# Patient Record
Sex: Female | Born: 1940 | Race: White | Hispanic: No | Marital: Single | Smoking: Never smoker
Health system: Southern US, Community
[De-identification: ages and names within clinical notes are randomized; demographics above are authoritative.]

## PROBLEM LIST (undated history)

## (undated) ENCOUNTER — Emergency Department: Discharge status: Absent without leave | Payer: Medicare Other

## (undated) DIAGNOSIS — K219 Gastro-esophageal reflux disease without esophagitis: Secondary | ICD-10-CM

## (undated) DIAGNOSIS — E119 Type 2 diabetes mellitus without complications: Secondary | ICD-10-CM

## (undated) DIAGNOSIS — G4730 Sleep apnea, unspecified: Secondary | ICD-10-CM

## (undated) DIAGNOSIS — E785 Hyperlipidemia, unspecified: Secondary | ICD-10-CM

## (undated) DIAGNOSIS — I10 Essential (primary) hypertension: Secondary | ICD-10-CM

## (undated) DIAGNOSIS — K759 Inflammatory liver disease, unspecified: Secondary | ICD-10-CM

## (undated) DIAGNOSIS — F32A Depression, unspecified: Secondary | ICD-10-CM

## (undated) DIAGNOSIS — M353 Polymyalgia rheumatica: Secondary | ICD-10-CM

## (undated) DIAGNOSIS — F329 Major depressive disorder, single episode, unspecified: Secondary | ICD-10-CM

## (undated) DIAGNOSIS — M1990 Unspecified osteoarthritis, unspecified site: Secondary | ICD-10-CM

## (undated) DIAGNOSIS — N19 Unspecified kidney failure: Secondary | ICD-10-CM

## (undated) DIAGNOSIS — I639 Cerebral infarction, unspecified: Secondary | ICD-10-CM

## (undated) HISTORY — DX: Unspecified osteoarthritis, unspecified site: M19.90

## (undated) HISTORY — DX: Inflammatory liver disease, unspecified: K75.9

## (undated) HISTORY — DX: Sleep apnea, unspecified: G47.30

## (undated) HISTORY — DX: Major depressive disorder, single episode, unspecified: F32.9

## (undated) HISTORY — DX: Type 2 diabetes mellitus without complications: E11.9

## (undated) HISTORY — DX: Cerebral infarction, unspecified: I63.9

## (undated) HISTORY — DX: Essential (primary) hypertension: I10

## (undated) HISTORY — PX: BILATERAL SALPINGOOPHORECTOMY: SHX1223

## (undated) HISTORY — PX: TONSILLECTOMY AND ADENOIDECTOMY: SUR1326

## (undated) HISTORY — PX: ABDOMINAL HYSTERECTOMY: SHX81

## (undated) HISTORY — PX: ESOPHAGEAL DILATION: SHX303

## (undated) HISTORY — DX: Unspecified kidney failure: N19

## (undated) HISTORY — DX: Depression, unspecified: F32.A

## (undated) HISTORY — PX: OOPHORECTOMY: SHX86

## (undated) HISTORY — PX: REPLACEMENT TOTAL KNEE: SUR1224

---

## 2001-08-28 HISTORY — PX: REDUCTION MAMMAPLASTY: SUR839

## 2010-03-23 ENCOUNTER — Ambulatory Visit: Payer: Self-pay | Admitting: Gastroenterology

## 2010-05-04 ENCOUNTER — Ambulatory Visit: Payer: Self-pay | Admitting: Family Medicine

## 2010-05-28 ENCOUNTER — Ambulatory Visit: Payer: Self-pay | Admitting: Family Medicine

## 2010-07-14 ENCOUNTER — Ambulatory Visit: Payer: Self-pay

## 2010-07-28 ENCOUNTER — Ambulatory Visit: Payer: Self-pay

## 2010-08-28 ENCOUNTER — Ambulatory Visit: Payer: Self-pay

## 2010-09-14 ENCOUNTER — Ambulatory Visit: Payer: Self-pay | Admitting: General Practice

## 2010-09-16 ENCOUNTER — Ambulatory Visit: Payer: Self-pay | Admitting: Family Medicine

## 2010-09-23 ENCOUNTER — Ambulatory Visit: Payer: Self-pay | Admitting: Family Medicine

## 2010-09-29 ENCOUNTER — Ambulatory Visit: Payer: Self-pay | Admitting: Family Medicine

## 2010-10-06 ENCOUNTER — Ambulatory Visit: Payer: Self-pay | Admitting: Family Medicine

## 2010-10-27 ENCOUNTER — Ambulatory Visit: Payer: Self-pay | Admitting: Family Medicine

## 2010-12-04 ENCOUNTER — Emergency Department: Payer: Self-pay | Admitting: Unknown Physician Specialty

## 2011-05-16 ENCOUNTER — Encounter: Payer: Self-pay | Admitting: Orthopaedic Surgery

## 2011-05-30 ENCOUNTER — Encounter: Payer: Self-pay | Admitting: Orthopaedic Surgery

## 2011-06-07 ENCOUNTER — Ambulatory Visit: Payer: Self-pay | Admitting: Internal Medicine

## 2011-06-29 ENCOUNTER — Encounter: Payer: Self-pay | Admitting: Orthopaedic Surgery

## 2011-09-29 ENCOUNTER — Ambulatory Visit: Payer: Self-pay | Admitting: Gastroenterology

## 2011-10-10 ENCOUNTER — Ambulatory Visit: Payer: Self-pay | Admitting: Gastroenterology

## 2012-02-20 ENCOUNTER — Ambulatory Visit: Payer: Self-pay | Admitting: Gastroenterology

## 2012-03-22 ENCOUNTER — Ambulatory Visit: Payer: Self-pay | Admitting: Gastroenterology

## 2012-04-19 ENCOUNTER — Ambulatory Visit: Payer: Self-pay | Admitting: Orthopedic Surgery

## 2012-07-30 ENCOUNTER — Encounter: Payer: Self-pay | Admitting: Orthopedic Surgery

## 2012-08-26 ENCOUNTER — Ambulatory Visit: Payer: Self-pay | Admitting: Family Medicine

## 2012-08-27 ENCOUNTER — Emergency Department: Payer: Self-pay | Admitting: Emergency Medicine

## 2012-08-27 LAB — URINALYSIS, COMPLETE
Bacteria: NONE SEEN
Glucose,UR: NEGATIVE mg/dL (ref 0–75)
Hyaline Cast: 13
Ketone: NEGATIVE
Nitrite: POSITIVE
RBC,UR: 1 /HPF (ref 0–5)
Specific Gravity: 1.024 (ref 1.003–1.030)
WBC UR: 2 /HPF (ref 0–5)

## 2012-08-27 LAB — COMPREHENSIVE METABOLIC PANEL
Alkaline Phosphatase: 94 U/L (ref 50–136)
BUN: 42 mg/dL — ABNORMAL HIGH (ref 7–18)
Bilirubin,Total: 0.6 mg/dL (ref 0.2–1.0)
Calcium, Total: 9.9 mg/dL (ref 8.5–10.1)
Chloride: 102 mmol/L (ref 98–107)
Co2: 28 mmol/L (ref 21–32)
Creatinine: 2.13 mg/dL — ABNORMAL HIGH (ref 0.60–1.30)
EGFR (Non-African Amer.): 22 — ABNORMAL LOW
Osmolality: 290 (ref 275–301)
Potassium: 4.5 mmol/L (ref 3.5–5.1)
Sodium: 138 mmol/L (ref 136–145)

## 2012-08-27 LAB — CBC
HGB: 12.5 g/dL (ref 12.0–16.0)
MCH: 27.3 pg (ref 26.0–34.0)
MCHC: 32.7 g/dL (ref 32.0–36.0)
MCV: 84 fL (ref 80–100)
Platelet: 423 10*3/uL (ref 150–440)

## 2012-08-28 ENCOUNTER — Encounter: Payer: Self-pay | Admitting: Orthopedic Surgery

## 2012-10-01 ENCOUNTER — Encounter: Payer: Self-pay | Admitting: Orthopedic Surgery

## 2012-10-23 ENCOUNTER — Ambulatory Visit: Payer: Self-pay | Admitting: Family Medicine

## 2012-10-26 ENCOUNTER — Encounter: Payer: Self-pay | Admitting: Orthopedic Surgery

## 2012-11-09 ENCOUNTER — Emergency Department: Payer: Self-pay | Admitting: Emergency Medicine

## 2012-11-10 LAB — URINALYSIS, COMPLETE
Bacteria: NONE SEEN
Glucose,UR: NEGATIVE mg/dL (ref 0–75)
Ketone: NEGATIVE
Leukocyte Esterase: NEGATIVE
Nitrite: NEGATIVE
Protein: 30
Squamous Epithelial: 1
WBC UR: 2 /HPF (ref 0–5)

## 2012-11-10 LAB — COMPREHENSIVE METABOLIC PANEL
Alkaline Phosphatase: 84 U/L (ref 50–136)
Anion Gap: 8 (ref 7–16)
Bilirubin,Total: 0.5 mg/dL (ref 0.2–1.0)
Calcium, Total: 8.3 mg/dL — ABNORMAL LOW (ref 8.5–10.1)
Chloride: 99 mmol/L (ref 98–107)
Co2: 26 mmol/L (ref 21–32)
Creatinine: 1.4 mg/dL — ABNORMAL HIGH (ref 0.60–1.30)
EGFR (African American): 43 — ABNORMAL LOW
EGFR (Non-African Amer.): 37 — ABNORMAL LOW
Glucose: 203 mg/dL — ABNORMAL HIGH (ref 65–99)
Osmolality: 278 (ref 275–301)
SGOT(AST): 22 U/L (ref 15–37)
SGPT (ALT): 21 U/L (ref 12–78)
Sodium: 133 mmol/L — ABNORMAL LOW (ref 136–145)
Total Protein: 6.7 g/dL (ref 6.4–8.2)

## 2012-11-10 LAB — CBC
HCT: 32.3 % — ABNORMAL LOW (ref 35.0–47.0)
HGB: 10.8 g/dL — ABNORMAL LOW (ref 12.0–16.0)
MCH: 29.5 pg (ref 26.0–34.0)
MCHC: 33.4 g/dL (ref 32.0–36.0)
MCV: 88 fL (ref 80–100)
WBC: 8.4 10*3/uL (ref 3.6–11.0)

## 2012-11-10 LAB — CK TOTAL AND CKMB (NOT AT ARMC)
CK, Total: 259 U/L — ABNORMAL HIGH (ref 21–215)
CK-MB: 3.4 ng/mL (ref 0.5–3.6)

## 2012-12-27 ENCOUNTER — Ambulatory Visit: Payer: Self-pay | Admitting: Rheumatology

## 2013-04-09 ENCOUNTER — Ambulatory Visit: Payer: Self-pay | Admitting: Rheumatology

## 2013-05-21 ENCOUNTER — Ambulatory Visit: Payer: Self-pay | Admitting: Vascular Surgery

## 2014-03-06 ENCOUNTER — Ambulatory Visit: Payer: Self-pay | Admitting: Family Medicine

## 2014-03-16 ENCOUNTER — Ambulatory Visit: Payer: Self-pay | Admitting: Family Medicine

## 2014-05-25 ENCOUNTER — Ambulatory Visit: Payer: Self-pay | Admitting: Nephrology

## 2014-06-29 ENCOUNTER — Ambulatory Visit: Payer: Self-pay | Admitting: Gastroenterology

## 2014-10-23 ENCOUNTER — Ambulatory Visit: Payer: Self-pay | Admitting: Family Medicine

## 2014-12-11 ENCOUNTER — Other Ambulatory Visit: Payer: Self-pay | Admitting: Family Medicine

## 2014-12-11 DIAGNOSIS — N63 Unspecified lump in unspecified breast: Secondary | ICD-10-CM

## 2015-03-19 ENCOUNTER — Other Ambulatory Visit: Payer: Self-pay

## 2015-03-19 ENCOUNTER — Ambulatory Visit: Payer: Self-pay | Attending: Family Medicine

## 2015-05-11 ENCOUNTER — Other Ambulatory Visit: Payer: Self-pay | Admitting: Gastroenterology

## 2015-05-11 DIAGNOSIS — R197 Diarrhea, unspecified: Secondary | ICD-10-CM

## 2015-05-11 DIAGNOSIS — K529 Noninfective gastroenteritis and colitis, unspecified: Secondary | ICD-10-CM

## 2015-05-13 ENCOUNTER — Ambulatory Visit
Admission: RE | Admit: 2015-05-13 | Discharge: 2015-05-13 | Disposition: A | Discharge status: Home / Self Care | Payer: Medicare Other | Source: Ambulatory Visit | Attending: Gastroenterology | Admitting: Gastroenterology

## 2015-05-13 DIAGNOSIS — R194 Change in bowel habit: Secondary | ICD-10-CM | POA: Insufficient documentation

## 2015-05-13 DIAGNOSIS — K824 Cholesterolosis of gallbladder: Secondary | ICD-10-CM | POA: Diagnosis not present

## 2015-05-13 DIAGNOSIS — R197 Diarrhea, unspecified: Secondary | ICD-10-CM

## 2015-05-13 DIAGNOSIS — N281 Cyst of kidney, acquired: Secondary | ICD-10-CM | POA: Insufficient documentation

## 2015-07-23 ENCOUNTER — Emergency Department
Admission: EM | Admit: 2015-07-23 | Discharge: 2015-07-23 | Disposition: A | Discharge status: Home / Self Care | Payer: Medicare Other

## 2015-11-03 ENCOUNTER — Ambulatory Visit: Payer: Medicare Other | Attending: Neurology | Admitting: Physical Therapy

## 2015-11-03 ENCOUNTER — Encounter: Payer: Self-pay | Admitting: Physical Therapy

## 2015-11-03 DIAGNOSIS — R531 Weakness: Secondary | ICD-10-CM

## 2015-11-03 DIAGNOSIS — Z9181 History of falling: Secondary | ICD-10-CM | POA: Insufficient documentation

## 2015-11-03 DIAGNOSIS — R269 Unspecified abnormalities of gait and mobility: Secondary | ICD-10-CM | POA: Insufficient documentation

## 2015-11-04 ENCOUNTER — Encounter: Payer: Self-pay | Admitting: Physical Therapy

## 2015-11-04 NOTE — Therapy (Signed)
[place] MAIN [facility] SERVICES [place], [place], [address] Phone: [phone]   Fax:  [phone]  Physical Therapy Evaluation  Patient Details  Name: Holly Conner MRN: [id number] Date of Birth: 07/13/1941 Referring Provider: Gurney Maxin MD  Encounter Date: 11/03/2015      PT End of Session - 11/04/15 1745    Visit Number 1   Number of Visits 17   Date for PT Re-Evaluation 12/30/15   Authorization Type gcode 1   Authorization Time Period 10   PT Start Time 1610   PT Stop Time 1700   PT Time Calculation (min) 50 min   Activity Tolerance Patient tolerated treatment well   Behavior During Therapy [facility] for tasks assessed/performed      Past Medical History  Diagnosis Date  . Depression     controlled  . Hypertension     somewhat controlled  . 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 No pertinent past surgical history.  There were no vitals filed for this visit.  Visit Diagnosis:  Abnormality of gait - Plan: PT plan of care cert/re-cert  History of fall - Plan: PT plan of care cert/re-cert  Weakness - Plan: PT plan of care cert/re-cert      Subjective Assessment - 11/03/15 1620    Subjective 75 yo Female reports increased history of falls and shaking; She reports that her last fall occurred when her blood sugar dropped to 65 last friday (10/29/15); She reports that currently she is shaking but reports that her blood sugar was 135; Patient reports that her legs just give out and she falls down; She denies any numbness/tingling; She reports sometimes having dizziness; Patient is not using an assistive device at this time; She is concerned about using a cane;    Pertinent History personal factors affecting rehab: back pain chronic, kidney failure (stage IV);    Limitations Standing;Walking   How long can you sit  comfortably? 2+ hours   How long can you stand comfortably? 5 min due to back pain    How long can you walk comfortably? unsure, not long;    Patient Stated Goals was walking in the neighborhood and is wanting to get back to that; Be able to do self care ADLs without falling;    Currently in Pain? Yes   Pain Score 6    Pain Location Back   Pain Orientation Lower   Pain Descriptors / Indicators Aching;Sore   Pain Type Chronic pain   Aggravating Factors  standing/walking;    Pain Relieving Factors rest;             [facility] PT Assessment - 11/04/15 0001    Assessment   Medical Diagnosis Frequent falls/impaired balance   Referring Provider Gurney Maxin MD   Onset Date/Surgical Date 10/27/14   Hand Dominance Right   Next MD Visit not scheduled;    Prior Therapy has had PT in the past for past knee surgery; denies any PT for this current condition;    Precautions   Precautions Fall   Restrictions   Weight Bearing Restrictions No   Balance Screen   Has the patient fallen in the past 6 months Yes   How many times? 5   Has the patient had a decrease in activity level because of a fear of falling?  No   Is the patient reluctant to leave their home because of a fear of falling?  No   Home Environment   Additional Comments lives in single story home with 2 steps to enter; no railing ;holds onto doorway column; Patient reports living alone currently;    Prior Function   Level of Independence Independent;Independent with gait;Independent with transfers   Vocation Retired   Comptroller, shopping, spend time with friends;    Cognition   Overall Cognitive Status Within Functional Limits for tasks assessed   Observation/Other Assessments   Skin Integrity intact by gross assessment;    Sensation   Light Touch Appears Intact   Coordination   Gross Motor Movements are Fluid and Coordinated Yes   Fine Motor Movements are Fluid and Coordinated Yes   Posture/Postural Control    Posture Comments demonstrates mild slumped posture with unsupported sitting; able to self correct with verbal cues;    AROM   Overall AROM Comments BUE and BLE AROM is WFL;    Strength   Overall Strength Comments BUE grossly 4/5; BLE grossly 4/5   Transfers   Comments patient able to transfer sit<>stand without HHA but is slower with transfer;    Ambulation/Gait   Gait Comments ambulates without AD, wide base of support, decreased step length, decreased DF at heel strike increased lateral trunk sway with decreased pelvic rotation; slower gait speed;    Standardized Balance Assessment   Five times sit to stand comments  25 sec without HHA; >15 sec indicates high risk for falls;   10 Meter Walk 0.5 m/s without AD at comfortable pace; 0.76 m/s without AD at fast pace (limited home and community ambulator, at risk for falls)   [employer]   Sit to Stand Able to stand without using hands and stabilize independently   Standing Unsupported Able to stand 2 minutes with supervision   Sitting with Back Unsupported but Feet Supported on Floor or Stool Able to sit safely and securely 2 minutes   Stand to Sit Sits safely with minimal use of hands   Transfers Able to transfer safely, definite need of hands   Standing Unsupported with Eyes Closed Able to stand 10 seconds with supervision   Standing Ubsupported with Feet Together Able to place feet together independently and stand for 1 minute with supervision   From Standing, Reach Forward with Outstretched Arm Can reach forward >12 cm safely (5")   From Standing Position, Pick up Object from Floor Able to pick up shoe, needs supervision   From Standing Position, Turn to Look Behind Over each Shoulder Looks behind from both sides and weight shifts well   Turn 360 Degrees Able to turn 360 degrees safely but slowly   Standing Unsupported, Alternately Place Feet on Step/Stool Able to complete 4 steps without aid or supervision   Standing Unsupported,  One Foot in [employer] balance while stepping or standing   Standing on One Leg Tries to lift leg/unable to hold 3 seconds but remains standing independently   Total Score 39   Berg comment: 37-45 indicates significant risk for falls 80%                           PT Education - 11/04/15 1745    Education provided Yes   Education Details findings, recommendations for rehab   Person(s) Educated Patient   Methods Explanation   Comprehension Verbalized understanding  PT Long Term Goals - 11-12-15 1748    PT LONG TERM GOAL #1   Title Patient will be independent in home exercise program to improve strength/mobility for better functional independence with ADLs by 12/30/15   Time 8   Period Weeks   Status New   PT LONG TERM GOAL #2   Title Patient (> 14 years old) will complete five times sit to stand test in < 15 seconds indicating an increased LE strength and improved balance. by 12/30/15   Time 8   Period Weeks   Status New   PT LONG TERM GOAL #3   Title Patient will increase Berg Balance score by > 6 points to demonstrate decreased fall risk during functional activities. by 12/30/15   Time 8   Period Weeks   Status New   PT LONG TERM GOAL #4   Title Patient will increase 10 meter walk test to >1.82m/s as to improve gait speed for better community ambulation and to reduce fall risk. by 12/30/15   Time 8   Period Weeks   Status New   PT LONG TERM GOAL #5   Title Patient will increase BLE gross strength to 4+/5 as to improve functional strength for independent gait, increased standing tolerance and increased ADL ability. by 12/30/15   Time 8   Period Weeks   Status New               Plan - 2015/11/12 1746    Clinical Impression Statement 75 yo Female presents to therapy with increased history of falls. She reports having increased episodes of "shaking" with instability. Patient exhibits weakness in BUE/BLE with decreased mobility. She tested as a  high fall risk. Patient would benefit from additional skilled PT intervention to improve balance/gait safety and reduce risk for falls;    Pt will benefit from skilled therapeutic intervention in order to improve on the following deficits Abnormal gait;Decreased endurance;Obesity;Cardiopulmonary status limiting activity;Decreased activity tolerance;Decreased strength;Decreased knowledge of use of DME;Pain;Difficulty walking;Decreased mobility;Decreased balance;Decreased range of motion;Improper body mechanics;Postural dysfunction;Impaired flexibility;Decreased safety awareness   Rehab Potential Fair   Clinical Impairments Affecting Rehab Potential positive: Motivated; negative: chronic pain, co-morbidities etc; Patient's current presentation is evolving as she has episodes of shaking with multiple falls in last few months. She also has multiple co-morbidities which affect rehab tolerance including stage IV kidney failure, chronic back pain and uncontrolled diabetes;    PT Frequency 2x / week   PT Duration 8 weeks   PT Treatment/Interventions ADLs/Self Care Home Management;Cryotherapy;Electrical Stimulation;Moist Heat;Balance training;Therapeutic exercise;Therapeutic activities;Functional mobility training;Stair training;Gait training;DME Instruction;Neuromuscular re-education;Patient/family education;Manual techniques;Taping;Energy conservation   PT Next Visit Plan initiate balance HEP   PT Home Exercise Plan will initiate next visit;    Consulted and Agree with Plan of Care Patient          G-Codes - 11/12/15 1750    Functional Assessment Tool Used 10 meter walk, 5 times sit<>stand, strength, clinical judgement;    Functional Limitation Mobility: Walking and moving around   Mobility: Walking and Moving Around Current Status [phone]) At least 40 percent but less than 60 percent impaired, limited or restricted   Mobility: Walking and Moving Around Goal Status [phone]) At least 1 percent but less than  20 percent impaired, limited or restricted       Problem List There are no active problems to display for this patient.   Tachina Spoonemore PT, DPT 11-12-2015, 5:51 PM  [place] [facility]  [place], [place], [address] Phone: [phone]   Fax:  [phone]  Name: Holly Conner MRN: [id number] Date of Birth: 1941/08/04

## 2015-11-09 ENCOUNTER — Ambulatory Visit: Payer: Medicare Other | Admitting: Physical Therapy

## 2015-11-11 ENCOUNTER — Ambulatory Visit: Payer: Medicare Other | Admitting: Physical Therapy

## 2015-11-11 ENCOUNTER — Encounter: Payer: Medicare Other | Admitting: Occupational Therapy

## 2015-11-16 ENCOUNTER — Ambulatory Visit: Payer: Medicare Other | Admitting: Physical Therapy

## 2015-11-18 ENCOUNTER — Ambulatory Visit: Payer: Medicare Other | Admitting: Physical Therapy

## 2015-11-23 ENCOUNTER — Ambulatory Visit: Payer: Medicare Other | Admitting: Physical Therapy

## 2015-11-23 ENCOUNTER — Encounter: Payer: Self-pay | Admitting: Physical Therapy

## 2015-11-23 DIAGNOSIS — Z9181 History of falling: Secondary | ICD-10-CM

## 2015-11-23 DIAGNOSIS — R269 Unspecified abnormalities of gait and mobility: Secondary | ICD-10-CM | POA: Diagnosis not present

## 2015-11-23 DIAGNOSIS — R531 Weakness: Secondary | ICD-10-CM

## 2015-11-23 NOTE — Patient Instructions (Signed)
SIT TO STAND: No Device   Sit with feet shoulder-width apart, on floor.(Make sure that you are in a chair that won't move like a chair against a wall or couch etc) Lean chest forward, raise hips up from surface. Straighten hips and knees. Weight bear equally on left and right sides. 10___ reps per set, _2__ sets per day, _5__ days per week Place left leg closer to sitting surface.  Copyright  VHI. All rights reserved.  Backward Walking   Walk backward, toes of each foot coming down first. Take long, even strides. Make sure you have a clear pathway with no obstructions when you do this. Stand beside counter and walk backward  And then walk forward doing opposite directions; repeat 10 laps 2x a day at least 5 days a week.  Copyright  VHI. All rights reserved.  Tandem Walking   Stand beside kitchen sink and place one foot in front of the other, lift your hand and try to hold position for 10 sec. Repeat with other foot in front; Repeat 5 reps with each foot in front 5 days a week.Balance: Unilateral   Attempt to balance on left leg, eyes open. Hold _5-10___ seconds.Start with holding onto counter and if you get your balance you can try to let go of counter. Repeat __5__ times per set. Do __1__ sets per session. Do __1__ sessions per day. Keep eyes open:   http://orth.exer.us/29   Copyright  VHI. All rights reserved.

## 2015-11-24 NOTE — Therapy (Addendum)
[place] MAIN [facility] SERVICES [address] [place], [place], [address] Phone: [phone]   Fax:  [phone]  Physical Therapy Treatment  Patient Details  Name: Holly Conner MRN: [id number] Date of Birth: 1941-08-24 Referring Provider: Gurney Maxin MD  Encounter Date: 11/23/2015      PT End of Session - 11/23/15 1528    Visit Number 2   Number of Visits 17   Date for PT Re-Evaluation 12/30/15   Authorization Type gcode 2   Authorization Time Period 10   PT Start Time 1515   PT Stop Time 1600   PT Time Calculation (min) 45 min   Equipment Utilized During Treatment Gait belt   Activity Tolerance Patient tolerated treatment well   Behavior During Therapy [facility] for tasks assessed/performed      Past Medical History  Diagnosis Date  . Depression     controlled  . Hypertension     somewhat controlled  . 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 No pertinent past surgical history.  There were no vitals filed for this visit.  Visit Diagnosis:  Abnormality of gait  History of fall  Weakness      Subjective Assessment - 11/23/15 1526    Subjective Patient reports having increased back pain with initial waking and walking in the morning. She presents to therapy with SPC needing adjustment for safety;    Pertinent History personal factors affecting rehab: back pain chronic, kidney failure (stage IV);    Limitations Standing;Walking   How long can you sit comfortably? 2+ hours   How long can you stand comfortably? 5 min due to back pain    How long can you walk comfortably? unsure, not long;    Patient Stated Goals was walking in the neighborhood and is wanting to get back to that; Be able to do self care ADLs without falling;    Currently in Pain? Yes   Pain Score 7    Pain Location Back   Pain Orientation Lower   Pain  Descriptors / Indicators Aching;Sore   Pain Type Chronic pain         TREATMENT: Warm up on Nustep BUE/BLE level 2 x5 min (Unbilled);  PT initiated HEP: SLS on firm surface with 1-0 rail assist 10 sec hold x3 each LE; Tandem stance on firm surface, 1-0 rail assist 10 sec hold x4 each foot in front; Forward/backward walking without rail assist x10 feet x3 laps; Sit<>Stand from regular chair without rail assist x10 reps;  Patient required min VCs for balance stability, including to increase trunk control for less loss of balance with smaller base of support PT adjusted patient's SPC for better height to improve gait safety; Patient required mod VCs to increase erect posture for better gaze stabilization with balance tasks.  Resisted gait, 12.5# forward/backward walking x2 laps, side stepping 12.5# x2 laps with min A for balance and mod VCs to increase step length for better gait safety; Also required min VCs to improve weight shift to increase balance control;                         PT Education - 11/23/15 1527    Education provided Yes   Education Details HEP initiated, balance exercise   Person(s) Educated Patient   Methods Explanation;Verbal cues  Comprehension Verbalized understanding;Returned demonstration;Verbal cues required             PT Long Term Goals - 11/04/15 1748    PT LONG TERM GOAL #1   Title Patient will be independent in home exercise program to improve strength/mobility for better functional independence with ADLs by 12/30/15   Time 8   Period Weeks   Status New   PT LONG TERM GOAL #2   Title Patient (> 29 years old) will complete five times sit to stand test in < 15 seconds indicating an increased LE strength and improved balance. by 12/30/15   Time 8   Period Weeks   Status New   PT LONG TERM GOAL #3   Title Patient will increase Berg Balance score by > 6 points to demonstrate decreased fall risk during functional activities. by  12/30/15   Time 8   Period Weeks   Status New   PT LONG TERM GOAL #4   Title Patient will increase 10 meter walk test to >1.43m/s as to improve gait speed for better community ambulation and to reduce fall risk. by 12/30/15   Time 8   Period Weeks   Status New   PT LONG TERM GOAL #5   Title Patient will increase BLE gross strength to 4+/5 as to improve functional strength for independent gait, increased standing tolerance and increased ADL ability. by 12/30/15   Time 8   Period Weeks   Status New               Plan - 11/24/15 1632    Clinical Impression Statement Instructed patient in advanced balance exercise. She was able to demonstrate better stance control with cues for better posture and abdominal stabilization. Patient continues to have decreased stability with higher level balance tasks. Would benefit from additional skilled PT Intervention to improve dynamic balance and reduce fall risk; Patient is concerned about chronic back pain; PT informed patient she would need a referral to address back discomfort;    Pt will benefit from skilled therapeutic intervention in order to improve on the following deficits Abnormal gait;Decreased endurance;Obesity;Cardiopulmonary status limiting activity;Decreased activity tolerance;Decreased strength;Decreased knowledge of use of DME;Pain;Difficulty walking;Decreased mobility;Decreased balance;Decreased range of motion;Improper body mechanics;Postural dysfunction;Impaired flexibility;Decreased safety awareness   Rehab Potential Fair   Clinical Impairments Affecting Rehab Potential positive: Motivated; negative: chronic pain, co-morbidities etc; Patient's current presentation is evolving as she has episodes of shaking with multiple falls in last few months. She also has multiple co-morbidities which affect rehab tolerance including stage IV kidney failure, chronic back pain and uncontrolled diabetes;    PT Frequency 2x / week   PT Duration 8 weeks    PT Treatment/Interventions ADLs/Self Care Home Management;Cryotherapy;Electrical Stimulation;Moist Heat;Balance training;Therapeutic exercise;Therapeutic activities;Functional mobility training;Stair training;Gait training;DME Instruction;Neuromuscular re-education;Patient/family education;Manual techniques;Taping;Energy conservation   PT Next Visit Plan initiate balance HEP   PT Home Exercise Plan will initiate next visit;    Consulted and Agree with Plan of Care Patient        Problem List There are no active problems to display for this patient.   Latecia Miler PT, DPT 11/24/2015, 5:41 PM  [place] MAIN [facility] SERVICES [address] [place], [place], [address] Phone: [phone]   Fax:  [phone]  Name: Holly Conner MRN: [id number] Date of Birth: 06/12/1941

## 2015-11-25 ENCOUNTER — Encounter: Payer: Self-pay | Admitting: Physical Therapy

## 2015-11-25 ENCOUNTER — Ambulatory Visit: Payer: Medicare Other | Admitting: Physical Therapy

## 2015-11-25 DIAGNOSIS — Z9181 History of falling: Secondary | ICD-10-CM

## 2015-11-25 DIAGNOSIS — R269 Unspecified abnormalities of gait and mobility: Secondary | ICD-10-CM | POA: Diagnosis not present

## 2015-11-25 DIAGNOSIS — R531 Weakness: Secondary | ICD-10-CM

## 2015-11-25 NOTE — Therapy (Signed)
[place] MAIN [facility] SERVICES [address] [place], [place], [address] Phone: [phone]   Fax:  [phone]  Physical Therapy Treatment  Patient Details  Name: Holly Conner MRN: [id number] Date of Birth: 06-27-1941 Referring Provider: Gurney Maxin MD  Encounter Date: 11/25/2015      PT End of Session - 11/25/15 1700    Visit Number 3   Number of Visits 17   Date for PT Re-Evaluation 12/30/15   Authorization Type gcode [id number]   Authorization Time Period 10   PT Start Time 1535   PT Stop Time 1615   PT Time Calculation (min) 40 min   Equipment Utilized During Treatment Gait belt   Activity Tolerance Patient tolerated treatment well   Behavior During Therapy [facility] for tasks assessed/performed      Past Medical History  Diagnosis Date  . Depression     controlled  . Hypertension     somewhat controlled  . 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 No pertinent past surgical history.  There were no vitals filed for this visit.  Visit Diagnosis:  Abnormality of gait  History of fall  Weakness      Subjective Assessment - 11/25/15 1542    Subjective Patient was late to appointment; "I am sorry. I sold my car and I had to go by the bank and its just been a busy afternoon." Patient reports having a little soreness in back, legs and arms but no severe pain; She reports doing her HEP but had difficulty with tandem stance;    Pertinent History personal factors affecting rehab: back pain chronic, kidney failure (stage IV);    Limitations Standing;Walking   How long can you sit comfortably? 2+ hours   How long can you stand comfortably? 5 min due to back pain    How long can you walk comfortably? unsure, not long;    Patient Stated Goals was walking in the neighborhood and is wanting to get back to that; Be able to do self care  ADLs without falling;    Currently in Pain? Yes   Pain Score 2    Pain Location Back   Pain Orientation Lower   Pain Descriptors / Indicators Aching;Sore   Pain Type Chronic pain         TREATMENT:  Warm up on Nustep BUE/BLE level 2 x4 min (unbilled);  Instructed patient in extensive gait training: Gait around gym with [place] in [place] with cues to improve 2 point gait pattern x200 feet; Patient had difficulty with RUE timing despite mod-max verbal/tactile cues; Gait with BUE holding canes, and PT behind patient working on timing UE arm swing with foot step x200 feet; Patient reports not being able to correctly initiate arm swing with LE step; Gait with BUE holding walking poles x200 feet with cues to improve opposite UE movement with opposite LE; Patient had difficulty with timing UE movement with steps; Patient is quicker with LE step and slower with UE movement; Gait with RUE holding SPC, x200 feet with max Verbal cues for cane placement and sequencing; She was able to demonstrate partial correct sequencing approximately 50% of time requiring frequent cues to stop and start over as patient would often take 3 steps prior to moving cane due to slower UE movement against faster steps; PT educated patient in ways  to improve gait sequencing at home including to slow down LE movement, increase talking out load for verbal cues;  Patient would benefit from additional skilled PT Intervention to improve gait safety and balance;                          PT Education - 11/25/15 1700    Education provided Yes   Education Details gait safety   Person(s) Educated Patient   Methods Explanation;Verbal cues;Tactile cues   Comprehension Verbalized understanding;Returned demonstration;Verbal cues required;Tactile cues required             PT Long Term Goals - 11/04/15 1748    PT LONG TERM GOAL #1   Title Patient will be independent in home exercise program to improve  strength/mobility for better functional independence with ADLs by 12/30/15   Time 8   Period Weeks   Status New   PT LONG TERM GOAL #2   Title Patient (> 26 years old) will complete five times sit to stand test in < 15 seconds indicating an increased LE strength and improved balance. by 12/30/15   Time 8   Period Weeks   Status New   PT LONG TERM GOAL #3   Title Patient will increase Berg Balance score by > 6 points to demonstrate decreased fall risk during functional activities. by 12/30/15   Time 8   Period Weeks   Status New   PT LONG TERM GOAL #4   Title Patient will increase 10 meter walk test to >1.48m/s as to improve gait speed for better community ambulation and to reduce fall risk. by 12/30/15   Time 8   Period Weeks   Status New   PT LONG TERM GOAL #5   Title Patient will increase BLE gross strength to 4+/5 as to improve functional strength for independent gait, increased standing tolerance and increased ADL ability. by 12/30/15   Time 8   Period Weeks   Status New               Plan - 11/25/15 1700    Clinical Impression Statement Instructed patient in gait safety with SPC; She required max VCs and tactile cues for sequencing and safe cane use. Patient had difficulty with timing of cane for 2 point gait pattern and would benefit from additional skilled PT intervention to improve gait safety and reduce fall risk;    Pt will benefit from skilled therapeutic intervention in order to improve on the following deficits Abnormal gait;Decreased endurance;Obesity;Cardiopulmonary status limiting activity;Decreased activity tolerance;Decreased strength;Decreased knowledge of use of DME;Pain;Difficulty walking;Decreased mobility;Decreased balance;Decreased range of motion;Improper body mechanics;Postural dysfunction;Impaired flexibility;Decreased safety awareness   Rehab Potential Fair   Clinical Impairments Affecting Rehab Potential positive: Motivated; negative: chronic pain,  co-morbidities etc; Patient's current presentation is evolving as she has episodes of shaking with multiple falls in last few months. She also has multiple co-morbidities which affect rehab tolerance including stage IV kidney failure, chronic back pain and uncontrolled diabetes;    PT Frequency 2x / week   PT Duration 8 weeks   PT Treatment/Interventions ADLs/Self Care Home Management;Cryotherapy;Electrical Stimulation;Moist Heat;Balance training;Therapeutic exercise;Therapeutic activities;Functional mobility training;Stair training;Gait training;DME Instruction;Neuromuscular re-education;Patient/family education;Manual techniques;Taping;Energy conservation   PT Next Visit Plan initiate balance HEP   PT Home Exercise Plan educated patient in gait training;    Consulted and Agree with Plan of Care Patient        Problem List There are no active problems to  display for this patient.   Sherrill Mckamie PT, DPT 11/25/2015, 5:02 PM  [place] MAIN [facility] SERVICES [address] [place], [place], [address] Phone: [phone]   Fax:  [phone]  Name: Holly Conner MRN: [id number] Date of Birth: 03-Sep-1940

## 2015-11-30 ENCOUNTER — Ambulatory Visit: Payer: Medicare Other | Admitting: Physical Therapy

## 2015-12-02 ENCOUNTER — Encounter: Payer: Self-pay | Admitting: Physical Therapy

## 2015-12-02 ENCOUNTER — Ambulatory Visit: Payer: Medicare Other | Attending: Neurology | Admitting: Physical Therapy

## 2015-12-02 DIAGNOSIS — R2681 Unsteadiness on feet: Secondary | ICD-10-CM | POA: Insufficient documentation

## 2015-12-02 DIAGNOSIS — M6281 Muscle weakness (generalized): Secondary | ICD-10-CM | POA: Insufficient documentation

## 2015-12-02 DIAGNOSIS — R269 Unspecified abnormalities of gait and mobility: Secondary | ICD-10-CM | POA: Diagnosis present

## 2015-12-02 DIAGNOSIS — Z9181 History of falling: Secondary | ICD-10-CM | POA: Insufficient documentation

## 2015-12-02 DIAGNOSIS — R531 Weakness: Secondary | ICD-10-CM | POA: Insufficient documentation

## 2015-12-02 NOTE — Therapy (Signed)
[place] MAIN [facility] SERVICES [address] [place], [place], [address] Phone: [phone]   Fax:  [phone]  Physical Therapy Treatment  Patient Details  Name: Holly Conner MRN: [id number] Date of Birth: Aug 30, 1940 Referring Provider: Gurney Maxin MD  Encounter Date: 12/02/2015      PT End of Session - 12/02/15 1502    Visit Number 4   Number of Visits 17   Date for PT Re-Evaluation 12/30/15   Authorization Type gcode 4   Authorization Time Period 10   PT Start Time 1430   PT Stop Time 1515   PT Time Calculation (min) 45 min   Equipment Utilized During Treatment Gait belt   Activity Tolerance Patient tolerated treatment well   Behavior During Therapy [facility] for tasks assessed/performed      Past Medical History  Diagnosis Date  . Depression     controlled  . Hypertension     somewhat controlled  . 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 No pertinent past surgical history.  There were no vitals filed for this visit.  Visit Diagnosis:  Abnormality of gait  Weakness  History of fall      Subjective Assessment - 12/02/15 1437    Subjective Patient reports having a history of UE tremors and lots of falls; She reports having increased tremors when putting one foot in front of the other; She is concerned about her low blood sugar and plans to talk with her endocrinologist about it, but she is also concerned about her shaking and lack of coordination;    Pertinent History personal factors affecting rehab: back pain chronic, kidney failure (stage IV);    Limitations Standing;Walking   How long can you sit comfortably? 2+ hours   How long can you stand comfortably? 5 min due to back pain    How long can you walk comfortably? unsure, not long;    Patient Stated Goals was walking in the neighborhood and is wanting to get  back to that; Be able to do self care ADLs without falling;    Currently in Pain? No/denies         TREATMENT: Warm up on Nustep BUE/BLE level 2 x3 min (unbilled);  Re-educated patient in HEP and plan of care;  Tandem stance without rail assist on firm surface 10 sec hold x2 each foot in front; Modified tandem stance with heel to toe but feet not together 10 sec hold x1 each foot in front; SLS on firm surface without rail assist 5 sec hold x3 each LE;  Patient required min VCs for balance stability, including to increase trunk control for less loss of balance with smaller base of support  Standing on airex x2: Feet apart, eyes open/closed 10 sec hold x3 each with cues for weight shift; Feet apart, reaching for ball with BUE side/side ball pass x10 each;  Resisted weighted gait 7.5# forward/backward, side/side 2 way, x2 laps each with min A for balance; She required min Vcs to improve weight shift with eccentric return for increased stance control;                          PT Education - 12/02/15 1502    Education provided Yes   Education Details balance, weight shift;    Person(s) Educated Patient  Methods Explanation;Verbal cues   Comprehension Verbalized understanding;Returned demonstration;Verbal cues required             PT Long Term Goals - 11/04/15 1748    PT LONG TERM GOAL #1   Title Patient will be independent in home exercise program to improve strength/mobility for better functional independence with ADLs by 12/30/15   Time 8   Period Weeks   Status New   PT LONG TERM GOAL #2   Title Patient (> 42 years old) will complete five times sit to stand test in < 15 seconds indicating an increased LE strength and improved balance. by 12/30/15   Time 8   Period Weeks   Status New   PT LONG TERM GOAL #3   Title Patient will increase Berg Balance score by > 6 points to demonstrate decreased fall risk during functional activities. by 12/30/15   Time 8    Period Weeks   Status New   PT LONG TERM GOAL #4   Title Patient will increase 10 meter walk test to >1.22m/s as to improve gait speed for better community ambulation and to reduce fall risk. by 12/30/15   Time 8   Period Weeks   Status New   PT LONG TERM GOAL #5   Title Patient will increase BLE gross strength to 4+/5 as to improve functional strength for independent gait, increased standing tolerance and increased ADL ability. by 12/30/15   Time 8   Period Weeks   Status New               Plan - 12/02/15 1712    Clinical Impression Statement Re-educated patient in balance exercise as part of HEP; Instructed patient in advanced balance exercise on uneven surfaces; She does require min Vcs to improve weight shift for better balance control and to increase erect posture. She would benefit from additional skilled PT intervention to improve LE strength, balance and gait safety;    Pt will benefit from skilled therapeutic intervention in order to improve on the following deficits Abnormal gait;Decreased endurance;Obesity;Cardiopulmonary status limiting activity;Decreased activity tolerance;Decreased strength;Decreased knowledge of use of DME;Pain;Difficulty walking;Decreased mobility;Decreased balance;Decreased range of motion;Improper body mechanics;Postural dysfunction;Impaired flexibility;Decreased safety awareness   Rehab Potential Fair   Clinical Impairments Affecting Rehab Potential positive: Motivated; negative: chronic pain, co-morbidities etc; Patient's current presentation is evolving as she has episodes of shaking with multiple falls in last few months. She also has multiple co-morbidities which affect rehab tolerance including stage IV kidney failure, chronic back pain and uncontrolled diabetes;    PT Frequency 2x / week   PT Duration 8 weeks   PT Treatment/Interventions ADLs/Self Care Home Management;Cryotherapy;Electrical Stimulation;Moist Heat;Balance training;Therapeutic  exercise;Therapeutic activities;Functional mobility training;Stair training;Gait training;DME Instruction;Neuromuscular re-education;Patient/family education;Manual techniques;Taping;Energy conservation   PT Next Visit Plan initiate balance HEP   PT Home Exercise Plan educated patient in gait training;    Consulted and Agree with Plan of Care Patient        Problem List There are no active problems to display for this patient.   Trotter,Margaret PT, DPT 12/02/2015, 5:13 PM  [place] MAIN [facility] SERVICES [address] [place], [place], [address] Phone: [phone]   Fax:  [phone]  Name: Holly Conner MRN: [id number] Date of Birth: [birth year]

## 2015-12-07 ENCOUNTER — Ambulatory Visit: Payer: Medicare Other | Admitting: Physical Therapy

## 2015-12-09 ENCOUNTER — Ambulatory Visit: Payer: Medicare Other | Admitting: Physical Therapy

## 2015-12-14 ENCOUNTER — Ambulatory Visit: Payer: Medicare Other | Admitting: Physical Therapy

## 2015-12-14 ENCOUNTER — Encounter: Payer: Self-pay | Admitting: Physical Therapy

## 2015-12-14 DIAGNOSIS — R2681 Unsteadiness on feet: Secondary | ICD-10-CM

## 2015-12-14 DIAGNOSIS — R269 Unspecified abnormalities of gait and mobility: Secondary | ICD-10-CM | POA: Diagnosis not present

## 2015-12-14 DIAGNOSIS — M6281 Muscle weakness (generalized): Secondary | ICD-10-CM

## 2015-12-14 NOTE — Therapy (Signed)
[place] MAIN [facility] SERVICES [address] [place], [place], [address] Phone: [phone]   Fax:  [phone]  Physical Therapy Treatment  Patient Details  Name: Holly Conner MRN: [id number] Date of Birth: 02-02-1941 Referring Provider: Gurney Maxin MD  Encounter Date: 12/14/2015      PT End of Session - 12/14/15 1452    Visit Number 5   Number of Visits 17   Date for PT Re-Evaluation 01/11/16   Authorization Type gcode 5   Authorization Time Period 10   PT Start Time 1430   PT Stop Time 1515   PT Time Calculation (min) 45 min   Equipment Utilized During Treatment Gait belt   Activity Tolerance Patient tolerated treatment well   Behavior During Therapy [facility] for tasks assessed/performed      Past Medical History  Diagnosis Date  . Depression     controlled  . Hypertension     somewhat controlled  . 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 No pertinent past surgical history.  There were no vitals filed for this visit.      Subjective Assessment - 12/14/15 1442    Subjective Patient reports doing okay; she reports seeing her endocrinologist and reports getting her insulin dose adjusted to help reduce the blood sugar drop after breakfast. Patient reports increased episodes of shakiness;    Pertinent History personal factors affecting rehab: back pain chronic, kidney failure (stage IV);    Limitations Standing;Walking   How long can you sit comfortably? 2+ hours   How long can you stand comfortably? 5 min due to back pain    How long can you walk comfortably? unsure, not long;    Patient Stated Goals was walking in the neighborhood and is wanting to get back to that; Be able to do self care ADLs without falling;    Currently in Pain? No/denies         TREATMENT: Warm up on treadmill 1.5 mph with 2 HHA x3 min with  cues to increase step length and increase erect posture for better gait safety;  Leg press, BLE plate 105# [id number]; BLE leg press, heel raises 105# 2x12; Patient required min VCs to slow down LE movement particularly eccentric return and to keep knees straight during heel raises for better calf strengthening;   Resisted weighted gait 12.5# backward with forward eccentric return, side/side 2 way, x2 laps each with min A for balance; She required min Vcs to improve weight shift with eccentric return for increased stance control;   Standing on airex: Eyes open/closed standing unsupported 10 sec hold x3 each; Standing with feet apart, eyes closed, head turns up/down, side/side x5 each with mod VCs to improve erect posture and avoid leaning side/side; Standing with feet apart, eyes open with BUE ball pass side/side x10 each;  SLS on firm surface 5-10 sec hold x2 each LE;  Patient required min VCs for balance stability, including to increase trunk control for less loss of balance with smaller base of support                             PT Education - 12/14/15 1452    Education provided Yes   Education Details strengthening, balance exercise   Person(s) Educated Patient   Methods Explanation;Verbal cues  Comprehension Verbalized understanding;Returned demonstration;Verbal cues required             PT Long Term Goals - 12/14/15 1516    PT LONG TERM GOAL #1   Title Patient will be independent in home exercise program to improve strength/mobility for better functional independence with ADLs by 12/30/15   Time 8   Period Weeks   Status On-going   PT LONG TERM GOAL #2   Title Patient (> 77 years old) will complete five times sit to stand test in < 15 seconds indicating an increased LE strength and improved balance. by 12/30/15   Time 8   Period Weeks   Status On-going   PT LONG TERM GOAL #3   Title Patient will increase Berg Balance score by > 6 points to  demonstrate decreased fall risk during functional activities. by 12/30/15   Time 8   Period Weeks   Status On-going   PT LONG TERM GOAL #4   Title Patient will increase 10 meter walk test to >1.37m/s as to improve gait speed for better community ambulation and to reduce fall risk. by 12/30/15   Time 8   Period Weeks   Status On-going   PT LONG TERM GOAL #5   Title Patient will increase BLE gross strength to 4+/5 as to improve functional strength for independent gait, increased standing tolerance and increased ADL ability. by 12/30/15   Time 8   Period Weeks   Status On-going               Plan - 12/14/15 1515    Clinical Impression Statement Instructed patient in balance and LE strengthening exercise. She required min VCs for correct exercise technique including to improve weight shift and posture with balance exercise. Patient did get a little "shaky" towards end of session which could be related to fatigue and/or fear of falling with advanced exercise. She would benefit from additional skilled PT Intervention to improve balance/gait safety.    Rehab Potential Fair   Clinical Impairments Affecting Rehab Potential positive: Motivated; negative: chronic pain, co-morbidities etc; Patient's current presentation is evolving as she has episodes of shaking with multiple falls in last few months. She also has multiple co-morbidities which affect rehab tolerance including stage IV kidney failure, chronic back pain and uncontrolled diabetes;    PT Frequency 2x / week   PT Duration 4 weeks   PT Treatment/Interventions ADLs/Self Care Home Management;Cryotherapy;Electrical Stimulation;Moist Heat;Balance training;Therapeutic exercise;Therapeutic activities;Functional mobility training;Stair training;Gait training;DME Instruction;Neuromuscular re-education;Patient/family education;Manual techniques;Taping;Energy conservation   PT Next Visit Plan initiate balance HEP   PT Home Exercise Plan continue as  previously given;    Consulted and Agree with Plan of Care Patient      Patient will benefit from skilled therapeutic intervention in order to improve the following deficits and impairments:  Abnormal gait, Decreased endurance, Obesity, Cardiopulmonary status limiting activity, Decreased activity tolerance, Decreased strength, Decreased knowledge of use of DME, Pain, Difficulty walking, Decreased mobility, Decreased balance, Decreased range of motion, Improper body mechanics, Postural dysfunction, Impaired flexibility, Decreased safety awareness  Visit Diagnosis: Unsteadiness on feet - Plan: PT plan of care cert/re-cert  Muscle weakness (generalized) - Plan: PT plan of care cert/re-cert     Problem List There are no active problems to display for this patient.   Yuriel Lopezmartinez PT, DPT 12/14/2015, 3:19 PM  [place] MAIN [facility] SERVICES [address] [place], [place], [address] Phone: [phone]   Fax:  [phone]  Name:  Holly Conner MRN: [id number] Date of Birth: [birth year]

## 2015-12-16 ENCOUNTER — Encounter: Payer: Self-pay | Admitting: Physical Therapy

## 2015-12-16 ENCOUNTER — Ambulatory Visit: Payer: Medicare Other | Admitting: Physical Therapy

## 2015-12-16 DIAGNOSIS — M6281 Muscle weakness (generalized): Secondary | ICD-10-CM

## 2015-12-16 DIAGNOSIS — R2681 Unsteadiness on feet: Secondary | ICD-10-CM

## 2015-12-16 NOTE — Therapy (Signed)
[place] MAIN [facility] SERVICES [address] [place], [place], [address] Phone: [phone]   Fax:  [phone]  Physical Therapy Treatment  Patient Details  Name: Holly Conner MRN: [id number] Date of Birth: April 12, 1941 Referring Provider: Gurney Maxin MD  Encounter Date: 12/16/2015      PT End of Session - 12/16/15 1439    Visit Number 6   Number of Visits 17   Date for PT Re-Evaluation 01/11/16   Authorization Type gcode 6   Authorization Time Period 10   PT Start Time 1430   PT Stop Time 1500   PT Time Calculation (min) 30 min   Equipment Utilized During Treatment --   Activity Tolerance Treatment limited secondary to medical complications (Comment)   Behavior During Therapy Anxious      Past Medical History  Diagnosis Date  . Depression     controlled  . Hypertension     somewhat controlled  . 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 No pertinent past surgical history.  There were no vitals filed for this visit.      Subjective Assessment - 12/16/15 1436    Subjective Patient reports having a hard time today; She reports having low blood sugar this morning with increased shaking; Patient reports having to use a BP cuff monitor and reports having increased pain in arm with the squeezing having increased difficulty disconnecting the machine;    Pertinent History personal factors affecting rehab: back pain chronic, kidney failure (stage IV);    Limitations Standing;Walking   How long can you sit comfortably? 2+ hours   How long can you stand comfortably? 5 min due to back pain    How long can you walk comfortably? unsure, not long;    Patient Stated Goals was walking in the neighborhood and is wanting to get back to that; Be able to do self care ADLs without falling;    Currently in Pain? No/denies        TREATMENT: Warm up on Nustep BUE/BLE level 2 x4 min (unbilled);  Attempted cross over step with resisted weighted gait; Patient unable to cross foot in front unsupported; Standing behind counter, holding on with both hands, cross over step x10 feet with cross in front and behind, close supervision;  Patient became increasingly shaky; Patient reports fearing that her blood sugar was low; PT assessed patient's blood sugar which was 336. Due to patient being symptomatic and feeling tired/weak and high blood sugar, PT recommended that we stop PT today. Will continue to monitor blood sugar during subsequent sessions;                           PT Education - 12/16/15 1438    Education provided Yes   Education Details balance exercise; sugar levels   Person(s) Educated Patient   Methods Explanation;Verbal cues   Comprehension Verbalized understanding;Returned demonstration;Verbal cues required             PT Long Term Goals - 12/14/15 1516    PT LONG TERM GOAL #1   Title Patient will be independent in home exercise program to improve strength/mobility for better functional independence with ADLs by 12/30/15   Time 8   Period Weeks   Status On-going   PT LONG TERM GOAL #2  Title Patient (> 55 years old) will complete five times sit to stand test in < 15 seconds indicating an increased LE strength and improved balance. by 12/30/15   Time 8   Period Weeks   Status On-going   PT LONG TERM GOAL #3   Title Patient will increase Berg Balance score by > 6 points to demonstrate decreased fall risk during functional activities. by 12/30/15   Time 8   Period Weeks   Status On-going   PT LONG TERM GOAL #4   Title Patient will increase 10 meter walk test to >1.37m/s as to improve gait speed for better community ambulation and to reduce fall risk. by 12/30/15   Time 8   Period Weeks   Status On-going   PT LONG TERM GOAL #5   Title Patient will increase BLE gross strength to 4+/5  as to improve functional strength for independent gait, increased standing tolerance and increased ADL ability. by 12/30/15   Time 8   Period Weeks   Status On-going               Plan - 12/16/15 1439    Clinical Impression Statement Patient instructed in balance exercise. While performing advanced task of cross over step, patient became shaky. She expressed concern over blood sugar. PT assessed blood sugar which was high at 336. Patient was feeling weak/tired and due to being symptomatic PT recommended that we stop session; Patient agreeable. Will continue to monitor blood sugar at subsequent sessions;    Rehab Potential Fair   Clinical Impairments Affecting Rehab Potential positive: Motivated; negative: chronic pain, co-morbidities etc; Patient's current presentation is evolving as she has episodes of shaking with multiple falls in last few months. She also has multiple co-morbidities which affect rehab tolerance including stage IV kidney failure, chronic back pain and uncontrolled diabetes;    PT Frequency 2x / week   PT Duration 4 weeks   PT Treatment/Interventions ADLs/Self Care Home Management;Cryotherapy;Electrical Stimulation;Moist Heat;Balance training;Therapeutic exercise;Therapeutic activities;Functional mobility training;Stair training;Gait training;DME Instruction;Neuromuscular re-education;Patient/family education;Manual techniques;Taping;Energy conservation   PT Next Visit Plan initiate balance HEP   PT Home Exercise Plan continue as previously given;    Consulted and Agree with Plan of Care Patient      Patient will benefit from skilled therapeutic intervention in order to improve the following deficits and impairments:  Abnormal gait, Decreased endurance, Obesity, Cardiopulmonary status limiting activity, Decreased activity tolerance, Decreased strength, Decreased knowledge of use of DME, Pain, Difficulty walking, Decreased mobility, Decreased balance, Decreased range of  motion, Improper body mechanics, Postural dysfunction, Impaired flexibility, Decreased safety awareness  Visit Diagnosis: Unsteadiness on feet  Muscle weakness (generalized)     Problem List There are no active problems to display for this patient.   Edgar Corrigan PT, DPT 12/16/2015, 3:10 PM  [place] MAIN [facility] SERVICES [address] [place], [place], [address] Phone: [phone]   Fax:  [phone]  Name: Holly Conner MRN: [id number] Date of Birth: 1941/03/29

## 2015-12-19 ENCOUNTER — Inpatient Hospital Stay
Admission: EM | Admit: 2015-12-19 | Discharge: 2015-12-20 | DRG: 092 | Disposition: A | Discharge status: Home / Self Care | Payer: Medicare Other | Attending: Specialist | Admitting: Specialist

## 2015-12-19 ENCOUNTER — Other Ambulatory Visit: Payer: Self-pay

## 2015-12-19 ENCOUNTER — Emergency Department: Payer: Medicare Other

## 2015-12-19 DIAGNOSIS — E669 Obesity, unspecified: Secondary | ICD-10-CM | POA: Diagnosis present

## 2015-12-19 DIAGNOSIS — Z96651 Presence of right artificial knee joint: Secondary | ICD-10-CM | POA: Diagnosis present

## 2015-12-19 DIAGNOSIS — E1122 Type 2 diabetes mellitus with diabetic chronic kidney disease: Secondary | ICD-10-CM | POA: Diagnosis present

## 2015-12-19 DIAGNOSIS — Z9889 Other specified postprocedural states: Secondary | ICD-10-CM

## 2015-12-19 DIAGNOSIS — Z794 Long term (current) use of insulin: Secondary | ICD-10-CM | POA: Diagnosis not present

## 2015-12-19 DIAGNOSIS — R4701 Aphasia: Secondary | ICD-10-CM | POA: Diagnosis not present

## 2015-12-19 DIAGNOSIS — Z90710 Acquired absence of both cervix and uterus: Secondary | ICD-10-CM

## 2015-12-19 DIAGNOSIS — F329 Major depressive disorder, single episode, unspecified: Secondary | ICD-10-CM | POA: Diagnosis present

## 2015-12-19 DIAGNOSIS — G459 Transient cerebral ischemic attack, unspecified: Secondary | ICD-10-CM | POA: Diagnosis not present

## 2015-12-19 DIAGNOSIS — I639 Cerebral infarction, unspecified: Secondary | ICD-10-CM | POA: Diagnosis present

## 2015-12-19 DIAGNOSIS — Z8249 Family history of ischemic heart disease and other diseases of the circulatory system: Secondary | ICD-10-CM

## 2015-12-19 DIAGNOSIS — Z90722 Acquired absence of ovaries, bilateral: Secondary | ICD-10-CM | POA: Diagnosis not present

## 2015-12-19 DIAGNOSIS — N184 Chronic kidney disease, stage 4 (severe): Secondary | ICD-10-CM | POA: Diagnosis present

## 2015-12-19 DIAGNOSIS — K219 Gastro-esophageal reflux disease without esophagitis: Secondary | ICD-10-CM | POA: Diagnosis present

## 2015-12-19 DIAGNOSIS — R739 Hyperglycemia, unspecified: Secondary | ICD-10-CM

## 2015-12-19 DIAGNOSIS — E1140 Type 2 diabetes mellitus with diabetic neuropathy, unspecified: Secondary | ICD-10-CM | POA: Diagnosis present

## 2015-12-19 DIAGNOSIS — Z8051 Family history of malignant neoplasm of kidney: Secondary | ICD-10-CM

## 2015-12-19 DIAGNOSIS — I129 Hypertensive chronic kidney disease with stage 1 through stage 4 chronic kidney disease, or unspecified chronic kidney disease: Secondary | ICD-10-CM | POA: Diagnosis present

## 2015-12-19 DIAGNOSIS — E785 Hyperlipidemia, unspecified: Secondary | ICD-10-CM | POA: Diagnosis present

## 2015-12-19 DIAGNOSIS — Z7982 Long term (current) use of aspirin: Secondary | ICD-10-CM

## 2015-12-19 DIAGNOSIS — Z79899 Other long term (current) drug therapy: Secondary | ICD-10-CM

## 2015-12-19 DIAGNOSIS — I10 Essential (primary) hypertension: Secondary | ICD-10-CM | POA: Diagnosis present

## 2015-12-19 DIAGNOSIS — E119 Type 2 diabetes mellitus without complications: Secondary | ICD-10-CM

## 2015-12-19 DIAGNOSIS — M353 Polymyalgia rheumatica: Secondary | ICD-10-CM | POA: Diagnosis present

## 2015-12-19 HISTORY — DX: Polymyalgia rheumatica: M35.3

## 2015-12-19 HISTORY — DX: Gastro-esophageal reflux disease without esophagitis: K21.9

## 2015-12-19 HISTORY — DX: Hyperlipidemia, unspecified: E78.5

## 2015-12-19 LAB — URINALYSIS COMPLETE WITH MICROSCOPIC (ARMC ONLY)
BILIRUBIN URINE: NEGATIVE
Bacteria, UA: NONE SEEN
Glucose, UA: >500 mg/dL — AB
HGB URINE DIPSTICK: NEGATIVE
KETONES UR: NEGATIVE mg/dL
Leukocytes, UA: NEGATIVE
Nitrite: NEGATIVE
PH: 6 (ref 5.0–8.0)
Protein, ur: >500 mg/dL — AB
SPECIFIC GRAVITY, URINE: 1.02 (ref 1.005–1.030)

## 2015-12-19 LAB — CBC
HCT: 42.7 % (ref 35.0–47.0)
Hemoglobin: 14.5 g/dL (ref 12.0–16.0)
MCH: 30.6 pg (ref 26.0–34.0)
MCHC: 34 g/dL (ref 32.0–36.0)
MCV: 90.1 fL (ref 80.0–100.0)
PLATELETS: 248 10*3/uL (ref 150–440)
RBC: 4.74 MIL/uL (ref 3.80–5.20)
RDW: 13.3 % (ref 11.5–14.5)
WBC: 8 10*3/uL (ref 3.6–11.0)

## 2015-12-19 LAB — BASIC METABOLIC PANEL
Anion gap: 10 (ref 5–15)
BUN: 24 mg/dL — ABNORMAL HIGH (ref 6–20)
CO2: 25 mmol/L (ref 22–32)
Calcium: 8.7 mg/dL — ABNORMAL LOW (ref 8.9–10.3)
Chloride: 99 mmol/L — ABNORMAL LOW (ref 101–111)
Creatinine, Ser: 1.7 mg/dL — ABNORMAL HIGH (ref 0.44–1.00)
GFR calc Af Amer: 33 mL/min — ABNORMAL LOW (ref >60)
GFR calc non Af Amer: 28 mL/min — ABNORMAL LOW (ref >60)
Glucose, Bld: 383 mg/dL — ABNORMAL HIGH (ref 65–99)
Potassium: 4.2 mmol/L (ref 3.5–5.1)
Sodium: 134 mmol/L — ABNORMAL LOW (ref 135–145)

## 2015-12-19 LAB — DIFFERENTIAL
Basophils Absolute: 0.1 10*3/uL (ref 0–0.1)
Basophils Relative: 1 %
Eosinophils Absolute: 0.1 10*3/uL (ref 0–0.7)
Eosinophils Relative: 2 %
Lymphocytes Relative: 31 %
Lymphs Abs: 2.5 10*3/uL (ref 1.0–3.6)
Monocytes Absolute: 0.6 10*3/uL (ref 0.2–0.9)
Monocytes Relative: 8 %
Neutro Abs: 4.7 10*3/uL (ref 1.4–6.5)
Neutrophils Relative %: 58 %

## 2015-12-19 LAB — URINE DRUG SCREEN, QUALITATIVE (ARMC ONLY)
Amphetamines, Ur Screen: NOT DETECTED
Barbiturates, Ur Screen: NOT DETECTED
Benzodiazepine, Ur Scrn: NOT DETECTED
Cannabinoid 50 Ng, Ur ~~LOC~~: NOT DETECTED
Cocaine Metabolite,Ur ~~LOC~~: NOT DETECTED
MDMA (Ecstasy)Ur Screen: NOT DETECTED
Methadone Scn, Ur: NOT DETECTED
Opiate, Ur Screen: NOT DETECTED
Phencyclidine (PCP) Ur S: NOT DETECTED
Tricyclic, Ur Screen: NOT DETECTED

## 2015-12-19 LAB — HEPATIC FUNCTION PANEL
ALT: 13 U/L — ABNORMAL LOW (ref 14–54)
AST: 20 U/L (ref 15–41)
Albumin: 3.4 g/dL — ABNORMAL LOW (ref 3.5–5.0)
Alkaline Phosphatase: 74 U/L (ref 38–126)
Bilirubin, Direct: <0.1 mg/dL — ABNORMAL LOW (ref 0.1–0.5)
Total Bilirubin: 0.5 mg/dL (ref 0.3–1.2)
Total Protein: 6.6 g/dL (ref 6.5–8.1)

## 2015-12-19 LAB — TROPONIN I: Troponin I: 0.03 ng/mL (ref <0.031)

## 2015-12-19 LAB — PROTIME-INR
INR: 0.99
Prothrombin Time: 13.3 seconds (ref 11.4–15.0)

## 2015-12-19 LAB — APTT: aPTT: 29 seconds (ref 24–36)

## 2015-12-19 LAB — ETHANOL: Alcohol, Ethyl (B): <5 mg/dL (ref <5)

## 2015-12-19 LAB — GLUCOSE, CAPILLARY: Glucose-Capillary: 394 mg/dL — ABNORMAL HIGH (ref 65–99)

## 2015-12-19 MED ORDER — ASPIRIN 81 MG PO CHEW
324.0000 mg | CHEWABLE_TABLET | Freq: Once | ORAL | Status: AC
  Administered 2015-12-19: 324 mg via ORAL
  Filled 2015-12-19: qty 4

## 2015-12-19 MED ORDER — HEPARIN SODIUM (PORCINE) 5000 UNIT/ML IJ SOLN
5000.0000 [IU] | Freq: Three times a day (TID) | INTRAMUSCULAR | Status: DC
  Administered 2015-12-20 (×2): 5000 [IU] via SUBCUTANEOUS
  Filled 2015-12-19 (×2): qty 1

## 2015-12-19 MED ORDER — INSULIN ASPART 100 UNIT/ML ~~LOC~~ SOLN
0.0000 [IU] | Freq: Three times a day (TID) | SUBCUTANEOUS | Status: DC
  Administered 2015-12-20: 10:00:00 5 [IU] via SUBCUTANEOUS
  Administered 2015-12-20: 3 [IU] via SUBCUTANEOUS
  Filled 2015-12-19: qty 5
  Filled 2015-12-19: qty 3

## 2015-12-19 MED ORDER — STROKE: EARLY STAGES OF RECOVERY BOOK
Freq: Once | Status: AC
  Administered 2015-12-20: 01:00:00

## 2015-12-19 MED ORDER — ATORVASTATIN CALCIUM 20 MG PO TABS: 40.0000 mg | ORAL_TABLET | Freq: Every day | ORAL | Status: DC

## 2015-12-19 MED ORDER — ASPIRIN 81 MG PO CHEW
81.0000 mg | CHEWABLE_TABLET | Freq: Every day | ORAL | Status: DC
  Administered 2015-12-20: 81 mg via ORAL
  Filled 2015-12-19: qty 1

## 2015-12-19 MED ORDER — CITALOPRAM HYDROBROMIDE 20 MG PO TABS
20.0000 mg | ORAL_TABLET | Freq: Every day | ORAL | Status: DC
  Administered 2015-12-20: 20 mg via ORAL
  Filled 2015-12-19: qty 1

## 2015-12-19 NOTE — ED Notes (Signed)
Pt bib niece from home w/ c/o weakness and difficulty speaking.  Pt has exaggerated mouth twitch, weakness w/ walking and expressive dysphagia.  Pts niece sts that she was sent by pts sister to check at approx 6 pm.  Pt sts that her s/s began this AM, unable to be more specific. Pt sts she is unable to articulate thoughts.

## 2015-12-19 NOTE — ED Provider Notes (Addendum)
[facility] Emergency Department Provider Note  Time seen: 8:48 PM  I have reviewed the triage vital signs and the nursing notes.   HISTORY  Chief Complaint Aphasia    HPI Holly Conner is a 75 y.o. female with a past medical history of depression, hypertension, stage IV kidney disease, diabetes, who presents the emergency department difficulty talking. According to the patient and family, patient has been experiencing difficulty talking since this morning but cannot give a clear onset of symptoms. Patient states she knows where she is trying to save a has difficulty expressing it. Denies any weakness or numbness of any arm or leg. Patient does have left cheek biting throughout her exam which they state is normal for her due to one of her medications.Patient denies headache.      Past Medical History  Diagnosis Date  . Depression     controlled  . Hypertension     somewhat controlled  . 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   History reviewed. No pertinent past surgical history.  Current Outpatient Rx  Name  Route  Sig  Dispense  Refill  . aspirin 81 MG tablet   Oral   Take 81 mg by mouth daily.         . benazepril (LOTENSIN) 40 MG tablet   Oral   Take 40 mg by mouth daily.         . citalopram (CELEXA) 20 MG tablet   Oral   Take 20 mg by mouth daily.         . clindamycin (CLEOCIN T) 1 % lotion   Topical   Apply topically 2 (two) times daily.         Marland Kitchen econazole nitrate 1 % cream   Topical   Apply topically daily.         . furosemide (LASIX) 20 MG tablet   Oral   Take 20 mg by mouth.         . gabapentin (NEURONTIN) 300 MG capsule   Oral   Take 300 mg by mouth 3 (three) times daily.         . insulin aspart (NOVOLOG) 100 UNIT/ML injection   Subcutaneous   Inject  into the skin 3 (three) times daily before meals.         . INSULIN DEGLUDEC [place]   Subcutaneous   Inject into the skin.         . Liraglutide (VICTOZA [place])   Subcutaneous   Inject into the skin.         Marland Kitchen lovastatin (MEVACOR) 40 MG tablet   Oral   Take 40 mg by mouth at bedtime.         Marland Kitchen omega-3 acid ethyl esters (LOVAZA) 1 g capsule   Oral   Take by mouth 2 (two) times daily.         . propranolol (INDERAL) 20 MG tablet   Oral   Take 20 mg by mouth 3 (three) times daily.         . traZODone (DESYREL) 100 MG tablet   Oral   Take 100 mg by mouth at bedtime.         . urea (CARMOL) 20 % cream   Topical   Apply topically as needed.  Allergies Review of patient's allergies indicates no known allergies.  No family history on file.  Social History Social History  Substance Use Topics  . Smoking status: Never Smoker   . Smokeless tobacco: None  . Alcohol Use: None    Review of Systems Constitutional: Negative for fever. Cardiovascular: Negative for chest pain. Respiratory: Negative for shortness of breath. Gastrointestinal: Negative for abdominal pain Neurological: Negative for headache. Positive for difficulty speaking. Negative for focal weakness or numbness. 10-point ROS otherwise negative.  ____________________________________________   PHYSICAL EXAM:  VITAL SIGNS: ED Triage Vitals  Enc Vitals Group     BP 12/19/15 2045 178/76 mmHg     Pulse Rate 12/19/15 2045 83     Resp 12/19/15 2045 16     Temp --      Temp src --      SpO2 12/19/15 2045 95 %     Weight 12/19/15 2045 260 lb (117.935 kg)     Height --      Head Cir --      Peak Flow --      Pain Score 12/19/15 2045 0     Pain Loc --      Pain Edu? --      Excl. in [place]? --    Constitutional: Alert and oriented. Well appearing and in no distress. Eyes: Normal exam ENT   Head: Normocephalic and atraumatic   Mouth/Throat: Mucous membranes are  moist. Cardiovascular: Normal rate, regular rhythm. No murmur Respiratory: Normal respiratory effort without tachypnea nor retractions. Breath sounds are clear Gastrointestinal: Soft and nontender. No distention.   Musculoskeletal: Nontender with normal range of motion in all extremities Neurologic:  Normal speech and language. No gross focal neurologic deficits Skin:  Skin is warm, dry and intact.  Psychiatric: Mood and affect are normal. Speech and behavior are normal.  ____________________________________________    EKG  EKG reviewed and interpreted by myself shows normal sinus rhythm at 82 bpm, widened QRS, left axis deviation, nonspecific ST changes.  ____________________________________________    RADIOLOGY  CT shows no acute abnormality  ____________________________________________    INITIAL IMPRESSION / ASSESSMENT AND PLAN / ED COURSE  Pertinent labs & imaging results that were available during my care of the patient were reviewed by me and considered in my medical decision making (see chart for details).  Patient presents the emergency department with difficulty speaking beginning this morning. Patient cannot give a clear onset of deficit. Ring exam the patient does have difficulty speaking. When asked what I was holding my hand, (my pen), the patient states "tape." Patient told her family to go home as they are going to "teach her" to the hospital. Family states they have never had issues like this in the past. Otherwise the patient's neurologic exam is intact. Throughout the Exam patient bites inside of her left cheek which the family states is normal due to her medications. Exam is currently very concerning for CVA.  Specialist on call has seen the patient, they agree patient is likely suffering from a CVA. NIH stroke scale of 4. Patient will be admitted to the hospital. I dosed aspirin. Patient is hyperglycemic, we will dose IV fluids in the emergency  department.   NIH Stroke Scale   Time: 9:53 PM Person Administering Scale: Neftali Thurow  Administer stroke scale items in the order listed. Record performance in each category after each subscale exam. Do not go back and change scores. Follow directions provided for each exam technique. Scores should  reflect what the patient does, not what the clinician thinks the patient can do. The clinician should record answers while administering the exam and work quickly. Except where indicated, the patient should not be coached (i.e., repeated requests to patient to make a special effort).   1a  Level of consciousness: 0=alert; keenly responsive  1b. LOC questions:  1=Performs one task correctly  1c. LOC commands: 0=Performs both tasks correctly  2.  Best Gaze: 0=normal  3.  Visual: 0=No visual loss  4. Facial Palsy: 0=Normal symmetric movement  5a.  Motor left arm: 0=No drift, limb holds 90 (or 45) degrees for full 10 seconds  5b.  Motor right arm: 0=No drift, limb holds 90 (or 45) degrees for full 10 seconds  6a. motor left leg: 0=No drift, limb holds 90 (or 45) degrees for full 10 seconds  6b  Motor right leg:  0=No drift, limb holds 90 (or 45) degrees for full 10 seconds  7. Limb Ataxia: 0=Absent  8.  Sensory: 0=Normal; no sensory loss  9. Best Language:  1=Mild to moderate aphasia; some obvious loss of fluency or facility of comprehension without significant limitation on ideas expressed or form of expression.  10. Dysarthria: 1=Mild to moderate, patient slurs at least some words and at worst, can be understood with some difficulty  11. Extinction and Inattention: 1=Visual, tactile, auditory, spatial or personal inattention or extinction to bilateral simultaneous stimulation in one of the sensory modalities  12. Distal motor function: 0=Normal   Total:   4   ____________________________________________   FINAL CLINICAL IMPRESSION(S) / ED DIAGNOSES  CVA    Harvest Dark,  MD 12/19/15 [id number]  Harvest Dark, MD 12/19/15 2154

## 2015-12-19 NOTE — H&P (Signed)
[place] [facility] [place] NAME: Holly Conner    MR#:  [id number]  DATE OF BIRTH:  [birth year]  DATE OF ADMISSION:  12/19/2015  PRIMARY CARE PHYSICIAN: Dion Body, MD   REQUESTING/REFERRING PHYSICIAN: Kerman Passey, MD  CHIEF COMPLAINT:   Chief Complaint  Patient presents with  . Aphasia    HISTORY OF PRESENT ILLNESS:  Holly Conner  is a 75 y.o. female who presents with Acute onset of left-sided weakness and difficulty speaking. Patient states that her speech difficulty started around 7:00 this evening, early in the afternoon she had onset of left-sided weakness. Initial workup in the ED is within normal limits, though patient has persistent word salad and expressive aphasia as well as left-sided weakness. Hospitals were called for admission  PAST MEDICAL HISTORY:   Past Medical History  Diagnosis Date  . Depression     controlled  . Hypertension     somewhat controlled  . 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 HLD (hyperlipidemia)   . GERD (gastroesophageal reflux disease)   . Polymyalgia rheumatica ([place])     PAST SURGICAL HISTORY:   Past Surgical History  Procedure Laterality Date  . Replacement total knee Right   . Tonsillectomy and adenoidectomy    . Abdominal hysterectomy    . Bilateral salpingoophorectomy    . Esophageal dilation      SOCIAL HISTORY:   Social History  Substance Use Topics  . Smoking status: Never Smoker   . Smokeless tobacco: Not on file  . Alcohol Use: No    FAMILY HISTORY:   Family History  Problem Relation Age of Onset  . CAD Brother   . Kidney cancer Mother     DRUG ALLERGIES:  No Known Allergies  MEDICATIONS AT HOME:   Prior to Admission medications   Medication Sig Start Date End Date Taking? Authorizing Provider  aspirin 81 MG tablet Take 81 mg by mouth daily.     Historical Provider, MD  benazepril (LOTENSIN) 40 MG tablet Take 40 mg by mouth daily.    Historical Provider, MD  citalopram (CELEXA) 20 MG tablet Take 20 mg by mouth daily.    Historical Provider, MD  clindamycin (CLEOCIN T) 1 % lotion Apply topically 2 (two) times daily.    Historical Provider, MD  econazole nitrate 1 % cream Apply topically daily.    Historical Provider, MD  furosemide (LASIX) 20 MG tablet Take 20 mg by mouth.    Historical Provider, MD  gabapentin (NEURONTIN) 300 MG capsule Take 300 mg by mouth 3 (three) times daily.    Historical Provider, MD  insulin aspart (NOVOLOG) 100 UNIT/ML injection Inject into the skin 3 (three) times daily before meals.    Historical Provider, MD  INSULIN DEGLUDEC [place] Inject into the skin.    Historical Provider, MD  Liraglutide (VICTOZA ) Inject into the skin.    Historical Provider, MD  lovastatin (MEVACOR) 40 MG tablet Take 40 mg by mouth at bedtime.    Historical Provider, MD  omega-3 acid ethyl esters (LOVAZA) 1 g capsule Take by mouth 2 (two) times daily.    Historical Provider, MD  propranolol (INDERAL) 20 MG tablet Take 20 mg by mouth 3 (three) times daily.    Historical Provider, MD  traZODone (DESYREL) 100 MG tablet Take 100 mg by mouth at bedtime.  Historical Provider, MD  urea (CARMOL) 20 % cream Apply topically as needed.    Historical Provider, MD    REVIEW OF SYSTEMS:  Review of Systems  Constitutional: Negative for fever, chills, weight loss and malaise/fatigue.  HENT: Negative for ear pain, hearing loss and tinnitus.   Eyes: Negative for blurred vision, double vision, pain and redness.  Respiratory: Negative for cough, hemoptysis and shortness of breath.   Cardiovascular: Negative for chest pain, palpitations, orthopnea and leg swelling.  Gastrointestinal: Negative for nausea, vomiting, abdominal pain, diarrhea and constipation.  Genitourinary: Negative for dysuria, frequency and hematuria.  Musculoskeletal: Negative for  back pain, joint pain and neck pain.  Skin:       No acne, rash, or lesions  Neurological: Positive for speech change and focal weakness. Negative for dizziness, tremors and weakness.  Endo/Heme/Allergies: Negative for polydipsia. Does not bruise/bleed easily.  Psychiatric/Behavioral: Negative for depression. The patient is not nervous/anxious and does not have insomnia.      VITAL SIGNS:   Filed Vitals:   12/19/15 2045  BP: 178/76  Pulse: 83  Resp: 16  Weight: 117.935 kg (260 lb)  SpO2: 95%   Wt Readings from Last 3 Encounters:  12/19/15 117.935 kg (260 lb)    PHYSICAL EXAMINATION:  Physical Exam  Vitals reviewed. Constitutional: She is oriented to person, place, and time. She appears well-developed and well-nourished. No distress.  HENT:  Head: Normocephalic and atraumatic.  Mouth/Throat: Oropharynx is clear and moist.  Eyes: Conjunctivae and EOM are normal. Pupils are equal, round, and reactive to light. No scleral icterus.  Neck: Normal range of motion. Neck supple. No JVD present. No thyromegaly present.  Cardiovascular: Normal rate, regular rhythm and intact distal pulses.  Exam reveals no gallop and no friction rub.   No murmur heard. Respiratory: Effort normal and breath sounds normal. No respiratory distress. She has no wheezes. She has no rales.  GI: Soft. Bowel sounds are normal. She exhibits no distension. There is no tenderness.  Musculoskeletal: Normal range of motion. She exhibits no edema.  No arthritis, no gout  Lymphadenopathy:    She has no cervical adenopathy.  Neurological: She is alert and oriented to person, place, and time. No cranial nerve deficit.  Neurologic: Cranial nerves II-XII intact, Sensation intact to light touch/pinprick, 5/5 strength in right extremities with 3/5 strength in left extremities, no dysarthria, expressive aphasia with word salad, no dysphagia, memory intact, mild pronator drift   Skin: Skin is warm and dry. No rash noted. No  erythema.  Psychiatric: She has a normal mood and affect. Her behavior is normal. Judgment and thought content normal.    LABORATORY PANEL:   CBC  Recent Labs Lab 12/19/15 2026  WBC 8.0  HGB 14.5  HCT 42.7  PLT 248   ------------------------------------------------------------------------------------------------------------------  Chemistries   Recent Labs Lab 12/19/15 2026  NA 134*  K 4.2  CL 99*  CO2 25  GLUCOSE 383*  BUN 24*  CREATININE 1.70*  CALCIUM 8.7*  AST 20  ALT 13*  ALKPHOS 74  BILITOT 0.5   ------------------------------------------------------------------------------------------------------------------  Cardiac Enzymes  Recent Labs Lab 12/19/15 2026  TROPONINI 0.03   ------------------------------------------------------------------------------------------------------------------  RADIOLOGY:  Ct Head Wo Contrast  12/19/2015  CLINICAL DATA:  Code stroke. Difficulty speaking and ambulating with confusion today. EXAM: CT HEAD WITHOUT CONTRAST TECHNIQUE: Contiguous axial images were obtained from the base of the skull through the vertex without intravenous contrast. COMPARISON:  None. FINDINGS: There is no evidence of acute cortical  infarct, intracranial hemorrhage, mass, midline shift, or extra-axial fluid collection. There is mild generalized cerebral atrophy. Periventricular white-matter hypodensities are nonspecific but compatible with mild chronic small vessel ischemic disease. Orbits are unremarkable. Paranasal sinuses and mastoid air cells are clear. No acute osseous abnormality is identified. IMPRESSION: 1. No evidence of acute intracranial abnormality. 2. Mild chronic small vessel ischemic disease and cerebral atrophy. These results were called by telephone at the time of interpretation on 12/19/2015 at 8:23 pm to Dr. Harvest Dark , who verbally acknowledged these results. Electronically Signed   By: Logan Bores M.D.   On: 12/19/2015 20:24     EKG:   Orders placed or performed during the hospital encounter of 12/19/15  . ED EKG  . ED EKG  . ED EKG  . ED EKG    IMPRESSION AND PLAN:  Principal Problem:   Stroke [facility]) - admission per TIA/stroke order set. Notably including MRI/MRA brain, carotid Dopplers, appropriate lab work, neurology consult. Permissive hypertension for the first 24 hours, see below. Active Problems:   Type 2 diabetes mellitus (HCC) - sliding scale insulin with corresponding glucose checks before meals and at bedtime and carb modified diet when she passes her swallow eval   HTN (hypertension) - hold home antihypertensives for now, permissive hypertension less than 220/120 for the first 24 hours, and then control more tightly   HLD (hyperlipidemia) - increase statin to Lipitor   CKD (chronic kidney disease), stage IV (HCC) - at baseline, and avoid nephrotoxins and monitor closely  All the records are reviewed and case discussed with ED provider. Management plans discussed with the patient and/or family.  DVT PROPHYLAXIS: SubQ heparin  GI PROPHYLAXIS: None  ADMISSION STATUS: Inpatient  CODE STATUS: Full Code Status History    This patient does not have a recorded code status. Please follow your organizational policy for patients in this situation.      TOTAL TIME TAKING CARE OF THIS PATIENT: 45 minutes.    Schylar Allard FIELDING 12/19/2015, 9:36 PM  [facility]  Office  [phone]  CC: Primary care physician; Dion Body, MD

## 2015-12-19 NOTE — ED Notes (Signed)
MD at bedside. 

## 2015-12-19 NOTE — ED Notes (Signed)
Called code stroke 2011

## 2015-12-19 NOTE — Consult Note (Signed)
Patient brought to ED by niece Apolonio Schneiders, stroke symptoms.  Patient very confused, being admitted for symptoms/determination of POC. Niece frustrated with her family, as they didn't call 911, delayed care for patient.  Family dynamics at play in this situation.  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

## 2015-12-20 ENCOUNTER — Inpatient Hospital Stay
Admit: 2015-12-20 | Discharge: 2015-12-20 | Disposition: A | Discharge status: Home / Self Care | Payer: Medicare Other | Attending: Internal Medicine | Admitting: Internal Medicine

## 2015-12-20 ENCOUNTER — Inpatient Hospital Stay: Payer: Medicare Other

## 2015-12-20 DIAGNOSIS — G459 Transient cerebral ischemic attack, unspecified: Secondary | ICD-10-CM

## 2015-12-20 LAB — LIPID PANEL
Cholesterol: 189 mg/dL (ref 0–200)
HDL: 41 mg/dL (ref >40)
LDL CALC: 110 mg/dL — AB (ref 0–99)
TRIGLYCERIDES: 191 mg/dL — AB (ref <150)
Total CHOL/HDL Ratio: 4.6 RATIO
VLDL: 38 mg/dL (ref 0–40)

## 2015-12-20 LAB — GLUCOSE, CAPILLARY
GLUCOSE-CAPILLARY: 272 mg/dL — AB (ref 65–99)
Glucose-Capillary: 240 mg/dL — ABNORMAL HIGH (ref 65–99)

## 2015-12-20 LAB — ECHOCARDIOGRAM COMPLETE
HEIGHTINCHES: 66 in
WEIGHTICAEL: 3444.8 [oz_av]

## 2015-12-20 MED ORDER — CLOPIDOGREL BISULFATE 75 MG PO TABS: 75.0000 mg | ORAL_TABLET | Freq: Every day | ORAL | Status: DC

## 2015-12-20 MED ORDER — ATORVASTATIN CALCIUM 40 MG PO TABS: 40.0000 mg | ORAL_TABLET | Freq: Every day | ORAL | Status: DC

## 2015-12-20 NOTE — Care Management (Signed)
Presented to [facility] with the diagnosis of stroke. Lives alone. Relative is Rachael [phone]). [facility] in 2013. [facility] in [place] in the past. Takes care of all basic and instrumental activities of daily living herself, drives. Fell x 3 in the past. Has a cane that she will learn how to use. Life Alert in the home, but needs to be updated with new telephone number.  Relative will transport. Physical therapy evaluation completed. Recommends home with home health/physical therapy. Would like to go with [facility]. Will fax referral to [facility]. Discharge to home today per Dr. Heron Sabins. Shelbie Ammons RN MSN CCM Care Management [phone]

## 2015-12-20 NOTE — Evaluation (Signed)
Physical Therapy Evaluation Patient Details Name: Holly Conner MRN: [id number] DOB: 1940-12-30 Today's Date: 12/20/2015   History of Present Illness  Pt is a 75 y.o. F admitted to hospital for weakness and expressive dysphagia. Pt has received CT and MRI/MRA, all of which were negative. Pt has hx of HTN, depression, stage IV kidney disease, and DM. Pt stated she was scheduled to receive OP PT prior to admission for gait training with cane. Pt has recent hx of falls.   Clinical Impression  Pt is a pleasant 75 y.o. F admitted to hospital for weakness and expressive dysphagia. Prior to admission, pt lived at home alone and performed all ADLs independently. Pt was to receive outpatient PT prior to admission for gait training w/cane d/t weakness and recent falls. Pt demonstrates poor UE and L LE strength, and good R LE strength. Upon start of evaluation, pt in bathroom and was able to perform toileting independently. Pt able to perform bed mobility with modified independence. Pt able to transfer from EOB independently. Pt able to ambulate approx 50 ft with CGA and use of railings in hallway. Pt performed seated there-ex requiring min to no assist. Pt demonstrates deficits in mobility and strength. Pt would benefit from further skilled PT to address deficits and reduce risk of falls; recommend pt receive home health PT after discharge from acute hospitalization.     Follow Up Recommendations Home health PT    Equipment Recommendations       Recommendations for Other Services       Precautions / Restrictions Precautions Precautions: Fall Restrictions Weight Bearing Restrictions: No      Mobility  Bed Mobility Overal bed mobility: Modified Independent             General bed mobility comments: Pt required use of bed railings and increased time to perform bed mobility  Transfers Overall transfer level: Independent Equipment used: None             General transfer comment: Pt  able to transfer from EOB and recliner independently.   Ambulation/Gait Ambulation/Gait assistance: Min guard Ambulation Distance (Feet): 50 Feet Assistive device: None Gait Pattern/deviations: Step-through pattern Gait velocity: slow   General Gait Details: Pt able to ambulate with no AD and CGA approx 50 ft. Pt demonstrates step through gait pattern and slow gait speed. Pt preferred to not use RW when ambulating, however did hold onto hand rails in hallway w/R hand during ambulation. Pt demonstrated some unsteadiness t/o gait. Ambulation limited by pts fatigue.   Stairs            Wheelchair Mobility    Modified Rankin (Stroke Patients Only)       Balance Overall balance assessment: Independent (able to stand while changing gown )                                           Pertinent Vitals/Pain Pain Assessment: No/denies pain    Home Living Family/patient expects to be discharged to:: Private residence Living Arrangements: Alone Available Help at Discharge: Friend(s) Type of Home: House Home Access: Stairs to enter Entrance Stairs-Rails: None (bar on R side she stated she uses) Technical brewer of Steps: 2 Home Layout: One level Home Equipment: [place] - single point Additional Comments: Pt has neighbors/friends in the area she said are available to help her.     Prior Function Level  of Independence: Independent         Comments: Pt performed all ADLs independently including driving. Pt stated she was not using cane before coming in, however was scheduled for PT to help gait train w/cane.     Hand Dominance        Extremity/Trunk Assessment   Upper Extremity Assessment: RUE deficits/detail;LUE deficits/detail RUE Deficits / Details: R UE grossly 4-/5, pt demonstrates tremors w/ strength assessment     LUE Deficits / Details: L UE grossly 4-/5, demostrates tremors w/strength assessment    Lower Extremity Assessment: RLE  deficits/detail;LLE deficits/detail RLE Deficits / Details: R LE grossly 4/5 strength LLE Deficits / Details: L LE grossly 4-/5 strength     Communication   Communication: No difficulties  Cognition Arousal/Alertness: Awake/alert Behavior During Therapy: WFL for tasks assessed/performed Overall Cognitive Status: Within Functional Limits for tasks assessed                      General Comments      Exercises Other Exercises Other Exercises: Pt performed seated ther-ex on B LE including LAQ, ankle pumps, marching and SLR. Pt required no assist w/exercises except SLR on L side required mod assist. Ther-ex performed x10 reps, marching performed x15.  Other Exercises: Pt went into bathroom at start of evaluation. Pt able to perform all toileting by herself w/o assist.       Assessment/Plan    PT Assessment Patient needs continued PT services  PT Diagnosis Difficulty walking;Abnormality of gait;Generalized weakness   PT Problem List Decreased strength;Decreased mobility;Decreased knowledge of use of DME  PT Treatment Interventions DME instruction;Gait training;Stair training;Therapeutic exercise   PT Goals (Current goals can be found in the Care Plan section) Acute Rehab PT Goals Patient Stated Goal: to be able to perform ADLs independently, reduce falls  PT Goal Formulation: With patient Time For Goal Achievement: 01/03/16 Potential to Achieve Goals: Good    Frequency Min 2X/week   Barriers to discharge        Co-evaluation               End of Session Equipment Utilized During Treatment: Gait belt Activity Tolerance: Patient tolerated treatment well Patient left: in bed;with call bell/phone within reach;with bed alarm set           Time: 0950-1016 PT Time Calculation (min) (ACUTE ONLY): 26 min   Charges:         PT G Codes:        Sherral Hammers 2015-12-25, 11:19 AM M. Barnett Abu, SPT

## 2015-12-20 NOTE — Progress Notes (Signed)
*  PRELIMINARY RESULTS* Echocardiogram 2D Echocardiogram has been performed.  Laqueta Jean Hege 12/20/2015, 8:00 AM

## 2015-12-20 NOTE — Progress Notes (Signed)
Speech Therapy Note: received order, reviewed chart notes. Noted pt's MRI was negative and her speech has returned to her normal baseline w/ no Aphasia observed and noted per Neurology consult note. No swallowing deficits have been reported by NSG. ST services will sign off at this time. NSG to reconsult if any change in status. NSG agreed.

## 2015-12-20 NOTE — Progress Notes (Addendum)
Inpatient Diabetes Program Recommendations  AACE/ADA: New Consensus Statement on Inpatient Glycemic Control (2015)  Target Ranges:  Prepandial:   less than 140 mg/dL      Peak postprandial:   less than 180 mg/dL (1-2 hours)      Critically ill patients:  140 - 180 mg/dL   Results for Holly Conner, Holly Conner (MRN [id number]) as of 12/20/2015 07:12  Ref. Range 12/19/2015 20:44  Glucose-Capillary Latest Ref Range: 65-99 mg/dL 394 (H)    Admit with: CVA  History: DM, CKD, HTN  Home DM Meds: Tyler Aas insulin (insulin degludec) 50-60 units QHS       Novolog 10 units with breakfast/ 15 units with lunch/ 15 units with dinner       Victoza 1.2 mg daily  Current Insulin Orders: Novolog Sensitive Correction Scale/ SSI (0-9 units) TID AC/HS       MD- Note patient saw her Endocrinologist (Dr. Lucilla Lame with [facility]) on 12/14/15.  At that visit, patient was instructed to take her Tyler Aas insulin (basal insulin) at 60 units QHS consistently.  Patient was also instructed to reduce her breakfast dose of Novolog to 5 units.  Note current A1c pending.  MD- Please consider starting at least 50% of patient's home dose of basal insulin as Levemir insulin- Levemir 30 units daily       --Will follow patient during hospitalization--  Wyn Quaker RN, MSN, CDE Diabetes Coordinator Inpatient Glycemic Control Team Team Pager: [phone] (8a-5p)

## 2015-12-20 NOTE — Consult Note (Signed)
Referring Physician: Verdell Carmine    Chief Complaint: Aphasia   HPI: Holly Conner is an 75 y.o. female who reports that she was talking to a friend on the phone and acutely became unable to get her words out.  The friend called a family member who came to check on her and patient was noted to have left sided weakness as well.  She was still unable to express herself clearly at that time and the patient was brought in for evaluation.  Initial NIHSS of 3.    Date last known well: 12/19/2015 Time last known well: Time: 15:00 tPA Given: No: Outside time window  Past Medical History  Diagnosis Date  . Depression     controlled  . Hypertension     somewhat controlled  . Kidney failure     4th stage; but bloodwork is stable;   Marland Kitchen Diabetes mellitus without complication ([place])     managed well;   . HLD (hyperlipidemia)   . GERD (gastroesophageal reflux disease)   . Polymyalgia rheumatica (HCC)     Past Surgical History  Procedure Laterality Date  . Replacement total knee Right   . Tonsillectomy and adenoidectomy    . Abdominal hysterectomy    . Bilateral salpingoophorectomy    . Esophageal dilation      Family History  Problem Relation Age of Onset  . CAD Brother   . Kidney cancer Mother    Social History:  reports that she has never smoked. She does not have any smokeless tobacco history on file. She reports that she does not drink alcohol or use illicit drugs.  Allergies: No Known Allergies  Medications:  I have reviewed the patient's current medications. Prior to Admission:  Prescriptions prior to admission  Medication Sig Dispense Refill Last Dose  . aspirin 81 MG tablet Take 81 mg by mouth daily.   unknown at unknown  . benazepril (LOTENSIN) 40 MG tablet Take 40 mg by mouth 2 (two) times daily.    unknown at unknown  . citalopram (CELEXA) 20 MG tablet Take 40 mg by mouth daily.    unknown at unknown  . furosemide (LASIX) 20 MG tablet Take 20 mg by mouth 2 (two) times  daily.    unknown at unknown  . gabapentin (NEURONTIN) 300 MG capsule Take 600 mg by mouth 2 (two) times daily.    unknown at unknown  . lovastatin (MEVACOR) 40 MG tablet Take 40 mg by mouth at bedtime.   unknown at unknown  . propranolol (INDERAL) 20 MG tablet Take 20 mg by mouth 2 (two) times daily.    unknown at unknown  . solifenacin (VESICARE) 10 MG tablet Take 10 mg by mouth daily.   unknown at unknown  . traZODone (DESYREL) 100 MG tablet Take 100 mg by mouth at bedtime.   unknown at unknown  . clindamycin (CLEOCIN T) 1 % lotion Apply topically 2 (two) times daily.     Marland Kitchen econazole nitrate 1 % cream Apply topically daily.     . insulin aspart (NOVOLOG) 100 UNIT/ML injection Inject into the skin 3 (three) times daily before meals.     . INSULIN DEGLUDEC [place] Inject into the skin.     . Liraglutide (VICTOZA [place]) Inject into the skin.     Marland Kitchen omega-3 acid ethyl esters (LOVAZA) 1 g capsule Take by mouth 2 (two) times daily.     . urea (CARMOL) 20 % cream Apply topically as needed.      Scheduled: .  aspirin  81 mg Oral Daily  . atorvastatin  40 mg Oral q1800  . citalopram  20 mg Oral Daily  . heparin  5,000 Units Subcutaneous Q8H  . insulin aspart  0-9 Units Subcutaneous TID AC & HS    ROS: History obtained from the patient  General ROS: negative for - chills, fatigue, fever, night sweats, weight gain or weight loss Psychological ROS: negative for - behavioral disorder, hallucinations, memory difficulties, mood swings or suicidal ideation Ophthalmic ROS: negative for - blurry vision, double vision, eye pain or loss of vision ENT ROS: negative for - epistaxis, nasal discharge, oral lesions, sore throat, tinnitus or vertigo Allergy and Immunology ROS: negative for - hives or itchy/watery eyes Hematological and Lymphatic ROS: negative for - bleeding problems, bruising or swollen lymph nodes Endocrine ROS: negative for - galactorrhea, hair pattern changes, polydipsia/polyuria or temperature  intolerance Respiratory ROS: negative for - cough, hemoptysis, shortness of breath or wheezing Cardiovascular ROS: negative for - chest pain, dyspnea on exertion, edema or irregular heartbeat Gastrointestinal ROS: negative for - abdominal pain, diarrhea, hematemesis, nausea/vomiting or stool incontinence Genito-Urinary ROS: negative for - dysuria, hematuria, incontinence or urinary frequency/urgency Musculoskeletal ROS: negative for - joint swelling or muscular weakness Neurological ROS: as noted in HPI Dermatological ROS: negative for rash and skin lesion changes  Physical Examination: Blood pressure 199/69, pulse 61, temperature 97.6 F (36.4 C), temperature source Oral, resp. rate 18, height 5\' 6"  (1.676 m), weight 97.659 kg (215 lb 4.8 oz), SpO2 95 %.  HEENT-  Normocephalic, no lesions, without obvious abnormality.  Normal external eye and conjunctiva.  Normal TM's bilaterally.  Normal auditory canals and external ears. Normal external nose, mucus membranes and septum.  Normal pharynx. Cardiovascular- S1, S2 normal, pulses palpable throughout   Lungs- chest clear, no wheezing, rales, normal symmetric air entry Abdomen- soft, non-tender; bowel sounds normal; no masses,  no organomegaly Extremities- no edema Lymph-no adenopathy palpable Musculoskeletal-no joint tenderness, deformity or swelling Skin-warm and dry, no hyperpigmentation, vitiligo, or suspicious lesions  Neurological Examination Mental Status: Alert, oriented, thought content appropriate.  Speech fluent without evidence of aphasia.  Able to follow 3 step commands without difficulty. Cranial Nerves: II: Discs flat bilaterally; Visual fields grossly normal, pupils equal, round, reactive to light and accommodation III,IV, VI: ptosis not present, extra-ocular motions intact bilaterally V,VII: smile symmetric, facial light touch sensation normal bilaterally VIII: hearing normal bilaterally IX,X: gag reflex present XI:  bilateral shoulder shrug XII: midline tongue extension Motor: Right : Upper extremity   5/5    Left:     Upper extremity   5/5  Lower extremity   5/5     Lower extremity   5/5 Tone and bulk:normal tone throughout; no atrophy noted Sensory: Pinprick and light touch intact throughout, bilaterally Deep Tendon Reflexes: 2+ in the upper extremities and 1+ in the lower extremities Plantars: Right: mute   Left: mute Cerebellar: Normal finger-to-nose and normal heel-to-shin testing bilaterally Gait: not tested due to safety concerns   Laboratory Studies:  Basic Metabolic Panel:  Recent Labs Lab 12/19/15 2026  NA 134*  K 4.2  CL 99*  CO2 25  GLUCOSE 383*  BUN 24*  CREATININE 1.70*  CALCIUM 8.7*    Liver Function Tests:  Recent Labs Lab 12/19/15 2026  AST 20  ALT 13*  ALKPHOS 74  BILITOT 0.5  PROT 6.6  ALBUMIN 3.4*   No results for input(s): LIPASE, AMYLASE in the last 168 hours. No results for input(s): AMMONIA  in the last 168 hours.  CBC:  Recent Labs Lab 12/19/15 2026  WBC 8.0  NEUTROABS 4.7  HGB 14.5  HCT 42.7  MCV 90.1  PLT 248    Cardiac Enzymes:  Recent Labs Lab 12/19/15 2026  TROPONINI 0.03    BNP: Invalid input(s): POCBNP  CBG:  Recent Labs Lab 12/19/15 2044 12/20/15 1001  GLUCAP 394* 272*    Microbiology: No results found for this or any previous visit.  Coagulation Studies:  Recent Labs  12/19/15 2026  LABPROT 13.3  INR 0.99    Urinalysis:  Recent Labs Lab 12/19/15 2026  COLORURINE YELLOW*  LABSPEC 1.020  PHURINE 6.0  GLUCOSEU >500*  HGBUR NEGATIVE  BILIRUBINUR NEGATIVE  KETONESUR NEGATIVE  PROTEINUR >500*  NITRITE NEGATIVE  LEUKOCYTESUR NEGATIVE    Lipid Panel:    Component Value Date/Time   CHOL 189 12/20/2015 0513   TRIG 191* 12/20/2015 0513   HDL 41 12/20/2015 0513   CHOLHDL 4.6 12/20/2015 0513   VLDL 38 12/20/2015 0513   LDLCALC 110* 12/20/2015 0513    HgbA1C: No results found for:  HGBA1C  Urine Drug Screen:     Component Value Date/Time   LABOPIA NONE DETECTED 12/19/2015 2026   COCAINSCRNUR NONE DETECTED 12/19/2015 2026   LABBENZ NONE DETECTED 12/19/2015 2026   AMPHETMU NONE DETECTED 12/19/2015 2026   THCU NONE DETECTED 12/19/2015 2026   LABBARB NONE DETECTED 12/19/2015 2026    Alcohol Level:  Recent Labs Lab 12/19/15 2026  ETH <5    Other results: EKG: sinus rhythm at 82 bpm  Imaging: Dg Chest 2 View  12/20/2015  CLINICAL DATA:  Acute onset of CVA.  Initial encounter. EXAM: CHEST  2 VIEW COMPARISON:  CT of the chest performed 10/23/2012 FINDINGS: The lungs are well-aerated. Mild peribronchial thickening is noted. Mild left basilar atelectasis is seen. There is no evidence of pleural effusion or pneumothorax. The heart is borderline normal in size. No acute osseous abnormalities are seen. IMPRESSION: Mild peribronchial thickening noted. Mild left basilar atelectasis seen. Electronically Signed   By: Garald Balding M.D.   On: 12/20/2015 01:23   Ct Head Wo Contrast  12/19/2015  CLINICAL DATA:  Code stroke. Difficulty speaking and ambulating with confusion today. EXAM: CT HEAD WITHOUT CONTRAST TECHNIQUE: Contiguous axial images were obtained from the base of the skull through the vertex without intravenous contrast. COMPARISON:  None. FINDINGS: There is no evidence of acute cortical infarct, intracranial hemorrhage, mass, midline shift, or extra-axial fluid collection. There is mild generalized cerebral atrophy. Periventricular white-matter hypodensities are nonspecific but compatible with mild chronic small vessel ischemic disease. Orbits are unremarkable. Paranasal sinuses and mastoid air cells are clear. No acute osseous abnormality is identified. IMPRESSION: 1. No evidence of acute intracranial abnormality. 2. Mild chronic small vessel ischemic disease and cerebral atrophy. These results were called by telephone at the time of interpretation on 12/19/2015 at 8:23 pm  to Dr. Harvest Dark , who verbally acknowledged these results. Electronically Signed   By: Logan Bores M.D.   On: 12/19/2015 20:24   Mr Brain Wo Contrast  12/20/2015  CLINICAL DATA:  Stroke.  Left-sided weakness and speech difficulty EXAM: MRI HEAD WITHOUT CONTRAST MRA HEAD WITHOUT CONTRAST TECHNIQUE: Multiplanar, multiecho pulse sequences of the brain and surrounding structures were obtained without intravenous contrast. Angiographic images of the head were obtained using MRA technique without contrast. COMPARISON:  CT head 12/19/2015 FINDINGS: MRI HEAD FINDINGS Negative for acute infarct. Mild chronic changes in the white matter  bilaterally. Mild chronic changes in the pons. Mild atrophy.  Negative for hydrocephalus Negative for intracranial hemorrhage.  No fluid collection. Negative for mass or edema.  No shift of the midline structures Normal pituitary. Mild mucosal edema in the ethmoid sinuses. Normal orbit. Normal skullbase. MRA HEAD FINDINGS Both vertebral arteries patent to the basilar. PICA patent bilaterally. Basilar patent. Posterior cerebral arteries patent bilaterally. Left superior cerebellar artery patent. Small right superior cerebellar artery. Cavernous carotid widely patent bilaterally. Hypoplastic right A1 segment. Both anterior cerebral cerebral arteries supplied primarily from the left. Anterior and middle cerebral arteries are patent bilaterally without significant stenosis. Negative for cerebral aneurysm. IMPRESSION: No acute infarct. Mild chronic microvascular ischemic change in the white matter and pons Negative MRA head. Electronically Signed   By: Franchot Gallo M.D.   On: 12/20/2015 09:22   US Carotid Bilateral  12/20/2015  CLINICAL DATA:  75 year old female with stroke-like symptoms EXAM: BILATERAL CAROTID DUPLEX ULTRASOUND TECHNIQUE: Pearline Cables scale imaging, color Doppler and duplex ultrasound were performed of bilateral carotid and vertebral arteries in the neck. COMPARISON:   Prior carotid duplex ultrasound 10/06/2010 FINDINGS: Criteria: Quantification of carotid stenosis is based on velocity parameters that correlate the residual internal carotid diameter with NASCET-based stenosis levels, using the diameter of the distal internal carotid lumen as the denominator for stenosis measurement. The following velocity measurements were obtained: RIGHT ICA:  78/17 cm/sec CCA:  [id number] cm/sec SYSTOLIC ICA/CCA RATIO:  1.2 DIASTOLIC ICA/CCA RATIO:  2.1 ECA:  101 cm/sec LEFT ICA:  93/24 cm/sec CCA:  [id number] cm/sec SYSTOLIC ICA/CCA RATIO:  1.6 DIASTOLIC ICA/CCA RATIO:  2.1 ECA:  72 cm/sec RIGHT CAROTID ARTERY: Mild heterogeneous atherosclerotic plaque in the proximal internal carotid artery. By peak systolic velocity criteria the estimated stenosis is less than 50%. RIGHT VERTEBRAL ARTERY:  Patent with normal antegrade flow. LEFT CAROTID ARTERY: Trace heterogeneous atherosclerotic plaque in the proximal internal carotid artery. By peak systolic velocity criteria the estimated stenosis remains less than 50%. LEFT VERTEBRAL ARTERY:  Patent with normal antegrade flow. IMPRESSION: 1. Mild (1-49%) stenosis proximal right internal carotid artery secondary to heterogenous atherosclerotic plaque. 2. Mild (1-49%) stenosis proximal left internal carotid artery secondary to heterogenous atherosclerotic plaque. 3. Vertebral arteries are patent with normal antegrade flow. Signed, Criselda Peaches, MD [facility] Radiology Electronically Signed   By: Jacqulynn Cadet M.D.   On: 12/20/2015 08:35   Mr Jodene Nam Head/brain Wo Cm  12/20/2015  CLINICAL DATA:  Stroke.  Left-sided weakness and speech difficulty EXAM: MRI HEAD WITHOUT CONTRAST MRA HEAD WITHOUT CONTRAST TECHNIQUE: Multiplanar, multiecho pulse sequences of the brain and surrounding structures were obtained without intravenous contrast. Angiographic images of the head were obtained using MRA technique without  contrast. COMPARISON:  CT head 12/19/2015 FINDINGS: MRI HEAD FINDINGS Negative for acute infarct. Mild chronic changes in the white matter bilaterally. Mild chronic changes in the pons. Mild atrophy.  Negative for hydrocephalus Negative for intracranial hemorrhage.  No fluid collection. Negative for mass or edema.  No shift of the midline structures Normal pituitary. Mild mucosal edema in the ethmoid sinuses. Normal orbit. Normal skullbase. MRA HEAD FINDINGS Both vertebral arteries patent to the basilar. PICA patent bilaterally. Basilar patent. Posterior cerebral arteries patent bilaterally. Left superior cerebellar artery patent. Small right superior cerebellar artery. Cavernous carotid widely patent bilaterally. Hypoplastic right A1 segment. Both anterior cerebral cerebral arteries supplied primarily from the left. Anterior and middle cerebral arteries are patent bilaterally without significant stenosis. Negative for cerebral aneurysm.  IMPRESSION: No acute infarct. Mild chronic microvascular ischemic change in the white matter and pons Negative MRA head. Electronically Signed   By: Franchot Gallo M.D.   On: 12/20/2015 09:22    Assessment: 75 y.o. female presenting with left sided weakness and aphasia.  Symptoms have resolved.  MRI of the brain personally reviewed and shows no acute changes.  MRA of the brain was negative.  Carotid doppler shows no hemodynamically significant stenosis.  LDL is 110.  Echocardiogram and A1c are pending.  Patient on ASA at home.    Stroke Risk Factors - diabetes mellitus, hyperlipidemia and hypertension  Plan: 1. PT consult, OT consult, Speech consult 2. Echocardiogram, A1c pending 3. Prophylactic therapy-Antiplatelet med: Plavix - dose 75mg  daily 4. Telemetry monitoring 5. Frequent neuro checks 6. Agree with aggressive management of lipids with target LDL less than 70.      Alexis Goodell, MD Neurology [phone] 12/20/2015, 11:24 AM

## 2015-12-20 NOTE — Discharge Summary (Signed)
[facility] [place] NAME: Holly Conner    MR#:  [id number]  DATE OF BIRTH:  24-Feb-1941  DATE OF ADMISSION:  12/19/2015 ADMITTING PHYSICIAN: Lance Coon, MD  DATE OF DISCHARGE: 12/20/2015  2:36 PM  PRIMARY CARE PHYSICIAN: Dion Body, MD    ADMISSION DIAGNOSIS:  Hyperglycemia [R73.9] Cerebral infarction due to unspecified mechanism [I63.9]  DISCHARGE DIAGNOSIS:  Principal Problem:   Stroke [facility]) Active Problems:   Type 2 diabetes mellitus (HCC)   HTN (hypertension)   HLD (hyperlipidemia)   CKD (chronic kidney disease), stage IV ([place])   SECONDARY DIAGNOSIS:   Past Medical History  Diagnosis Date  . Depression     controlled  . Hypertension     somewhat controlled  . Kidney failure     4th stage; but bloodwork is stable;   Marland Kitchen Diabetes mellitus without complication ([place])     managed well;   . HLD (hyperlipidemia)   . GERD (gastroesophageal reflux disease)   . Polymyalgia rheumatica [facility])     HOSPITAL COURSE:   75 year old female with past history of hypertension, depression, chronic kidney disease, diabetes type 2 without, patient, hyperlipidemia, GERD, polymyalgia rheumatica, obesity who presented to the hospital due to aphasia.  1. TIA-this was the working diagnosis given the patient's transient neurologic symptoms of aphasia which had resolved upon admission. -Patient underwent an extensive neurologic evaluation including CT head, MRI of the brain, MRA of the brain, carotid duplex which were all essentially normal. Her echocardiogram also showed normal ejection fraction with no evidence of acute thrombus. She is clinically asymptomatic and back to baseline now. -She was seen by neurology who recommended switching her from aspirin to Plavix and also changing her to a high dose intensity statin which was done. Patient presently is being discharged on Plavix, atorvastatin. -Patient was seen by physical therapy who  recommended home health services which was arranged for her prior to discharge.  2. Essential hypertension-patient's blood pressures was a little bit labile but ultimately further followed as an outpatient. -She will continue her propranolol, benazepril.  3. Hyperlipidemia-patient's LDL was 110. Given her acute TIA she was switched to a high dose intensity statin. She was switched from lovastatin to atorvastatin.  4. Diabetes type 2 without complication-patient's blood sugars remained stable. She will continue her Victoza, NovoLog with meals.  5. Depression-she will continue her Celexa.  6. Diabetic neuropathy-she will continue her Neurontin.  DISCHARGE CONDITIONS:   Stable  CONSULTS OBTAINED:  Treatment Team:  Alexis Goodell, MD  DRUG ALLERGIES:  No Known Allergies  DISCHARGE MEDICATIONS:   Discharge Medication List as of 12/20/2015 12:53 PM    START taking these medications   Details  atorvastatin (LIPITOR) 40 MG tablet Take 1 tablet (40 mg total) by mouth daily at 6 PM., Starting 12/20/2015, Until Discontinued, Print    clopidogrel (PLAVIX) 75 MG tablet Take 1 tablet (75 mg total) by mouth daily., Starting 12/20/2015, Until Discontinued, Print      CONTINUE these medications which have NOT CHANGED   Details  benazepril (LOTENSIN) 40 MG tablet Take 40 mg by mouth 2 (two) times daily. , Until Discontinued, Historical Med    citalopram (CELEXA) 20 MG tablet Take 40 mg by mouth daily. , Until Discontinued, Historical Med    furosemide (LASIX) 20 MG tablet Take 20 mg by mouth 2 (two) times daily. , Until Discontinued, Historical Med    gabapentin (NEURONTIN) 300 MG capsule Take 600 mg by mouth 2 (  two) times daily. , Until Discontinued, Historical Med    propranolol (INDERAL) 20 MG tablet Take 20 mg by mouth 2 (two) times daily. , Until Discontinued, Historical Med    solifenacin (VESICARE) 10 MG tablet Take 10 mg by mouth daily., Until Discontinued, Historical Med     traZODone (DESYREL) 100 MG tablet Take 100 mg by mouth at bedtime., Until Discontinued, Historical Med    clindamycin (CLEOCIN T) 1 % lotion Apply topically 2 (two) times daily., Until Discontinued, Historical Med    econazole nitrate 1 % cream Apply topically daily., Until Discontinued, Historical Med    insulin aspart (NOVOLOG) 100 UNIT/ML injection Inject into the skin 3 (three) times daily before meals., Until Discontinued, Historical Med    INSULIN DEGLUDEC [place] Inject into the skin., Until Discontinued, Historical Med    Liraglutide (VICTOZA [place]) Inject into the skin., Until Discontinued, Historical Med    omega-3 acid ethyl esters (LOVAZA) 1 g capsule Take by mouth 2 (two) times daily., Until Discontinued, Historical Med    urea (CARMOL) 20 % cream Apply topically as needed., Until Discontinued, Historical Med      STOP taking these medications     aspirin 81 MG tablet      lovastatin (MEVACOR) 40 MG tablet          DISCHARGE INSTRUCTIONS:   DIET:  Cardiac diet and Diabetic diet  DISCHARGE CONDITION:  Stable  ACTIVITY:  Activity as tolerated  OXYGEN:  Home Oxygen: No.   Oxygen Delivery: room air  DISCHARGE LOCATION:  Home with home health nursing, physical therapy.   If you experience worsening of your admission symptoms, develop shortness of breath, life threatening emergency, suicidal or homicidal thoughts you must seek medical attention immediately by calling 911 or calling your MD immediately  if symptoms less severe.  You Must read complete instructions/literature along with all the possible adverse reactions/side effects for all the Medicines you take and that have been prescribed to you. Take any new Medicines after you have completely understood and accpet all the possible adverse reactions/side effects.   Please note  You were cared for by a hospitalist during your hospital stay. If you have any questions about your discharge medications or the care  you received while you were in the hospital after you are discharged, you can call the unit and asked to speak with the hospitalist on call if the hospitalist that took care of you is not available. Once you are discharged, your primary care physician will handle any further medical issues. Please note that NO REFILLS for any discharge medications will be authorized once you are discharged, as it is imperative that you return to your primary care physician (or establish a relationship with a primary care physician if you do not have one) for your aftercare needs so that they can reassess your need for medications and monitor your lab values.     Today   Aphasia resolved. No numbness, tingling, headache, nausea, vomiting.  VITAL SIGNS:  Blood pressure 129/98, pulse 64, temperature 97.6 F (36.4 C), temperature source Oral, resp. rate 18, height 5\' 6"  (1.676 m), weight 97.659 kg (215 lb 4.8 oz), SpO2 97 %.  I/O:   Intake/Output Summary (Last 24 hours) at 12/20/15 1510 Last data filed at 12/20/15 0953  Gross per 24 hour  Intake    120 ml  Output    660 ml  Net   -540 ml    PHYSICAL EXAMINATION:  GENERAL:  75 y.o.-year-old  obese patient lying in the bed with no acute distress.  EYES: Pupils equal, round, reactive to light and accommodation. No scleral icterus. Extraocular muscles intact.  HEENT: Head atraumatic, normocephalic. Oropharynx and nasopharynx clear.  NECK:  Supple, no jugular venous distention. No thyroid enlargement, no tenderness.  LUNGS: Normal breath sounds bilaterally, no wheezing, rales,rhonchi. No use of accessory muscles of respiration.  CARDIOVASCULAR: S1, S2 normal. No murmurs, rubs, or gallops.  ABDOMEN: Soft, non-tender, non-distended. Bowel sounds present. No organomegaly or mass.  EXTREMITIES: No pedal edema, cyanosis, or clubbing.  NEUROLOGIC: Cranial nerves II through XII are intact. No focal motor or sensory defecits b/l.  PSYCHIATRIC: The patient is alert and  oriented x 3. Good affect.  SKIN: No obvious rash, lesion, or ulcer.   DATA REVIEW:   CBC  Recent Labs Lab 12/19/15 2026  WBC 8.0  HGB 14.5  HCT 42.7  PLT 248    Chemistries   Recent Labs Lab 12/19/15 2026  NA 134*  K 4.2  CL 99*  CO2 25  GLUCOSE 383*  BUN 24*  CREATININE 1.70*  CALCIUM 8.7*  AST 20  ALT 13*  ALKPHOS 74  BILITOT 0.5    Cardiac Enzymes  Recent Labs Lab 12/19/15 2026  TROPONINI 0.03    Microbiology Results  No results found for this or any previous visit.  RADIOLOGY:  Dg Chest 2 View  12/20/2015  CLINICAL DATA:  Acute onset of CVA.  Initial encounter. EXAM: CHEST  2 VIEW COMPARISON:  CT of the chest performed 10/23/2012 FINDINGS: The lungs are well-aerated. Mild peribronchial thickening is noted. Mild left basilar atelectasis is seen. There is no evidence of pleural effusion or pneumothorax. The heart is borderline normal in size. No acute osseous abnormalities are seen. IMPRESSION: Mild peribronchial thickening noted. Mild left basilar atelectasis seen. Electronically Signed   By: Garald Balding M.D.   On: 12/20/2015 01:23   Ct Head Wo Contrast  12/19/2015  CLINICAL DATA:  Code stroke. Difficulty speaking and ambulating with confusion today. EXAM: CT HEAD WITHOUT CONTRAST TECHNIQUE: Contiguous axial images were obtained from the base of the skull through the vertex without intravenous contrast. COMPARISON:  None. FINDINGS: There is no evidence of acute cortical infarct, intracranial hemorrhage, mass, midline shift, or extra-axial fluid collection. There is mild generalized cerebral atrophy. Periventricular white-matter hypodensities are nonspecific but compatible with mild chronic small vessel ischemic disease. Orbits are unremarkable. Paranasal sinuses and mastoid air cells are clear. No acute osseous abnormality is identified. IMPRESSION: 1. No evidence of acute intracranial abnormality. 2. Mild chronic small vessel ischemic disease and cerebral  atrophy. These results were called by telephone at the time of interpretation on 12/19/2015 at 8:23 pm to Dr. Harvest Dark , who verbally acknowledged these results. Electronically Signed   By: Logan Bores M.D.   On: 12/19/2015 20:24   Mr Brain Wo Contrast  12/20/2015  CLINICAL DATA:  Stroke.  Left-sided weakness and speech difficulty EXAM: MRI HEAD WITHOUT CONTRAST MRA HEAD WITHOUT CONTRAST TECHNIQUE: Multiplanar, multiecho pulse sequences of the brain and surrounding structures were obtained without intravenous contrast. Angiographic images of the head were obtained using MRA technique without contrast. COMPARISON:  CT head 12/19/2015 FINDINGS: MRI HEAD FINDINGS Negative for acute infarct. Mild chronic changes in the white matter bilaterally. Mild chronic changes in the pons. Mild atrophy.  Negative for hydrocephalus Negative for intracranial hemorrhage.  No fluid collection. Negative for mass or edema.  No shift of the midline structures Normal pituitary. Mild  mucosal edema in the ethmoid sinuses. Normal orbit. Normal skullbase. MRA HEAD FINDINGS Both vertebral arteries patent to the basilar. PICA patent bilaterally. Basilar patent. Posterior cerebral arteries patent bilaterally. Left superior cerebellar artery patent. Small right superior cerebellar artery. Cavernous carotid widely patent bilaterally. Hypoplastic right A1 segment. Both anterior cerebral cerebral arteries supplied primarily from the left. Anterior and middle cerebral arteries are patent bilaterally without significant stenosis. Negative for cerebral aneurysm. IMPRESSION: No acute infarct. Mild chronic microvascular ischemic change in the white matter and pons Negative MRA head. Electronically Signed   By: Franchot Gallo M.D.   On: 12/20/2015 09:22   US Carotid Bilateral  12/20/2015  CLINICAL DATA:  75 year old female with stroke-like symptoms EXAM: BILATERAL CAROTID DUPLEX ULTRASOUND TECHNIQUE: Pearline Cables scale imaging, color Doppler and  duplex ultrasound were performed of bilateral carotid and vertebral arteries in the neck. COMPARISON:  Prior carotid duplex ultrasound 10/06/2010 FINDINGS: Criteria: Quantification of carotid stenosis is based on velocity parameters that correlate the residual internal carotid diameter with NASCET-based stenosis levels, using the diameter of the distal internal carotid lumen as the denominator for stenosis measurement. The following velocity measurements were obtained: RIGHT ICA:  78/17 cm/sec CCA:  [id number] cm/sec SYSTOLIC ICA/CCA RATIO:  1.2 DIASTOLIC ICA/CCA RATIO:  2.1 ECA:  101 cm/sec LEFT ICA:  93/24 cm/sec CCA:  [id number] cm/sec SYSTOLIC ICA/CCA RATIO:  1.6 DIASTOLIC ICA/CCA RATIO:  2.1 ECA:  72 cm/sec RIGHT CAROTID ARTERY: Mild heterogeneous atherosclerotic plaque in the proximal internal carotid artery. By peak systolic velocity criteria the estimated stenosis is less than 50%. RIGHT VERTEBRAL ARTERY:  Patent with normal antegrade flow. LEFT CAROTID ARTERY: Trace heterogeneous atherosclerotic plaque in the proximal internal carotid artery. By peak systolic velocity criteria the estimated stenosis remains less than 50%. LEFT VERTEBRAL ARTERY:  Patent with normal antegrade flow. IMPRESSION: 1. Mild (1-49%) stenosis proximal right internal carotid artery secondary to heterogenous atherosclerotic plaque. 2. Mild (1-49%) stenosis proximal left internal carotid artery secondary to heterogenous atherosclerotic plaque. 3. Vertebral arteries are patent with normal antegrade flow. Signed, Criselda Peaches, MD [facility] Electronically Signed   By: Jacqulynn Cadet M.D.   On: 12/20/2015 08:35   Mr Jodene Nam Head/brain Wo Cm  12/20/2015  CLINICAL DATA:  Stroke.  Left-sided weakness and speech difficulty EXAM: MRI HEAD WITHOUT CONTRAST MRA HEAD WITHOUT CONTRAST TECHNIQUE: Multiplanar, multiecho pulse sequences of the brain and surrounding structures were obtained  without intravenous contrast. Angiographic images of the head were obtained using MRA technique without contrast. COMPARISON:  CT head 12/19/2015 FINDINGS: MRI HEAD FINDINGS Negative for acute infarct. Mild chronic changes in the white matter bilaterally. Mild chronic changes in the pons. Mild atrophy.  Negative for hydrocephalus Negative for intracranial hemorrhage.  No fluid collection. Negative for mass or edema.  No shift of the midline structures Normal pituitary. Mild mucosal edema in the ethmoid sinuses. Normal orbit. Normal skullbase. MRA HEAD FINDINGS Both vertebral arteries patent to the basilar. PICA patent bilaterally. Basilar patent. Posterior cerebral arteries patent bilaterally. Left superior cerebellar artery patent. Small right superior cerebellar artery. Cavernous carotid widely patent bilaterally. Hypoplastic right A1 segment. Both anterior cerebral cerebral arteries supplied primarily from the left. Anterior and middle cerebral arteries are patent bilaterally without significant stenosis. Negative for cerebral aneurysm. IMPRESSION: No acute infarct. Mild chronic microvascular ischemic change in the white matter and pons Negative MRA head. Electronically Signed   By: Franchot Gallo M.D.   On: 12/20/2015 09:22  Management plans discussed with the patient, family and they are in agreement.  CODE STATUS:     Code Status Orders        Start     Ordered   12/19/15 2326  Full code   Continuous     12/19/15 2325    Code Status History    Date Active Date Inactive Code Status Order ID Comments User Context   This patient has a current code status but no historical code status.      TOTAL TIME TAKING CARE OF THIS PATIENT: 40 minutes.    Henreitta Leber M.D on 12/20/2015 at 3:10 PM  Between 7am to 6pm - Pager - [phone]  After 6pm go to [email or web address] - password [email or web address] [facility]  Office  [phone]  CC: Primary care physician; Dion Body, MD

## 2015-12-20 NOTE — Progress Notes (Signed)
PT Cancellation Note  Patient Details Name: Holly Conner MRN: [id number] DOB: 07-15-1941   Cancelled Treatment:    Reason Eval/Treat Not Completed: Other (comment) Attempted to see pt for PT, however pt out of the room at this time. Will re-attempt time permitting.    Sherral Hammers 12/20/2015, 8:36 AM M. Barnett Abu, SPT

## 2015-12-20 NOTE — Progress Notes (Signed)
OT Cancellation Note  Patient Details Name: Estrellita Levert MRN: [id number] DOB: 1940/11/20   Cancelled Treatment:    Reason Eval/Treat Not Completed: Medical issues which prohibited therapy (BP  elevated 199/69 this a.m., and Blood Gluclose elevated at 383. OT intervention is contraindicated at this time. Will continue to monitor, and intervene as appropriate.  Harrel Carina, MS, OTR/L    Harrel Carina 12/20/2015, 8:53 AM

## 2015-12-21 ENCOUNTER — Ambulatory Visit: Payer: Medicare Other | Admitting: Physical Therapy

## 2015-12-21 LAB — HEMOGLOBIN A1C: Hgb A1c MFr Bld: 6.9 % — ABNORMAL HIGH (ref 4.0–6.0)

## 2015-12-23 ENCOUNTER — Ambulatory Visit: Payer: Medicare Other | Admitting: Physical Therapy

## 2015-12-28 ENCOUNTER — Ambulatory Visit: Payer: Medicare Other | Admitting: Physical Therapy

## 2015-12-30 ENCOUNTER — Ambulatory Visit: Payer: Medicare Other | Admitting: Physical Therapy

## 2016-01-04 ENCOUNTER — Ambulatory Visit: Payer: Medicare Other | Admitting: Physical Therapy

## 2016-01-06 ENCOUNTER — Ambulatory Visit: Payer: Medicare Other | Admitting: Physical Therapy

## 2016-01-11 ENCOUNTER — Ambulatory Visit: Payer: Medicare Other | Admitting: Physical Therapy

## 2016-01-13 ENCOUNTER — Ambulatory Visit: Payer: Medicare Other | Admitting: Physical Therapy

## 2016-01-18 ENCOUNTER — Ambulatory Visit: Payer: Medicare Other | Admitting: Physical Therapy

## 2016-01-20 ENCOUNTER — Ambulatory Visit: Payer: Medicare Other | Admitting: Physical Therapy

## 2016-01-25 ENCOUNTER — Ambulatory Visit: Payer: Medicare Other | Admitting: Physical Therapy

## 2016-01-27 ENCOUNTER — Ambulatory Visit: Payer: Medicare Other | Admitting: Physical Therapy

## 2017-12-07 ENCOUNTER — Other Ambulatory Visit: Payer: Self-pay | Admitting: Family Medicine

## 2017-12-07 DIAGNOSIS — N644 Mastodynia: Secondary | ICD-10-CM

## 2017-12-18 ENCOUNTER — Ambulatory Visit
Admission: RE | Admit: 2017-12-18 | Discharge: 2017-12-18 | Disposition: A | Discharge status: Home / Self Care | Payer: Medicare Other | Source: Ambulatory Visit | Attending: Family Medicine | Admitting: Family Medicine

## 2017-12-18 DIAGNOSIS — N6489 Other specified disorders of breast: Secondary | ICD-10-CM | POA: Insufficient documentation

## 2017-12-18 DIAGNOSIS — N644 Mastodynia: Secondary | ICD-10-CM | POA: Diagnosis present

## 2018-07-16 ENCOUNTER — Ambulatory Visit (INDEPENDENT_AMBULATORY_CARE_PROVIDER_SITE_OTHER): Payer: Medicare Other | Admitting: Urology

## 2018-07-16 ENCOUNTER — Encounter: Payer: Self-pay | Admitting: Urology

## 2018-07-16 VITALS — BP 130/82 | Ht 61.0 in | Wt 206.3 lb

## 2018-07-16 DIAGNOSIS — N3946 Mixed incontinence: Secondary | ICD-10-CM

## 2018-07-16 DIAGNOSIS — N3020 Other chronic cystitis without hematuria: Secondary | ICD-10-CM

## 2018-07-16 LAB — URINALYSIS, COMPLETE
Bilirubin, UA: NEGATIVE
KETONES UA: NEGATIVE
LEUKOCYTES UA: NEGATIVE
Nitrite, UA: NEGATIVE
RBC, UA: NEGATIVE
SPEC GRAV UA: 1.025 (ref 1.005–1.030)
Urobilinogen, Ur: 0.2 mg/dL (ref 0.2–1.0)
pH, UA: 5.5 (ref 5.0–7.5)

## 2018-07-16 LAB — MICROSCOPIC EXAMINATION: RBC, UA: NONE SEEN /hpf (ref 0–2)

## 2018-07-16 NOTE — Progress Notes (Signed)
07/16/2018 6:41 PM   Holly Conner 1941-06-05 [id number]  Referring provider: Dion Body, MD [facility] [place], [place] [id number]  Chief Complaint  Patient presents with  . Establish Care    HPI: 77 year old female presents to ED establish local urologic care.  She has been followed by Dr. Jacqlyn Larsen for many years for a history of recurrent UTI, urinary frequency and urinary incontinence.  She last saw Dr. Jacqlyn Larsen at [facility] in May 2019.  She was going to follow-up in [place] in May 2020 however several weeks ago had increased urinary incontinence at night and during the day.  She subsequently realized she was not taking her Vesicare and the symptoms significantly improved when she restarted her present dose.  She does have baseline daytime frequency and nocturia x5-6 however rare episodes of incontinence.  She denies recent UTI.  Denies dysuria or gross hematuria.  She has no flank/abdominal/pelvic pain.   PMH: Past Medical History:  Diagnosis Date  . Arthritis   . Depression    controlled  . Diabetes mellitus without complication ([place])    managed well;   Marland Kitchen GERD (gastroesophageal reflux disease)   . Hepatitis   . HLD (hyperlipidemia)   . Hypertension    somewhat controlled  . 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 Polymyalgia rheumatica ([place])   . Sleep apnea   . Stroke [facility])     Surgical History: Past Surgical History:  Procedure Laterality Date  . ABDOMINAL HYSTERECTOMY    . BILATERAL SALPINGOOPHORECTOMY    . ESOPHAGEAL DILATION    . OOPHORECTOMY    . REDUCTION MAMMAPLASTY Bilateral 2003  . REPLACEMENT TOTAL KNEE Right   . TONSILLECTOMY AND ADENOIDECTOMY      Home Medications:  Allergies as of 07/16/2018      Reactions   Iodinated Diagnostic Agents Shortness Of Breath   Only with nuclear stress test per pt Only with nuclear stress test per pt   Penicillins Rash   Rash at injection site. Pt states she can  take Amoxicillin & Keflex   Escitalopram    Gluten Meal    Lithium    Wheat Bran    Erythromycin Nausea Only, Nausea And Vomiting   Other reaction(s): Other (See Comments) dehydration      Medication List        Accurate as of 07/16/18  6:41 PM. Always use your most recent med list.          aspirin EC 81 MG tablet Take by mouth.   benazepril 40 MG tablet Commonly known as:  LOTENSIN Take 40 mg by mouth 2 (two) times daily.   chlorthalidone 25 MG tablet Commonly known as:  HYGROTON chlorthalidone 25 mg tablet   citalopram 20 MG tablet Commonly known as:  CELEXA Take 40 mg by mouth daily.   clopidogrel 75 MG tablet Commonly known as:  PLAVIX Take 1 tablet (75 mg total) by mouth daily.   FIFTY50 GLUCOSE METER 2.0 w/Device Kit Use as instructed.   FIFTY50 PEN NEEDLES 31G X 8 MM Misc Generic drug:  Insulin Pen Needle Use 1 needle to skin four times daily as directed with insulin pens   FREESTYLE LIBRE 14 DAY SENSOR Misc USE AS DIRECTED EVERY 14 DAYS   NOVOLOG FLEXPEN 100 UNIT/ML FlexPen Generic drug:  insulin aspart Novolog Flexpen U-100 Insulin aspart 100 unit/mL (3 mL) subcutaneous   propranolol 20 MG tablet Commonly known as:  INDERAL  Take 20 mg by mouth 2 (two) times daily.   pyridOXINE 100 MG tablet Commonly known as:  VITAMIN B-6 Take 100 mg by mouth daily.   simvastatin 80 MG tablet Commonly known as:  ZOCOR simvastatin 80 mg tablet   solifenacin 10 MG tablet Commonly known as:  VESICARE Take 10 mg by mouth daily.   traZODone 100 MG tablet Commonly known as:  DESYREL Take 100 mg by mouth at bedtime.   TRESIBA FLEXTOUCH 200 UNIT/ML Sopn Generic drug:  Insulin Degludec Tresiba FlexTouch U-200 insulin 200 unit/mL (3 mL) subcutaneous pen   VICTOZA 18 MG/3ML Sopn Generic drug:  liraglutide Victoza 3-Pak 0.6 mg/0.1 mL (18 mg/3 mL) subcutaneous pen injector   VITAMIN B-12 PO Take by mouth.   vitamin C 1000 MG tablet Take 1,000 mg by mouth  daily.       Allergies:  Allergies  Allergen Reactions  . Iodinated Diagnostic Agents Shortness Of Breath    Only with nuclear stress test per pt Only with nuclear stress test per pt   . Penicillins Rash    Rash at injection site. Pt states she can take Amoxicillin & Keflex   . Escitalopram   . Gluten Meal   . Lithium   . Wheat Bran   . Erythromycin Nausea Only and Nausea And Vomiting    Other reaction(s): Other (See Comments) dehydration     Family History: Family History  Problem Relation Age of Onset  . CAD Brother   . Kidney cancer Mother   . Breast cancer Cousin     Social History:  reports that she has never smoked. She has never used smokeless tobacco. She reports that she does not drink alcohol or use drugs.  ROS: UROLOGY Frequent Urination?: Yes Hard to postpone urination?: Yes Burning/pain with urination?: No Get up at night to urinate?: Yes Leakage of urine?: Yes Urine stream starts and stops?: No Trouble starting stream?: No Do you have to strain to urinate?: No Blood in urine?: No Urinary tract infection?: No Sexually transmitted disease?: No Injury to kidneys or bladder?: No Painful intercourse?: No Weak stream?: No Currently pregnant?: No Vaginal bleeding?: No Last menstrual period?: Hysterectomy   Gastrointestinal Nausea?: No Vomiting?: No Indigestion/heartburn?: Yes Diarrhea?: Yes Constipation?: No  Constitutional Fever: No Night sweats?: No Weight loss?: No Fatigue?: Yes  Skin Skin rash/lesions?: Yes Itching?: Yes  Eyes Blurred vision?: No Double vision?: No  Ears/Nose/Throat Sore throat?: No Sinus problems?: No  Hematologic/Lymphatic Swollen glands?: No Easy bruising?: No  Cardiovascular Leg swelling?: Yes Chest pain?: No  Respiratory Cough?: No Shortness of breath?: Yes  Endocrine Excessive thirst?: No  Musculoskeletal Back pain?: Yes Joint pain?: Yes  Neurological Headaches?: No Dizziness?:  No  Psychologic Depression?: No Anxiety?: No  Physical Exam: BP 130/82 (BP Location: Left Arm, Patient Position: Sitting, Cuff Size: Normal)   Ht _0  (1.549 m)   Wt 206 lb 4.8 oz (93.6 kg)   BMI 38.98 kg/m   Constitutional:  Alert and oriented, No acute distress. HEENT: [place] AT, moist mucus membranes.  Trachea midline, no masses. Cardiovascular: No clubbing, cyanosis, or edema. Respiratory: Normal respiratory effort, no increased work of breathing. GI: Abdomen is soft, nontender, nondistended, no abdominal masses GU: No CVA tenderness Lymph: No cervical or inguinal lymphadenopathy. Skin: No rashes, bruises or suspicious lesions. Neurologic: Grossly intact, no focal deficits, moving all 4 extremities. Psychiatric: Normal mood and affect.   Assessment & Plan:   77 year old female with a long history of overactive  bladder symptoms doing well on Vesicare.  She does have daytime frequency and nocturia x5-6 however states her symptoms are currently not bothersome enough that she desires additional medication.  Should this change Myrbetriq could be added.  Urinalysis today was clear.  She did not need a solifenacin refill.  She will follow-up in 1 year and will call earlier for any significant change in her symptoms.  Abbie Sons, [place] [address] [place], [place] [address] [phone]

## 2018-10-11 ENCOUNTER — Other Ambulatory Visit: Payer: Self-pay | Admitting: Urology

## 2018-10-11 MED ORDER — SOLIFENACIN SUCCINATE 10 MG PO TABS: 10.0000 mg | ORAL_TABLET | Freq: Every day | ORAL | 3 refills | Status: AC

## 2018-10-11 NOTE — Telephone Encounter (Signed)
solifenacin cuccinate tab 10mg  1 daily - pt asks for refill to be sent to [facility] on [address]. Please advise. Thanks.

## 2018-10-11 NOTE — Telephone Encounter (Signed)
Vesicare refill sent to [facility]

## 2018-12-18 ENCOUNTER — Emergency Department: Payer: Medicare Other

## 2018-12-18 ENCOUNTER — Other Ambulatory Visit: Payer: Self-pay

## 2018-12-18 ENCOUNTER — Inpatient Hospital Stay
Admission: EM | Admit: 2018-12-18 | Discharge: 2018-12-20 | DRG: 699 | Disposition: A | Discharge status: Home / Self Care | Payer: Medicare Other | Attending: Internal Medicine | Admitting: Internal Medicine

## 2018-12-18 DIAGNOSIS — E11649 Type 2 diabetes mellitus with hypoglycemia without coma: Secondary | ICD-10-CM | POA: Diagnosis present

## 2018-12-18 DIAGNOSIS — R0602 Shortness of breath: Secondary | ICD-10-CM

## 2018-12-18 DIAGNOSIS — E1122 Type 2 diabetes mellitus with diabetic chronic kidney disease: Principal | ICD-10-CM | POA: Diagnosis present

## 2018-12-18 DIAGNOSIS — Z7982 Long term (current) use of aspirin: Secondary | ICD-10-CM

## 2018-12-18 DIAGNOSIS — I214 Non-ST elevation (NSTEMI) myocardial infarction: Secondary | ICD-10-CM

## 2018-12-18 DIAGNOSIS — M353 Polymyalgia rheumatica: Secondary | ICD-10-CM | POA: Diagnosis present

## 2018-12-18 DIAGNOSIS — I5032 Chronic diastolic (congestive) heart failure: Secondary | ICD-10-CM | POA: Diagnosis present

## 2018-12-18 DIAGNOSIS — Z794 Long term (current) use of insulin: Secondary | ICD-10-CM | POA: Diagnosis not present

## 2018-12-18 DIAGNOSIS — Z79899 Other long term (current) drug therapy: Secondary | ICD-10-CM | POA: Diagnosis not present

## 2018-12-18 DIAGNOSIS — D649 Anemia, unspecified: Secondary | ICD-10-CM | POA: Diagnosis present

## 2018-12-18 DIAGNOSIS — I130 Hypertensive heart and chronic kidney disease with heart failure and stage 1 through stage 4 chronic kidney disease, or unspecified chronic kidney disease: Secondary | ICD-10-CM | POA: Diagnosis present

## 2018-12-18 DIAGNOSIS — Z8673 Personal history of transient ischemic attack (TIA), and cerebral infarction without residual deficits: Secondary | ICD-10-CM | POA: Diagnosis not present

## 2018-12-18 DIAGNOSIS — F329 Major depressive disorder, single episode, unspecified: Secondary | ICD-10-CM | POA: Diagnosis present

## 2018-12-18 DIAGNOSIS — Z803 Family history of malignant neoplasm of breast: Secondary | ICD-10-CM

## 2018-12-18 DIAGNOSIS — G4733 Obstructive sleep apnea (adult) (pediatric): Secondary | ICD-10-CM | POA: Diagnosis present

## 2018-12-18 DIAGNOSIS — F419 Anxiety disorder, unspecified: Secondary | ICD-10-CM | POA: Diagnosis present

## 2018-12-18 DIAGNOSIS — Z7902 Long term (current) use of antithrombotics/antiplatelets: Secondary | ICD-10-CM | POA: Diagnosis not present

## 2018-12-18 DIAGNOSIS — N184 Chronic kidney disease, stage 4 (severe): Secondary | ICD-10-CM | POA: Diagnosis present

## 2018-12-18 DIAGNOSIS — I160 Hypertensive urgency: Secondary | ICD-10-CM | POA: Diagnosis present

## 2018-12-18 DIAGNOSIS — Z8051 Family history of malignant neoplasm of kidney: Secondary | ICD-10-CM

## 2018-12-18 DIAGNOSIS — Z96651 Presence of right artificial knee joint: Secondary | ICD-10-CM | POA: Diagnosis present

## 2018-12-18 DIAGNOSIS — E785 Hyperlipidemia, unspecified: Secondary | ICD-10-CM | POA: Diagnosis present

## 2018-12-18 DIAGNOSIS — Z90710 Acquired absence of both cervix and uterus: Secondary | ICD-10-CM | POA: Diagnosis not present

## 2018-12-18 DIAGNOSIS — R531 Weakness: Secondary | ICD-10-CM

## 2018-12-18 DIAGNOSIS — K219 Gastro-esophageal reflux disease without esophagitis: Secondary | ICD-10-CM | POA: Diagnosis present

## 2018-12-18 DIAGNOSIS — Z8249 Family history of ischemic heart disease and other diseases of the circulatory system: Secondary | ICD-10-CM

## 2018-12-18 LAB — CBC
HCT: 31.7 % — ABNORMAL LOW (ref 36.0–46.0)
Hemoglobin: 10.5 g/dL — ABNORMAL LOW (ref 12.0–15.0)
MCH: 29.8 pg (ref 26.0–34.0)
MCHC: 33.1 g/dL (ref 30.0–36.0)
MCV: 90.1 fL (ref 80.0–100.0)
Platelets: 323 10*3/uL (ref 150–400)
RBC: 3.52 MIL/uL — ABNORMAL LOW (ref 3.87–5.11)
RDW: 13.4 % (ref 11.5–15.5)
WBC: 9.8 10*3/uL (ref 4.0–10.5)
nRBC: 0 % (ref 0.0–0.2)

## 2018-12-18 LAB — BASIC METABOLIC PANEL
Anion gap: 7 (ref 5–15)
BUN: 27 mg/dL — ABNORMAL HIGH (ref 8–23)
CO2: 27 mmol/L (ref 22–32)
Calcium: 9.4 mg/dL (ref 8.9–10.3)
Chloride: 107 mmol/L (ref 98–111)
Creatinine, Ser: 2.13 mg/dL — ABNORMAL HIGH (ref 0.44–1.00)
GFR calc Af Amer: 25 mL/min — ABNORMAL LOW (ref >60)
GFR calc non Af Amer: 21 mL/min — ABNORMAL LOW (ref >60)
Glucose, Bld: 92 mg/dL (ref 70–99)
Potassium: 4.3 mmol/L (ref 3.5–5.1)
Sodium: 141 mmol/L (ref 135–145)

## 2018-12-18 LAB — APTT: aPTT: 34 seconds (ref 24–36)

## 2018-12-18 LAB — GLUCOSE, CAPILLARY
Glucose-Capillary: 96 mg/dL (ref 70–99)
Glucose-Capillary: 226 mg/dL — ABNORMAL HIGH (ref 70–99)

## 2018-12-18 LAB — PROTIME-INR
INR: 1 (ref 0.8–1.2)
Prothrombin Time: 13.1 seconds (ref 11.4–15.2)

## 2018-12-18 LAB — TROPONIN I
Troponin I: 0.15 ng/mL (ref <0.03)
Troponin I: 0.11 ng/mL (ref <0.03)

## 2018-12-18 MED ORDER — HYDRALAZINE HCL 20 MG/ML IJ SOLN
10.0000 mg | Freq: Once | INTRAMUSCULAR | Status: AC
  Administered 2018-12-18: 10 mg via INTRAVENOUS
  Filled 2018-12-18: qty 1

## 2018-12-18 MED ORDER — TRAZODONE HCL 100 MG PO TABS
100.0000 mg | ORAL_TABLET | Freq: Every day | ORAL | Status: DC
  Administered 2018-12-18 – 2018-12-19 (×2): 100 mg via ORAL
  Filled 2018-12-18 (×2): qty 1

## 2018-12-18 MED ORDER — ACETAMINOPHEN 325 MG PO TABS: 650.0000 mg | ORAL_TABLET | Freq: Four times a day (QID) | ORAL | Status: DC | PRN

## 2018-12-18 MED ORDER — SODIUM CHLORIDE 0.9% FLUSH: 3.0000 mL | Freq: Once | INTRAVENOUS | Status: DC

## 2018-12-18 MED ORDER — BENAZEPRIL HCL 20 MG PO TABS
40.0000 mg | ORAL_TABLET | Freq: Every day | ORAL | Status: DC
  Administered 2018-12-18 – 2018-12-20 (×3): 40 mg via ORAL
  Filled 2018-12-18 (×3): qty 2

## 2018-12-18 MED ORDER — ACETAMINOPHEN 650 MG RE SUPP: 650.0000 mg | Freq: Four times a day (QID) | RECTAL | Status: DC | PRN

## 2018-12-18 MED ORDER — LABETALOL HCL 5 MG/ML IV SOLN
10.0000 mg | INTRAVENOUS | Status: DC | PRN
  Administered 2018-12-19: 10 mg via INTRAVENOUS
  Filled 2018-12-18: qty 4

## 2018-12-18 MED ORDER — INSULIN ASPART 100 UNIT/ML ~~LOC~~ SOLN
0.0000 [IU] | Freq: Three times a day (TID) | SUBCUTANEOUS | Status: DC
  Administered 2018-12-19: 12:00:00 3 [IU] via SUBCUTANEOUS
  Administered 2018-12-19: 2 [IU] via SUBCUTANEOUS
  Administered 2018-12-19: 5 [IU] via SUBCUTANEOUS
  Administered 2018-12-20 (×2): 3 [IU] via SUBCUTANEOUS
  Filled 2018-12-18 (×5): qty 1

## 2018-12-18 MED ORDER — HYDRALAZINE HCL 20 MG/ML IJ SOLN
5.0000 mg | Freq: Once | INTRAMUSCULAR | Status: AC
  Administered 2018-12-18: 16:00:00 5 mg via INTRAVENOUS
  Filled 2018-12-18: qty 1

## 2018-12-18 MED ORDER — ASPIRIN EC 81 MG PO TBEC
81.0000 mg | DELAYED_RELEASE_TABLET | Freq: Every day | ORAL | Status: DC
  Administered 2018-12-19 – 2018-12-20 (×2): 81 mg via ORAL
  Filled 2018-12-18 (×2): qty 1

## 2018-12-18 MED ORDER — PROPRANOLOL HCL 20 MG PO TABS
20.0000 mg | ORAL_TABLET | Freq: Two times a day (BID) | ORAL | Status: DC
  Administered 2018-12-19: 20 mg via ORAL
  Filled 2018-12-18 (×3): qty 1

## 2018-12-18 MED ORDER — DARIFENACIN HYDROBROMIDE ER 7.5 MG PO TB24
15.0000 mg | ORAL_TABLET | Freq: Every day | ORAL | Status: DC
  Administered 2018-12-18 – 2018-12-20 (×3): 15 mg via ORAL
  Filled 2018-12-18: qty 2
  Filled 2018-12-18: qty 1
  Filled 2018-12-18 (×2): qty 2

## 2018-12-18 MED ORDER — HYDRALAZINE HCL 50 MG PO TABS
50.0000 mg | ORAL_TABLET | Freq: Once | ORAL | Status: AC
  Administered 2018-12-18: 16:00:00 50 mg via ORAL
  Filled 2018-12-18: qty 1

## 2018-12-18 MED ORDER — CITALOPRAM HYDROBROMIDE 20 MG PO TABS
40.0000 mg | ORAL_TABLET | Freq: Every day | ORAL | Status: DC
  Administered 2018-12-19 – 2018-12-20 (×2): 40 mg via ORAL
  Filled 2018-12-18 (×2): qty 2

## 2018-12-18 MED ORDER — INSULIN ASPART 100 UNIT/ML ~~LOC~~ SOLN
0.0000 [IU] | Freq: Every day | SUBCUTANEOUS | Status: DC
  Filled 2018-12-18: qty 1

## 2018-12-18 MED ORDER — HEPARIN (PORCINE) 25000 UT/250ML-% IV SOLN
1150.0000 [IU]/h | INTRAVENOUS | Status: DC
  Administered 2018-12-18: 1000 [IU]/h via INTRAVENOUS
  Administered 2018-12-19: 1150 [IU]/h via INTRAVENOUS
  Filled 2018-12-18 (×2): qty 250

## 2018-12-18 MED ORDER — ATORVASTATIN CALCIUM 20 MG PO TABS
40.0000 mg | ORAL_TABLET | Freq: Every day | ORAL | Status: DC
  Administered 2018-12-19: 40 mg via ORAL
  Filled 2018-12-18: qty 2

## 2018-12-18 MED ORDER — HEPARIN BOLUS VIA INFUSION
4000.0000 [IU] | Freq: Once | INTRAVENOUS | Status: AC
  Administered 2018-12-18: 17:00:00 4000 [IU] via INTRAVENOUS
  Filled 2018-12-18: qty 4000

## 2018-12-18 MED ORDER — LABETALOL HCL 5 MG/ML IV SOLN: 10.0000 mg | Freq: Once | INTRAVENOUS | Status: DC

## 2018-12-18 MED ORDER — ASPIRIN 81 MG PO CHEW
324.0000 mg | CHEWABLE_TABLET | Freq: Once | ORAL | Status: AC
  Administered 2018-12-18: 17:00:00 324 mg via ORAL
  Filled 2018-12-18: qty 4

## 2018-12-18 MED ORDER — POLYETHYLENE GLYCOL 3350 17 G PO PACK: 17.0000 g | PACK | Freq: Every day | ORAL | Status: DC | PRN

## 2018-12-18 MED ORDER — ONDANSETRON HCL 4 MG PO TABS: 4.0000 mg | ORAL_TABLET | Freq: Four times a day (QID) | ORAL | Status: DC | PRN

## 2018-12-18 MED ORDER — CLOPIDOGREL BISULFATE 75 MG PO TABS
75.0000 mg | ORAL_TABLET | Freq: Every day | ORAL | Status: DC
  Administered 2018-12-18 – 2018-12-20 (×3): 75 mg via ORAL
  Filled 2018-12-18 (×3): qty 1

## 2018-12-18 MED ORDER — CHLORTHALIDONE 25 MG PO TABS
25.0000 mg | ORAL_TABLET | Freq: Every day | ORAL | Status: DC
  Administered 2018-12-19 – 2018-12-20 (×2): 25 mg via ORAL
  Filled 2018-12-18 (×2): qty 1

## 2018-12-18 MED ORDER — HYDRALAZINE HCL 20 MG/ML IJ SOLN
5.0000 mg | INTRAMUSCULAR | Status: DC | PRN
  Administered 2018-12-19 (×2): 5 mg via INTRAVENOUS
  Filled 2018-12-18 (×2): qty 1

## 2018-12-18 MED ORDER — ONDANSETRON HCL 4 MG/2ML IJ SOLN
4.0000 mg | Freq: Four times a day (QID) | INTRAMUSCULAR | Status: DC | PRN
  Administered 2018-12-19: 4 mg via INTRAVENOUS
  Filled 2018-12-18: qty 2

## 2018-12-18 NOTE — Consult Note (Signed)
ANTICOAGULATION CONSULT NOTE - Initial Consult  Pharmacy Consult for Heparin Drip Indication: chest pain/ACS/STEMI  Allergies  Allergen Reactions  . Iodinated Diagnostic Agents Shortness Of Breath    Only with nuclear stress test per pt Only with nuclear stress test per pt   . Penicillins Rash    Rash at injection site. Pt states she can take Amoxicillin & Keflex   . Escitalopram   . Gluten Meal   . Lithium   . Wheat Bran   . Erythromycin Nausea Only and Nausea And Vomiting    Other reaction(s): Other (See Comments) dehydration     Patient Measurements: Height: 5\' 1"  (154.9 cm) Weight: 226 lb (102.5 kg) IBW/kg (Calculated) : 47.8 Heparin Dosing Weight: 72.6 kg  Vital Signs: Temp Source: Oral (04/22 1441) BP: 250/84 (04/22 1629) Pulse Rate: 61 (04/22 1601)  Labs: Recent Labs    12/18/18 1458  HGB 10.5*  HCT 31.7*  PLT 323  CREATININE 2.13*  TROPONINI 0.15*    Estimated Creatinine Clearance: 23.6 mL/min (A) (by C-G formula based on SCr of 2.13 mg/dL (H)).   Medical History: Past Medical History:  Diagnosis Date  . Arthritis   . Depression    controlled  . Diabetes mellitus without complication ([place])    managed well;   Marland Kitchen GERD (gastroesophageal reflux disease)   . Hepatitis   . HLD (hyperlipidemia)   . Hypertension    somewhat controlled  . 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 Polymyalgia rheumatica ([place])   . Sleep apnea   . Stroke [facility])     Medications:  DAPT pta, no anticoagulation  Assessment: Pharmacy has been consulted for heparin drip initiation and maintenance for this 78 yo F with ACS/STEMI.  Goal of Therapy:  Heparin level 0.3-0.7 units/ml Monitor platelets by anticoagulation protocol: Yes   Plan:  Will give IV bolus of 4000 units, followed by 1000 units/hr.  Will assess baseline CBC, APTT, and INR.  Will check HL in 8 hours per protocol/   Lu Duffel, PharmD, BCPS Clinical Pharmacist 12/18/2018 4:39  PM

## 2018-12-18 NOTE — ED Notes (Signed)
Pt up without assistance when this RN and Arby Barrette, RN entered room. Call bell was at bedside and pt was aware that she needed to call when she needed to get up. Pt used the bathroom without assistance and urine specimen was not collected at this time. MD aware. Pt assisted back to bed and in NAD at this time.

## 2018-12-18 NOTE — ED Notes (Signed)
Date and time results received: 12/18/18 3:59 PM  (use smartphrase ".now" to insert current time)  Test: Troponin Critical Value: 0.15  Name of Provider Notified: Dr. Joni Fears  Orders Received? Or Actions Taken?: Orders Received - See Orders for details

## 2018-12-18 NOTE — Progress Notes (Signed)
Family Meeting Note  Advance Directive:yes  Today a meeting took place with the Patient.  Patient is able to participate.  The following clinical team members were present during this meeting:MD  The following were discussed:Patient's diagnosis: hypertensive urgency, Patient's progosis: Unable to determine and Goals for treatment: Full Code  Additional follow-up to be provided: prn  Time spent during discussion:20 minutes  Evette Doffing, MD

## 2018-12-18 NOTE — ED Notes (Signed)
Pt refusing to keep bp cuff on at this time. MD aware.

## 2018-12-18 NOTE — H&P (Addendum)
[place] [facility] [place] NAME: Holly Conner    MR#:  [id number]  DATE OF BIRTH:  [birth year]  DATE OF ADMISSION:  12/18/2018  PRIMARY CARE PHYSICIAN: Dion Body, MD   REQUESTING/REFERRING PHYSICIAN: Carrie Mew, MD  CHIEF COMPLAINT:   Chief Complaint  Patient presents with  . Hypoglycemia    HISTORY OF PRESENT ILLNESS:  Holly Conner  is a 78 y.o. female with a known history of hypertension, hyperlipidemia, CKD 4, depression, anxiety, type 2 diabetes, OSA, history of stroke who presented to the ED with hypoglycemia.  She states that she checked her blood sugar this morning it was 65.  She felt very weak and tired.  She cannot get out of her bed.  EMS came into her house and tried to give her some orange juice and a bologna sandwich, but her blood sugar remained low.  She denies any chest pain, shortness of breath, nausea, vomiting, diaphoresis.  Patient states that she has been out of her benazepril for quite some time, because she lost it.  She was just able to get a refill of her benazepril yesterday and took her first dose yesterday evening.  In the ED, blood pressures were markedly elevated, up to 247/89.  EKG showed new T wave inversions in the inferolateral leads.  Labs were significant for creatinine 2.13, troponin 0.15.  CT had negative.  She was started on a heparin drip and hospitalists were called for admission.  PAST MEDICAL HISTORY:   Past Medical History:  Diagnosis Date  . Arthritis   . Depression    controlled  . Diabetes mellitus without complication ([place])    managed well;   Marland Kitchen GERD (gastroesophageal reflux disease)   . Hepatitis   . HLD (hyperlipidemia)   . Hypertension    somewhat controlled  . 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 Polymyalgia rheumatica ([place])   . Sleep apnea   . Stroke [facility])     PAST SURGICAL HISTORY:   Past Surgical History:  Procedure Laterality Date   . ABDOMINAL HYSTERECTOMY    . BILATERAL SALPINGOOPHORECTOMY    . ESOPHAGEAL DILATION    . OOPHORECTOMY    . REDUCTION MAMMAPLASTY Bilateral 2003  . REPLACEMENT TOTAL KNEE Right   . TONSILLECTOMY AND ADENOIDECTOMY      SOCIAL HISTORY:   Social History   Tobacco Use  . Smoking status: Never Smoker  . Smokeless tobacco: Never Used  Substance Use Topics  . Alcohol use: No    Alcohol/week: 0.0 standard drinks    FAMILY HISTORY:   Family History  Problem Relation Age of Onset  . CAD Brother   . Kidney cancer Mother   . Breast cancer Cousin     DRUG ALLERGIES:   Allergies  Allergen Reactions  . Iodinated Diagnostic Agents Shortness Of Breath    Only with nuclear stress test per pt Only with nuclear stress test per pt   . Penicillins Rash    Rash at injection site. Pt states she can take Amoxicillin & Keflex   . Escitalopram   . Gluten Meal   . Lithium   . Wheat Bran   . Erythromycin Nausea Only and Nausea And Vomiting    Other reaction(s): Other (See Comments) dehydration     REVIEW OF SYSTEMS:   Review of Systems  Constitutional: Positive for malaise/fatigue. Negative for chills and fever.  HENT: Negative for congestion and sore  throat.   Eyes: Negative for blurred vision and double vision.  Respiratory: Negative for cough and shortness of breath.   Cardiovascular: Negative for chest pain and palpitations.  Gastrointestinal: Negative for nausea and vomiting.  Genitourinary: Negative for dysuria and urgency.  Musculoskeletal: Negative for back pain and neck pain.  Neurological: Positive for weakness. Negative for dizziness, focal weakness and headaches.  Psychiatric/Behavioral: Negative for depression. The patient is not nervous/anxious.     MEDICATIONS AT HOME:   Prior to Admission medications   Medication Sig Start Date End Date Taking? Authorizing Provider  Ascorbic Acid (VITAMIN C) 1000 MG tablet Take 1,000 mg by mouth daily.    [provider]  aspirin EC 81 MG tablet Take by mouth.    [provider]  benazepril (LOTENSIN) 40 MG tablet Take 40 mg by mouth 2 (two) times daily.     [provider]  Blood Glucose Monitoring Suppl (FIFTY50 GLUCOSE METER 2.0) w/Device KIT Use as instructed. 10/26/14   [provider]  chlorthalidone (HYGROTON) 25 MG tablet chlorthalidone 25 mg tablet 05/24/18   [provider]  citalopram (CELEXA) 20 MG tablet Take 40 mg by mouth daily.     [provider]  clopidogrel (PLAVIX) 75 MG tablet Take 1 tablet (75 mg total) by mouth daily. 12/20/15   Henreitta Leber, MD  Continuous Blood Gluc Sensor (FREESTYLE LIBRE 14 DAY SENSOR) MISC USE AS DIRECTED EVERY 14 DAYS 07/05/18   [provider]  Cyanocobalamin (VITAMIN B-12 PO) Take by mouth.    [provider]  insulin aspart (NOVOLOG FLEXPEN) 100 UNIT/ML FlexPen Novolog Flexpen U-100 Insulin aspart 100 unit/mL (3 mL) subcutaneous 12/14/15   [provider]  Insulin Degludec (TRESIBA FLEXTOUCH) 200 UNIT/ML SOPN Tresiba FlexTouch U-200 insulin 200 unit/mL (3 mL) subcutaneous pen 04/03/16   [provider]  Insulin Pen Needle (FIFTY50 PEN NEEDLES) 31G X 8 MM MISC Use 1 needle to skin four times daily as directed with insulin pens 03/02/14   [provider]  liraglutide (VICTOZA) 18 MG/3ML SOPN Victoza 3-Pak 0.6 mg/0.1 mL (18 mg/3 mL) subcutaneous pen injector 09/13/15   [provider]  propranolol (INDERAL) 20 MG tablet Take 20 mg by mouth 2 (two) times daily.     [provider]  pyridOXINE (VITAMIN B-6) 100 MG tablet Take 100 mg by mouth daily.    [provider]  simvastatin (ZOCOR) 80 MG tablet simvastatin 80 mg tablet 04/23/18   [provider]  solifenacin (VESICARE) 10 MG tablet Take 1 tablet (10 mg total) by mouth daily. 10/11/18   Stoioff, Ronda Fairly, MD  traZODone (DESYREL) 100 MG tablet Take 100 mg by mouth at bedtime.    [provider]      VITAL SIGNS:  Blood pressure (!) 241/81, pulse 66, resp. rate (!) 21, height _0  (1.549 m), weight 102.5 kg, SpO2 95 %.  PHYSICAL EXAMINATION:  Physical Exam  GENERAL:  78 y.o.-year-old patient sitting up in the bed with no acute distress.  EYES: Pupils equal, round, reactive to light and accommodation. No scleral icterus. Extraocular muscles intact.  HEENT: Head atraumatic, normocephalic. Oropharynx and nasopharynx clear.  NECK:  Supple, no jugular venous distention. No thyroid enlargement, no tenderness.  LUNGS: Normal breath sounds bilaterally, no wheezing, rales,rhonchi or crepitation. No use of accessory muscles of respiration.  CARDIOVASCULAR: RRR, S1, S2 normal. No rubs, or gallops. III/VI systolic murmur ABDOMEN: Soft, nontender, nondistended. Bowel sounds present. No organomegaly or  mass.  EXTREMITIES: No pedal edema, cyanosis, or clubbing.  NEUROLOGIC: Cranial nerves II through XII are intact. Muscle strength 5/5 in all extremities. Sensation intact. Gait not checked.  PSYCHIATRIC: The patient is alert and oriented x 3.  SKIN: No obvious rash, lesion, or ulcer.   LABORATORY PANEL:   CBC Recent Labs  Lab 12/18/18 1458  WBC 9.8  HGB 10.5*  HCT 31.7*  PLT 323   ------------------------------------------------------------------------------------------------------------------  Chemistries  Recent Labs  Lab 12/18/18 1458  NA 141  K 4.3  CL 107  CO2 27  GLUCOSE 92  BUN 27*  CREATININE 2.13*  CALCIUM 9.4   ------------------------------------------------------------------------------------------------------------------  Cardiac Enzymes Recent Labs  Lab 12/18/18 1458  TROPONINI 0.15*   ------------------------------------------------------------------------------------------------------------------  RADIOLOGY:  Ct Head Wo Contrast  Result Date: 12/18/2018 CLINICAL DATA:  78 year old female with altered mental status and acute  lightheadedness. EXAM: CT HEAD WITHOUT CONTRAST TECHNIQUE: Contiguous axial images were obtained from the base of the skull through the vertex without intravenous contrast. COMPARISON:  12/20/2015 MR and 12/19/2015 CT FINDINGS: Brain: No evidence of acute infarction, hemorrhage, hydrocephalus, extra-axial collection or mass lesion/mass effect. Chronic small-vessel white matter ischemic changes again noted. Vascular: Carotid and vertebral atherosclerotic calcifications noted. Skull: Normal. Negative for fracture or focal lesion. Sinuses/Orbits: No acute finding. Other: None. IMPRESSION: 1. No evidence of acute intracranial abnormality 2. Chronic small-vessel white matter ischemic changes. Electronically Signed   By: Margarette Canada M.D.   On: 12/18/2018 15:50    IMPRESSION AND PLAN:   Hypertensive urgency- BP 240s/80s in the ED, likely related to patient being out of her benazepril. Also with elevated troponin and new T wave inversions in the inferolateral leads.  -Hydralazine and labetalol IV prn -Continue home benazepril, chlorthalidone, propranolol  Elevated troponin- likely demand ischemia in the setting of uncontrolled hypertension. No active chest pain. -Continue heparin drip for now -Trend troponins -Continue home aspirin and plavix -Cardiology consult -Repeat EKG in the morning  Chronic diastolic CHF- recent ECHO with EF >55% and grade 3-4 diastolic dysfunction. Does not appear volume overloaded -Continue meds as above  Type 2 diabetes-had a low blood sugar to 65 this morning. -Continue SSI  CKD 4-creatinine at baseline -Avoid nephrotoxic agents -Recheck Cr in the morning  Hyperlipidemia-stable -Continue home Lipitor  Depression/anxiety -Continue home Celexa   All the records are reviewed and case discussed with ED provider. Management plans discussed with the patient, family and they are in agreement.  CODE STATUS: Full  TOTAL TIME TAKING CARE OF THIS PATIENT: 45 minutes.     Berna Spare Jshawn Hurta M.D on 12/18/2018 at 6:02 PM  Between 7am to 6pm - Pager [phone]  After 6pm go to [email or web address] - Proofreader  [facility] [place] [facility]  Office  [phone]  CC: Primary care physician; Dion Body, MD   Note: This dictation was prepared with Dragon dictation along with smaller phrase technology. Any transcriptional errors that result from this process are unintentional.

## 2018-12-18 NOTE — ED Triage Notes (Signed)
Pt comes into the ED via EMS from home with c/o feeling lightheaded and just not right, EMS reports CBG 62, gave oral glucose and food, then 67, started IV and gave D10 CBG then 324 but the pt reported still not feeling well and wanted to be seen in the ED< on arrival CBG 96. Pt denies any lightheadedness at present. Just wants to be evaluated.

## 2018-12-18 NOTE — ED Provider Notes (Signed)
[facility] Emergency Department Provider Note  ____________________________________________  Time seen: Approximately 4:13 PM  I have reviewed the triage vital signs and the nursing notes.   HISTORY  Chief Complaint Hypoglycemia    HPI Holly Conner is a 78 y.o. female with a history of diabetes hypertension CKD, stroke who complains of generalized weakness that started this morning.  Accompanied by malaise.  This morning she noticed that her blood sugar was low at about 60.  She thought this was the cause of her symptoms so she had some orange juice and glucose tablets.  EMS came and gave her supplemental glucose as well which increased her blood sugar to normal, but she did not feel any better so they brought her to the ED for evaluation.  She denies any headache vision changes or recent head trauma.  No chest pain shortness of breath back pain or abdominal pain.  Denies any focal weakness or paresthesia but does report that she has felt unsteady on her feet today.  She has been compliant with all her medications except that she has not taken her hydralazine today, and her daily benazepril is not due until bed time.      Past Medical History:  Diagnosis Date  . Arthritis   . Depression    controlled  . Diabetes mellitus without complication ([place])    managed well;   Marland Kitchen GERD (gastroesophageal reflux disease)   . Hepatitis   . HLD (hyperlipidemia)   . Hypertension    somewhat controlled  . 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 Polymyalgia rheumatica ([place])   . Sleep apnea   . Stroke [facility])      Patient Active Problem List   Diagnosis Date Noted  . Stroke ([place]) 12/19/2015  . Type 2 diabetes mellitus ([place]) 12/19/2015  . HTN (hypertension) 12/19/2015  . GERD (gastroesophageal reflux disease) 12/19/2015  . HLD (hyperlipidemia) 12/19/2015  . CKD (chronic kidney disease), stage IV ([place]) 12/19/2015     Past Surgical History:   Procedure Laterality Date  . ABDOMINAL HYSTERECTOMY    . BILATERAL SALPINGOOPHORECTOMY    . ESOPHAGEAL DILATION    . OOPHORECTOMY    . REDUCTION MAMMAPLASTY Bilateral 2003  . REPLACEMENT TOTAL KNEE Right   . TONSILLECTOMY AND ADENOIDECTOMY       Prior to Admission medications   Medication Sig Start Date End Date Taking? Authorizing Provider  Ascorbic Acid (VITAMIN C) 1000 MG tablet Take 1,000 mg by mouth daily.    [provider]  aspirin EC 81 MG tablet Take by mouth.    [provider]  benazepril (LOTENSIN) 40 MG tablet Take 40 mg by mouth 2 (two) times daily.     [provider]  Blood Glucose Monitoring Suppl (FIFTY50 GLUCOSE METER 2.0) w/Device KIT Use as instructed. 10/26/14   [provider]  chlorthalidone (HYGROTON) 25 MG tablet chlorthalidone 25 mg tablet 05/24/18   [provider]  citalopram (CELEXA) 20 MG tablet Take 40 mg by mouth daily.     [provider]  clopidogrel (PLAVIX) 75 MG tablet Take 1 tablet (75 mg total) by mouth daily. 12/20/15   Henreitta Leber, MD  Continuous Blood Gluc Sensor (FREESTYLE LIBRE 14 DAY SENSOR) MISC USE AS DIRECTED EVERY 14 DAYS 07/05/18   [provider]  Cyanocobalamin (VITAMIN B-12 PO) Take by mouth.    [provider]  insulin aspart (NOVOLOG FLEXPEN) 100 UNIT/ML FlexPen Novolog Flexpen  U-100 Insulin aspart 100 unit/mL (3 mL) subcutaneous 12/14/15   [provider]  Insulin Degludec (TRESIBA FLEXTOUCH) 200 UNIT/ML SOPN Tresiba FlexTouch U-200 insulin 200 unit/mL (3 mL) subcutaneous pen 04/03/16   [provider]  Insulin Pen Needle (FIFTY50 PEN NEEDLES) 31G X 8 MM MISC Use 1 needle to skin four times daily as directed with insulin pens 03/02/14   [provider]  liraglutide (VICTOZA) 18 MG/3ML SOPN Victoza 3-Pak 0.6 mg/0.1 mL (18 mg/3 mL) subcutaneous pen injector 09/13/15   [provider]  propranolol (INDERAL) 20 MG tablet Take 20 mg by  mouth 2 (two) times daily.     [provider]  pyridOXINE (VITAMIN B-6) 100 MG tablet Take 100 mg by mouth daily.    [provider]  simvastatin (ZOCOR) 80 MG tablet simvastatin 80 mg tablet 04/23/18   [provider]  solifenacin (VESICARE) 10 MG tablet Take 1 tablet (10 mg total) by mouth daily. 10/11/18   Stoioff, Ronda Fairly, MD  traZODone (DESYREL) 100 MG tablet Take 100 mg by mouth at bedtime.    [provider]     Allergies Iodinated diagnostic agents; Penicillins; Escitalopram; Gluten meal; Lithium; Wheat bran; and Erythromycin   Family History  Problem Relation Age of Onset  . CAD Brother   . Kidney cancer Mother   . Breast cancer Cousin     Social History Social History   Tobacco Use  . Smoking status: Never Smoker  . Smokeless tobacco: Never Used  Substance Use Topics  . Alcohol use: No    Alcohol/week: 0.0 standard drinks  . Drug use: No    Review of Systems  Constitutional:   No fever or chills.  ENT:   No sore throat. No rhinorrhea. Cardiovascular:   No chest pain or syncope. Respiratory:   No dyspnea or cough. Gastrointestinal:   Negative for abdominal pain, vomiting and diarrhea.  Musculoskeletal:   Negative for focal pain or swelling All other systems reviewed and are negative except as documented above in ROS and HPI.  ____________________________________________   PHYSICAL EXAM:  VITAL SIGNS: ED Triage Vitals  Enc Vitals Group     BP 12/18/18 1441 (!) 249/82     Pulse Rate 12/18/18 1441 (!) 54     Resp 12/18/18 1441 18     Temp --      Temp Source 12/18/18 1441 Oral     SpO2 12/18/18 1441 96 %     Weight 12/18/18 1443 226 lb (102.5 kg)     Height 12/18/18 1443 _0  (1.549 m)     Head Circumference --      Peak Flow --      Pain Score 12/18/18 1442 0     Pain Loc --      Pain Edu? --      Excl. in [place]? --     Vital signs reviewed, nursing assessments reviewed.   Constitutional:   Alert and oriented.  Non-toxic appearance. Eyes:   Conjunctivae are normal. EOMI. PERRL.  No nystagmus ENT      Head:   Normocephalic and atraumatic.      Nose:   No congestion/rhinnorhea.       Mouth/Throat:   MMM, no pharyngeal erythema. No peritonsillar mass.       Neck:   No meningismus. Full ROM. Hematological/Lymphatic/Immunilogical:   No cervical lymphadenopathy. Cardiovascular:   RRR. Symmetric bilateral radial and DP pulses.  No murmurs. Cap refill less than 2 seconds. Respiratory:  Normal respiratory effort without tachypnea/retractions. Breath sounds are clear and equal bilaterally. No wheezes/rales/rhonchi. Gastrointestinal:   Soft and nontender. Non distended. There is no CVA tenderness.  No rebound, rigidity, or guarding.  Musculoskeletal:   Normal range of motion in all extremities. No joint effusions.  No lower extremity tenderness.  Trace peripheral edema. Neurologic:   Normal speech and language.  Motor grossly intact. No acute focal neurologic deficits are appreciated.  Skin:    Skin is warm, dry and intact. No rash noted.  No petechiae, purpura, or bullae.  ____________________________________________    LABS (pertinent positives/negatives) (all labs ordered are listed, but only abnormal results are displayed) Labs Reviewed  BASIC METABOLIC PANEL - Abnormal; Notable for the following components:      Result Value   BUN 27 (*)    Creatinine, Ser 2.13 (*)    GFR calc non Af Amer 21 (*)    GFR calc Af Amer 25 (*)    All other components within normal limits  CBC - Abnormal; Notable for the following components:   RBC 3.52 (*)    Hemoglobin 10.5 (*)    HCT 31.7 (*)    All other components within normal limits  TROPONIN I - Abnormal; Notable for the following components:   Troponin I 0.15 (*)    All other components within normal limits  GLUCOSE, CAPILLARY  URINALYSIS, COMPLETE (UACMP) WITH MICROSCOPIC  CBG MONITORING, ED    ____________________________________________   EKG  Interpreted by me Sinus bradycardia rate of 53, left axis, first-degree AV block.  Poor R wave progression.  Normal ST segments.  LVH.  Diffuse T wave inversions in the inferior and lateral leads.  Significantly changed compared to previous.  ____________________________________________    RADIOLOGY  Ct Head Wo Contrast  Result Date: 12/18/2018 CLINICAL DATA:  78 year old female with altered mental status and acute lightheadedness. EXAM: CT HEAD WITHOUT CONTRAST TECHNIQUE: Contiguous axial images were obtained from the base of the skull through the vertex without intravenous contrast. COMPARISON:  12/20/2015 MR and 12/19/2015 CT FINDINGS: Brain: No evidence of acute infarction, hemorrhage, hydrocephalus, extra-axial collection or mass lesion/mass effect. Chronic small-vessel white matter ischemic changes again noted. Vascular: Carotid and vertebral atherosclerotic calcifications noted. Skull: Normal. Negative for fracture or focal lesion. Sinuses/Orbits: No acute finding. Other: None. IMPRESSION: 1. No evidence of acute intracranial abnormality 2. Chronic small-vessel white matter ischemic changes. Electronically Signed   By: Margarette Canada M.D.   On: 12/18/2018 15:50    ____________________________________________   PROCEDURES .Critical Care Performed by: Carrie Mew, MD Authorized by: Carrie Mew, MD   Critical care provider statement:    Critical care time (minutes):  35   Critical care time was exclusive of:  Separately billable procedures and treating other patients   Critical care was necessary to treat or prevent imminent or life-threatening deterioration of the following conditions:  Circulatory failure and cardiac failure   Critical care was time spent personally by me on the following activities:  Development of treatment plan with patient or surrogate, discussions with consultants, evaluation of patient's  response to treatment, examination of patient, obtaining history from patient or surrogate, ordering and performing treatments and interventions, ordering and review of laboratory studies, ordering and review of radiographic studies, pulse oximetry, re-evaluation of patient's condition and review of old charts    ____________________________________________  DIFFERENTIAL DIAGNOSIS   Intracranial hemorrhage, non-STEMI, hypertensive urgency.  Doubt dissection PE pneumothorax pericarditis or acute heart failure.  CLINICAL IMPRESSION / ASSESSMENT AND  PLAN / ED COURSE  Medications ordered in the ED: Medications  sodium chloride flush (NS) 0.9 % injection 3 mL (has no administration in time range)  aspirin chewable tablet 324 mg (has no administration in time range)  heparin bolus via infusion 4,000 Units (has no administration in time range)    Followed by  heparin ADULT infusion 100 units/mL (25000 units/246m sodium chloride 0.45%) (has no administration in time range)  hydrALAZINE (APRESOLINE) tablet 50 mg (50 mg Oral Given 12/18/18 1602)  hydrALAZINE (APRESOLINE) injection 5 mg (5 mg Intravenous Given 12/18/18 1629)    Pertinent labs & imaging results that were available during my care of the patient were reviewed by me and considered in my medical decision making (see chart for details).  Holly Noldenwas evaluated in Emergency Department on 12/18/2018 for the symptoms described in the history of present illness. She was evaluated in the context of the global COVID-19 pandemic, which necessitated consideration that the patient might be at risk for infection with the SARS-CoV-2 virus that causes COVID-19. Institutional protocols and algorithms that pertain to the evaluation of patients at risk for COVID-19 are in a state of rapid change based on information released by regulatory bodies including the CDC and federal and state organizations. These policies and algorithms were followed during  the patient's care in the ED.   Patient presents with generalized weakness in the setting of severely uncontrolled hypertension today.  Due to her subjective neurologic symptoms, CT scan obtained which was negative for intracranial hemorrhage.  EKG shows worrisome T wave changes, and troponin has been elevated to 0.15, possibly due to non-STEMI versus myocardial strain from high afterload.    Patient was given her oral hydralazine dose, and I have ordered IV hydralazine as well for more expeditious control of her blood pressure.  She will need to be hospitalized for further monitoring and management of blood pressure.  We will give full dose aspirin and start a heparin drip in the meantime as well, cardioprotection pending further cardiac work-up and evaluation.  Clinical Course as of Dec 18 1635  Wed Dec 18, 2018  1541 Platelets: 323 [PS]    Clinical Course User Index [PS] SCarrie Mew MD     ____________________________________________   FINAL CLINICAL IMPRESSION(S) / ED DIAGNOSES    Final diagnoses:  Hypertensive urgency  NSTEMI (non-ST elevated myocardial infarction) ([facility]  Generalized weakness     ED Discharge Orders    None      Portions of this note were generated with dragon dictation software. Dictation errors may occur despite best attempts at proofreading.   SCarrie Mew MD 12/18/18 [phone]

## 2018-12-19 ENCOUNTER — Inpatient Hospital Stay: Admit: 2018-12-19 | Payer: Medicare Other

## 2018-12-19 ENCOUNTER — Inpatient Hospital Stay: Payer: Medicare Other

## 2018-12-19 LAB — GLUCOSE, CAPILLARY
Glucose-Capillary: 200 mg/dL — ABNORMAL HIGH (ref 70–99)
Glucose-Capillary: 206 mg/dL — ABNORMAL HIGH (ref 70–99)
Glucose-Capillary: 185 mg/dL — ABNORMAL HIGH (ref 70–99)
Glucose-Capillary: 126 mg/dL — ABNORMAL HIGH (ref 70–99)
Glucose-Capillary: 142 mg/dL — ABNORMAL HIGH (ref 70–99)
Glucose-Capillary: 144 mg/dL — ABNORMAL HIGH (ref 70–99)

## 2018-12-19 LAB — CBC
HCT: 31.3 % — ABNORMAL LOW (ref 36.0–46.0)
HCT: 30.2 % — ABNORMAL LOW (ref 36.0–46.0)
Hemoglobin: 10.5 g/dL — ABNORMAL LOW (ref 12.0–15.0)
Hemoglobin: 9.9 g/dL — ABNORMAL LOW (ref 12.0–15.0)
MCH: 29.7 pg (ref 26.0–34.0)
MCH: 29.6 pg (ref 26.0–34.0)
MCHC: 33.5 g/dL (ref 30.0–36.0)
MCHC: 32.8 g/dL (ref 30.0–36.0)
MCV: 88.7 fL (ref 80.0–100.0)
MCV: 90.4 fL (ref 80.0–100.0)
Platelets: 315 10*3/uL (ref 150–400)
Platelets: 299 10*3/uL (ref 150–400)
RBC: 3.53 MIL/uL — ABNORMAL LOW (ref 3.87–5.11)
RBC: 3.34 MIL/uL — ABNORMAL LOW (ref 3.87–5.11)
RDW: 13.3 % (ref 11.5–15.5)
RDW: 13.4 % (ref 11.5–15.5)
WBC: 10.7 10*3/uL — ABNORMAL HIGH (ref 4.0–10.5)
WBC: 8.8 10*3/uL (ref 4.0–10.5)
nRBC: 0 % (ref 0.0–0.2)
nRBC: 0 % (ref 0.0–0.2)

## 2018-12-19 LAB — BASIC METABOLIC PANEL
Anion gap: 9 (ref 5–15)
BUN: 30 mg/dL — ABNORMAL HIGH (ref 8–23)
CO2: 23 mmol/L (ref 22–32)
Calcium: 9 mg/dL (ref 8.9–10.3)
Chloride: 108 mmol/L (ref 98–111)
Creatinine, Ser: 2.14 mg/dL — ABNORMAL HIGH (ref 0.44–1.00)
GFR calc Af Amer: 25 mL/min — ABNORMAL LOW (ref >60)
GFR calc non Af Amer: 21 mL/min — ABNORMAL LOW (ref >60)
Glucose, Bld: 202 mg/dL — ABNORMAL HIGH (ref 70–99)
Potassium: 4.2 mmol/L (ref 3.5–5.1)
Sodium: 140 mmol/L (ref 135–145)

## 2018-12-19 LAB — TROPONIN I
Troponin I: 0.09 ng/mL (ref <0.03)
Troponin I: 0.08 ng/mL (ref <0.03)

## 2018-12-19 LAB — HEPARIN LEVEL (UNFRACTIONATED)
Heparin Unfractionated: 0.29 IU/mL — ABNORMAL LOW (ref 0.30–0.70)
Heparin Unfractionated: 0.45 IU/mL (ref 0.30–0.70)
Heparin Unfractionated: 0.39 IU/mL (ref 0.30–0.70)

## 2018-12-19 MED ORDER — HEPARIN BOLUS VIA INFUSION
1100.0000 [IU] | Freq: Once | INTRAVENOUS | Status: AC
  Administered 2018-12-19: 02:00:00 1100 [IU] via INTRAVENOUS
  Filled 2018-12-19: qty 1100

## 2018-12-19 MED ORDER — PROPRANOLOL HCL 40 MG PO TABS
40.0000 mg | ORAL_TABLET | Freq: Two times a day (BID) | ORAL | Status: DC
  Administered 2018-12-19 – 2018-12-20 (×2): 40 mg via ORAL
  Filled 2018-12-19 (×3): qty 1

## 2018-12-19 MED ORDER — IPRATROPIUM-ALBUTEROL 0.5-2.5 (3) MG/3ML IN SOLN
3.0000 mL | Freq: Four times a day (QID) | RESPIRATORY_TRACT | Status: DC | PRN
  Administered 2018-12-19: 02:00:00 3 mL via RESPIRATORY_TRACT

## 2018-12-19 MED ORDER — IPRATROPIUM-ALBUTEROL 0.5-2.5 (3) MG/3ML IN SOLN
RESPIRATORY_TRACT | Status: AC
  Filled 2018-12-19: qty 3

## 2018-12-19 NOTE — Consult Note (Signed)
ANTICOAGULATION CONSULT NOTE - Initial Consult  Pharmacy Consult for Heparin Drip Indication: chest pain/ACS/STEMI  Allergies  Allergen Reactions  . Iodinated Diagnostic Agents Shortness Of Breath    Only with nuclear stress test per pt Only with nuclear stress test per pt   . Penicillins Rash    Rash at injection site. Pt states she can take Amoxicillin & Keflex   . Escitalopram   . Gluten Meal   . Lithium   . Wheat Bran   . Erythromycin Nausea Only and Nausea And Vomiting    Other reaction(s): Other (See Comments) dehydration     Patient Measurements: Height: 5\' 1"  (154.9 cm) Weight: 226 lb (102.5 kg) IBW/kg (Calculated) : 47.8 Heparin Dosing Weight: 72.6 kg  Vital Signs: Temp: 98.1 F (36.7 C) (04/23 0825) Temp Source: Oral (04/23 0825) BP: 192/88 (04/23 0825) Pulse Rate: 73 (04/23 0825)  Labs: Recent Labs    12/18/18 1458 12/18/18 1654 12/18/18 2052 12/19/18 0116 12/19/18 0256 12/19/18 1002  HGB 10.5*  --   --   --  10.5* 9.9*  HCT 31.7*  --   --   --  31.3* 30.2*  PLT 323  --   --   --  315 299  APTT  --  34  --   --   --   --   LABPROT  --  13.1  --   --   --   --   INR  --  1.0  --   --   --   --   HEPARINUNFRC  --   --   --  0.29*  --  0.45  CREATININE 2.13*  --   --   --  2.14*  --   TROPONINI 0.15*  --  0.11*  --  0.09*  --     Estimated Creatinine Clearance: 23.5 mL/min (A) (by C-G formula based on SCr of 2.14 mg/dL (H)).   Medical History: Past Medical History:  Diagnosis Date  . Arthritis   . Depression    controlled  . Diabetes mellitus without complication ([place])    managed well;   Marland Kitchen GERD (gastroesophageal reflux disease)   . Hepatitis   . HLD (hyperlipidemia)   . Hypertension    somewhat controlled  . 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 Polymyalgia rheumatica ([place])   . Sleep apnea   . Stroke [facility])     Medications:  DAPT pta, no anticoagulation  Assessment: Pharmacy has been consulted for heparin  drip initiation and maintenance for this 78 yo F with ACS/STEMI.  4/23 @0116  HL 0.29 rebolus w/ heparin 1100 units IV x 1 and increase rate to 1150 units/hr 4/23 @1002  HL 0.45 continue current rate.   Goal of Therapy:  Heparin level 0.3-0.7 units/ml Monitor platelets by anticoagulation protocol: Yes   Plan:  HL therapeutic. Will continue current rate. Order aPTT in 8 hours. Hgb slight downward trend continue to monitor.   Oswald Hillock, PharmD, BCPS Clinical Pharmacist 12/19/2018 10:57 AM

## 2018-12-19 NOTE — Consult Note (Signed)
[facility] Cardiology  CARDIOLOGY CONSULT NOTE  Patient ID: Holly Conner MRN: [id number] DOB/AGE: 04/04/1941 78 y.o.  Admit date: 12/18/2018 Referring Physician [facility] Primary Physician [facility] Primary Cardiologist Fath Reason for Consultation Elevated troponin  HPI: 78 year old female referred for evaluation of elevated troponin. The patient has a history of chronic diastolic CHF, type 2 diabetes, CKD stage 4, hypertension, hyperlipidemia, sleep apnea, chronic insomnia, and history of TIA. She reports that yesterday morning, she woke up feeling generalized weakness and fatigue, unable to get out of bed, with body aches and chills. She checked her blood sugar and noted that it was in the 60s. She called EMS, who also noted her blood sugar to be low, and despite giving her orange juice and a sandwich, it did not improve. Due to her weakness and hypoglycemia, she was transported to [facility] ER for further evaluation. The patient denies experiencing any chest pain, though had some shortness of breath and was placed on supplemental oxygen via nasal cannula. In the ED, the patient's blood pressure was markedly elevated to 247/89. Of note, the patient had not taken her benazepril in about 2 weeks due to losing her prescription. ECG revealed sinus rhythm with new inferolateral T wave abnormalities with LVH, though compared to prior ECGs, she has a baseline abnormal ECG. Head CT negative for acute abnormality. Admission labs are notable for creatinine 2.14, BUN 30, GFR 21, and borderline elevated troponin of 0.15, 0.11, followed by 0.09. The patient was started on heparin drip. Currently, she reports feeling somewhat better, but states that it is difficult to tell at this time since she has not ambulated yet, and did not sleep last night. She denies chest pain. She denies shortness of breath at this time while on supplemental oxygen. She has chronic pedal edema. 2D echocardiogram on 12/11/2018 revealed normal left  ventricular function with LVEF greater than 93%, diastolic dysfunction, moderate LVH, with moderate mitral regurgitation and mild tricuspid regurgitation.   Review of systems complete and found to be negative unless listed above     Past Medical History:  Diagnosis Date  . Arthritis   . Depression    controlled  . Diabetes mellitus without complication ([place])    managed well;   Marland Kitchen GERD (gastroesophageal reflux disease)   . Hepatitis   . HLD (hyperlipidemia)   . Hypertension    somewhat controlled  . 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 Polymyalgia rheumatica ([place])   . Sleep apnea   . Stroke [facility])     Past Surgical History:  Procedure Laterality Date  . ABDOMINAL HYSTERECTOMY    . BILATERAL SALPINGOOPHORECTOMY    . ESOPHAGEAL DILATION    . OOPHORECTOMY    . REDUCTION MAMMAPLASTY Bilateral 2003  . REPLACEMENT TOTAL KNEE Right   . TONSILLECTOMY AND ADENOIDECTOMY      Medications Prior to Admission  Medication Sig Dispense Refill Last Dose  . atorvastatin (LIPITOR) 40 MG tablet Take 40 mg by mouth daily.     . Blood Glucose Monitoring Suppl (FIFTY50 GLUCOSE METER 2.0) w/Device KIT Use as instructed.   12/18/2018 at Unknown time  . chlorthalidone (HYGROTON) 25 MG tablet chlorthalidone 25 mg tablet   12/18/2018 at Unknown time  . citalopram (CELEXA) 20 MG tablet Take 40 mg by mouth daily.    12/18/2018 at Unknown time  . Continuous Blood Gluc Sensor (FREESTYLE LIBRE 14 DAY SENSOR) MISC USE AS DIRECTED EVERY 14 DAYS  5 12/18/2018 at Unknown time  .  fluticasone (FLONASE) 50 MCG/ACT nasal spray Place 2 sprays into both nostrils at bedtime.     . insulin aspart (NOVOLOG FLEXPEN) 100 UNIT/ML FlexPen Novolog Flexpen U-100 Insulin aspart 100 unit/mL (3 mL) subcutaneous   12/18/2018 at Unknown time  . Insulin Degludec (TRESIBA FLEXTOUCH) 200 UNIT/ML SOPN Tresiba FlexTouch U-200 insulin 200 unit/mL (3 mL) subcutaneous pen   12/18/2018 at Unknown time  . Insulin Pen Needle  (FIFTY50 PEN NEEDLES) 31G X 8 MM MISC Use 1 needle to skin four times daily as directed with insulin pens   12/18/2018 at Unknown time  . liraglutide (VICTOZA) 18 MG/3ML SOPN Victoza 3-Pak 0.6 mg/0.1 mL (18 mg/3 mL) subcutaneous pen injector   12/18/2018 at Unknown time  . loratadine (CLARITIN) 10 MG tablet Take 10 mg by mouth daily as needed for allergies.     Marland Kitchen propranolol (INDERAL) 20 MG tablet Take 20 mg by mouth 2 (two) times daily.    12/17/2018 at Unknown time  . solifenacin (VESICARE) 10 MG tablet Take 1 tablet (10 mg total) by mouth daily. 90 tablet 3 12/17/2018 at Unknown time  . traZODone (DESYREL) 100 MG tablet Take 100 mg by mouth at bedtime.   12/17/2018 at Unknown time  . Ascorbic Acid (VITAMIN C) 1000 MG tablet Take 1,000 mg by mouth daily.   Taking  . aspirin EC 81 MG tablet Take by mouth.   Taking  . benazepril (LOTENSIN) 40 MG tablet Take 40 mg by mouth 2 (two) times daily.    12/17/2018 at 2100  . clopidogrel (PLAVIX) 75 MG tablet Take 1 tablet (75 mg total) by mouth daily. 60 tablet 1 Taking  . Cyanocobalamin (VITAMIN B-12 PO) Take by mouth.   Taking  . pyridOXINE (VITAMIN B-6) 100 MG tablet Take 100 mg by mouth daily.   Taking  . simvastatin (ZOCOR) 80 MG tablet simvastatin 80 mg tablet   Not Taking at Unknown time   Social History   Socioeconomic History  . Marital status: Single    Spouse name: Not on file  . Number of children: Not on file  . Years of education: Not on file  . Highest education level: Not on file  Occupational History  . Not on file  Social Needs  . Financial resource strain: Not on file  . Food insecurity:    Worry: Not on file    Inability: Not on file  . Transportation needs:    Medical: Not on file    Non-medical: Not on file  Tobacco Use  . Smoking status: Never Smoker  . Smokeless tobacco: Never Used  Substance and Sexual Activity  . Alcohol use: No    Alcohol/week: 0.0 standard drinks  . Drug use: No  . Sexual activity: Yes    Birth  control/protection: Surgical  Lifestyle  . Physical activity:    Days per week: Not on file    Minutes per session: Not on file  . Stress: Not on file  Relationships  . Social connections:    Talks on phone: Not on file    Gets together: Not on file    Attends religious service: Not on file    Active member of club or organization: Not on file    Attends meetings of clubs or organizations: Not on file    Relationship status: Not on file  . Intimate partner violence:    Fear of current or ex partner: Not on file    Emotionally abused: Not on file  Physically abused: Not on file    Forced sexual activity: Not on file  Other Topics Concern  . Not on file  Social History Narrative  . Not on file    Family History  Problem Relation Age of Onset  . CAD Brother   . Kidney cancer Mother   . Breast cancer Cousin       Review of systems complete and found to be negative unless listed above      PHYSICAL EXAM  General: Well developed, well nourished, obese female, sitting on side of bed preparing to eat breakfast, in no acute distress HEENT:  Normocephalic and atramatic Neck:  No JVD.  Lungs: Decreased bibasilar breath sounds, no wheezing or crackles, normal effort of breathing on supplemental oxygen Heart: HRRR . 2/6 mid systolic murmur.  Abdomen: nondistended Msk:  No obvious deformity, gait not assessed Extremities: No clubbing, cyanosis. Mild bilateral pedal edema.   Neuro: Alert and oriented X 3. Psych:  Good affect, responds appropriately  Labs:   Lab Results  Component Value Date   WBC 10.7 (H) 12/19/2018   HGB 10.5 (L) 12/19/2018   HCT 31.3 (L) 12/19/2018   MCV 88.7 12/19/2018   PLT 315 12/19/2018    Recent Labs  Lab 12/19/18 0256  NA 140  K 4.2  CL 108  CO2 23  BUN 30*  CREATININE 2.14*  CALCIUM 9.0  GLUCOSE 202*   Lab Results  Component Value Date   CKTOTAL 259 (H) 11/10/2012   CKMB 3.4 11/10/2012   TROPONINI 0.09 (HH) 12/19/2018    Lab  Results  Component Value Date   CHOL 189 12/20/2015   Lab Results  Component Value Date   HDL 41 12/20/2015   Lab Results  Component Value Date   LDLCALC 110 (H) 12/20/2015   Lab Results  Component Value Date   TRIG 191 (H) 12/20/2015   Lab Results  Component Value Date   CHOLHDL 4.6 12/20/2015   No results found for: LDLDIRECT    Radiology: Ct Head Wo Contrast  Result Date: 12/18/2018 CLINICAL DATA:  78 year old female with altered mental status and acute lightheadedness. EXAM: CT HEAD WITHOUT CONTRAST TECHNIQUE: Contiguous axial images were obtained from the base of the skull through the vertex without intravenous contrast. COMPARISON:  12/20/2015 MR and 12/19/2015 CT FINDINGS: Brain: No evidence of acute infarction, hemorrhage, hydrocephalus, extra-axial collection or mass lesion/mass effect. Chronic small-vessel white matter ischemic changes again noted. Vascular: Carotid and vertebral atherosclerotic calcifications noted. Skull: Normal. Negative for fracture or focal lesion. Sinuses/Orbits: No acute finding. Other: None. IMPRESSION: 1. No evidence of acute intracranial abnormality 2. Chronic small-vessel white matter ischemic changes. Electronically Signed   By: Margarette Canada M.D.   On: 12/18/2018 15:50    EKG: Sinus rhythm  ASSESSMENT AND PLAN:  1. Borderline elevated troponin in the absence of chest pain, in the setting of CKD stage 4, hypoglycemia, and hypertensive urgency, with ECG changes, though abnormal at baseline, likely demand supply ischemia. Troponin trending down from 0.15, 0.11, followed by 0.09. On heparin drip. 2. Hypertensive urgency, likely due to not taking her benazepril for 2 weeks 3. CKD stage 4 4. Hypoglycemia with history of type 2 diabetes 5. Hyperlipidemia, on statin    Plan: 1. Defer cardiac catheterization at this time despite ECG changes, in light of Covid-19 pandemic, CKD stage 4, and absence of chest pain. 2. Chest xray due to shortness of  breath requiring supplemental oxygen 3. Continue heparin for a total of  36-48 hours, likely stop in the morning. 4. Continue chlorthalidone, benazepril, and propanolol. Resume home hydralazine 5. Defer repeating echocardiogram as patient just had one on 12/11/2018 6. If patient has no ischemic symptoms, and does well tomorrow, will likely discharge tomorrow.  Signed: Clabe Seal MD,PhD, [facility] 12/19/2018, 8:25 AM  This patient encounter, including ECGs, were discussed with Dr. Saralyn Pilar, who also evaluated the patient, and the plan was made in collaboration with him.

## 2018-12-19 NOTE — Progress Notes (Signed)
Inpatient Diabetes Program Recommendations  AACE/ADA: New Consensus Statement on Inpatient Glycemic Control (2015)  Target Ranges:  Prepandial:   less than 140 mg/dL      Peak postprandial:   less than 180 mg/dL (1-2 hours)      Critically ill patients:  140 - 180 mg/dL   Lab Results  Component Value Date   GLUCAP 206 (H) 12/19/2018   HGBA1C 6.9 (H) 12/20/2015    Review of Glycemic Control Results for KYMIA, SIMI (MRN [id number]) as of 12/19/2018 11:43  Ref. Range 12/18/2018 14:39 12/18/2018 20:46 12/19/2018 01:00 12/19/2018 08:26  Glucose-Capillary Latest Ref Range: 70 - 99 mg/dL 96 226 (H) 200 (H) 206 (H)   Diabetes history: DM 2 Outpatient Diabetes medications:  Tresiba 24 units daily, Victoza 1.2 mg daily, Novolog 7-9 units tid with meals Current orders for Inpatient glycemic control:  Novolog moderate tid with meals and HS  Inpatient Diabetes Program Recommendations:   Consider adding Lantus 12 units daily.   Thanks,  Adah Perl, RN, BC-ADM Inpatient Diabetes Coordinator Pager [phone] (8a-5p)

## 2018-12-19 NOTE — Progress Notes (Signed)
[facility] [place] NAME: Holly Conner    MR#:  [id number]  DATE OF BIRTH:  [birth year]  SUBJECTIVE:  CHIEF COMPLAINT:   Patient is resting comfortably.  Denies any chest pain or shortness of breath REVIEW OF SYSTEMS:  CONSTITUTIONAL: No fever, fatigue or weakness.  EYES: No blurred or double vision.  EARS, NOSE, AND THROAT: No tinnitus or ear pain.  RESPIRATORY: No cough, shortness of breath, wheezing or hemoptysis.  CARDIOVASCULAR: No chest pain, orthopnea, edema.  GASTROINTESTINAL: No nausea, vomiting, diarrhea or abdominal pain.  GENITOURINARY: No dysuria, hematuria.  ENDOCRINE: No polyuria, nocturia,  HEMATOLOGY: No anemia, easy bruising or bleeding SKIN: No rash or lesion. MUSCULOSKELETAL: No joint pain or arthritis.   NEUROLOGIC: No tingling, numbness, weakness.  PSYCHIATRY: No anxiety or depression.   DRUG ALLERGIES:   Allergies  Allergen Reactions  . Iodinated Diagnostic Agents Shortness Of Breath    Only with nuclear stress test per pt Only with nuclear stress test per pt   . Penicillins Rash    Rash at injection site. Pt states she can take Amoxicillin & Keflex   . Escitalopram   . Gluten Meal   . Lithium   . Wheat Bran   . Erythromycin Nausea Only and Nausea And Vomiting    Other reaction(s): Other (See Comments) dehydration     VITALS:  Blood pressure (!) 192/88, pulse 73, temperature 98.1 F (36.7 C), temperature source Oral, resp. rate 15, height 5\' 1"  (1.549 m), weight 102.5 kg, SpO2 100 %.  PHYSICAL EXAMINATION:  GENERAL:  78 y.o.-year-old patient lying in the bed with no acute distress.  EYES: Pupils equal, round, reactive to light and accommodation. No scleral icterus. Extraocular muscles intact.  HEENT: Head atraumatic, normocephalic. Oropharynx and nasopharynx clear.  NECK:  Supple, no jugular venous distention. No thyroid enlargement, no tenderness.  LUNGS: Normal breath sounds  bilaterally, no wheezing, rales,rhonchi or crepitation. No use of accessory muscles of respiration.  CARDIOVASCULAR: S1, S2 normal. No murmurs, rubs, or gallops.  ABDOMEN: Soft, nontender, nondistended. Bowel sounds present.  EXTREMITIES: No pedal edema, cyanosis, or clubbing.  NEUROLOGIC: Awake, alert and oriented x3 sensation intact. Gait not checked.  PSYCHIATRIC: The patient is alert and oriented x 3.  SKIN: No obvious rash, lesion, or ulcer.    LABORATORY PANEL:   CBC Recent Labs  Lab 12/19/18 1002  WBC 8.8  HGB 9.9*  HCT 30.2*  PLT 299   ------------------------------------------------------------------------------------------------------------------  Chemistries  Recent Labs  Lab 12/19/18 0256  NA 140  K 4.2  CL 108  CO2 23  GLUCOSE 202*  BUN 30*  CREATININE 2.14*  CALCIUM 9.0   ------------------------------------------------------------------------------------------------------------------  Cardiac Enzymes Recent Labs  Lab 12/19/18 1002  TROPONINI 0.08*   ------------------------------------------------------------------------------------------------------------------  RADIOLOGY:  Dg Chest 2 View  Result Date: 12/19/2018 CLINICAL DATA:  Shortness of breath today. EXAM: CHEST - 2 VIEW COMPARISON:  PA and lateral chest 12/19/2015.  CT chest 10/23/2012. FINDINGS: There is cardiomegaly and mild pulmonary edema with associated small pleural effusions. No consolidative process or pneumothorax. No acute or focal bony abnormality. IMPRESSION: Cardiomegaly and mild edema with associated small effusions. Electronically Signed   By: Inge Rise M.D.   On: 12/19/2018 09:52   Ct Head Wo Contrast  Result Date: 12/18/2018 CLINICAL DATA:  78 year old female with altered mental status and acute lightheadedness. EXAM: CT HEAD WITHOUT CONTRAST TECHNIQUE: Contiguous axial images were obtained from the base of the skull through the  vertex without intravenous contrast.  COMPARISON:  12/20/2015 MR and 12/19/2015 CT FINDINGS: Brain: No evidence of acute infarction, hemorrhage, hydrocephalus, extra-axial collection or mass lesion/mass effect. Chronic small-vessel white matter ischemic changes again noted. Vascular: Carotid and vertebral atherosclerotic calcifications noted. Skull: Normal. Negative for fracture or focal lesion. Sinuses/Orbits: No acute finding. Other: None. IMPRESSION: 1. No evidence of acute intracranial abnormality 2. Chronic small-vessel white matter ischemic changes. Electronically Signed   By: Margarette Canada M.D.   On: 12/18/2018 15:50    EKG:   Orders placed or performed during the hospital encounter of 12/18/18  . ED EKG  . ED EKG  . EKG 12-Lead  . EKG 12-Lead  . EKG 12-Lead  . EKG 12-Lead  . EKG 12-Lead  . EKG 12-Lead  . EKG 12-Lead  . EKG 12-Lead    ASSESSMENT AND PLAN:    Hypertensive urgency-  likely related to patient being out of her benazepril. Also with elevated troponin and new T wave inversions in the inferolateral leads.  -Hydralazine and labetalol IV prn -Continue home benazepril, chlorthalidone,  titrate as needed -Propranolol dose increased  Elevated troponin- likely demand ischemia in the setting of uncontrolled hypertension. No active chest pain. -Continue heparin drip for next 24 hours per cardiology Dr. Saralyn Pilar -Trend troponins-0.11-0.09-0.08 -Continue home aspirin and plavix -Repeat EKG in the morning -No plans for cardiac investigations  Chronic diastolic CHF- recent ECHO with EF >55% and grade 3-4 diastolic dysfunction. Does not appear volume overloaded -Continue meds as above  Type 2 diabetes-had a low blood sugar to 65 this morning. -Continue SSI  CKD 4-creatinine at baseline -Avoid nephrotoxic agents -Recheck Cr in the morning -Outpatient follow-up with nephrology  Hyperlipidemia-stable -Continue home Lipitor  Depression/anxiety -Continue home Celexa    All the records are reviewed  and case discussed with Care Management/Social Workerr. Management plans discussed with the patient, she is  in agreement.  CODE STATUS: fc   TOTAL TIME TAKING CARE OF THIS PATIENT: 36 minutes.   POSSIBLE D/C IN 1-2  DAYS, DEPENDING ON CLINICAL CONDITION.  Note: This dictation was prepared with Dragon dictation along with smaller phrase technology. Any transcriptional errors that result from this process are unintentional.   Nicholes Mango M.D on 12/19/2018 at 11:33 AM  Between 7am to 6pm - Pager - [phone] After 6pm go to [email or web address] - password [email or web address] [facility]  Office  [phone]  CC: Primary care physician; Dion Body, MD

## 2018-12-19 NOTE — Progress Notes (Signed)
ANTICOAGULATION CONSULT NOTE - Initial Consult  Pharmacy Consult for Heparin  Indication: chest pain/ACS  Allergies  Allergen Reactions  . Iodinated Diagnostic Agents Shortness Of Breath    Only with nuclear stress test per pt Only with nuclear stress test per pt   . Penicillins Rash    Rash at injection site. Pt states she can take Amoxicillin & Keflex   . Escitalopram   . Lithium   . Erythromycin Nausea Only and Nausea And Vomiting    Other reaction(s): Other (See Comments) dehydration     Patient Measurements: Height: 5\' 1"  (154.9 cm) Weight: 226 lb (102.5 kg) IBW/kg (Calculated) : 47.8 Heparin Dosing Weight: 72.6 kg   Vital Signs: Temp: 97.6 F (36.4 C) (04/23 1804) Temp Source: Oral (04/23 1804) BP: 179/72 (04/23 1804) Pulse Rate: 62 (04/23 1804)  Labs: Recent Labs    12/18/18 1458 12/18/18 1654 12/18/18 2052 12/19/18 0116 12/19/18 0256 12/19/18 1002 12/19/18 1750  HGB 10.5*  --   --   --  10.5* 9.9*  --   HCT 31.7*  --   --   --  31.3* 30.2*  --   PLT 323  --   --   --  315 299  --   APTT  --  34  --   --   --   --   --   LABPROT  --  13.1  --   --   --   --   --   INR  --  1.0  --   --   --   --   --   HEPARINUNFRC  --   --   --  0.29*  --  0.45 0.39  CREATININE 2.13*  --   --   --  2.14*  --   --   TROPONINI 0.15*  --  0.11*  --  0.09* 0.08*  --     Estimated Creatinine Clearance: 23.5 mL/min (A) (by C-G formula based on SCr of 2.14 mg/dL (H)).   Medical History: Past Medical History:  Diagnosis Date  . Arthritis   . Depression    controlled  . Diabetes mellitus without complication ([place])    managed well;   Marland Kitchen GERD (gastroesophageal reflux disease)   . Hepatitis   . HLD (hyperlipidemia)   . Hypertension    somewhat controlled  . 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 Polymyalgia rheumatica ([place])   . Sleep apnea   . Stroke [facility])     Medications:  DAPT pta, no anticoagulation  Assessment: Pharmacy has been  consulted for heparin drip initiation and maintenance for this 78 yo F with ACS/STEMI.  4/23 @0116  HL 0.29 rebolus w/ heparin 1100 units IV x 1 and increase rate to 1150 units/hr 4/23 @1002  HL 0.45 continue current rate.   Goal of Therapy:  Heparin level 0.3-0.7 units/ml Monitor platelets by anticoagulation protocol: Yes   Plan:  HL therapeutic. Will continue current rate. Order aPTT in 8 hours. Hgb slight downward trend continue to monitor.  4/23:  HL @ 1750 = 0.39 Will continue pt on current rate and recheck HL on 4/24 with AM labs.    Calden Dorsey D 12/19/2018,7:02 PM

## 2018-12-19 NOTE — Care Management (Signed)
Low risk for readmission, no CM consult.  No discharge concerns identified by members of care team

## 2018-12-19 NOTE — Consult Note (Signed)
ANTICOAGULATION CONSULT NOTE - Initial Consult  Pharmacy Consult for Heparin Drip Indication: chest pain/ACS/STEMI  Allergies  Allergen Reactions  . Iodinated Diagnostic Agents Shortness Of Breath    Only with nuclear stress test per pt Only with nuclear stress test per pt   . Penicillins Rash    Rash at injection site. Pt states she can take Amoxicillin & Keflex   . Escitalopram   . Gluten Meal   . Lithium   . Wheat Bran   . Erythromycin Nausea Only and Nausea And Vomiting    Other reaction(s): Other (See Comments) dehydration     Patient Measurements: Height: 5\' 1"  (154.9 cm) Weight: 226 lb (102.5 kg) IBW/kg (Calculated) : 47.8 Heparin Dosing Weight: 72.6 kg  Vital Signs: Temp: 98.2 F (36.8 C) (04/23 0110) Temp Source: Oral (04/23 0110) BP: 186/105 (04/23 0110) Pulse Rate: 83 (04/23 0110)  Labs: Recent Labs    12/18/18 1458 12/18/18 1654 12/18/18 2052 12/19/18 0116  HGB 10.5*  --   --   --   HCT 31.7*  --   --   --   PLT 323  --   --   --   APTT  --  34  --   --   LABPROT  --  13.1  --   --   INR  --  1.0  --   --   HEPARINUNFRC  --   --   --  0.29*  CREATININE 2.13*  --   --   --   TROPONINI 0.15*  --  0.11*  --     Estimated Creatinine Clearance: 23.6 mL/min (A) (by C-G formula based on SCr of 2.13 mg/dL (H)).   Medical History: Past Medical History:  Diagnosis Date  . Arthritis   . Depression    controlled  . Diabetes mellitus without complication ([place])    managed well;   Marland Kitchen GERD (gastroesophageal reflux disease)   . Hepatitis   . HLD (hyperlipidemia)   . Hypertension    somewhat controlled  . 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 Polymyalgia rheumatica ([place])   . Sleep apnea   . Stroke [facility])     Medications:  DAPT pta, no anticoagulation  Assessment: Pharmacy has been consulted for heparin drip initiation and maintenance for this 78 yo F with ACS/STEMI.  Goal of Therapy:  Heparin level 0.3-0.7  units/ml Monitor platelets by anticoagulation protocol: Yes   Plan:  04/23 @ 0100 HL 0.29 subtherapeutic. Will rebolus w/ heparin 1100 units IV x 1 and increase rate to 1150 units/hr and will recheck HL @ 1000. CBC check w/ am labs. trops coming down 0.15 >> 0.11 will continue to monitor.  Tobie Lords, PharmD, BCPS Clinical Pharmacist 12/19/2018 1:59 AM

## 2018-12-20 LAB — GLUCOSE, CAPILLARY
Glucose-Capillary: 169 mg/dL — ABNORMAL HIGH (ref 70–99)
Glucose-Capillary: 153 mg/dL — ABNORMAL HIGH (ref 70–99)

## 2018-12-20 LAB — CBC
HCT: 27.2 % — ABNORMAL LOW (ref 36.0–46.0)
Hemoglobin: 8.8 g/dL — ABNORMAL LOW (ref 12.0–15.0)
MCH: 29.5 pg (ref 26.0–34.0)
MCHC: 32.4 g/dL (ref 30.0–36.0)
MCV: 91.3 fL (ref 80.0–100.0)
Platelets: 261 10*3/uL (ref 150–400)
RBC: 2.98 MIL/uL — ABNORMAL LOW (ref 3.87–5.11)
RDW: 13.6 % (ref 11.5–15.5)
WBC: 7.1 10*3/uL (ref 4.0–10.5)
nRBC: 0 % (ref 0.0–0.2)

## 2018-12-20 LAB — HEPARIN LEVEL (UNFRACTIONATED): Heparin Unfractionated: 0.39 IU/mL (ref 0.30–0.70)

## 2018-12-20 MED ORDER — SODIUM CHLORIDE 0.9% FLUSH: 3.0000 mL | INTRAVENOUS | Status: DC | PRN

## 2018-12-20 MED ORDER — SODIUM CHLORIDE 0.9% FLUSH
3.0000 mL | Freq: Two times a day (BID) | INTRAVENOUS | Status: DC
  Administered 2018-12-20: 3 mL via INTRAVENOUS

## 2018-12-20 MED ORDER — ENOXAPARIN SODIUM 30 MG/0.3ML ~~LOC~~ SOLN: 30.0000 mg | Freq: Once | SUBCUTANEOUS | Status: DC

## 2018-12-20 MED ORDER — HYDRALAZINE HCL 50 MG PO TABS: 50.0000 mg | ORAL_TABLET | Freq: Three times a day (TID) | ORAL | 0 refills | Status: DC

## 2018-12-20 MED ORDER — HYDRALAZINE HCL 50 MG PO TABS
50.0000 mg | ORAL_TABLET | Freq: Two times a day (BID) | ORAL | Status: DC
  Administered 2018-12-20: 50 mg via ORAL
  Filled 2018-12-20: qty 1

## 2018-12-20 MED ORDER — HYDRALAZINE HCL 50 MG PO TABS: 50.0000 mg | ORAL_TABLET | Freq: Three times a day (TID) | ORAL | Status: DC

## 2018-12-20 NOTE — Discharge Instructions (Signed)
°  Follow-up with primary care physician in 3 days.  Please repeat CBC and if hemoglobin is low need further investigations Follow-up with Dr. Ubaldo Glassing in a week Follow-up with Dr. Ashby Dawes pulmonology in 10 days regarding sleep apnea Follow-up with primary nephrologist for chronic kidney disease stage IV

## 2018-12-20 NOTE — Progress Notes (Signed)
ANTICOAGULATION CONSULT NOTE - Initial Consult  Pharmacy Consult for Heparin  Indication: chest pain/ACS  Allergies  Allergen Reactions  . Iodinated Diagnostic Agents Shortness Of Breath    Only with nuclear stress test per pt Only with nuclear stress test per pt   . Penicillins Rash    Rash at injection site. Pt states she can take Amoxicillin & Keflex   . Escitalopram   . Lithium   . Erythromycin Nausea Only and Nausea And Vomiting    Other reaction(s): Other (See Comments) dehydration     Patient Measurements: Height: 5\' 1"  (154.9 cm) Weight: 211 lb 9.6 oz (96 kg) IBW/kg (Calculated) : 47.8 Heparin Dosing Weight: 72.6 kg   Vital Signs: Temp: 97.5 F (36.4 C) (04/24 0321) Temp Source: Oral (04/24 0321) BP: 157/61 (04/24 0321) Pulse Rate: 61 (04/24 0321)  Labs: Recent Labs    12/18/18 1458 12/18/18 1654 12/18/18 2052  12/19/18 0256 12/19/18 1002 12/19/18 1750 12/20/18 0404  HGB 10.5*  --   --   --  10.5* 9.9*  --  8.8*  HCT 31.7*  --   --   --  31.3* 30.2*  --  27.2*  PLT 323  --   --   --  315 299  --  261  APTT  --  34  --   --   --   --   --   --   LABPROT  --  13.1  --   --   --   --   --   --   INR  --  1.0  --   --   --   --   --   --   HEPARINUNFRC  --   --   --    < >  --  0.45 0.39 0.39  CREATININE 2.13*  --   --   --  2.14*  --   --   --   TROPONINI 0.15*  --  0.11*  --  0.09* 0.08*  --   --    < > = values in this interval not displayed.    Estimated Creatinine Clearance: 22.6 mL/min (A) (by C-G formula based on SCr of 2.14 mg/dL (H)).   Medical History: Past Medical History:  Diagnosis Date  . Arthritis   . Depression    controlled  . Diabetes mellitus without complication ([place])    managed well;   Marland Kitchen GERD (gastroesophageal reflux disease)   . Hepatitis   . HLD (hyperlipidemia)   . Hypertension    somewhat controlled  . 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 Polymyalgia rheumatica ([place])   . Sleep apnea   .  Stroke [facility])     Medications:  DAPT pta, no anticoagulation  Assessment: Pharmacy has been consulted for heparin drip initiation and maintenance for this 78 yo F with ACS/STEMI.  4/23 @0116  HL 0.29 rebolus w/ heparin 1100 units IV x 1 and increase rate to 1150 units/hr 4/23 @1002  HL 0.45 continue current rate.   Goal of Therapy:  Heparin level 0.3-0.7 units/ml Monitor platelets by anticoagulation protocol: Yes   Plan:  04/24 @ 0500 HL 0.39 therapeutic. Will continue current and will recheck HL w/ am labs. H/h & plts trending down steadily will continue to monitor.  Tobie Lords, PharmD, BCPS Clinical Pharmacist 12/20/2018

## 2018-12-20 NOTE — Progress Notes (Signed)
[facility]  SUBJECTIVE: The patient reports feeling much better today as she was able to sleep well last night. She denies chest pain or shortness of breath. Her weakness is much improved.   Vitals:   12/20/18 0300 12/20/18 0321 12/20/18 0806 12/20/18 0828  BP:  (!) 157/61 (!) 179/50   Pulse:  61 61   Resp:  18  20  Temp:  (!) 97.5 F (36.4 C) 98.1 F (36.7 C)   TempSrc:  Oral Oral   SpO2:  97% 94%   Weight: 96 kg     Height:         Intake/Output Summary (Last 24 hours) at 12/20/2018 0855 Last data filed at 12/20/2018 7494 Gross per 24 hour  Intake 839.32 ml  Output 600 ml  Net 239.32 ml      PHYSICAL EXAM  General: Well developed, well nourished, in no acute distress, sitting up in bed eating breakfast HEENT:  Normocephalic and atramatic Neck:  No JVD.  Lungs: normal effort of breathing on room air, mildly reduced bibasilar breath sounds Heart: HRRR . 2/6 mid systolic murmur Abdomen: nondistended Msk:  Gait not assessed. No obvious deformity. Normal strength and tone for age. Extremities: trace bilateral pedal edema Neuro: Alert and oriented X 3. Psych:  Good affect, responds appropriately   LABS: Basic Metabolic Panel: Recent Labs    12/18/18 1458 12/19/18 0256  NA 141 140  K 4.3 4.2  CL 107 108  CO2 27 23  GLUCOSE 92 202*  BUN 27* 30*  CREATININE 2.13* 2.14*  CALCIUM 9.4 9.0   Liver Function Tests: No results for input(s): AST, ALT, ALKPHOS, BILITOT, PROT, ALBUMIN in the last 72 hours. No results for input(s): LIPASE, AMYLASE in the last 72 hours. CBC: Recent Labs    12/19/18 1002 12/20/18 0404  WBC 8.8 7.1  HGB 9.9* 8.8*  HCT 30.2* 27.2*  MCV 90.4 91.3  PLT 299 261   Cardiac Enzymes: Recent Labs    12/18/18 2052 12/19/18 0256 12/19/18 1002  TROPONINI 0.11* 0.09* 0.08*   BNP: Invalid input(s): POCBNP D-Dimer: No results for input(s): DDIMER in the last 72 hours. Hemoglobin A1C: No results for input(s): HGBA1C in the last 72  hours. Fasting Lipid Panel: No results for input(s): CHOL, HDL, LDLCALC, TRIG, CHOLHDL, LDLDIRECT in the last 72 hours. Thyroid Function Tests: No results for input(s): TSH, T4TOTAL, T3FREE, THYROIDAB in the last 72 hours.  Invalid input(s): FREET3 Anemia Panel: No results for input(s): VITAMINB12, FOLATE, FERRITIN, TIBC, IRON, RETICCTPCT in the last 72 hours.  Dg Chest 2 View  Result Date: 12/19/2018 CLINICAL DATA:  Shortness of breath today. EXAM: CHEST - 2 VIEW COMPARISON:  PA and lateral chest 12/19/2015.  CT chest 10/23/2012. FINDINGS: There is cardiomegaly and mild pulmonary edema with associated small pleural effusions. No consolidative process or pneumothorax. No acute or focal bony abnormality. IMPRESSION: Cardiomegaly and mild edema with associated small effusions. Electronically Signed   By: Inge Rise M.D.   On: 12/19/2018 09:52   Ct Head Wo Contrast  Result Date: 12/18/2018 CLINICAL DATA:  78 year old female with altered mental status and acute lightheadedness. EXAM: CT HEAD WITHOUT CONTRAST TECHNIQUE: Contiguous axial images were obtained from the base of the skull through the vertex without intravenous contrast. COMPARISON:  12/20/2015 MR and 12/19/2015 CT FINDINGS: Brain: No evidence of acute infarction, hemorrhage, hydrocephalus, extra-axial collection or mass lesion/mass effect. Chronic small-vessel white matter ischemic changes again noted. Vascular: Carotid and vertebral atherosclerotic calcifications noted. Skull: Normal.  Negative for fracture or focal lesion. Sinuses/Orbits: No acute finding. Other: None. IMPRESSION: 1. No evidence of acute intracranial abnormality 2. Chronic small-vessel white matter ischemic changes. Electronically Signed   By: Margarette Canada M.D.   On: 12/18/2018 15:50    TELEMETRY: sinus rhythm  ASSESSMENT AND PLAN:  Active Problems:   Hypertensive urgency    1. Borderline elevated troponin in the absence of chest pain with ECG changes, though  abnormal at baseline, in the setting of hypertensive urgency. Troponin trended down from 0.15 to 0.08. Patient started on heparin drip, which continued for approximately 40 hours. 2. Hypertensive urgency, likely due to not taking benazepril for 2 weeks. Still elevated. 3. Symptomatic hypoglycemia 4. Type II diabetes 5. CKD stage 4 6. Anemia with hemoglobin 8.8 this morning. Hemoglobin was 11.1 on 11/27/2018. Patient denies active bleeding; has not had bowel movement this admission.  Plan: 1. Discontinue heparin in light of anemia and absence of chest pain 2. Continue home benazepril, chlorthalidone, propanolol. Consider reducing dose of propanolol due to bradycardia. 3. Resume home hydralazine 50 mg BID 4. Recommend evaluation of anemia 5. No further cardiac diagnostics recommended at this time 6. Patient to follow-up with Dr. Ubaldo Glassing in 1 week   Clabe Seal, PA-C 12/20/2018 8:55 AM   Sign off for now; call with questions.

## 2018-12-20 NOTE — Discharge Summary (Signed)
[facility] NAME: Holly Conner    MR#:  [id number]  DATE OF BIRTH:  1941-05-12  DATE OF ADMISSION:  12/18/2018 ADMITTING PHYSICIAN: Sela Hua, MD  DATE OF DISCHARGE:  12/20/18   PRIMARY CARE PHYSICIAN: Dion Body, MD    ADMISSION DIAGNOSIS:  NSTEMI (non-ST elevated myocardial infarction) ([place]) [I21.4] Hypertensive urgency [I16.0] Generalized weakness [R53.1]  DISCHARGE DIAGNOSIS:  Active Problems:   Hypertensive urgency   SECONDARY DIAGNOSIS:   Past Medical History:  Diagnosis Date  . Arthritis   . Depression    controlled  . Diabetes mellitus without complication ([place])    managed well;   Marland Kitchen GERD (gastroesophageal reflux disease)   . Hepatitis   . HLD (hyperlipidemia)   . Hypertension    somewhat controlled  . 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 Polymyalgia rheumatica ([place])   . Sleep apnea   . Stroke [facility])     HOSPITAL COURSE:    Hypertensive urgency- likely related to patient being out of her benazepril. Also with elevated troponin and new T wave inversions in the inferolateral leads. -Better -Hydralazine and labetalol IV prn -Continue home benazepril,chlorthalidone, titrate as needed -Propranolol  be continued at home dose -Hydralazine dose increased to 50 mg p.o. every 8 hours.  Titrate medications as needed  -Anemia-could be from heparin drip which is discontinued Hemoglobin 8.8, normocytic with MCV 90 Stool for occult blood ordered Patient prefers going home.  PCP to repeat hemoglobin during the follow-up visit in 3 days and if necessary refer to GI/oncology  Elevated troponin in the absence of chest pain positive EKG changes with abnormal baseline-likely demand ischemia in the setting of uncontrolled hypertension. No active chest pain. -Patient has received heparin drip for  24 hours per cardiology Dr. Saralyn Pilar which is discontinued now -Trend  troponins-0.11-0.09-0.08 -Continue home aspirinand plavix -Repeat EKG in the morning -No plans for cardiac investigations -Follow-up with Dr. Ubaldo Glassing in 1 week  Chronic diastolic CHF- recent ECHO with EF >55% and grade 3-4 diastolic dysfunction. Does not appear volume overloaded -Continue meds as above  Type 2 diabetes-had a low blood sugar to 65 this morning. -ContinueSSI  CKD 4-creatinine at baseline -Avoid nephrotoxic agents -Recheck Cr in the morning -Outpatient follow-up with nephrology  Hyperlipidemia-stable -Continue home Lipitor  Depression/anxiety -Continue home Celexa  -History of obstructive sleep apnea-could not tolerate CPAP mask Prefers seeing local pulmonologist will refer to [facility] pulmonology  DISCHARGE CONDITIONS:   Fair  CONSULTS OBTAINED:  Treatment Team:  Isaias Cowman, MD   PROCEDURES none  DRUG ALLERGIES:   Allergies  Allergen Reactions  . Iodinated Diagnostic Agents Shortness Of Breath    Only with nuclear stress test per pt Only with nuclear stress test per pt   . Penicillins Rash    Rash at injection site. Pt states she can take Amoxicillin & Keflex   . Escitalopram   . Lithium   . Erythromycin Nausea Only and Nausea And Vomiting    Other reaction(s): Other (See Comments) dehydration     DISCHARGE MEDICATIONS:   Allergies as of 12/20/2018      Reactions   Iodinated Diagnostic Agents Shortness Of Breath   Only with nuclear stress test per pt Only with nuclear stress test per pt   Penicillins Rash   Rash at injection site. Pt states she can take Amoxicillin & Keflex   Escitalopram    Lithium  Erythromycin Nausea Only, Nausea And Vomiting   Other reaction(s): Other (See Comments) dehydration      Medication List    TAKE these medications   aspirin EC 81 MG tablet Take by mouth.   atorvastatin 40 MG tablet Commonly known as:  LIPITOR Take 40 mg by mouth daily. Notes to patient:  TAKE WITH DINNER    benazepril 40 MG tablet Commonly known as:  LOTENSIN Take 40 mg by mouth 2 (two) times daily.   chlorthalidone 25 MG tablet Commonly known as:  HYGROTON chlorthalidone 25 mg tablet   citalopram 20 MG tablet Commonly known as:  CELEXA Take 40 mg by mouth daily.   clopidogrel 75 MG tablet Commonly known as:  Plavix Take 1 tablet (75 mg total) by mouth daily.   Fifty50 Glucose Meter 2.0 w/Device Kit Use as instructed.   Fifty50 Pen Needles 31G X 8 MM Misc Generic drug:  Insulin Pen Needle Use 1 needle to skin four times daily as directed with insulin pens   fluticasone 50 MCG/ACT nasal spray Commonly known as:  FLONASE Place 2 sprays into both nostrils at bedtime.   FreeStyle Libre 14 Day Sensor Misc USE AS DIRECTED EVERY 14 DAYS   hydrALAZINE 50 MG tablet Commonly known as:  APRESOLINE Take 1 tablet (50 mg total) by mouth 3 (three) times daily.   loratadine 10 MG tablet Commonly known as:  CLARITIN Take 10 mg by mouth daily as needed for allergies.   NovoLOG FlexPen 100 UNIT/ML FlexPen Generic drug:  insulin aspart Novolog Flexpen U-100 Insulin aspart 100 unit/mL (3 mL) subcutaneous   propranolol 20 MG tablet Commonly known as:  INDERAL Take 20 mg by mouth 2 (two) times daily.   pyridOXINE 100 MG tablet Commonly known as:  VITAMIN B-6 Take 100 mg by mouth daily. Notes to patient:  NONE GIVEN TODAY   simvastatin 80 MG tablet Commonly known as:  ZOCOR simvastatin 80 mg tablet Notes to patient:  NONE GIVEN TODAY   solifenacin 10 MG tablet Commonly known as:  VESICARE Take 1 tablet (10 mg total) by mouth daily. Notes to patient:  NONE GIVEN TODAY   traZODone 100 MG tablet Commonly known as:  DESYREL Take 100 mg by mouth at bedtime.   Tyler Aas FlexTouch 200 UNIT/ML Sopn Generic drug:  Insulin Degludec Tresiba FlexTouch U-200 insulin 200 unit/mL (3 mL) subcutaneous pen   Victoza 18 MG/3ML Sopn Generic drug:  liraglutide Victoza 3-Pak 0.6 mg/0.1 mL (18  mg/3 mL) subcutaneous pen injector   VITAMIN B-12 PO Take by mouth. Notes to patient:  NONE GIVEN TODAY   vitamin C 1000 MG tablet Take 1,000 mg by mouth daily. Notes to patient:  NONE GIVEN TODAY        DISCHARGE INSTRUCTIONS:    Follow-up with primary care physician in 3 days.  Please repeat CBC and if hemoglobin is low need further investigations Follow-up with Dr. Ubaldo Glassing in a week Follow-up with Dr. Ashby Dawes pulmonology in 10 days regarding sleep apnea Follow-up with primary nephrologist for chronic kidney disease stage IV DIET:  Cardiac diet and Diabetic diet  DISCHARGE CONDITION:  Stable  ACTIVITY:  Activity as tolerated  OXYGEN:  Home Oxygen: No.   Oxygen Delivery: room air  DISCHARGE LOCATION:  home   If you experience worsening of your admission symptoms, develop shortness of breath, life threatening emergency, suicidal or homicidal thoughts you must seek medical attention immediately by calling 911 or calling your MD immediately  if symptoms less severe.  You Must read complete instructions/literature along with all the possible adverse reactions/side effects for all the Medicines you take and that have been prescribed to you. Take any new Medicines after you have completely understood and accpet all the possible adverse reactions/side effects.   Please note  You were cared for by a hospitalist during your hospital stay. If you have any questions about your discharge medications or the care you received while you were in the hospital after you are discharged, you can call the unit and asked to speak with the hospitalist on call if the hospitalist that took care of you is not available. Once you are discharged, your primary care physician will handle any further medical issues. Please note that NO REFILLS for any discharge medications will be authorized once you are discharged, as it is imperative that you return to your primary care physician (or establish a  relationship with a primary care physician if you do not have one) for your aftercare needs so that they can reassess your need for medications and monitor your lab values.     Today  Chief Complaint  Patient presents with  . Hypoglycemia   Patient is doing fine.  Denies any blood in her stool or black tarry stool.  Heparin drip discontinued hemoglobin at 8.8.  Stool for occult blood ordered but patient prefers going home.  She just had a bowel movement and flushed her stool.  Denies any nausea vomiting abdominal pain or dizziness Okay to discharge patient from cardiology standpoint  ROS:  CONSTITUTIONAL: Denies fevers, chills. Denies any fatigue, weakness.  EYES: Denies blurry vision, double vision, eye pain. EARS, NOSE, THROAT: Denies tinnitus, ear pain, hearing loss. RESPIRATORY: Denies cough, wheeze, shortness of breath.  CARDIOVASCULAR: Denies chest pain, palpitations, edema.  GASTROINTESTINAL: Denies nausea, vomiting, diarrhea, abdominal pain. Denies bright red blood per rectum. GENITOURINARY: Denies dysuria, hematuria. ENDOCRINE: Denies nocturia or thyroid problems. HEMATOLOGIC AND LYMPHATIC: Denies easy bruising or bleeding. SKIN: Denies rash or lesion. MUSCULOSKELETAL: Denies pain in neck, back, shoulder, knees, hips or arthritic symptoms.  NEUROLOGIC: Denies paralysis, paresthesias.  PSYCHIATRIC: Denies anxiety or depressive symptoms.   VITAL SIGNS:  Blood pressure (!) 154/62, pulse (!) 59, temperature 98.1 F (36.7 C), temperature source Oral, resp. rate 20, height 5' 1" (1.549 m), weight 96 kg, SpO2 94 %.  I/O:    Intake/Output Summary (Last 24 hours) at 12/20/2018 1343 Last data filed at 12/20/2018 0322 Gross per 24 hour  Intake 599.32 ml  Output 400 ml  Net 199.32 ml    PHYSICAL EXAMINATION:  GENERAL:  78 y.o.-year-old patient lying in the bed with no acute distress.  EYES: Pupils equal, round, reactive to light and accommodation. No scleral icterus.  Extraocular muscles intact.  HEENT: Head atraumatic, normocephalic. Oropharynx and nasopharynx clear.  NECK:  Supple, no jugular venous distention. No thyroid enlargement, no tenderness.  LUNGS: Normal breath sounds bilaterally, no wheezing, rales,rhonchi or crepitation. No use of accessory muscles of respiration.  CARDIOVASCULAR: S1, S2 normal. No murmurs, rubs, or gallops.  ABDOMEN: Soft, non-tender, non-distended. Bowel sounds present.  EXTREMITIES: No pedal edema, cyanosis, or clubbing.  NEUROLOGIC: Awake, alert and oriented x3 sensation intact. Gait not checked.  PSYCHIATRIC: The patient is alert and oriented x 3.  SKIN: No obvious rash, lesion, or ulcer.   DATA REVIEW:   CBC Recent Labs  Lab 12/20/18 0404  WBC 7.1  HGB 8.8*  HCT 27.2*  PLT 261    Chemistries  Recent Labs  Lab  12/19/18 0256  NA 140  K 4.2  CL 108  CO2 23  GLUCOSE 202*  BUN 30*  CREATININE 2.14*  CALCIUM 9.0    Cardiac Enzymes Recent Labs  Lab 12/19/18 1002  TROPONINI 0.08*    Microbiology Results  Results for orders placed or performed in visit on 07/16/18  Microscopic Examination     Status: Abnormal   Collection Time: 07/16/18  3:32 PM  Result Value Ref Range Status   WBC, UA 0-5 0 - 5 /hpf Final   RBC, UA None seen 0 - 2 /hpf Final   Epithelial Cells (non renal) >10 (H) 0 - 10 /hpf Final   Renal Epithel, UA 0-10 (A) None seen /hpf Final   Casts Present (A) None seen /lpf Final   Cast Type Hyaline casts N/A Final   Mucus, UA Present (A) Not Estab. Final   Bacteria, UA Many (A) None seen/Few Final    RADIOLOGY:  Dg Chest 2 View  Result Date: 12/19/2018 CLINICAL DATA:  Shortness of breath today. EXAM: CHEST - 2 VIEW COMPARISON:  PA and lateral chest 12/19/2015.  CT chest 10/23/2012. FINDINGS: There is cardiomegaly and mild pulmonary edema with associated small pleural effusions. No consolidative process or pneumothorax. No acute or focal bony abnormality. IMPRESSION: Cardiomegaly and  mild edema with associated small effusions. Electronically Signed   By: Inge Rise M.D.   On: 12/19/2018 09:52   Ct Head Wo Contrast  Result Date: 12/18/2018 CLINICAL DATA:  78 year old female with altered mental status and acute lightheadedness. EXAM: CT HEAD WITHOUT CONTRAST TECHNIQUE: Contiguous axial images were obtained from the base of the skull through the vertex without intravenous contrast. COMPARISON:  12/20/2015 MR and 12/19/2015 CT FINDINGS: Brain: No evidence of acute infarction, hemorrhage, hydrocephalus, extra-axial collection or mass lesion/mass effect. Chronic small-vessel white matter ischemic changes again noted. Vascular: Carotid and vertebral atherosclerotic calcifications noted. Skull: Normal. Negative for fracture or focal lesion. Sinuses/Orbits: No acute finding. Other: None. IMPRESSION: 1. No evidence of acute intracranial abnormality 2. Chronic small-vessel white matter ischemic changes. Electronically Signed   By: Margarette Canada M.D.   On: 12/18/2018 15:50    EKG:   Orders placed or performed during the hospital encounter of 12/18/18  . ED EKG  . ED EKG  . EKG 12-Lead  . EKG 12-Lead  . EKG 12-Lead  . EKG 12-Lead  . EKG 12-Lead  . EKG 12-Lead  . EKG 12-Lead  . EKG 12-Lead      Management plans discussed with the patient, She is in agreement.  CODE STATUS:     Code Status Orders  (From admission, onward)         Start     Ordered   12/18/18 2040  Full code  Continuous     12/18/18 2039        Code Status History    Date Active Date Inactive Code Status Order ID Comments User Context   12/19/2015 2325 12/20/2015 1749 Full Code [id number]  Lance Coon, MD Inpatient    Advance Directive Documentation     Most Recent Value  Type of Advance Directive  Healthcare Power of [place], Living will  Pre-existing out of facility DNR order (yellow form or pink MOST form)  -  "MOST" Form in Place?  -      TOTAL TIME TAKING CARE OF THIS PATIENT: 43  minutes.   Note: This dictation was prepared with Dragon dictation along with smaller phrase technology. Any transcriptional errors  that result from this process are unintentional.   _0 @  on 12/20/2018 at 1:43 PM  Between 7am to 6pm - Pager - [phone]  After 6pm go to [email or web address] - password [email or web address] [place] [facility]  Office  [phone]  CC: Primary care physician; Dion Body, MD

## 2018-12-20 NOTE — Care Management (Signed)
Spoke with patient regarding transportation home.  There are no transportation options covered by insurance that CM is aware of.  The patient may have an option provided in her [place] and she is advised to check and get set up with that after discharge.  The patient states she has a friend that can pick her up around 2:00 today and does not need further assistance.

## 2019-03-03 ENCOUNTER — Other Ambulatory Visit: Payer: Self-pay | Admitting: Family Medicine

## 2019-03-03 DIAGNOSIS — Z1231 Encounter for screening mammogram for malignant neoplasm of breast: Secondary | ICD-10-CM

## 2019-03-27 ENCOUNTER — Emergency Department: Payer: Medicare Other

## 2019-03-27 ENCOUNTER — Inpatient Hospital Stay
Admission: EM | Admit: 2019-03-27 | Discharge: 2019-04-01 | DRG: 065 | Disposition: A | Discharge status: Home Health | Payer: Medicare Other | Attending: Internal Medicine | Admitting: Internal Medicine

## 2019-03-27 ENCOUNTER — Other Ambulatory Visit: Payer: Self-pay

## 2019-03-27 DIAGNOSIS — R531 Weakness: Secondary | ICD-10-CM | POA: Diagnosis present

## 2019-03-27 DIAGNOSIS — I129 Hypertensive chronic kidney disease with stage 1 through stage 4 chronic kidney disease, or unspecified chronic kidney disease: Secondary | ICD-10-CM | POA: Diagnosis present

## 2019-03-27 DIAGNOSIS — Z96651 Presence of right artificial knee joint: Secondary | ICD-10-CM | POA: Diagnosis present

## 2019-03-27 DIAGNOSIS — Z880 Allergy status to penicillin: Secondary | ICD-10-CM

## 2019-03-27 DIAGNOSIS — R29703 NIHSS score 3: Secondary | ICD-10-CM | POA: Diagnosis present

## 2019-03-27 DIAGNOSIS — I6340 Cerebral infarction due to embolism of unspecified cerebral artery: Principal | ICD-10-CM | POA: Diagnosis present

## 2019-03-27 DIAGNOSIS — E860 Dehydration: Secondary | ICD-10-CM | POA: Diagnosis present

## 2019-03-27 DIAGNOSIS — Z91041 Radiographic dye allergy status: Secondary | ICD-10-CM

## 2019-03-27 DIAGNOSIS — N179 Acute kidney failure, unspecified: Secondary | ICD-10-CM | POA: Diagnosis present

## 2019-03-27 DIAGNOSIS — E785 Hyperlipidemia, unspecified: Secondary | ICD-10-CM | POA: Diagnosis present

## 2019-03-27 DIAGNOSIS — F05 Delirium due to known physiological condition: Secondary | ICD-10-CM | POA: Diagnosis not present

## 2019-03-27 DIAGNOSIS — Z8051 Family history of malignant neoplasm of kidney: Secondary | ICD-10-CM

## 2019-03-27 DIAGNOSIS — R269 Unspecified abnormalities of gait and mobility: Secondary | ICD-10-CM | POA: Diagnosis present

## 2019-03-27 DIAGNOSIS — E1122 Type 2 diabetes mellitus with diabetic chronic kidney disease: Secondary | ICD-10-CM | POA: Diagnosis present

## 2019-03-27 DIAGNOSIS — Z8249 Family history of ischemic heart disease and other diseases of the circulatory system: Secondary | ICD-10-CM

## 2019-03-27 DIAGNOSIS — I160 Hypertensive urgency: Secondary | ICD-10-CM | POA: Diagnosis present

## 2019-03-27 DIAGNOSIS — I700 Atherosclerosis of aorta: Secondary | ICD-10-CM | POA: Diagnosis present

## 2019-03-27 DIAGNOSIS — Z794 Long term (current) use of insulin: Secondary | ICD-10-CM

## 2019-03-27 DIAGNOSIS — T4145XA Adverse effect of unspecified anesthetic, initial encounter: Secondary | ICD-10-CM | POA: Diagnosis not present

## 2019-03-27 DIAGNOSIS — R4182 Altered mental status, unspecified: Secondary | ICD-10-CM | POA: Diagnosis not present

## 2019-03-27 DIAGNOSIS — R4701 Aphasia: Secondary | ICD-10-CM | POA: Diagnosis present

## 2019-03-27 DIAGNOSIS — R7989 Other specified abnormal findings of blood chemistry: Secondary | ICD-10-CM | POA: Diagnosis present

## 2019-03-27 DIAGNOSIS — N184 Chronic kidney disease, stage 4 (severe): Secondary | ICD-10-CM | POA: Diagnosis present

## 2019-03-27 DIAGNOSIS — Z20828 Contact with and (suspected) exposure to other viral communicable diseases: Secondary | ICD-10-CM | POA: Diagnosis present

## 2019-03-27 DIAGNOSIS — E1165 Type 2 diabetes mellitus with hyperglycemia: Secondary | ICD-10-CM | POA: Diagnosis present

## 2019-03-27 DIAGNOSIS — Z8673 Personal history of transient ischemic attack (TIA), and cerebral infarction without residual deficits: Secondary | ICD-10-CM

## 2019-03-27 LAB — COMPREHENSIVE METABOLIC PANEL
ALT: 14 U/L (ref 0–44)
AST: 17 U/L (ref 15–41)
Albumin: 3.7 g/dL (ref 3.5–5.0)
Alkaline Phosphatase: 79 U/L (ref 38–126)
Anion gap: 12 (ref 5–15)
BUN: 40 mg/dL — ABNORMAL HIGH (ref 8–23)
CO2: 24 mmol/L (ref 22–32)
Calcium: 9.3 mg/dL (ref 8.9–10.3)
Chloride: 98 mmol/L (ref 98–111)
Creatinine, Ser: 2.7 mg/dL — ABNORMAL HIGH (ref 0.44–1.00)
GFR calc Af Amer: 19 mL/min — ABNORMAL LOW (ref >60)
GFR calc non Af Amer: 16 mL/min — ABNORMAL LOW (ref >60)
Glucose, Bld: 371 mg/dL — ABNORMAL HIGH (ref 70–99)
Potassium: 4.1 mmol/L (ref 3.5–5.1)
Sodium: 134 mmol/L — ABNORMAL LOW (ref 135–145)
Total Bilirubin: 1.1 mg/dL (ref 0.3–1.2)
Total Protein: 7 g/dL (ref 6.5–8.1)

## 2019-03-27 LAB — CBC
HCT: 37.7 % (ref 36.0–46.0)
Hemoglobin: 13.2 g/dL (ref 12.0–15.0)
MCH: 29.4 pg (ref 26.0–34.0)
MCHC: 35 g/dL (ref 30.0–36.0)
MCV: 84 fL (ref 80.0–100.0)
Platelets: 390 10*3/uL (ref 150–400)
RBC: 4.49 MIL/uL (ref 3.87–5.11)
RDW: 12.7 % (ref 11.5–15.5)
WBC: 10.1 10*3/uL (ref 4.0–10.5)
nRBC: 0 % (ref 0.0–0.2)

## 2019-03-27 LAB — URINALYSIS, COMPLETE (UACMP) WITH MICROSCOPIC
Bacteria, UA: NONE SEEN
Bilirubin Urine: NEGATIVE
Glucose, UA: >=500 mg/dL — AB
Hgb urine dipstick: NEGATIVE
Ketones, ur: 5 mg/dL — AB
Leukocytes,Ua: NEGATIVE
Nitrite: NEGATIVE
Protein, ur: 100 mg/dL — AB
Specific Gravity, Urine: 1.01 (ref 1.005–1.030)
pH: 7 (ref 5.0–8.0)

## 2019-03-27 LAB — TROPONIN I (HIGH SENSITIVITY)
Troponin I (High Sensitivity): 71 ng/L — ABNORMAL HIGH (ref <18)
Troponin I (High Sensitivity): 82 ng/L — ABNORMAL HIGH (ref <18)

## 2019-03-27 LAB — GLUCOSE, CAPILLARY
Glucose-Capillary: 338 mg/dL — ABNORMAL HIGH (ref 70–99)
Glucose-Capillary: 250 mg/dL — ABNORMAL HIGH (ref 70–99)

## 2019-03-27 LAB — SARS CORONAVIRUS 2 BY RT PCR (HOSPITAL ORDER, PERFORMED IN ~~LOC~~ HOSPITAL LAB): SARS Coronavirus 2: NEGATIVE

## 2019-03-27 MED ORDER — HYDRALAZINE HCL 50 MG PO TABS
50.0000 mg | ORAL_TABLET | Freq: Once | ORAL | Status: AC
  Administered 2019-03-27: 50 mg via ORAL
  Filled 2019-03-27: qty 1

## 2019-03-27 MED ORDER — CHLORTHALIDONE 25 MG PO TABS
25.0000 mg | ORAL_TABLET | Freq: Every day | ORAL | Status: DC
  Administered 2019-03-28: 25 mg via ORAL
  Filled 2019-03-27 (×3): qty 1

## 2019-03-27 MED ORDER — HYDRALAZINE HCL 20 MG/ML IJ SOLN
5.0000 mg | Freq: Once | INTRAMUSCULAR | Status: AC
  Administered 2019-03-27: 5 mg via INTRAVENOUS
  Filled 2019-03-27: qty 1

## 2019-03-27 MED ORDER — INSULIN ASPART 100 UNIT/ML ~~LOC~~ SOLN
8.0000 [IU] | Freq: Once | SUBCUTANEOUS | Status: AC
  Administered 2019-03-27: 8 [IU] via INTRAVENOUS
  Filled 2019-03-27: qty 1

## 2019-03-27 MED ORDER — BENAZEPRIL HCL 20 MG PO TABS
40.0000 mg | ORAL_TABLET | Freq: Every day | ORAL | Status: DC
  Administered 2019-03-28: 40 mg via ORAL
  Filled 2019-03-27 (×3): qty 2

## 2019-03-27 NOTE — ED Notes (Signed)
Patient transported to CT 

## 2019-03-27 NOTE — ED Triage Notes (Signed)
pt arrives via ems from home. ems reports c/o weakness.  ems initally called out to help get out of chair. ems reports pt room mate has not been eat or drinking for several days, and not taking medication. 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 Pt currently a&o x 3. Unable to state current month. NAD noted at this time. Pt denies any pain  POA- [name] [id number]  ems vitals cbg-397 BP 240/100 HR- 76 R 18 37 CO2

## 2019-03-27 NOTE — ED Notes (Signed)
Pt reports not taking BP medications this AM

## 2019-03-27 NOTE — ED Provider Notes (Signed)
[facility] Emergency Department Provider Note  Time seen: 5:54 PM  I have reviewed the triage vital signs and the nursing notes.   HISTORY  Chief Complaint Fatigue and Failure To Thrive  HPI Holly Conner is a 78 y.o. female with a past medical history of arthritis, depression, diabetes, gastric reflux, hypertension, hyperlipidemia, prior CVA, presents to the emergency department for generalized fatigue/weakness.  According to the patient over the past several days she has had progressively worsening fatigue and weakness.  Today the patient was unable to get out of her chair and had to call EMS.  Patient denies any fever cough congestion chest pain abdominal pain dysuria.  Largely negative review of systems besides feeling very weak.  Patient has not eaten anything today states she is eaten very little over the past several days.   Patient appears weak but is otherwise well-appearing, no acute distress.  Oriented x4.  Past Medical History:  Diagnosis Date  . Arthritis   . Depression    controlled  . Diabetes mellitus without complication ([place])    managed well;   Marland Kitchen GERD (gastroesophageal reflux disease)   . Hepatitis   . HLD (hyperlipidemia)   . Hypertension    somewhat controlled  . 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 Polymyalgia rheumatica ([employer])   . Sleep apnea   . Stroke [facility])     Patient Active Problem List   Diagnosis Date Noted  . Hypertensive urgency 12/18/2018  . Stroke ([place]) 12/19/2015  . Type 2 diabetes mellitus ([place]) 12/19/2015  . HTN (hypertension) 12/19/2015  . GERD (gastroesophageal reflux disease) 12/19/2015  . HLD (hyperlipidemia) 12/19/2015  . CKD (chronic kidney disease), stage IV ([place]) 12/19/2015    Past Surgical History:  Procedure Laterality Date  . ABDOMINAL HYSTERECTOMY    . BILATERAL SALPINGOOPHORECTOMY    . ESOPHAGEAL DILATION    . OOPHORECTOMY    . REDUCTION MAMMAPLASTY Bilateral 2003  .  REPLACEMENT TOTAL KNEE Right   . TONSILLECTOMY AND ADENOIDECTOMY      Prior to Admission medications   Medication Sig Start Date End Date Taking? Authorizing Provider  Ascorbic Acid (VITAMIN C) 1000 MG tablet Take 1,000 mg by mouth daily.    [provider]  aspirin EC 81 MG tablet Take by mouth.    [provider]  atorvastatin (LIPITOR) 40 MG tablet Take 40 mg by mouth daily.    [provider]  benazepril (LOTENSIN) 40 MG tablet Take 40 mg by mouth 2 (two) times daily.     [provider]  Blood Glucose Monitoring Suppl (FIFTY50 GLUCOSE METER 2.0) w/Device KIT Use as instructed. 10/26/14   [provider]  chlorthalidone (HYGROTON) 25 MG tablet chlorthalidone 25 mg tablet 05/24/18   [provider]  citalopram (CELEXA) 20 MG tablet Take 40 mg by mouth daily.     [provider]  clopidogrel (PLAVIX) 75 MG tablet Take 1 tablet (75 mg total) by mouth daily. 12/20/15   Henreitta Leber, MD  Continuous Blood Gluc Sensor (FREESTYLE LIBRE 14 DAY SENSOR) MISC USE AS DIRECTED EVERY 14 DAYS 07/05/18   [provider]  Cyanocobalamin (VITAMIN B-12 PO) Take by mouth.    [provider]  fluticasone (FLONASE) 50 MCG/ACT nasal spray Place 2 sprays into both nostrils at bedtime.    [provider]  hydrALAZINE (APRESOLINE) 50 MG tablet Take 1 tablet (50 mg total) by mouth 3 (three) times daily.  12/20/18   Gouru, Illene Silver, MD  insulin aspart (NOVOLOG FLEXPEN) 100 UNIT/ML FlexPen Novolog Flexpen U-100 Insulin aspart 100 unit/mL (3 mL) subcutaneous 12/14/15   [provider]  Insulin Degludec (TRESIBA FLEXTOUCH) 200 UNIT/ML SOPN Tresiba FlexTouch U-200 insulin 200 unit/mL (3 mL) subcutaneous pen 04/03/16   [provider]  Insulin Pen Needle (FIFTY50 PEN NEEDLES) 31G X 8 MM MISC Use 1 needle to skin four times daily as directed with insulin pens 03/02/14   [provider]  liraglutide (VICTOZA) 18 MG/3ML  SOPN Victoza 3-Pak 0.6 mg/0.1 mL (18 mg/3 mL) subcutaneous pen injector 09/13/15   [provider]  loratadine (CLARITIN) 10 MG tablet Take 10 mg by mouth daily as needed for allergies.    [provider]  propranolol (INDERAL) 20 MG tablet Take 20 mg by mouth 2 (two) times daily.     [provider]  pyridOXINE (VITAMIN B-6) 100 MG tablet Take 100 mg by mouth daily.    [provider]  simvastatin (ZOCOR) 80 MG tablet simvastatin 80 mg tablet 04/23/18   [provider]  solifenacin (VESICARE) 10 MG tablet Take 1 tablet (10 mg total) by mouth daily. 10/11/18   Stoioff, Ronda Fairly, MD  traZODone (DESYREL) 100 MG tablet Take 100 mg by mouth at bedtime.    [provider]    Allergies  Allergen Reactions  . Iodinated Diagnostic Agents Shortness Of Breath    Only with nuclear stress test per pt Only with nuclear stress test per pt   . Penicillins Rash    Rash at injection site. Pt states she can take Amoxicillin & Keflex   . Escitalopram   . Lithium   . Erythromycin Nausea Only and Nausea And Vomiting    Other reaction(s): Other (See Comments) dehydration     Family History  Problem Relation Age of Onset  . CAD Brother   . Kidney cancer Mother   . Breast cancer Cousin     Social History Social History   Tobacco Use  . Smoking status: Never Smoker  . Smokeless tobacco: Never Used  Substance Use Topics  . Alcohol use: No    Alcohol/week: 0.0 standard drinks  . Drug use: No    Review of Systems Constitutional: Negative for fever. ENT: Negative for recent illness/congestion Cardiovascular: Negative for chest pain. Respiratory: Negative for shortness of breath.  For cough. Gastrointestinal: Negative for abdominal pain, vomiting and diarrhea. Genitourinary: Negative for urinary compaints Musculoskeletal: Negative for musculoskeletal complaints Skin: Negative for skin complaints  Neurological: Negative for headache All other  ROS negative  ____________________________________________   PHYSICAL EXAM:  VITAL SIGNS: ED Triage Vitals [03/27/19 1748]  Enc Vitals Group     BP      Pulse      Resp      Temp      Temp src      SpO2      Weight 190 lb (86.2 kg)     Height 5' 1" (1.549 m)     Head Circumference      Peak Flow      Pain Score 0     Pain Loc      Pain Edu?      Excl. in [place]?    Constitutional: Alert and oriented.  No acute distress.  Does appear weak/fatigued. Eyes: Normal exam ENT      Head: Normocephalic and atraumatic.      Mouth/Throat: Mucous membranes are moist. Cardiovascular: Normal rate, regular  rhythm.  Respiratory: Normal respiratory effort without tachypnea nor retractions. Breath sounds are clear  Gastrointestinal: Soft and nontender. No distention.  Musculoskeletal: Nontender with normal range of motion in all extremities.  Neurologic:  Normal speech and language. No gross focal neurologic deficits  Skin:  Skin is warm, dry and intact.  Psychiatric: Mood and affect are normal.   ____________________________________________    EKG  EKG viewed and interpreted by myself shows a normal sinus rhythm at 80 bpm with a slightly widened QRS, left axis deviation, largely normal intervals with nonspecific but no concerning ST changes  ____________________________________________    RADIOLOGY  Chest x-ray shows cardiomegaly. CT scan shows no acute abnormality  ____________________________________________   INITIAL IMPRESSION / ASSESSMENT AND PLAN / ED COURSE  Pertinent labs & imaging results that were available during my care of the patient were reviewed by me and considered in my medical decision making (see chart for details).   Patient presents to the emergency department for generalized fatigue/weakness.  Differential is quite broad at this time would include metabolic or electrolyte abnormality, dehydration/hypovolemia, infectious etiology.  We will check labs,  urinalysis, chest x-ray, COVID swab.  We will IV hydrate and continue to closely monitor.  Patient's lab work shows mild renal insufficiency compared to baseline of 1.7-2.1 currently 2.7.  Patient's troponin has increased from 71-82 on recheck.  Given the patient's persistent weakness elevated troponin renal insufficiency we will admit to the hospital service for further work-up and treatment.  Patient agreeable to plan of care.  Holly Conner was evaluated in Emergency Department on 03/27/2019 for the symptoms described in the history of present illness. She was evaluated in the context of the global COVID-19 pandemic, which necessitated consideration that the patient might be at risk for infection with the SARS-CoV-2 virus that causes COVID-19. Institutional protocols and algorithms that pertain to the evaluation of patients at risk for COVID-19 are in a state of rapid change based on information released by regulatory bodies including the CDC and federal and state organizations. These policies and algorithms were followed during the patient's care in the ED.  ____________________________________________   FINAL CLINICAL IMPRESSION(S) / ED DIAGNOSES  Weakness    Harvest Dark, MD 03/27/19 2220

## 2019-03-27 NOTE — ED Notes (Signed)
Pt climbed out of end of stretcher unassisted at this time. Pt redirected and placed back into bed. Pt very weak and unsteady when standing. Posey pad placed under pt at this time.

## 2019-03-28 ENCOUNTER — Other Ambulatory Visit: Payer: Self-pay

## 2019-03-28 ENCOUNTER — Inpatient Hospital Stay: Admit: 2019-03-28 | Payer: Medicare Other

## 2019-03-28 ENCOUNTER — Inpatient Hospital Stay: Payer: Medicare Other

## 2019-03-28 DIAGNOSIS — R7989 Other specified abnormal findings of blood chemistry: Secondary | ICD-10-CM | POA: Diagnosis present

## 2019-03-28 DIAGNOSIS — F05 Delirium due to known physiological condition: Secondary | ICD-10-CM | POA: Diagnosis not present

## 2019-03-28 DIAGNOSIS — Z96651 Presence of right artificial knee joint: Secondary | ICD-10-CM | POA: Diagnosis present

## 2019-03-28 DIAGNOSIS — R4701 Aphasia: Secondary | ICD-10-CM | POA: Diagnosis present

## 2019-03-28 DIAGNOSIS — E785 Hyperlipidemia, unspecified: Secondary | ICD-10-CM | POA: Diagnosis present

## 2019-03-28 DIAGNOSIS — R29703 NIHSS score 3: Secondary | ICD-10-CM | POA: Diagnosis present

## 2019-03-28 DIAGNOSIS — N179 Acute kidney failure, unspecified: Secondary | ICD-10-CM | POA: Diagnosis present

## 2019-03-28 DIAGNOSIS — Z91041 Radiographic dye allergy status: Secondary | ICD-10-CM | POA: Diagnosis not present

## 2019-03-28 DIAGNOSIS — E860 Dehydration: Secondary | ICD-10-CM | POA: Diagnosis present

## 2019-03-28 DIAGNOSIS — I129 Hypertensive chronic kidney disease with stage 1 through stage 4 chronic kidney disease, or unspecified chronic kidney disease: Secondary | ICD-10-CM | POA: Diagnosis present

## 2019-03-28 DIAGNOSIS — R269 Unspecified abnormalities of gait and mobility: Secondary | ICD-10-CM | POA: Diagnosis present

## 2019-03-28 DIAGNOSIS — Z8249 Family history of ischemic heart disease and other diseases of the circulatory system: Secondary | ICD-10-CM | POA: Diagnosis not present

## 2019-03-28 DIAGNOSIS — R531 Weakness: Secondary | ICD-10-CM | POA: Diagnosis present

## 2019-03-28 DIAGNOSIS — E1165 Type 2 diabetes mellitus with hyperglycemia: Secondary | ICD-10-CM | POA: Diagnosis present

## 2019-03-28 DIAGNOSIS — R4182 Altered mental status, unspecified: Secondary | ICD-10-CM | POA: Diagnosis not present

## 2019-03-28 DIAGNOSIS — Z20828 Contact with and (suspected) exposure to other viral communicable diseases: Secondary | ICD-10-CM | POA: Diagnosis present

## 2019-03-28 DIAGNOSIS — Z8051 Family history of malignant neoplasm of kidney: Secondary | ICD-10-CM | POA: Diagnosis not present

## 2019-03-28 DIAGNOSIS — I6340 Cerebral infarction due to embolism of unspecified cerebral artery: Secondary | ICD-10-CM | POA: Diagnosis present

## 2019-03-28 DIAGNOSIS — E1122 Type 2 diabetes mellitus with diabetic chronic kidney disease: Secondary | ICD-10-CM | POA: Diagnosis present

## 2019-03-28 DIAGNOSIS — I700 Atherosclerosis of aorta: Secondary | ICD-10-CM | POA: Diagnosis present

## 2019-03-28 DIAGNOSIS — Z880 Allergy status to penicillin: Secondary | ICD-10-CM | POA: Diagnosis not present

## 2019-03-28 DIAGNOSIS — T4145XA Adverse effect of unspecified anesthetic, initial encounter: Secondary | ICD-10-CM | POA: Diagnosis not present

## 2019-03-28 DIAGNOSIS — N184 Chronic kidney disease, stage 4 (severe): Secondary | ICD-10-CM | POA: Diagnosis present

## 2019-03-28 DIAGNOSIS — Z8673 Personal history of transient ischemic attack (TIA), and cerebral infarction without residual deficits: Secondary | ICD-10-CM | POA: Diagnosis not present

## 2019-03-28 LAB — CBC
HCT: 37.8 % (ref 36.0–46.0)
Hemoglobin: 13.1 g/dL (ref 12.0–15.0)
MCH: 29.3 pg (ref 26.0–34.0)
MCHC: 34.7 g/dL (ref 30.0–36.0)
MCV: 84.6 fL (ref 80.0–100.0)
Platelets: 399 10*3/uL (ref 150–400)
RBC: 4.47 MIL/uL (ref 3.87–5.11)
RDW: 13 % (ref 11.5–15.5)
WBC: 10.5 10*3/uL (ref 4.0–10.5)
nRBC: 0 % (ref 0.0–0.2)

## 2019-03-28 LAB — GLUCOSE, CAPILLARY
Glucose-Capillary: 238 mg/dL — ABNORMAL HIGH (ref 70–99)
Glucose-Capillary: 270 mg/dL — ABNORMAL HIGH (ref 70–99)
Glucose-Capillary: 273 mg/dL — ABNORMAL HIGH (ref 70–99)
Glucose-Capillary: 286 mg/dL — ABNORMAL HIGH (ref 70–99)
Glucose-Capillary: 295 mg/dL — ABNORMAL HIGH (ref 70–99)
Glucose-Capillary: 361 mg/dL — ABNORMAL HIGH (ref 70–99)
Glucose-Capillary: 364 mg/dL — ABNORMAL HIGH (ref 70–99)

## 2019-03-28 LAB — BASIC METABOLIC PANEL
Anion gap: 15 (ref 5–15)
BUN: 38 mg/dL — ABNORMAL HIGH (ref 8–23)
CO2: 21 mmol/L — ABNORMAL LOW (ref 22–32)
Calcium: 9.4 mg/dL (ref 8.9–10.3)
Chloride: 101 mmol/L (ref 98–111)
Creatinine, Ser: 2.67 mg/dL — ABNORMAL HIGH (ref 0.44–1.00)
GFR calc Af Amer: 19 mL/min — ABNORMAL LOW (ref >60)
GFR calc non Af Amer: 16 mL/min — ABNORMAL LOW (ref >60)
Glucose, Bld: 368 mg/dL — ABNORMAL HIGH (ref 70–99)
Potassium: 4 mmol/L (ref 3.5–5.1)
Sodium: 137 mmol/L (ref 135–145)

## 2019-03-28 LAB — TSH: TSH: 1.641 u[IU]/mL (ref 0.350–4.500)

## 2019-03-28 MED ORDER — LORATADINE 10 MG PO TABS: 10.0000 mg | ORAL_TABLET | Freq: Every day | ORAL | Status: DC | PRN

## 2019-03-28 MED ORDER — BENAZEPRIL HCL 20 MG PO TABS: 40.0000 mg | ORAL_TABLET | Freq: Two times a day (BID) | ORAL | Status: DC

## 2019-03-28 MED ORDER — DARIFENACIN HYDROBROMIDE ER 7.5 MG PO TB24
15.0000 mg | ORAL_TABLET | Freq: Every day | ORAL | Status: DC
  Administered 2019-03-28 – 2019-04-01 (×4): 15 mg via ORAL
  Filled 2019-03-28: qty 1
  Filled 2019-03-28: qty 2
  Filled 2019-03-28: qty 1
  Filled 2019-03-28: qty 2
  Filled 2019-03-28: qty 1
  Filled 2019-03-28: qty 2
  Filled 2019-03-28: qty 1

## 2019-03-28 MED ORDER — HYDRALAZINE HCL 20 MG/ML IJ SOLN
20.0000 mg | Freq: Once | INTRAMUSCULAR | Status: DC
  Filled 2019-03-28: qty 1

## 2019-03-28 MED ORDER — INSULIN ASPART 100 UNIT/ML ~~LOC~~ SOLN
0.0000 [IU] | Freq: Three times a day (TID) | SUBCUTANEOUS | Status: DC
  Administered 2019-03-28: 8 [IU] via SUBCUTANEOUS
  Administered 2019-03-28: 15 [IU] via SUBCUTANEOUS
  Administered 2019-03-29: 8 [IU] via SUBCUTANEOUS
  Administered 2019-03-29: 12:00:00 15 [IU] via SUBCUTANEOUS
  Administered 2019-03-29: 8 [IU] via SUBCUTANEOUS
  Administered 2019-03-30: 09:00:00 5 [IU] via SUBCUTANEOUS
  Administered 2019-03-30 – 2019-03-31 (×2): 11 [IU] via SUBCUTANEOUS
  Administered 2019-03-31: 12:00:00 3 [IU] via SUBCUTANEOUS
  Administered 2019-03-31: 8 [IU] via SUBCUTANEOUS
  Administered 2019-04-01: 15 [IU] via SUBCUTANEOUS
  Administered 2019-04-01: 5 [IU] via SUBCUTANEOUS
  Filled 2019-03-28 (×12): qty 1

## 2019-03-28 MED ORDER — ONDANSETRON HCL 4 MG/2ML IJ SOLN: 4.0000 mg | Freq: Four times a day (QID) | INTRAMUSCULAR | Status: DC | PRN

## 2019-03-28 MED ORDER — ADULT MULTIVITAMIN W/MINERALS CH
1.0000 | ORAL_TABLET | Freq: Every day | ORAL | Status: DC
  Administered 2019-03-29 – 2019-04-01 (×3): 1 via ORAL
  Filled 2019-03-28 (×4): qty 1

## 2019-03-28 MED ORDER — VITAMIN B-12 1000 MCG PO TABS
500.0000 ug | ORAL_TABLET | Freq: Every day | ORAL | Status: DC
  Administered 2019-03-28 – 2019-04-01 (×3): 500 ug via ORAL
  Filled 2019-03-28 (×5): qty 1

## 2019-03-28 MED ORDER — NITROGLYCERIN 2 % TD OINT
0.5000 [in_us] | TOPICAL_OINTMENT | Freq: Four times a day (QID) | TRANSDERMAL | Status: DC
  Administered 2019-03-28 – 2019-04-01 (×16): 0.5 [in_us] via TOPICAL
  Filled 2019-03-28 (×15): qty 1

## 2019-03-28 MED ORDER — ATORVASTATIN CALCIUM 80 MG PO TABS
80.0000 mg | ORAL_TABLET | Freq: Every day | ORAL | Status: DC
  Administered 2019-03-28 – 2019-03-31 (×4): 80 mg via ORAL
  Filled 2019-03-28 (×3): qty 1

## 2019-03-28 MED ORDER — HEPARIN SODIUM (PORCINE) 5000 UNIT/ML IJ SOLN
5000.0000 [IU] | Freq: Three times a day (TID) | INTRAMUSCULAR | Status: DC
  Administered 2019-03-28 – 2019-04-01 (×14): 5000 [IU] via SUBCUTANEOUS
  Filled 2019-03-28 (×14): qty 1

## 2019-03-28 MED ORDER — ENOXAPARIN SODIUM 30 MG/0.3ML ~~LOC~~ SOLN: 30.0000 mg | SUBCUTANEOUS | Status: DC

## 2019-03-28 MED ORDER — AMLODIPINE BESYLATE 10 MG PO TABS
10.0000 mg | ORAL_TABLET | Freq: Every day | ORAL | Status: DC
  Administered 2019-03-28 – 2019-04-01 (×5): 10 mg via ORAL
  Filled 2019-03-28 (×5): qty 1

## 2019-03-28 MED ORDER — INSULIN GLARGINE 100 UNIT/ML ~~LOC~~ SOLN
22.0000 [IU] | Freq: Every day | SUBCUTANEOUS | Status: DC
  Administered 2019-03-28 – 2019-03-29 (×2): 22 [IU] via SUBCUTANEOUS
  Filled 2019-03-28 (×3): qty 0.22

## 2019-03-28 MED ORDER — ASPIRIN EC 325 MG PO TBEC
325.0000 mg | DELAYED_RELEASE_TABLET | Freq: Every day | ORAL | Status: DC
  Administered 2019-03-28 – 2019-04-01 (×4): 325 mg via ORAL
  Filled 2019-03-28 (×5): qty 1

## 2019-03-28 MED ORDER — STROKE: EARLY STAGES OF RECOVERY BOOK
Freq: Once | Status: AC
  Administered 2019-03-28: 06:00:00

## 2019-03-28 MED ORDER — VITAMIN B-6 50 MG PO TABS
100.0000 mg | ORAL_TABLET | Freq: Every day | ORAL | Status: DC
  Administered 2019-03-28 – 2019-04-01 (×4): 100 mg via ORAL
  Filled 2019-03-28 (×5): qty 2

## 2019-03-28 MED ORDER — SODIUM CHLORIDE 0.9% FLUSH
3.0000 mL | Freq: Two times a day (BID) | INTRAVENOUS | Status: DC
  Administered 2019-03-28 – 2019-04-01 (×5): 3 mL via INTRAVENOUS

## 2019-03-28 MED ORDER — PROPRANOLOL HCL 40 MG PO TABS
60.0000 mg | ORAL_TABLET | Freq: Every day | ORAL | Status: DC
  Administered 2019-03-28 – 2019-03-31 (×4): 60 mg via ORAL
  Filled 2019-03-28 (×5): qty 1

## 2019-03-28 MED ORDER — INSULIN ASPART 100 UNIT/ML ~~LOC~~ SOLN
0.0000 [IU] | Freq: Every day | SUBCUTANEOUS | Status: DC
  Administered 2019-03-28: 2 [IU] via SUBCUTANEOUS
  Administered 2019-03-29: 3 [IU] via SUBCUTANEOUS
  Administered 2019-03-30: 5 [IU] via SUBCUTANEOUS
  Administered 2019-03-31: 3 [IU] via SUBCUTANEOUS
  Filled 2019-03-28 (×4): qty 1

## 2019-03-28 MED ORDER — ONDANSETRON HCL 4 MG PO TABS: 4.0000 mg | ORAL_TABLET | Freq: Four times a day (QID) | ORAL | Status: DC | PRN

## 2019-03-28 MED ORDER — HALOPERIDOL LACTATE 5 MG/ML IJ SOLN
2.0000 mg | Freq: Four times a day (QID) | INTRAMUSCULAR | Status: DC | PRN
  Administered 2019-03-30: 2 mg via INTRAVENOUS
  Filled 2019-03-28: qty 1

## 2019-03-28 MED ORDER — ACETAMINOPHEN 650 MG RE SUPP: 650.0000 mg | Freq: Four times a day (QID) | RECTAL | Status: DC | PRN

## 2019-03-28 MED ORDER — LOSARTAN POTASSIUM 50 MG PO TABS: 50.0000 mg | ORAL_TABLET | Freq: Every day | ORAL | Status: DC

## 2019-03-28 MED ORDER — SODIUM CHLORIDE 0.9 % IV SOLN
INTRAVENOUS | Status: DC
  Administered 2019-03-28 – 2019-03-30 (×6): via INTRAVENOUS

## 2019-03-28 MED ORDER — ENSURE MAX PROTEIN PO LIQD
11.0000 [oz_av] | Freq: Two times a day (BID) | ORAL | Status: DC
  Administered 2019-03-28 – 2019-04-01 (×3): 11 [oz_av] via ORAL
  Filled 2019-03-28: qty 330

## 2019-03-28 MED ORDER — POLYETHYLENE GLYCOL 3350 17 G PO PACK: 17.0000 g | PACK | Freq: Every day | ORAL | Status: DC | PRN

## 2019-03-28 MED ORDER — ACETAMINOPHEN 325 MG PO TABS: 650.0000 mg | ORAL_TABLET | Freq: Four times a day (QID) | ORAL | Status: DC | PRN

## 2019-03-28 NOTE — ED Notes (Signed)
ED TO INPATIENT HANDOFF REPORT  ED Nurse Name and Phone #:  Quillian Quince [place] Name/Age/Gender Holly Conner 78 y.o. female Room/Bed: ED11A/ED11A  Code Status   Code Status: Prior  Home/SNF/Other Home Patient oriented to: self and situation Is this baseline? Yes   Triage Complete: Triage complete  Chief Complaint Weakness  Triage Note pt arrives via ems from home. ems reports c/o weakness.  ems initally called out to help get out of chair. ems reports pt room mate has not been eat or drinking for several days, and not taking medication. 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 Pt currently a&o x 3. Unable to state current month. NAD noted at this time. Pt denies any pain  POA- [name] [phone]  ems vitals cbg-397 BP 240/100 HR- 76 R 18 37 CO2    Allergies Allergies  Allergen Reactions  . Iodinated Diagnostic Agents Shortness Of Breath    Only with nuclear stress test per pt Only with nuclear stress test per pt   . Penicillins Rash    Rash at injection site. Pt states she can take Amoxicillin & Keflex   . Escitalopram   . Lithium   . Erythromycin Nausea Only and Nausea And Vomiting    Other reaction(s): Other (See Comments) dehydration     Level of Care/Admitting Diagnosis ED Disposition    ED Disposition Condition [facility] Area: [place] [[id number]]  Level of Care: Med-Surg [16]  Covid Evaluation: Confirmed COVID Negative  Diagnosis: Weakness generalized [[id number]]  Admitting Physician: Mayer Camel [[id number]]  Attending Physician: Mayer Camel [[id number]]  Estimated length of stay: past midnight tomorrow  Certification:: I certify this patient will need inpatient services for at least 2 midnights  PT Class (Do Not Modify): Inpatient [101]  PT Acc Code (Do Not Modify): Private [1]       B Medical/Surgery History Past Medical History:  Diagnosis Date  . Arthritis   . Depression     controlled  . Diabetes mellitus without complication ([place])    managed well;   Marland Kitchen GERD (gastroesophageal reflux disease)   . Hepatitis   . HLD (hyperlipidemia)   . Hypertension    somewhat controlled  . 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 Polymyalgia rheumatica ([place])   . Sleep apnea   . Stroke [facility])    Past Surgical History:  Procedure Laterality Date  . ABDOMINAL HYSTERECTOMY    . BILATERAL SALPINGOOPHORECTOMY    . ESOPHAGEAL DILATION    . OOPHORECTOMY    . REDUCTION MAMMAPLASTY Bilateral 2003  . REPLACEMENT TOTAL KNEE Right   . TONSILLECTOMY AND ADENOIDECTOMY       A IV Location/Drains/Wounds Patient Lines/Drains/Airways Status   Active Line/Drains/Airways    Name:   Placement date:   Placement time:   Site:   Days:   Peripheral IV 03/27/19 Left Forearm   03/27/19    1737    Forearm   1          Intake/Output Last 24 hours No intake or output data in the 24 hours ending 03/28/19 0106  Labs/Imaging Results for orders placed or performed during the hospital encounter of 03/27/19 (from the past 48 hour(s))  CBC     Status: None   Collection Time: 03/27/19  6:04 PM  Result Value Ref Range   WBC 10.1 4.0 - 10.5 K/uL   RBC 4.49 3.87 -  5.11 MIL/uL   Hemoglobin 13.2 12.0 - 15.0 g/dL   HCT 37.7 36.0 - 46.0 %   MCV 84.0 80.0 - 100.0 fL   MCH 29.4 26.0 - 34.0 pg   MCHC 35.0 30.0 - 36.0 g/dL   RDW 12.7 11.5 - 15.5 %   Platelets 390 150 - 400 K/uL   nRBC 0.0 0.0 - 0.2 %    Comment: Performed at [facility], [address] [place], [place] [address]  Comprehensive metabolic panel     Status: Abnormal   Collection Time: 03/27/19  6:04 PM  Result Value Ref Range   Sodium 134 (L) 135 - 145 mmol/L   Potassium 4.1 3.5 - 5.1 mmol/L   Chloride 98 98 - 111 mmol/L   CO2 24 22 - 32 mmol/L   Glucose, Bld 371 (H) 70 - 99 mg/dL   BUN 40 (H) 8 - 23 mg/dL   Creatinine, Ser 2.70 (H) 0.44 - 1.00 mg/dL   Calcium 9.3 8.9 - 10.3 mg/dL   Total  Protein 7.0 6.5 - 8.1 g/dL   Albumin 3.7 3.5 - 5.0 g/dL   AST 17 15 - 41 U/L   ALT 14 0 - 44 U/L   Alkaline Phosphatase 79 38 - 126 U/L   Total Bilirubin 1.1 0.3 - 1.2 mg/dL   GFR calc non Af Amer 16 (L) >60 mL/min   GFR calc Af Amer 19 (L) >60 mL/min   Anion gap 12 5 - 15    Comment: Performed at [facility], [place], [place] [address]  Troponin I (High Sensitivity)     Status: Abnormal   Collection Time: 03/27/19  6:04 PM  Result Value Ref Range   Troponin I (High Sensitivity) 71 (H) <18 ng/L    Comment: (NOTE) Elevated high sensitivity troponin I (hsTnI) values and significant  changes across serial measurements may suggest ACS but many other  chronic and acute conditions are known to elevate hsTnI results.  Refer to the "Links" section for chest pain algorithms and additional  guidance. Performed at [facility], [address], [place], [place] [address]   SARS Coronavirus 2 (CEPHEID - Performed in [facility] hospital lab), Hosp Order     Status: None   Collection Time: 03/27/19  6:04 PM   Specimen: Nasopharyngeal Swab  Result Value Ref Range   SARS Coronavirus 2 NEGATIVE NEGATIVE    Comment: (NOTE) If result is NEGATIVE SARS-CoV-2 target nucleic acids are NOT DETECTED. The SARS-CoV-2 RNA is generally detectable in upper and lower  respiratory specimens during the acute phase of infection. The lowest  concentration of SARS-CoV-2 viral copies this assay can detect is 250  copies / mL. A negative result does not preclude SARS-CoV-2 infection  and should not be used as the sole basis for treatment or other  patient management decisions.  A negative result may occur with  improper specimen collection / handling, submission of specimen other  than nasopharyngeal swab, presence of viral mutation(s) within the  areas targeted by this assay, and inadequate number of viral copies  (<250 copies / mL). A negative result must be combined with  clinical  observations, patient history, and epidemiological information. If result is POSITIVE SARS-CoV-2 target nucleic acids are DETECTED. The SARS-CoV-2 RNA is generally detectable in upper and lower  respiratory specimens dur ing the acute phase of infection.  Positive  results are indicative of active infection with SARS-CoV-2.  Clinical  correlation with patient  history and other diagnostic information is  necessary to determine patient infection status.  Positive results do  not rule out bacterial infection or co-infection with other viruses. If result is PRESUMPTIVE POSTIVE SARS-CoV-2 nucleic acids MAY BE PRESENT.   A presumptive positive result was obtained on the submitted specimen  and confirmed on repeat testing.  While 2019 novel coronavirus  (SARS-CoV-2) nucleic acids may be present in the submitted sample  additional confirmatory testing may be necessary for epidemiological  and / or clinical management purposes  to differentiate between  SARS-CoV-2 and other Sarbecovirus currently known to infect humans.  If clinically indicated additional testing with an alternate test  methodology [phone]) is advised. The SARS-CoV-2 RNA is generally  detectable in upper and lower respiratory sp ecimens during the acute  phase of infection. The expected result is Negative. Fact Sheet for Patients:  StrictlyIdeas.no Fact Sheet for Healthcare Providers: BankingDealers.co.za This test is not yet approved or cleared by the [place] FDA and has been authorized for detection and/or diagnosis of SARS-CoV-2 by FDA under an Emergency Use Authorization (EUA).  This EUA will remain in effect (meaning this test can be used) for the duration of the COVID-19 declaration under Section 564(b)(1) of the Act, 21 U.S.C. section 360bbb-3(b)(1), unless the authorization is terminated or revoked sooner. Performed at [facility], [address] [place], [place] [address]   Urinalysis, Complete w Microscopic     Status: Abnormal   Collection Time: 03/27/19  6:04 PM  Result Value Ref Range   Color, Urine YELLOW (A) YELLOW   APPearance CLEAR (A) CLEAR   Specific Gravity, Urine 1.010 1.005 - 1.030   pH 7.0 5.0 - 8.0   Glucose, UA >=500 (A) NEGATIVE mg/dL   Hgb urine dipstick NEGATIVE NEGATIVE   Bilirubin Urine NEGATIVE NEGATIVE   Ketones, ur 5 (A) NEGATIVE mg/dL   Protein, ur 100 (A) NEGATIVE mg/dL   Nitrite NEGATIVE NEGATIVE   Leukocytes,Ua NEGATIVE NEGATIVE   RBC / HPF 0-5 0 - 5 RBC/hpf   WBC, UA 0-5 0 - 5 WBC/hpf   Bacteria, UA NONE SEEN NONE SEEN   Squamous Epithelial / LPF 0-5 0 - 5    Comment: Performed at [facility], [place], [place] [address]  Troponin I (High Sensitivity)     Status: Abnormal   Collection Time: 03/27/19  8:21 PM  Result Value Ref Range   Troponin I (High Sensitivity) 82 (H) <18 ng/L    Comment: (NOTE) Elevated high sensitivity troponin I (hsTnI) values and significant  changes across serial measurements may suggest ACS but many other  chronic and acute conditions are known to elevate hsTnI results.  Refer to the "Links" section for chest pain algorithms and additional  guidance. Performed at [facility], [place], [place] [address]   Glucose, capillary     Status: Abnormal   Collection Time: 03/27/19  8:59 PM  Result Value Ref Range   Glucose-Capillary 338 (H) 70 - 99 mg/dL  Glucose, capillary     Status: Abnormal   Collection Time: 03/27/19 10:04 PM  Result Value Ref Range   Glucose-Capillary 250 (H) 70 - 99 mg/dL  Glucose, capillary     Status: Abnormal   Collection Time: 03/28/19 12:09 AM  Result Value Ref Range   Glucose-Capillary 295 (H) 70 - 99 mg/dL   Ct Head Wo Contrast  Result Date: 03/27/2019 CLINICAL DATA:  78 year old female with acute weakness. EXAM: CT  HEAD WITHOUT CONTRAST TECHNIQUE: Contiguous axial images were  obtained from the base of the skull through the vertex without intravenous contrast. COMPARISON:  12/18/2018 CT and prior studies FINDINGS: Brain: No evidence of acute infarction, hemorrhage, hydrocephalus, extra-axial collection or mass lesion/mass effect. Atrophy and chronic small-vessel white matter ischemic changes again noted. Vascular: Mild carotid atherosclerotic calcifications again noted. Skull: Normal. Negative for fracture or focal lesion. Sinuses/Orbits: No acute finding. Other: None. IMPRESSION: 1. No evidence of acute intracranial abnormality. 2. Atrophy and chronic small-vessel white matter ischemic changes. Electronically Signed   By: Margarette Canada M.D.   On: 03/27/2019 19:54   Dg Chest Portable 1 View  Result Date: 03/27/2019 CLINICAL DATA:  Weakness for several days. EXAM: PORTABLE CHEST 1 VIEW COMPARISON:  PA and lateral chest 12/19/2018 and full 12/19/2015. FINDINGS: Lungs are clear. There is cardiomegaly. No pneumothorax or pleural fluid. No acute or focal bony abnormality. IMPRESSION: Cardiomegaly without acute disease. Electronically Signed   By: Inge Rise M.D.   On: 03/27/2019 18:14    Pending Labs [employer] (From admission, onward)    Start     Ordered   Signed and Held  Hemoglobin A1c  Once,   R    Comments: To assess prior glycemic control    Signed and Held   Signed and Held  CBC  (enoxaparin (LOVENOX)    CrCl < 30 ml/min)  Once,   R    Comments: Baseline for enoxaparin therapy IF NOT ALREADY DRAWN.  Notify MD if PLT < 100 K.    Signed and Held   Signed and Held  Creatinine, serum  (enoxaparin (LOVENOX)    CrCl < 30 ml/min)  Once,   R    Comments: Baseline for enoxaparin therapy IF NOT ALREADY DRAWN.    Signed and Held   Signed and Held  Creatinine, serum  (enoxaparin (LOVENOX)    CrCl < 30 ml/min)  Weekly,   R    Comments: while on enoxaparin therapy.    Signed and Held   Signed and Held  TSH  Once,   R     Signed and Held   Signed and Held   Hemoglobin A1c  Once,   R     Signed and Held   Signed and Held  Basic metabolic panel  Tomorrow morning,   R     Signed and Held   Signed and Held  CBC  Tomorrow morning,   R     Signed and Held          Vitals/Pain Today's Vitals   03/27/19 2231 03/27/19 2300 03/28/19 0000 03/28/19 0030  BP: (!) 157/63 (!) 170/64 (!) 166/67 (!) 174/61  Pulse: 88 84 88 84  Resp: (!) 21 19 (!) 25   Temp:      TempSrc:      SpO2: 96% 97% 96% 96%  Weight:      Height:      PainSc:        Isolation Precautions No active isolations  Medications Medications  benazepril (LOTENSIN) tablet 40 mg (has no administration in time range)  chlorthalidone (HYGROTON) tablet 25 mg (has no administration in time range)  hydrALAZINE (APRESOLINE) tablet 50 mg (50 mg Oral Given 03/27/19 1932)  hydrALAZINE (APRESOLINE) injection 5 mg (5 mg Intravenous Given 03/27/19 1932)  insulin aspart (novoLOG) injection 8 Units (8 Units Intravenous Given 03/27/19 2128)  hydrALAZINE (APRESOLINE) injection 5 mg (5 mg Intravenous Given 03/27/19 2153)  Mobility walks with person assist     Focused Assessments Neuro Assessment Handoff:  Swallow screen pass? N/A         Neuro Assessment: Exceptions to WDL Neuro Checks:      Last Documented NIHSS Modified Score:   Has TPA been given? No If patient is a Neuro Trauma and patient is going to OR before floor call report to [place] nurse: [phone] or [phone]     R Recommendations: See Admitting Provider Note  Report given to:   Additional Notes:

## 2019-03-28 NOTE — Evaluation (Signed)
Clinical/Bedside Swallow Evaluation Patient Details  Name: Holly Conner MRN: [id number] Date of Birth: 01/06/1941  Today's Date: 03/28/2019 Time: SLP Start Time (ACUTE ONLY): 8 SLP Stop Time (ACUTE ONLY): 1200 SLP Time Calculation (min) (ACUTE ONLY): 30 min  Past Medical History:  Past Medical History:  Diagnosis Date  . Arthritis   . Depression    controlled  . Diabetes mellitus without complication ([place])    managed well;   Marland Kitchen GERD (gastroesophageal reflux disease)   . Hepatitis   . HLD (hyperlipidemia)   . Hypertension    somewhat controlled  . 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 Polymyalgia rheumatica ([place])   . Sleep apnea   . Stroke [facility])    Past Surgical History:  Past Surgical History:  Procedure Laterality Date  . ABDOMINAL HYSTERECTOMY    . BILATERAL SALPINGOOPHORECTOMY    . ESOPHAGEAL DILATION    . OOPHORECTOMY    . REDUCTION MAMMAPLASTY Bilateral 2003  . REPLACEMENT TOTAL KNEE Right   . TONSILLECTOMY AND ADENOIDECTOMY     HPI:  H&P 7//30/2020: "Holly Conner  is a 78 y.o. female with a known history of prior CVA, hypertension, hyperlipidemia, diabetes mellitus, depression, GERD, and arthritis.  She presented to the emergency room via EMS services reporting progressively worsening fatigue and weakness over the last 4 days.  She reports she has been becoming weaker over the last 4 days however she became unable to ambulate today.  She usually ambulates using the assistance of a cane.  She reports she was unable to get out of her chair and therefore had to call EMS services for assistance.  She reports decreased p.o. intake over the last 3 to 4 days which she attributes to decreased appetite as well as her inability to ambulate well and prepare food.  She denies recent illness.  She denies fever, chills, nausea, vomiting, diarrhea.  She denies chest pain or shortness of breath.  She denies abdominal pain.  She denies dysuria.  Patient also  has a history of CKD.  On arrival BUN is 40 with creatinine 2.70.  She has elevated troponin of 82 which is increased from 71.  EKG shows no significant changes.  CT head was also completed with no acute intracranial abnormalities.  Glucose on arrival is 400."   Assessment / Plan / Recommendation Clinical Impression  The patient is presenting with safe oropharyngeal swallowing.  She is able to drink thin liquids via straw with no cough or other clinical indicators of aspiration.  She is able to masticate, swallow graham cracker and self-feed apple sauce with no oral residue or clinical indicators of aspiration.  Recommend mechanical soft diet with thin liquids.  The patient does not need further speech therapy services, will sign off.  SLP Visit Diagnosis: Dysphagia, unspecified (R13.10)    Aspiration Risk  No limitations    Diet Recommendation Regular;Thin liquid   Liquid Administration via: Cup;Straw Medication Administration: Whole meds with liquid Supervision: Patient able to self feed    Other  Recommendations Oral Care Recommendations: Oral care BID   Follow up Recommendations None      Frequency and Duration            Prognosis Prognosis for Safe Diet Advancement: Good      Swallow Study   General Date of Onset: 03/27/19 HPI: H&P 7//30/2020: "Holly Conner  is a 78 y.o. female with a known history of prior CVA, hypertension, hyperlipidemia, diabetes mellitus,  depression, GERD, and arthritis.  She presented to the emergency room via EMS services reporting progressively worsening fatigue and weakness over the last 4 days.  She reports she has been becoming weaker over the last 4 days however she became unable to ambulate today.  She usually ambulates using the assistance of a cane.  She reports she was unable to get out of her chair and therefore had to call EMS services for assistance.  She reports decreased p.o. intake over the last 3 to 4 days which she attributes to  decreased appetite as well as her inability to ambulate well and prepare food.  She denies recent illness.  She denies fever, chills, nausea, vomiting, diarrhea.  She denies chest pain or shortness of breath.  She denies abdominal pain.  She denies dysuria.  Patient also has a history of CKD.  On arrival BUN is 40 with creatinine 2.70.  She has elevated troponin of 82 which is increased from 71.  EKG shows no significant changes.  CT head was also completed with no acute intracranial abnormalities.  Glucose on arrival is 400." Type of Study: Bedside Swallow Evaluation Diet Prior to this Study: NPO Temperature Spikes Noted: No Respiratory Status: Room air Behavior/Cognition: Alert;Cooperative;Pleasant mood Oral Cavity Assessment: Within Functional Limits Oral Cavity - Dentition: Dentures, top;Dentures, bottom Self-Feeding Abilities: Able to feed self Patient Positioning: Upright in bed Baseline Vocal Quality: Normal Volitional Cough: Strong Volitional Swallow: Able to elicit    Oral/Motor/Sensory Function Overall Oral Motor/Sensory Function: Within functional limits   Ice Chips     Thin Liquid Thin Liquid: Within functional limits Presentation: Cup;Straw;Self Fed    Nectar Thick     Honey Thick     Puree Puree: Within functional limits Presentation: Self Fed;Spoon   Solid     Solid: Within functional limits(Graham cracker) Presentation: Self Fed     Leroy Sea, [place] 03/28/2019,12:18 PM

## 2019-03-28 NOTE — Progress Notes (Addendum)
Inpatient Diabetes Program Recommendations  AACE/ADA: New Consensus Statement on Inpatient Glycemic Control (2015)  Target Ranges:  Prepandial:   less than 140 mg/dL      Peak postprandial:   less than 180 mg/dL (1-2 hours)      Critically ill patients:  140 - 180 mg/dL   Results for Holly Conner, Holly Conner (MRN [id number]) as of 03/28/2019 08:34  Ref. Range 03/27/2019 20:59 03/27/2019 22:04 03/28/2019 00:09 03/28/2019 01:57 03/28/2019 07:45  Glucose-Capillary Latest Ref Range: 70 - 99 mg/dL 338 (H) 250 (H)  8 units NOVOLOG 295 (H) 273 (H) 361 (H)    Admit: Progressively worsening fatigue and weakness over the last 4 days  History: DM, CVA, CKD  Home DM Meds: Novolog 7-9 units TID with meals       Tresiba 22 units Daily       Victoza  Daily  Current Orders: Novolog Moderate Correction Scale/ SSI (0-15 units) TID AC + HS     MD- Note patient takes [place] insulin 22 units Daily at home  BMET from 6:30 am shows: Lab glucose 368 mg/dl, Anion Gap 15,and CO2 level 21  Please consider starting Lantus 22 units Daily this AM asap.   Addendum 3pm- Sent Secure Chat to Dr. Estanislado Pandy at 8:41am today and alerted him to pt's CBG of 361 mg/dl this AM (BMET showed glucose 368 mg/dl, Anion Gap 15, and CO2 21).  Per records, patient takes [place] Insulin 22 units Daily at home.  Asked Dr. Estanislado Pandy if he would order Lantus 22 units Daily for this patient and start this AM.  Dr. Estanislado Pandy then ordered Lantus 22 units daily at 8:42am today.  Checked MAR around 11am today and noted Lantus 22 units was not given--Reason "Pt NPO".  Sent Secure Chat to Dr. Estanislado Pandy at 10:56am and asked him if he wanted the Lantus to be given to the pt this AM despite pt being NPO.  Dr. Estanislado Pandy replied "Sure".  I called Levada Dy the RN caring for pt today and asked her about the Lantus dose.  Levada Dy told me she had talked with Dr. Estanislado Pandy earlier and he told her not to give the Lantus.  I discussed with Levada Dy that I had just communicated  with Dr. Estanislado Pandy by Secure Chat and he said it was OK to go ahead and give the Lantus to the pt this AM.  RN concerned about giving Lantus while pt NPO and that pt had gotten 15 units Novolog at 9:50am.  Discussed with RN that the basal insulin Lantus is OK to give even when a pt is NPO b/c it is covering the pt's basal needs.  RN stated she would check pt around 12pm and re-evaluate.  No CBG taken as of 2pm.  I sent a secure Chat to the RN asking if the NT had checked a CBG for the patient and asked about the status of the Lantus.     --Will follow patient during hospitalization--  Wyn Quaker RN, MSN, CDE Diabetes Coordinator Inpatient Glycemic Control Team Team Pager: [phone] (8a-5p)

## 2019-03-28 NOTE — Progress Notes (Signed)
Initial Nutrition Assessment  DOCUMENTATION CODES:   Obesity unspecified  INTERVENTION:   Ensure Max protein supplement BID, each supplement provides 150kcal and 30g of protein.  MVI daily   NUTRITION DIAGNOSIS:   Inadequate oral intake related to acute illness as evidenced by per patient/family report.  GOAL:   Patient will meet greater than or equal to 90% of their needs  MONITOR:   PO intake, Supplement acceptance, Labs, Weight trends, Skin, I & O's  REASON FOR ASSESSMENT:   Malnutrition Screening Tool    ASSESSMENT:   78 y.o. female with a past medical history of arthritis, depression, diabetes, gastric reflux, hypertension, hyperlipidemia, prior CVA, presents to the emergency department for generalized fatigue/weakness.  RD working remotely.  Pt reports poor appetite and oral intake for 3-4 days pta. Pt reports her appetite is returned today. Pt is asking to eat. Pt is willing to drink supplements while in hospital. RD will add supplements and MVI to help pt meet her estimated needs. Per chart, pt with 22lb(10%) weight loss over the past 3 months. Pt reports that weight loss is intentional as she has been dieting and exercising to try and loose weight.   Medications reviewed and include: heparin, insulin, B12  Labs reviewed: BUN 38(H), creat 2.67(H) cbgs- 295, 273, 361 x 24 hrs  Unable to complete Nutrition-Focused physical exam at this time.   Diet Order:   Diet Order            Diet NPO time specified  Diet effective now             EDUCATION NEEDS:   Education needs have been addressed  Skin:  Skin Assessment: Reviewed RN Assessment  Last BM:  pta  Height:   Ht Readings from Last 1 Encounters:  03/27/19 5\' 1"  (1.549 m)    Weight:   Wt Readings from Last 1 Encounters:  03/27/19 86.2 kg    Ideal Body Weight:  47.7 kg  BMI:  Body mass index is 35.9 kg/m.  Estimated Nutritional Needs:   Kcal:  1600-1800kcal/day  Protein:   80-90g/day  Fluid:  >1.4L/day  Koleen Distance MS, RD, LDN Pager #- [phone] Office#- [phone] After Hours Pager: [phone]

## 2019-03-28 NOTE — Progress Notes (Signed)
Notified Dr. Marcille Blanco of Bladder Scan 594 as pnt is on MIVF and no urine out since arrival to unit. Pnt reports she doesn't feel like peeing.  I&O pulled 500 ml's of amber urine. Will continue to monitor

## 2019-03-28 NOTE — Plan of Care (Signed)

## 2019-03-28 NOTE — H&P (Addendum)
[place] [facility] [place] NAME: Holly Conner    MR#:  [id number]  DATE OF BIRTH:  1940-11-30  DATE OF ADMISSION:  03/27/2019  PRIMARY CARE PHYSICIAN: Dion Body, MD   REQUESTING/REFERRING PHYSICIAN: Harvest Dark, MD  CHIEF COMPLAINT:   Chief Complaint  Patient presents with  . Fatigue  . Failure To Thrive    HISTORY OF PRESENT ILLNESS:  Holly Conner  is a 78 y.o. female with a known history of prior CVA, hypertension, hyperlipidemia, diabetes mellitus, depression, GERD, and arthritis.  She presented to the emergency room via EMS services reporting progressively worsening fatigue and weakness over the last 4 days.  She reports she has been becoming weaker over the last 4 days however she became unable to ambulate today.  She usually ambulates using the assistance of a cane.  She reports she was unable to get out of her chair and therefore had to call EMS services for assistance.  She reports decreased p.o. intake over the last 3 to 4 days which she attributes to decreased appetite as well as her inability to ambulate well and prepare food.  She denies recent illness.  She denies fever, chills, nausea, vomiting, diarrhea.  She denies chest pain or shortness of breath.  She denies abdominal pain.  She denies dysuria.  Patient also has a history of CKD.  On arrival BUN is 40 with creatinine 2.70.  She has elevated troponin of 82 which is increased from 71.  EKG shows no significant changes.  CT head was also completed with no acute intracranial abnormalities.  Glucose on arrival is 400.  I have attempted to contact patient's niece who is listed as her emergency contact however there is no answer.  She has been admitted to the hospitalist service for further evaluation and management.  PAST MEDICAL HISTORY:   Past Medical History:  Diagnosis Date  . Arthritis   . Depression    controlled  . Diabetes mellitus without  complication ([place])    managed well;   Marland Kitchen GERD (gastroesophageal reflux disease)   . Hepatitis   . HLD (hyperlipidemia)   . Hypertension    somewhat controlled  . 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 Polymyalgia rheumatica ([place])   . Sleep apnea   . Stroke [facility])     PAST SURGICAL HISTORY:   Past Surgical History:  Procedure Laterality Date  . ABDOMINAL HYSTERECTOMY    . BILATERAL SALPINGOOPHORECTOMY    . ESOPHAGEAL DILATION    . OOPHORECTOMY    . REDUCTION MAMMAPLASTY Bilateral 2003  . REPLACEMENT TOTAL KNEE Right   . TONSILLECTOMY AND ADENOIDECTOMY      SOCIAL HISTORY:   Social History   Tobacco Use  . Smoking status: Never Smoker  . Smokeless tobacco: Never Used  Substance Use Topics  . Alcohol use: No    Alcohol/week: 0.0 standard drinks    FAMILY HISTORY:   Family History  Problem Relation Age of Onset  . CAD Brother   . Kidney cancer Mother   . Breast cancer Cousin     DRUG ALLERGIES:   Allergies  Allergen Reactions  . Iodinated Diagnostic Agents Shortness Of Breath    Only with nuclear stress test per pt Only with nuclear stress test per pt   . Penicillins Rash    Rash at injection site. Pt states she can take Amoxicillin & Keflex   . Escitalopram   .  Lithium   . Erythromycin Nausea Only and Nausea And Vomiting    Other reaction(s): Other (See Comments) dehydration     REVIEW OF SYSTEMS:   Review of Systems  Constitutional: Positive for malaise/fatigue. Negative for chills and fever.  HENT: Negative for congestion, sinus pain and sore throat.   Eyes: Negative for blurred vision and double vision.  Respiratory: Negative for cough, sputum production, shortness of breath and wheezing.   Cardiovascular: Negative for chest pain, palpitations and leg swelling.  Gastrointestinal: Positive for nausea. Negative for abdominal pain, blood in stool, constipation, diarrhea, heartburn and vomiting.  Genitourinary: Negative for  dysuria, flank pain and hematuria.  Musculoskeletal: Negative for falls and myalgias.  Skin: Negative for itching and rash.  Neurological: Positive for weakness (generalized). Negative for dizziness and headaches.  Psychiatric/Behavioral: Negative.  Negative for depression.     MEDICATIONS AT HOME:   Prior to Admission medications   Medication Sig Start Date End Date Taking? Authorizing Provider  Ascorbic Acid (VITAMIN C) 1000 MG tablet Take 1,000 mg by mouth daily.   Yes [provider]  atorvastatin (LIPITOR) 80 MG tablet Take 80 mg by mouth daily.    Yes [provider]  benazepril (LOTENSIN) 40 MG tablet Take 40 mg by mouth 2 (two) times daily.    Yes [provider]  Cyanocobalamin (VITAMIN B-12 PO) Take by mouth.   Yes [provider]  insulin aspart (NOVOLOG FLEXPEN) 100 UNIT/ML FlexPen Inject 7-9 Units into the skin 3 (three) times daily with meals.  12/14/15  Yes [provider]  Insulin Degludec (TRESIBA FLEXTOUCH) 200 UNIT/ML SOPN Inject 22 Units into the skin daily.  04/03/16  Yes [provider]  liraglutide (VICTOZA) 18 MG/3ML SOPN Victoza 3-Pak 0.6 mg/0.1 mL (18 mg/3 mL) subcutaneous pen injector 09/13/15  Yes [provider]  loratadine (CLARITIN) 10 MG tablet Take 10 mg by mouth daily as needed for allergies.   Yes [provider]  losartan (COZAAR) 50 MG tablet Take 50 mg by mouth daily.   Yes [provider]  propranolol (INDERAL) 20 MG tablet Take 60 mg by mouth daily.    Yes [provider]  pyridOXINE (VITAMIN B-6) 100 MG tablet Take 100 mg by mouth daily.   Yes [provider]  solifenacin (VESICARE) 10 MG tablet Take 1 tablet (10 mg total) by mouth daily. 10/11/18  Yes Stoioff, Ronda Fairly, MD      VITAL SIGNS:  Blood pressure (!) 186/80, pulse 90, temperature 98.4 F (36.9 C), temperature source Oral, resp. rate 18, height 5\' 1"  (1.549 m), weight 86.2 kg, SpO2 98 %.   PHYSICAL EXAMINATION:  Physical Exam  GENERAL: Weak appearing  78 y.o.-year-old patient lying in the bed with no acute distress.  EYES: Pupils equal, round, reactive to light and accommodation. No scleral icterus. Extraocular muscles intact.  HEENT: Head atraumatic, normocephalic. Oropharynx and nasopharynx clear.  NECK:  Supple, no jugular venous distention. No thyroid enlargement, no tenderness.  LUNGS: Normal breath sounds bilaterally, no wheezing, rales,rhonchi or crepitation. No use of accessory muscles of respiration.  CARDIOVASCULAR: Regular rate and rhythm, S1, S2 normal. No murmurs, rubs, or gallops.  ABDOMEN: Soft, nondistended, nontender. Bowel sounds present. No organomegaly or mass.  EXTREMITIES: No pedal edema, cyanosis, or clubbing.  NEUROLOGIC: Cranial nerves II through XII are intact.Uncontrolled left facial twitching. Muscle strength 3/5 in all extremities. Sensation intact. Gait not checked.  PSYCHIATRIC: The patient is alert and oriented x 3.  Normal affect  and good eye contact. SKIN: No obvious rash, lesion, or ulcer.   LABORATORY PANEL:   CBC Recent Labs  Lab 03/27/19 1804  WBC 10.1  HGB 13.2  HCT 37.7  PLT 390   ------------------------------------------------------------------------------------------------------------------  Chemistries  Recent Labs  Lab 03/27/19 1804  NA 134*  K 4.1  CL 98  CO2 24  GLUCOSE 371*  BUN 40*  CREATININE 2.70*  CALCIUM 9.3  AST 17  ALT 14  ALKPHOS 79  BILITOT 1.1   ------------------------------------------------------------------------------------------------------------------  Cardiac Enzymes No results for input(s): TROPONINI in the last 168 hours. ------------------------------------------------------------------------------------------------------------------  RADIOLOGY:  Ct Head Wo Contrast  Result Date: 03/27/2019 CLINICAL DATA:  78 year old female with acute weakness. EXAM: CT HEAD WITHOUT CONTRAST  TECHNIQUE: Contiguous axial images were obtained from the base of the skull through the vertex without intravenous contrast. COMPARISON:  12/18/2018 CT and prior studies FINDINGS: Brain: No evidence of acute infarction, hemorrhage, hydrocephalus, extra-axial collection or mass lesion/mass effect. Atrophy and chronic small-vessel white matter ischemic changes again noted. Vascular: Mild carotid atherosclerotic calcifications again noted. Skull: Normal. Negative for fracture or focal lesion. Sinuses/Orbits: No acute finding. Other: None. IMPRESSION: 1. No evidence of acute intracranial abnormality. 2. Atrophy and chronic small-vessel white matter ischemic changes. Electronically Signed   By: Margarette Canada M.D.   On: 03/27/2019 19:54   Dg Chest Portable 1 View  Result Date: 03/27/2019 CLINICAL DATA:  Weakness for several days. EXAM: PORTABLE CHEST 1 VIEW COMPARISON:  PA and lateral chest 12/19/2018 and full 12/19/2015. FINDINGS: Lungs are clear. There is cardiomegaly. No pneumothorax or pleural fluid. No acute or focal bony abnormality. IMPRESSION: Cardiomegaly without acute disease. Electronically Signed   By: Inge Rise M.D.   On: 03/27/2019 18:14      IMPRESSION AND PLAN:   1.  Generalized weakness - Physical therapy consulted for supportive care - We will consult social services for possible rehab placement at the time of discharge  2.  Elevated troponin - We will continue to trend troponin levels - Telemetry monitoring -Repeat EKG in the a.m. -She has normal saline infusing to a peripheral IV 100 cc/h -Repeat CBC and BMP in the a.m. -We will get echocardiogram given patient's report of increased weakness as well.  3.  Acute on chronic renal failure - Likely secondary to mild dehydration.  Patient is receiving fluid resuscitation currently with normal saline infusing at 100 cc/h.  We will repeat BMP and continue to monitor renal function closely  4.  Diabetes mellitus - Moderate  sliding scale insulin  5.  Hyperlipidemia - Statin therapy continued -Lipid panel pending  DVT and PPI prophylaxis initiated    All the records are reviewed and case discussed with ED provider. The plan of care was discussed in details with the patient (and family). I answered all questions. The patient agreed to proceed with the above mentioned plan. Further management will depend upon hospital course.   CODE STATUS: Full code  TOTAL TIME TAKING CARE OF THIS PATIENT:45 minutes.    [place] on 03/28/2019 at 3:51 AM  Pager - [phone]  After 6pm go to [email or web address] - Proofreader  [facility] [place] [facility]  Office  [phone]  CC: Primary care physician; Dion Body, MD   Note: This dictation was prepared with Dragon dictation along with smaller phrase technology. Any transcriptional errors that result from this process are unintentional.   This is an 78 year old female admitted for generalized weakness.  Found to have hypertensive  urgency.  Some mental status change as well as aphasia noted by nursing.  CT of the head was negative but I have ordered an MRI.  Also obtain swallow evaluation.  We will not attempt to lower pressure any further.  Also discontinue lisinopril and losartan due to kidney function.  Chart reviewed and discussed with provider. Agree with treatment and diagnosis

## 2019-03-28 NOTE — Progress Notes (Signed)
SLP Cancellation Note  Patient Details Name: Virginie Josten MRN: [id number] DOB: 07/25/1941   Cancelled treatment:       Reason Eval/Treat Not Completed: Patient at procedure or test/unavailable  Leroy Sea, MS/CCC- SLP  Lou Miner 03/28/2019, 9:18 AM

## 2019-03-28 NOTE — Progress Notes (Signed)
PT Cancellation Note  Patient Details Name: Holly Conner MRN: [id number] DOB: Sep 17, 1940   Cancelled Treatment:    Reason Eval/Treat Not Completed: Medical issues which prohibited therapy.  Per chart review pt recently back to room after imaging (MRI & Carotid US). Awaiting results. Pt also noted to have most recent blood glucose reading of 361 which is outside of the parameters recommended for therapy. Pt is currently NPO awaiting SLP evaluation for diet recommendations and to receive oral medications. Will follow acutely and re-attempt once pt medically appropriate for PT evaluation and care plan in place.   Linus Salmons PT, DPT 03/28/19, 11:12 AM

## 2019-03-28 NOTE — Progress Notes (Signed)
OT Cancellation Note  Patient Details Name: Holly Conner MRN: [id number] DOB: 11/11/1940   Cancelled Treatment:    Reason Eval/Treat Not Completed: Patient not medically ready. Thank you for the OT consult. Order received and chart reviewed. Per RN pt, recently back to room after imaging (MRI & Carotid US). Awaiting results. Pt also noted to have most recent blood glucose reading of 361 which is outside of the parameters recommended for therapy. Pt is currently NPO awaiting SLP evaluation for diet recommendations and to receive oral medications. Will follow acutely and re-attempt once pt medically appropriate for OT evaluation and care plan in place.  Shara Blazing, M.S., OTR/L Ascom: [phone] 03/28/19, 9:40 AM

## 2019-03-28 NOTE — Progress Notes (Signed)
Called and spoke to [facility] to get more information concerning baseline of patient.   Holly Conner [facility]) reports patient resides in home with [name]'s Mother but she has no in home services and really struggling with caring for herself with ADL's. POA said she has attempted to get patient in to an assisted living but has been unsuccessful. Pnt reports she has been having trouble swallowing which was also confirmed by POA. Notified MD to get speech evaluation before taking PO meds or food, order in place for NPO. Blood pressure has been managed with IV BP meds and nitro paste at this time.  Informed Holly Conner that pnt is displaying excessive aphasia, not ID'ing objects or answering questions appropriately, alert to self only at this time which POA confirms is not baseline however confirmed yesterday afternoon 7-30 patient did become non-verbal before they called EMS, when EMS arrived pnt was responding but blood pressure was still an issue. 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   Dr. Marcille Blanco was notified of neurological assessment who then put in orders for MRI, Carotid, Echo, and neuro assessments. No family involved in care, only POA and housemate.

## 2019-03-28 NOTE — Progress Notes (Addendum)
[place] at [place] NAME: Holly Conner    MR#:  [id number]  DATE OF BIRTH:  02-11-1941  SUBJECTIVE:  CHIEF COMPLAINT:   Chief Complaint  Patient presents with  . Fatigue  . Failure To Thrive  Patient seen and evaluated today Awake and responds to all verbal commands Moves all extremities No slurred speech No tingling or numbness Has generalized weakness Initial CT head no acute abnormality  REVIEW OF SYSTEMS:    ROS  CONSTITUTIONAL: No documented fever. Has fatigue, weakness. No weight gain, no weight loss.  EYES: No blurry or double vision.  ENT: No tinnitus. No postnasal drip. No redness of the oropharynx.  RESPIRATORY: No cough, no wheeze, no hemoptysis. No dyspnea.  CARDIOVASCULAR: No chest pain. No orthopnea. No palpitations. No syncope.  GASTROINTESTINAL: No nausea, no vomiting or diarrhea. No abdominal pain. No melena or hematochezia.  GENITOURINARY: No dysuria or hematuria.  ENDOCRINE: No polyuria or nocturia. No heat or cold intolerance.  HEMATOLOGY: No anemia. No bruising. No bleeding.  INTEGUMENTARY: No rashes. No lesions.  MUSCULOSKELETAL: No arthritis. No swelling. No gout.  NEUROLOGIC: No numbness, tingling, or ataxia. No seizure-type activity.  PSYCHIATRIC: No anxiety. No insomnia. No ADD.   DRUG ALLERGIES:   Allergies  Allergen Reactions  . Iodinated Diagnostic Agents Shortness Of Breath    Only with nuclear stress test per pt Only with nuclear stress test per pt   . Penicillins Rash    Rash at injection site. Pt states she can take Amoxicillin & Keflex   . Escitalopram   . Lithium   . Erythromycin Nausea Only and Nausea And Vomiting    Other reaction(s): Other (See Comments) dehydration     VITALS:  Blood pressure (!) 130/52, pulse 62, temperature 98.1 F (36.7 C), temperature source Oral, resp. rate 20, height 5\' 1"  (1.549 m), weight 86.2 kg, SpO2 99 %.  PHYSICAL EXAMINATION:   Physical  Exam  GENERAL:  78 y.o.-year-old patient lying in the bed with no acute distress.  EYES: Pupils equal, round, reactive to light and accommodation. No scleral icterus. Extraocular muscles intact.  HEENT: Head atraumatic, normocephalic. Oropharynx dry and nasopharynx clear.  NECK:  Supple, no jugular venous distention. No thyroid enlargement, no tenderness.  LUNGS: Normal breath sounds bilaterally, no wheezing, rales, rhonchi. No use of accessory muscles of respiration.  CARDIOVASCULAR: S1, S2 normal. No murmurs, rubs, or gallops.  ABDOMEN: Soft, nontender, nondistended. Bowel sounds present. No organomegaly or mass.  EXTREMITIES: No cyanosis, clubbing or edema b/l.    NEUROLOGIC: Cranial nerves II through XII are intact. No focal Motor or sensory deficits b/l.   PSYCHIATRIC: The patient is alert and oriented x 3.  SKIN: No obvious rash, lesion, or ulcer.   LABORATORY PANEL:   CBC Recent Labs  Lab 03/28/19 0638  WBC 10.5  HGB 13.1  HCT 37.8  PLT 399   ------------------------------------------------------------------------------------------------------------------ Chemistries  Recent Labs  Lab 03/27/19 1804 03/28/19 0638  NA 134* 137  K 4.1 4.0  CL 98 101  CO2 24 21*  GLUCOSE 371* 368*  BUN 40* 38*  CREATININE 2.70* 2.67*  CALCIUM 9.3 9.4  AST 17  --   ALT 14  --   ALKPHOS 79  --   BILITOT 1.1  --    ------------------------------------------------------------------------------------------------------------------  Cardiac Enzymes No results for input(s): TROPONINI in the last 168 hours. ------------------------------------------------------------------------------------------------------------------  RADIOLOGY:  Ct Head Wo Contrast  Result Date: 03/27/2019 CLINICAL DATA:  78 year old  female with acute weakness. EXAM: CT HEAD WITHOUT CONTRAST TECHNIQUE: Contiguous axial images were obtained from the base of the skull through the vertex without intravenous contrast.  COMPARISON:  12/18/2018 CT and prior studies FINDINGS: Brain: No evidence of acute infarction, hemorrhage, hydrocephalus, extra-axial collection or mass lesion/mass effect. Atrophy and chronic small-vessel white matter ischemic changes again noted. Vascular: Mild carotid atherosclerotic calcifications again noted. Skull: Normal. Negative for fracture or focal lesion. Sinuses/Orbits: No acute finding. Other: None. IMPRESSION: 1. No evidence of acute intracranial abnormality. 2. Atrophy and chronic small-vessel white matter ischemic changes. Electronically Signed   By: Margarette Canada M.D.   On: 03/27/2019 19:54   US Carotid Bilateral (at [facility] And Ap Only)  Result Date: 03/28/2019 CLINICAL DATA:  78 year old female with aphasia EXAM: BILATERAL CAROTID DUPLEX ULTRASOUND TECHNIQUE: Pearline Cables scale imaging, color Doppler and duplex ultrasound were performed of bilateral carotid and vertebral arteries in the neck. COMPARISON:  Prior duplex carotid ultrasound 12/20/2015 and 10/06/2010 FINDINGS: Criteria: Quantification of carotid stenosis is based on velocity parameters that correlate the residual internal carotid diameter with NASCET-based stenosis levels, using the diameter of the distal internal carotid lumen as the denominator for stenosis measurement. The following velocity measurements were obtained: RIGHT ICA: 81/22 cm/sec CCA: 95/6 cm/sec SYSTOLIC ICA/CCA RATIO:  0.9 ECA:  150 cm/sec LEFT ICA: 78/22 cm/sec CCA: 21/30 cm/sec SYSTOLIC ICA/CCA RATIO:  1.0 ECA:  126 cm/sec RIGHT CAROTID ARTERY: Stable mild heterogeneous atherosclerotic plaque in the proximal internal carotid artery. By peak systolic velocity criteria, the estimated stenosis remains less than 50%. RIGHT VERTEBRAL ARTERY:  Patent with normal antegrade flow. LEFT CAROTID ARTERY: Mild focal heterogeneous atherosclerotic plaque in the proximal internal carotid artery. By peak systolic velocity criteria, the estimated stenosis remains less than 50%. LEFT  VERTEBRAL ARTERY:  Patent with normal antegrade flow. IMPRESSION: 1. No significant interval progression of mild bilateral internal carotid artery disease compared to 12/20/2015. 2. Mild (1-49%) stenosis proximal right internal carotid artery secondary to focal heterogeneous atherosclerotic plaque. 3. Mild (1-49%) stenosis proximal left internal carotid artery secondary to focal heterogeneous atherosclerotic plaque. 4. Vertebral arteries remain patent with normal antegrade flow. Signed, Criselda Peaches, MD, [place] [facility] Radiology Electronically Signed   By: Jacqulynn Cadet M.D.   On: 03/28/2019 09:57   Dg Chest Portable 1 View  Result Date: 03/27/2019 CLINICAL DATA:  Weakness for several days. EXAM: PORTABLE CHEST 1 VIEW COMPARISON:  PA and lateral chest 12/19/2018 and full 12/19/2015. FINDINGS: Lungs are clear. There is cardiomegaly. No pneumothorax or pleural fluid. No acute or focal bony abnormality. IMPRESSION: Cardiomegaly without acute disease. Electronically Signed   By: Inge Rise M.D.   On: 03/27/2019 18:14     ASSESSMENT AND PLAN:   78 year old elderly female patient is with history of CVA, hypertension, hyperlipidemia, type 2 diabetes mellitus, GERD, arthritis, chronic kidney disease stage III currently in the hospital pedal under hospitalist service for generalized weakness  -Acute on chronic kidney disease stage III Secondary to dehydration IV fluids Monitor renal function  -Embolic CVA Appears ischemic Start aspirin Continue statin medication Neurology consult Echocardiogram Cardiology consult for transesophageal echo Neurovascular check  -Uncontrolled diabetes mellitus Diabetic diet Lantus insulin with sliding scale coverage with insulin Monitor blood sugars closely  -Hyperlipidemia Continue statin medication  -History of CVA Carotid ultrasound done does not reveal any significant stenosis Pending MRI  brain to assess for any new CVA  -DVT prophylaxis subcu heparin  -Dehydration IV fluids  -Ambulatory  dysfunction PT evaluation  All the records are reviewed and case discussed with Care Management/Social Worker. Management plans discussed with the patient, family and they are in agreement.  CODE STATUS: Full code  DVT Prophylaxis: SCDs  TOTAL TIME TAKING CARE OF THIS PATIENT: 37 minutes.   POSSIBLE D/C IN 1 to 2 DAYS, DEPENDING ON CLINICAL CONDITION.  Saundra Shelling M.D on 03/28/2019 at 12:15 PM  Between 7am to 6pm - Pager - [phone]  After 6pm go to [email or web address] - password [email or web address] [place] [facility]  Office  [phone]  CC: Primary care physician; Dion Body, MD  Note: This dictation was prepared with Dragon dictation along with smaller phrase technology. Any transcriptional errors that result from this process are unintentional.

## 2019-03-28 NOTE — Progress Notes (Signed)
Patent was assessed to DC 1:1  Sitter. Tele monitoring was iniated but after the patient return from MRI she was impulsive and and attempting to get out of bed. 1:1 sitter was placed back at the bedside.

## 2019-03-28 NOTE — Clinical Social Work Note (Signed)
CSW was informed by bedside nurse that patient's Holly Conner which is patient's niece is concerned that patient may need a higher level of care.  Patient's niece expressed that patient is unable to take her medications and unable to complete her ADLs on her own.  Patient's niece also stated she does not know where to start, CSW informed bedside nurse that patient's niece can call APS.  This CSW will ask weekend CSW to follow up with patient's niece and discuss options.  Holly Conner. [place], MSW, [place]  03/28/2019 7:07 PM

## 2019-03-29 ENCOUNTER — Inpatient Hospital Stay
Admit: 2019-03-29 | Discharge: 2019-03-29 | Disposition: A | Discharge status: Home / Self Care | Payer: Medicare Other | Attending: Internal Medicine | Admitting: Internal Medicine

## 2019-03-29 ENCOUNTER — Inpatient Hospital Stay: Payer: Medicare Other

## 2019-03-29 DIAGNOSIS — R531 Weakness: Secondary | ICD-10-CM

## 2019-03-29 LAB — CBC
HCT: 33 % — ABNORMAL LOW (ref 36.0–46.0)
Hemoglobin: 11.3 g/dL — ABNORMAL LOW (ref 12.0–15.0)
MCH: 29.4 pg (ref 26.0–34.0)
MCHC: 34.2 g/dL (ref 30.0–36.0)
MCV: 85.9 fL (ref 80.0–100.0)
Platelets: 315 10*3/uL (ref 150–400)
RBC: 3.84 MIL/uL — ABNORMAL LOW (ref 3.87–5.11)
RDW: 13.2 % (ref 11.5–15.5)
WBC: 8.8 10*3/uL (ref 4.0–10.5)
nRBC: 0 % (ref 0.0–0.2)

## 2019-03-29 LAB — BASIC METABOLIC PANEL
Anion gap: 9 (ref 5–15)
BUN: 39 mg/dL — ABNORMAL HIGH (ref 8–23)
CO2: 24 mmol/L (ref 22–32)
Calcium: 8.5 mg/dL — ABNORMAL LOW (ref 8.9–10.3)
Chloride: 106 mmol/L (ref 98–111)
Creatinine, Ser: 2.44 mg/dL — ABNORMAL HIGH (ref 0.44–1.00)
GFR calc Af Amer: 21 mL/min — ABNORMAL LOW (ref >60)
GFR calc non Af Amer: 18 mL/min — ABNORMAL LOW (ref >60)
Glucose, Bld: 291 mg/dL — ABNORMAL HIGH (ref 70–99)
Potassium: 3.4 mmol/L — ABNORMAL LOW (ref 3.5–5.1)
Sodium: 139 mmol/L (ref 135–145)

## 2019-03-29 LAB — GLUCOSE, CAPILLARY
Glucose-Capillary: 265 mg/dL — ABNORMAL HIGH (ref 70–99)
Glucose-Capillary: 403 mg/dL — ABNORMAL HIGH (ref 70–99)
Glucose-Capillary: 281 mg/dL — ABNORMAL HIGH (ref 70–99)
Glucose-Capillary: 271 mg/dL — ABNORMAL HIGH (ref 70–99)

## 2019-03-29 LAB — HEMOGLOBIN A1C
Hgb A1c MFr Bld: 6.9 % — ABNORMAL HIGH (ref 4.8–5.6)
Mean Plasma Glucose: 151.33 mg/dL

## 2019-03-29 MED ORDER — INSULIN GLARGINE 100 UNIT/ML ~~LOC~~ SOLN
30.0000 [IU] | Freq: Every day | SUBCUTANEOUS | Status: DC
  Administered 2019-03-30 – 2019-04-01 (×3): 30 [IU] via SUBCUTANEOUS
  Filled 2019-03-29 (×4): qty 0.3

## 2019-03-29 NOTE — Progress Notes (Signed)
[place] at [place] NAME: Holly Conner    MR#:  [id number]  DATE OF BIRTH:  [birth year]  SUBJECTIVE:  CHIEF COMPLAINT:   Chief Complaint  Patient presents with  . Fatigue  . Failure To Thrive  Patient seen and evaluated today Awake and responds to all verbal commands Moves all extremities No slurred speech No tingling or numbness Has generalized weakness  REVIEW OF SYSTEMS:    ROS  CONSTITUTIONAL: No documented fever. Has fatigue, weakness. No weight gain, no weight loss.  EYES: No blurry or double vision.  ENT: No tinnitus. No postnasal drip. No redness of the oropharynx.  RESPIRATORY: No cough, no wheeze, no hemoptysis. No dyspnea.  CARDIOVASCULAR: No chest pain. No orthopnea. No palpitations. No syncope.  GASTROINTESTINAL: No nausea, no vomiting or diarrhea. No abdominal pain. No melena or hematochezia.  GENITOURINARY: No dysuria or hematuria.  ENDOCRINE: No polyuria or nocturia. No heat or cold intolerance.  HEMATOLOGY: No anemia. No bruising. No bleeding.  INTEGUMENTARY: No rashes. No lesions.  MUSCULOSKELETAL: No arthritis. No swelling. No gout.  NEUROLOGIC: No numbness, tingling, or ataxia. No seizure-type activity.  PSYCHIATRIC: No anxiety. No insomnia. No ADD.   DRUG ALLERGIES:   Allergies  Allergen Reactions  . Iodinated Diagnostic Agents Shortness Of Breath    Only with nuclear stress test per pt Only with nuclear stress test per pt   . Penicillins Rash    Rash at injection site. Pt states she can take Amoxicillin & Keflex   . Escitalopram   . Lithium   . Erythromycin Nausea Only and Nausea And Vomiting    Other reaction(s): Other (See Comments) dehydration     VITALS:  Blood pressure (!) 210/68, pulse 62, temperature 98.1 F (36.7 C), temperature source Oral, resp. rate 19, height 5\' 1"  (1.549 m), weight 86.2 kg, SpO2 97 %.  PHYSICAL EXAMINATION:   Physical Exam  GENERAL:  78 y.o.-year-old  patient lying in the bed with no acute distress.  EYES: Pupils equal, round, reactive to light and accommodation. No scleral icterus. Extraocular muscles intact.  HEENT: Head atraumatic, normocephalic. Oropharynx dry and nasopharynx clear.  NECK:  Supple, no jugular venous distention. No thyroid enlargement, no tenderness.  LUNGS: Normal breath sounds bilaterally, no wheezing, rales, rhonchi. No use of accessory muscles of respiration.  CARDIOVASCULAR: S1, S2 normal. No murmurs, rubs, or gallops.  ABDOMEN: Soft, nontender, nondistended. Bowel sounds present. No organomegaly or mass.  EXTREMITIES: No cyanosis, clubbing or edema b/l.    NEUROLOGIC: Cranial nerves II through XII are intact. No focal Motor or sensory deficits b/l.   PSYCHIATRIC: The patient is alert and oriented x 3.  SKIN: No obvious rash, lesion, or ulcer.   LABORATORY PANEL:   CBC Recent Labs  Lab 03/29/19 0540  WBC 8.8  HGB 11.3*  HCT 33.0*  PLT 315   ------------------------------------------------------------------------------------------------------------------ Chemistries  Recent Labs  Lab 03/27/19 1804  03/29/19 0540  NA 134*   < > 139  K 4.1   < > 3.4*  CL 98   < > 106  CO2 24   < > 24  GLUCOSE 371*   < > 291*  BUN 40*   < > 39*  CREATININE 2.70*   < > 2.44*  CALCIUM 9.3   < > 8.5*  AST 17  --   --   ALT 14  --   --   ALKPHOS 79  --   --   BILITOT  1.1  --   --    < > = values in this interval not displayed.   ------------------------------------------------------------------------------------------------------------------  Cardiac Enzymes No results for input(s): TROPONINI in the last 168 hours. ------------------------------------------------------------------------------------------------------------------  RADIOLOGY:  Ct Head Wo Contrast  Result Date: 03/27/2019 CLINICAL DATA:  78 year old female with acute weakness. EXAM: CT HEAD WITHOUT CONTRAST TECHNIQUE: Contiguous axial images were  obtained from the base of the skull through the vertex without intravenous contrast. COMPARISON:  12/18/2018 CT and prior studies FINDINGS: Brain: No evidence of acute infarction, hemorrhage, hydrocephalus, extra-axial collection or mass lesion/mass effect. Atrophy and chronic small-vessel white matter ischemic changes again noted. Vascular: Mild carotid atherosclerotic calcifications again noted. Skull: Normal. Negative for fracture or focal lesion. Sinuses/Orbits: No acute finding. Other: None. IMPRESSION: 1. No evidence of acute intracranial abnormality. 2. Atrophy and chronic small-vessel white matter ischemic changes. Electronically Signed   By: Margarette Canada M.D.   On: 03/27/2019 19:54   Mr Brain Wo Contrast  Result Date: 03/28/2019 CLINICAL DATA:  Focal neuro deficit. Increasing fatigue and weakness 4 days. EXAM: MRI HEAD WITHOUT CONTRAST TECHNIQUE: Multiplanar, multiecho pulse sequences of the brain and surrounding structures were obtained without intravenous contrast. COMPARISON:  MRI head 12/20/2015 FINDINGS: Brain: Multiple small areas of acute infarct involving the left posterior temporal lobe and left inferior occipital parietal white matter. Small acute infarct left putamen and right internal capsule anteriorly. Small acute infarct in the periventricular white matter of the right frontal and right parietal lobe. Small acute infarct left parietal white matter. Generalized atrophy, moderate. Mild chronic microvascular ischemic change in the white matter. Chronic microhemorrhage in the thalamus bilaterally, not seen previously. T1 hyperintensity left putamen not seen previously and likely due to mineralization Vascular: Normal arterial flow voids Skull and upper cervical spine: Negative Sinuses/Orbits: Negative Other: None IMPRESSION: Multiple scattered small areas of infarct in both cerebral hemispheres. Possible emboli. Possible hypercoagulability. Microhemorrhage in the thalamus bilaterally peers  chronic but not present in 2017. Atrophy and chronic ischemic changes in the white matter. Electronically Signed   By: Franchot Gallo M.D.   On: 03/28/2019 13:07   US Carotid Bilateral (at [facility] And Ap Only)  Result Date: 03/28/2019 CLINICAL DATA:  78 year old female with aphasia EXAM: BILATERAL CAROTID DUPLEX ULTRASOUND TECHNIQUE: Pearline Cables scale imaging, color Doppler and duplex ultrasound were performed of bilateral carotid and vertebral arteries in the neck. COMPARISON:  Prior duplex carotid ultrasound 12/20/2015 and 10/06/2010 FINDINGS: Criteria: Quantification of carotid stenosis is based on velocity parameters that correlate the residual internal carotid diameter with NASCET-based stenosis levels, using the diameter of the distal internal carotid lumen as the denominator for stenosis measurement. The following velocity measurements were obtained: RIGHT ICA: 81/22 cm/sec CCA: 29/5 cm/sec SYSTOLIC ICA/CCA RATIO:  0.9 ECA:  150 cm/sec LEFT ICA: 78/22 cm/sec CCA: 28/41 cm/sec SYSTOLIC ICA/CCA RATIO:  1.0 ECA:  126 cm/sec RIGHT CAROTID ARTERY: Stable mild heterogeneous atherosclerotic plaque in the proximal internal carotid artery. By peak systolic velocity criteria, the estimated stenosis remains less than 50%. RIGHT VERTEBRAL ARTERY:  Patent with normal antegrade flow. LEFT CAROTID ARTERY: Mild focal heterogeneous atherosclerotic plaque in the proximal internal carotid artery. By peak systolic velocity criteria, the estimated stenosis remains less than 50%. LEFT VERTEBRAL ARTERY:  Patent with normal antegrade flow. IMPRESSION: 1. No significant interval progression of mild bilateral internal carotid artery disease compared to 12/20/2015. 2. Mild (1-49%) stenosis proximal right internal carotid artery secondary to focal heterogeneous atherosclerotic plaque. 3. Mild (1-49%) stenosis proximal left  internal carotid artery secondary to focal heterogeneous atherosclerotic plaque. 4. Vertebral arteries remain patent with  normal antegrade flow. Signed, Criselda Peaches, MD, [place] [facility] Radiology Electronically Signed   By: Jacqulynn Cadet M.D.   On: 03/28/2019 09:57   Dg Chest Portable 1 View  Result Date: 03/27/2019 CLINICAL DATA:  Weakness for several days. EXAM: PORTABLE CHEST 1 VIEW COMPARISON:  PA and lateral chest 12/19/2018 and full 12/19/2015. FINDINGS: Lungs are clear. There is cardiomegaly. No pneumothorax or pleural fluid. No acute or focal bony abnormality. IMPRESSION: Cardiomegaly without acute disease. Electronically Signed   By: Inge Rise M.D.   On: 03/27/2019 18:14     ASSESSMENT AND PLAN:   78 year old elderly female patient is with history of CVA, hypertension, hyperlipidemia, type 2 diabetes mellitus, GERD, arthritis, chronic kidney disease stage III currently in the hospital pedal under hospitalist service for generalized weakness  -Acute on chronic kidney disease stage III Secondary to dehydration IV fluids Monitor renal function  -Embolic CVA Start aspirin Continue statin medication Neurology consult Echocardiogram awaited. Cardiology consult for transesophageal echo MRA brain done.Awaited results.  -Uncontrolled diabetes mellitus Diabetic diet Lantus insulin with sliding scale coverage with insulin Monitor blood sugars closely  -Hyperlipidemia Continue statin medication  -DVT prophylaxis subcu heparin  -Dehydration IV fluids  -Ambulatory dysfunction PT evaluation  All the records are reviewed and case discussed with Care Management/Social Worker. Management plans discussed with the patient, family and they are in agreement.  CODE STATUS: Full code  DVT Prophylaxis: SCDs  TOTAL TIME TAKING CARE OF THIS PATIENT: 37 minutes.   POSSIBLE D/C IN 1 to 2 DAYS, DEPENDING ON CLINICAL CONDITION.  Vaughan Basta M.D on 03/29/2019 at 5:19 PM  Between 7am to 6pm - Pager - [phone]  After 6pm go  to [email or web address] - password [email or web address] [place] [facility]  Office  [phone]  CC: Primary care physician; Dion Body, MD  Note: This dictation was prepared with Dragon dictation along with smaller phrase technology. Any transcriptional errors that result from this process are unintentional.

## 2019-03-29 NOTE — Evaluation (Signed)
Physical Therapy Evaluation Patient Details Name: Holly Conner MRN: [id number] DOB: November 15, 1940 Today's Date: 03/29/2019   History of Present Illness  From MD progress note 03/28/19:  Pt is an 78 year old female with history of CVA, hypertension, hyperlipidemia, type 2 diabetes mellitus, GERD, arthritis, chronic kidney disease stage III currently in the hospital under hospitalist service for generalized weakness.  Assessment includes: Acute on chronic kidney disease stage III, embolic CVA, DM, HLD, h/o CVA, dehydration, and ambulatory dysfunction.    Clinical Impression  Pt presents with min deficits in strength, transfers, mobility, gait, balance, and activity tolerance.  Pt was diagnosed with new embolic CVA but performed at a high enough level functionally that a frequency of 2x/wk is appropriate at this time.  Per below pt presented with min deficits in RUE strength and coordination compared to the LUE with sensation to light touch and proprioception intact to both UEs.  No asymmetries noted to BLE strength with BLE sensation and coordination WNL.  Pt was Mod Ind with bed mobility tasks and CGA with transfers demonstrating fair to good eccentric and concentric strength.  Pt was able to amb 2 x 40' with a RW with slow cadence and short B step length with mod verbal cues for amb closer to the RW but was steady without LOB.  Pt owns a RW at home but uses a SPC secondary to her RW being in need of rear leg sliders/tennis balls.  Educated pt that with her reported history of falling that the RW would be more appropriate at this time with pt in agreement.  Pt will benefit from HHPT services upon discharge to safely address above deficits for decreased caregiver assistance and eventual return to PLOF.      Follow Up Recommendations Home health PT;Supervision for mobility/OOB    Equipment Recommendations  Other (comment)(Pt owns a FWW but needs rear leg sliders in order to use it)    Recommendations  for Other Services       Precautions / Restrictions Precautions Precautions: Fall Restrictions Weight Bearing Restrictions: No      Mobility  Bed Mobility Overal bed mobility: Modified Independent             General bed mobility comments: Min increased time and effort with bed mobility tasks but no physical assistance required  Transfers Overall transfer level: Needs assistance Equipment used: Rolling walker (2 wheeled) Transfers: Sit to/from Stand Sit to Stand: Min guard         General transfer comment: Mod verbal cues for hand placement during transfers  Ambulation/Gait Ambulation/Gait assistance: Min guard Gait Distance (Feet): 40 Feet x 2 Assistive device: Rolling walker (2 wheeled) Gait Pattern/deviations: Step-through pattern;Decreased step length - right;Decreased step length - left;Trunk flexed Gait velocity: decreased   General Gait Details: Mod verbal cues for amb closer to the RW especially during turns  Financial trader Rankin (Stroke Patients Only)       Balance Overall balance assessment: Needs assistance Sitting-balance support: No upper extremity supported Sitting balance-Leahy Scale: Good     Standing balance support: Bilateral upper extremity supported Standing balance-Leahy Scale: Good                               Pertinent Vitals/Pain Pain Assessment: No/denies pain    Home Living Family/patient expects to be discharged to:: Private residence Living Arrangements:  Non-relatives/Friends Available Help at Discharge: Friend(s);Available PRN/intermittently Type of Home: House Home Access: Stairs to enter Entrance Stairs-Rails: None Entrance Stairs-Number of Steps: 2 Home Layout: One level Home Equipment: Walker - 2 wheels;Cane - single point Additional Comments: Pt reports that she does not use her RW because it needs tennis balls on the back legs    Prior Function Level of  Independence: Needs assistance   Gait / Transfers Assistance Needed: Pt amb with a SPC HH distances with assist from friend secondary to instability per pt; pt reports 3 falls in the last year; pt Ind with transfers and bed mobility; pt uses w/c for MD apts  ADL's / Homemaking Assistance Needed: Pt Ind with bathing and dressing        Hand Dominance   Dominant Hand: Right    Extremity/Trunk Assessment   Upper Extremity Assessment Upper Extremity Assessment: RUE deficits/detail;LUE deficits/detail RUE Deficits / Details: RUE strength grossly 3+/5 with decrease RAMs compared to the LUE RUE Sensation: WNL RUE Coordination: decreased gross motor LUE Deficits / Details: LUE strength grossly 4+/5 LUE Sensation: WNL LUE Coordination: WNL    Lower Extremity Assessment Lower Extremity Assessment: Generalized weakness;RLE deficits/detail;LLE deficits/detail RLE Deficits / Details: RLE strength grossly 4/5 RLE Sensation: WNL RLE Coordination: WNL LLE Deficits / Details: LLE strength grossly 4/5 LLE Sensation: WNL LLE Coordination: WNL       Communication   Communication: No difficulties  Cognition Arousal/Alertness: Awake/alert Behavior During Therapy: WFL for tasks assessed/performed Overall Cognitive Status: Within Functional Limits for tasks assessed                                        General Comments      Exercises Total Joint Exercises Ankle Circles/Pumps: Strengthening;Both;10 reps Quad Sets: Strengthening;Both;10 reps Gluteal Sets: Strengthening;Both;10 reps Long Arc Quad: Strengthening;Both;10 reps Knee Flexion: Strengthening;Both;10 reps Marching in Standing: AROM;Both;10 reps;Seated;Standing Other Exercises Other Exercises: HEP education/review for BLE APs, GS, GS, and LAQs x 10 each 5x/day   Assessment/Plan    PT Assessment Patient needs continued PT services  PT Problem List Decreased strength;Decreased balance;Decreased activity  tolerance;Decreased knowledge of use of DME       PT Treatment Interventions DME instruction;Gait training;Stair training;Functional mobility training;Therapeutic activities;Therapeutic exercise;Balance training;Patient/family education    PT Goals (Current goals can be found in the Care Plan section)  Acute Rehab PT Goals Patient Stated Goal: To walk better with better balance PT Goal Formulation: With patient Time For Goal Achievement: 04/11/19 Potential to Achieve Goals: Good    Frequency Min 2X/week   Barriers to discharge        Co-evaluation               AM-PAC PT "6 Clicks" Mobility  Outcome Measure Help needed turning from your back to your side while in a flat bed without using bedrails?: A Little Help needed moving from lying on your back to sitting on the side of a flat bed without using bedrails?: A Little Help needed moving to and from a bed to a chair (including a wheelchair)?: A Little Help needed standing up from a chair using your arms (e.g., wheelchair or bedside chair)?: A Little Help needed to walk in hospital room?: A Little Help needed climbing 3-5 steps with a railing? : A Little 6 Click Score: 18    End of Session Equipment Utilized During Treatment: Gait belt Activity  Tolerance: Patient tolerated treatment well Patient left: in chair;with call bell/phone within reach;with chair alarm set Nurse Communication: Mobility status PT Visit Diagnosis: Muscle weakness (generalized) (M62.81);Difficulty in walking, not elsewhere classified (R26.2);History of falling (Z91.81)    Time: 6016-5800 PT Time Calculation (min) (ACUTE ONLY): 39 min   Charges:   PT Evaluation $PT Eval Low Complexity: 1 Low PT Treatments $Therapeutic Exercise: 8-22 mins        D. Scott Darcia [place] PT, DPT 03/29/19, 11:43 AM

## 2019-03-29 NOTE — Progress Notes (Signed)
Pt's CBG is 403. MD notified. Orders to give maximum amount from SSI which is 15 units. I will continue to assess.

## 2019-03-29 NOTE — Evaluation (Signed)
Occupational Therapy Evaluation Patient Details Name: Holly Conner MRN: [id number] DOB: 1941-08-13 Today's Date: 03/29/2019    History of Present Illness From MD progress note 03/28/19:  Pt is an 78 year old female with history of CVA, hypertension, hyperlipidemia, type 2 diabetes mellitus, GERD, arthritis, chronic kidney disease stage III currently in the hospital under hospitalist service for generalized weakness.  Assessment includes: Acute on chronic kidney disease stage III, embolic CVA, DM, HLD, h/o CVA, dehydration, and ambulatory dysfunction.   Clinical Impression   Patient is a 78 year old woman who was seen for OT evaluation.  Session was limited due to patient having shakiness and feeling fatigued sitting at EOB after using BSC to urinate. She was able to complete be mobility and transition from lying to sitting with CGA and min cue.  Posture was forward and presents with overall weakness with R greater than L in UEs. Pt was diagnosed with new embolic CVA. Sensation to light touch and proprioception intact to both UEs. Full assessment of coordination was interrupted due to patient feeling shaky and cold and needed to lay down.  CNA checked her blood sugar which was 403 and therapist took her BP which was 172/56 and pulse 62.  Pt was feeling very tired as well and session was ended after bed alarm put back on and NSG was in room and given update.  Pt will benefit from [facility] services upon discharge to safely address above deficits for decreased caregiver assistance and eventual return to PLOF.    Follow Up Recommendations  Home health OT;Supervision - Intermittent    Equipment Recommendations       Recommendations for Other Services       Precautions / Restrictions Precautions Precautions: Fall Restrictions Weight Bearing Restrictions: No      Mobility Bed Mobility Overal bed mobility: Modified Independent             General bed mobility comments: Min increased time  and effort with bed mobility tasks but no physical assistance required  Transfers Overall transfer level: Needs assistance Equipment used: Rolling walker (2 wheeled) Transfers: Sit to/from Stand Sit to Stand: Min guard         General transfer comment: Mod verbal cues for hand placement during transfers    Balance Overall balance assessment: Needs assistance Sitting-balance support: No upper extremity supported Sitting balance-Leahy Scale: Good     Standing balance support: Bilateral upper extremity supported Standing balance-Leahy Scale: Good                             ADL either performed or assessed with clinical judgement   ADL Overall ADL's : Needs assistance/impaired Eating/Feeding: Independent;Set up;Bed level                                     General ADL Comments: Pt needed to use BSC to urinate and able to complete lying to sitting with supervision and then CGA with min cues for transfer to [facility].  She was able to complete hygiene after urinating without assist and CGA back to sitting at EOB again.  She was sitting on EOB for about 10 minutes to assess dressing skills and AROM of BUEs when she started to shake and became fatigued and needed to lay down.  Had CNA check her blood sugar which was 403 and took her BP and pulse and  checked O2 sats which were:  BP 172/56 and pulse 62 with sats 100%. Patient had difficutly stating the date and month and at [place] stated 2018 and then corrected herself and said 2020 but still did not know the month or time of year.  Pt left with NSG in room after updating her about BP and shaking and bed alarm on.     Vision Baseline Vision/History: Wears glasses Wears Glasses: Reading only Patient Visual Report: No change from baseline       Perception     Praxis      Pertinent Vitals/Pain Pain Assessment: No/denies pain     Hand Dominance Right   Extremity/Trunk Assessment Upper Extremity  Assessment Upper Extremity Assessment: Generalized weakness;RUE deficits/detail RUE Deficits / Details: Decreased strength on RUE which is her dominant hand and assessment limited due to pt feeling shaky sitting at EOB and needed to lay down suddenly and NSG alerted. RUE Sensation: WNL RUE Coordination: decreased gross motor LUE Deficits / Details: LUE strength grossly 4+/5 per PT assessment---unable to test due to pt not feeling well sitting up LUE Sensation: WNL LUE Coordination: WNL   Lower Extremity Assessment Lower Extremity Assessment: Defer to PT evaluation RLE Deficits / Details: RLE strength grossly 4/5 RLE Sensation: WNL RLE Coordination: WNL LLE Deficits / Details: LLE strength grossly 4/5 LLE Sensation: WNL LLE Coordination: WNL       Communication Communication Communication: No difficulties   Cognition Arousal/Alertness: Awake/alert(became lethargic and shaky after sitting up EOB after using BSC and CNA indicated BS was 403.  Therapist replaced BP cuff that was too small and BP was 172/56 and pulse 62.) Behavior During Therapy: Anxious;WFL for tasks assessed/performed Overall Cognitive Status: Within Functional Limits for tasks assessed                                 General Comments: Pt was anxious about not being home to pay a bill for $250 she reports is from a Romance scheme she was involved in [place].   General Comments       Exercises Total Joint Exercises Ankle Circles/Pumps: Strengthening;Both;10 reps Quad Sets: Strengthening;Both;10 reps Gluteal Sets: Strengthening;Both;10 reps Long Arc Quad: Strengthening;Both;10 reps Knee Flexion: Strengthening;Both;10 reps Marching in Standing: AROM;Both;10 reps;Seated;Standing Other Exercises Other Exercises: HEP education/review for BLE APs, GS, GS, and LAQs x 10 each 5x/day   Shoulder Instructions      Home Living Family/patient expects to be discharged to:: Private residence Living  Arrangements: Non-relatives/Friends(Pt talking on phone to friend Apolonio Schneiders who plans to visit tomorrow and wants to start doing PW for Power of Attorney, etc.) Available Help at Discharge: Friend(s);Available PRN/intermittently Type of Home: House Home Access: Stairs to enter [employer] of Steps: 2 Entrance Stairs-Rails: None Home Layout: One level     Bathroom Shower/Tub: Teacher, early years/pre: Standard     Home Equipment: Environmental consultant - 2 wheels;Cane - single point   Additional Comments: Pt reports that she does not use her RW because it needs tennis balls on the back legs      Prior Functioning/Environment Level of Independence: Needs assistance  Gait / Transfers Assistance Needed: Pt amb with a SPC HH distances with assist from friend secondary to instability per pt; pt reports 3 falls in the last year; pt Ind with transfers and bed mobility; pt uses w/c for MD apts ADL's / Homemaking Assistance Needed: Pt Ind with bathing  and dressing            OT Problem List: Decreased strength;Decreased activity tolerance;Impaired balance (sitting and/or standing)      OT Treatment/Interventions: Self-care/ADL training;Therapeutic activities;Energy conservation;Balance training;Patient/family education    OT Goals(Current goals can be found in the care plan section) Acute Rehab OT Goals Patient Stated Goal: To be able to take care of myself again! OT Goal Formulation: With patient Time For Goal Achievement: 04/12/19 Potential to Achieve Goals: Good ADL Goals Pt Will Perform Lower Body Dressing: with set-up;with supervision;sit to/from stand Pt Will Transfer to Toilet: with set-up;with supervision;stand pivot transfer;regular height toilet  OT Frequency: Min 2X/week   Barriers to D/C:            Co-evaluation              AM-PAC OT "6 Clicks" Daily Activity     Outcome Measure Help from another person eating meals?: None Help from another person  taking care of personal grooming?: None Help from another person toileting, which includes using toliet, bedpan, or urinal?: A Little Help from another person bathing (including washing, rinsing, drying)?: A Little Help from another person to put on and taking off regular upper body clothing?: None Help from another person to put on and taking off regular lower body clothing?: A Little 6 Click Score: 21   End of Session Equipment Utilized During Treatment: Gait belt Nurse Communication: Other (comment)(pt was shaky and started to get sleepy with BP 172/56 and pulse 62.)  Activity Tolerance: Treatment limited secondary to medical complications (Comment) Patient left: in bed;with call bell/phone within reach;with bed alarm set;with nursing/sitter in room  OT Visit Diagnosis: Unsteadiness on feet (R26.81);Repeated falls (R29.6);Muscle weakness (generalized) (M62.81)                Time: 4403-4742 OT Time Calculation (min): 38 min Charges:  OT General Charges $OT Visit: 1 Visit OT Evaluation $OT Eval Low Complexity: 1 Low OT Treatments $Self Care/Home Management : 23-37 mins  Chrys Racer, OTR/L, [place] ascom [phone] 03/29/19, 12:42 PM

## 2019-03-29 NOTE — Consult Note (Signed)
Reason for Consult: strokes Referring Physician: Dr. Anselm Jungling   CC: weakness   HPI: Kosisochukwu Goldberg is an 78 y.o. female known history of prior CVA, hypertension, hyperlipidemia, diabetes mellitus, depression, GERD, and arthritis.  She presented to the emergency room via EMS services reporting progressively worsening fatigue and weakness over the last 4 days.  She reports she has been becoming weaker over the last 4 days however she became unable to ambulate today.   Patient also has a history of CKD.  On arrival BUN is 40 with creatinine 2.70.  She has elevated troponin of 82 which is increased from 71.  EKG shows no significant changes.  CT head was also completed with no acute intracranial abnormalities.  Glucose on arrival is 400. Pt is found to have embolic strokes on MRI bilaterally   Past Medical History:  Diagnosis Date  . Arthritis   . Depression    controlled  . Diabetes mellitus without complication ([place])    managed well;   Marland Kitchen GERD (gastroesophageal reflux disease)   . Hepatitis   . HLD (hyperlipidemia)   . Hypertension    somewhat controlled  . 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 Polymyalgia rheumatica ([place])   . Sleep apnea   . Stroke [facility])     Past Surgical History:  Procedure Laterality Date  . ABDOMINAL HYSTERECTOMY    . BILATERAL SALPINGOOPHORECTOMY    . ESOPHAGEAL DILATION    . OOPHORECTOMY    . REDUCTION MAMMAPLASTY Bilateral 2003  . REPLACEMENT TOTAL KNEE Right   . TONSILLECTOMY AND ADENOIDECTOMY      Family History  Problem Relation Age of Onset  . CAD Brother   . Kidney cancer Mother   . Breast cancer Cousin     Social History:  reports that she has never smoked. She has never used smokeless tobacco. She reports that she does not drink alcohol or use drugs.  Allergies  Allergen Reactions  . Iodinated Diagnostic Agents Shortness Of Breath    Only with nuclear stress test per pt Only with nuclear stress test per pt   .  Penicillins Rash    Rash at injection site. Pt states she can take Amoxicillin & Keflex   . Escitalopram   . Lithium   . Erythromycin Nausea Only and Nausea And Vomiting    Other reaction(s): Other (See Comments) dehydration     Medications: I have reviewed the patient's current medications.    Physical Examination: Blood pressure (!) 175/73, pulse 62, temperature 97.8 F (36.6 C), temperature source Oral, resp. rate 19, height 5\' 1"  (1.549 m), weight 86.2 kg, SpO2 98 %.  HEENT-  Normocephalic, no lesions, without obvious abnormality.  Normal external eye and conjunctiva.  Normal TM's bilaterally.  Normal auditory canals and external ears.   Neurological Examination   Mental Status: Alert, oriented, thought content appropriate.  Speech fluent without evidence of aphasia.  Able to follow 3 step commands without difficulty. Cranial Nerves: II: Discs flat bilaterally; Visual fields grossly normal, pupils equal, round, reactive to light and accommodation III,IV, VI: ptosis not present, extra-ocular motions intact bilaterally V,VII: smile symmetric, facial light touch sensation normal bilaterally VIII: hearing normal bilaterally IX,X: gag reflex present XI: bilateral shoulder shrug XII: midline tongue extension Motor: Generalized weakness  Tone and bulk:normal tone throughout; no atrophy noted Sensory: Pinprick and light touch intact throughout, bilaterally Deep Tendon Reflexes: 2+ and symmetric throughout Plantars: Right: downgoing   Left: downgoing Cerebellar:  normal finger-to-nose, normal rapid alternating movements and normal heel-to-shin test Gait: not tested      Laboratory Studies:   Basic Metabolic Panel: Recent Labs  Lab 03/27/19 1804 03/28/19 0638 03/29/19 0540  NA 134* 137 139  K 4.1 4.0 3.4*  CL 98 101 106  CO2 24 21* 24  GLUCOSE 371* 368* 291*  BUN 40* 38* 39*  CREATININE 2.70* 2.67* 2.44*  CALCIUM 9.3 9.4 8.5*    Liver Function Tests: Recent  Labs  Lab 03/27/19 1804  AST 17  ALT 14  ALKPHOS 79  BILITOT 1.1  PROT 7.0  ALBUMIN 3.7   No results for input(s): LIPASE, AMYLASE in the last 168 hours. No results for input(s): AMMONIA in the last 168 hours.  CBC: Recent Labs  Lab 03/27/19 1804 03/28/19 0638 03/29/19 0540  WBC 10.1 10.5 8.8  HGB 13.2 13.1 11.3*  HCT 37.7 37.8 33.0*  MCV 84.0 84.6 85.9  PLT 390 399 315    Cardiac Enzymes: No results for input(s): CKTOTAL, CKMB, CKMBINDEX, TROPONINI in the last 168 hours.  BNP: Invalid input(s): POCBNP  CBG: Recent Labs  Lab 03/28/19 0925 03/28/19 1200 03/28/19 1556 03/28/19 2056 03/29/19 0721  GLUCAP 364* 270* 286* 238* 265*    Microbiology: Results for orders placed or performed during the hospital encounter of 03/27/19  SARS Coronavirus 2 (CEPHEID - Performed in [facility] hospital lab), Hosp Order     Status: None   Collection Time: 03/27/19  6:04 PM   Specimen: Nasopharyngeal Swab  Result Value Ref Range Status   SARS Coronavirus 2 NEGATIVE NEGATIVE Final    Comment: (NOTE) If result is NEGATIVE SARS-CoV-2 target nucleic acids are NOT DETECTED. The SARS-CoV-2 RNA is generally detectable in upper and lower  respiratory specimens during the acute phase of infection. The lowest  concentration of SARS-CoV-2 viral copies this assay can detect is 250  copies / mL. A negative result does not preclude SARS-CoV-2 infection  and should not be used as the sole basis for treatment or other  patient management decisions.  A negative result may occur with  improper specimen collection / handling, submission of specimen other  than nasopharyngeal swab, presence of viral mutation(s) within the  areas targeted by this assay, and inadequate number of viral copies  (<250 copies / mL). A negative result must be combined with clinical  observations, patient history, and epidemiological information. If result is POSITIVE SARS-CoV-2 target nucleic acids are  DETECTED. The SARS-CoV-2 RNA is generally detectable in upper and lower  respiratory specimens dur ing the acute phase of infection.  Positive  results are indicative of active infection with SARS-CoV-2.  Clinical  correlation with patient history and other diagnostic information is  necessary to determine patient infection status.  Positive results do  not rule out bacterial infection or co-infection with other viruses. If result is PRESUMPTIVE POSTIVE SARS-CoV-2 nucleic acids MAY BE PRESENT.   A presumptive positive result was obtained on the submitted specimen  and confirmed on repeat testing.  While 2019 novel coronavirus  (SARS-CoV-2) nucleic acids may be present in the submitted sample  additional confirmatory testing may be necessary for epidemiological  and / or clinical management purposes  to differentiate between  SARS-CoV-2 and other Sarbecovirus currently known to infect humans.  If clinically indicated additional testing with an alternate test  methodology [phone]) is advised. The SARS-CoV-2 RNA is generally  detectable in upper and lower respiratory sp ecimens during the acute  phase of infection.  The expected result is Negative. Fact Sheet for Patients:  StrictlyIdeas.no Fact Sheet for Healthcare Providers: BankingDealers.co.za This test is not yet approved or cleared by the [place] FDA and has been authorized for detection and/or diagnosis of SARS-CoV-2 by FDA under an Emergency Use Authorization (EUA).  This EUA will remain in effect (meaning this test can be used) for the duration of the COVID-19 declaration under Section 564(b)(1) of the Act, 21 U.S.C. section 360bbb-3(b)(1), unless the authorization is terminated or revoked sooner. Performed at [facility], [address] [place],  [address]     Coagulation Studies: No results for input(s): LABPROT, INR in the last 72  hours.  Urinalysis:  Recent Labs  Lab 03/27/19 1804  COLORURINE YELLOW*  LABSPEC 1.010  PHURINE 7.0  GLUCOSEU >=500*  HGBUR NEGATIVE  BILIRUBINUR NEGATIVE  KETONESUR 5*  PROTEINUR 100*  NITRITE NEGATIVE  LEUKOCYTESUR NEGATIVE    Lipid Panel:     Component Value Date/Time   CHOL 189 12/20/2015 0513   TRIG 191 (H) 12/20/2015 0513   HDL 41 12/20/2015 0513   CHOLHDL 4.6 12/20/2015 0513   VLDL 38 12/20/2015 0513   LDLCALC 110 (H) 12/20/2015 0513    HgbA1C:  Lab Results  Component Value Date   HGBA1C 6.9 (H) 12/20/2015    Urine Drug Screen:      Component Value Date/Time   LABOPIA NONE DETECTED 12/19/2015 2026   COCAINSCRNUR NONE DETECTED 12/19/2015 2026   LABBENZ NONE DETECTED 12/19/2015 2026   AMPHETMU NONE DETECTED 12/19/2015 2026   THCU NONE DETECTED 12/19/2015 2026   LABBARB NONE DETECTED 12/19/2015 2026    Alcohol Level: No results for input(s): ETH in the last 168 hours.    Imaging: Ct Head Wo Contrast  Result Date: 03/27/2019 CLINICAL DATA:  78 year old female with acute weakness. EXAM: CT HEAD WITHOUT CONTRAST TECHNIQUE: Contiguous axial images were obtained from the base of the skull through the vertex without intravenous contrast. COMPARISON:  12/18/2018 CT and prior studies FINDINGS: Brain: No evidence of acute infarction, hemorrhage, hydrocephalus, extra-axial collection or mass lesion/mass effect. Atrophy and chronic small-vessel white matter ischemic changes again noted. Vascular: Mild carotid atherosclerotic calcifications again noted. Skull: Normal. Negative for fracture or focal lesion. Sinuses/Orbits: No acute finding. Other: None. IMPRESSION: 1. No evidence of acute intracranial abnormality. 2. Atrophy and chronic small-vessel white matter ischemic changes. Electronically Signed   By: Margarette Canada M.D.   On: 03/27/2019 19:54   Mr Brain Wo Contrast  Result Date: 03/28/2019 CLINICAL DATA:  Focal neuro deficit. Increasing fatigue and weakness 4 days.  EXAM: MRI HEAD WITHOUT CONTRAST TECHNIQUE: Multiplanar, multiecho pulse sequences of the brain and surrounding structures were obtained without intravenous contrast. COMPARISON:  MRI head 12/20/2015 FINDINGS: Brain: Multiple small areas of acute infarct involving the left posterior temporal lobe and left inferior occipital parietal white matter. Small acute infarct left putamen and right internal capsule anteriorly. Small acute infarct in the periventricular white matter of the right frontal and right parietal lobe. Small acute infarct left parietal white matter. Generalized atrophy, moderate. Mild chronic microvascular ischemic change in the white matter. Chronic microhemorrhage in the thalamus bilaterally, not seen previously. T1 hyperintensity left putamen not seen previously and likely due to mineralization Vascular: Normal arterial flow voids Skull and upper cervical spine: Negative Sinuses/Orbits: Negative Other: None IMPRESSION: Multiple scattered small areas of infarct in both cerebral hemispheres. Possible emboli. Possible hypercoagulability. Microhemorrhage in the thalamus bilaterally peers chronic but not present in 2017. Atrophy and  chronic ischemic changes in the white matter. Electronically Signed   By: Franchot Gallo M.D.   On: 03/28/2019 13:07   US Carotid Bilateral (at [facility] Only)  Result Date: 03/28/2019 CLINICAL DATA:  78 year old female with aphasia EXAM: BILATERAL CAROTID DUPLEX ULTRASOUND TECHNIQUE: Pearline Cables scale imaging, color Doppler and duplex ultrasound were performed of bilateral carotid and vertebral arteries in the neck. COMPARISON:  Prior duplex carotid ultrasound 12/20/2015 and 10/06/2010 FINDINGS: Criteria: Quantification of carotid stenosis is based on velocity parameters that correlate the residual internal carotid diameter with NASCET-based stenosis levels, using the diameter of the distal internal carotid lumen as the denominator for stenosis measurement. The following  velocity measurements were obtained: RIGHT ICA: 81/22 cm/sec CCA: 05/6 cm/sec SYSTOLIC ICA/CCA RATIO:  0.9 ECA:  150 cm/sec LEFT ICA: 78/22 cm/sec CCA: 97/94 cm/sec SYSTOLIC ICA/CCA RATIO:  1.0 ECA:  126 cm/sec RIGHT CAROTID ARTERY: Stable mild heterogeneous atherosclerotic plaque in the proximal internal carotid artery. By peak systolic velocity criteria, the estimated stenosis remains less than 50%. RIGHT VERTEBRAL ARTERY:  Patent with normal antegrade flow. LEFT CAROTID ARTERY: Mild focal heterogeneous atherosclerotic plaque in the proximal internal carotid artery. By peak systolic velocity criteria, the estimated stenosis remains less than 50%. LEFT VERTEBRAL ARTERY:  Patent with normal antegrade flow. IMPRESSION: 1. No significant interval progression of mild bilateral internal carotid artery disease compared to 12/20/2015. 2. Mild (1-49%) stenosis proximal right internal carotid artery secondary to focal heterogeneous atherosclerotic plaque. 3. Mild (1-49%) stenosis proximal left internal carotid artery secondary to focal heterogeneous atherosclerotic plaque. 4. Vertebral arteries remain patent with normal antegrade flow. Signed, Criselda Peaches, MD, [facility] Electronically Signed   By: Jacqulynn Cadet M.D.   On: 03/28/2019 09:57   Dg Chest Portable 1 View  Result Date: 03/27/2019 CLINICAL DATA:  Weakness for several days. EXAM: PORTABLE CHEST 1 VIEW COMPARISON:  PA and lateral chest 12/19/2018 and full 12/19/2015. FINDINGS: Lungs are clear. There is cardiomegaly. No pneumothorax or pleural fluid. No acute or focal bony abnormality. IMPRESSION: Cardiomegaly without acute disease. Electronically Signed   By: Inge Rise M.D.   On: 03/27/2019 18:14     Assessment/Plan:  78 y.o. female known history of prior CVA, hypertension, hyperlipidemia, diabetes mellitus, depression, GERD, and arthritis.  She presented to the emergency  room via EMS services reporting progressively worsening fatigue and weakness over the last 4 days.  She reports she has been becoming weaker over the last 4 days however she became unable to ambulate today.   Patient also has a history of CKD.  On arrival BUN is 40 with creatinine 2.70.  She has elevated troponin of 82 which is increased from 71.  EKG shows no significant changes.  CT head was also completed with no acute intracranial abnormalities.  Glucose on arrival is 400. Pt is found to have embolic strokes on MRI bilaterally   - awaiting echo results - will order MRA head and neck as last one done 2017 to look for any vessel abnormalities  - cont ASA and statin - will likely need TEE if 2d echo is normal 03/29/2019, 8:53 AM

## 2019-03-29 NOTE — Plan of Care (Signed)
  Problem: Clinical Measurements: Goal: Ability to maintain clinical measurements within normal limits will improve Outcome: Progressing Goal: Respiratory complications will improve Outcome: Progressing   Problem: Activity: Goal: Risk for activity intolerance will decrease Outcome: Progressing   Problem: Safety: Goal: Ability to remain free from injury will improve Outcome: Progressing   Problem: Education: Goal: Knowledge of disease or condition will improve Outcome: Progressing

## 2019-03-30 LAB — GLUCOSE, CAPILLARY
Glucose-Capillary: 242 mg/dL — ABNORMAL HIGH (ref 70–99)
Glucose-Capillary: 337 mg/dL — ABNORMAL HIGH (ref 70–99)
Glucose-Capillary: 215 mg/dL — ABNORMAL HIGH (ref 70–99)
Glucose-Capillary: 392 mg/dL — ABNORMAL HIGH (ref 70–99)

## 2019-03-30 LAB — ECHOCARDIOGRAM COMPLETE
Height: 61 in
Weight: 3040 oz

## 2019-03-30 MED ORDER — SODIUM CHLORIDE 0.9 % IV SOLN
INTRAVENOUS | Status: DC
  Administered 2019-03-31: 05:00:00 via INTRAVENOUS

## 2019-03-30 MED ORDER — HYDRALAZINE HCL 10 MG PO TABS
10.0000 mg | ORAL_TABLET | Freq: Three times a day (TID) | ORAL | Status: DC
  Administered 2019-03-30 – 2019-04-01 (×5): 10 mg via ORAL
  Filled 2019-03-30 (×8): qty 1

## 2019-03-30 MED ORDER — TAMSULOSIN HCL 0.4 MG PO CAPS
0.4000 mg | ORAL_CAPSULE | Freq: Every day | ORAL | Status: DC
  Administered 2019-03-30 – 2019-03-31 (×2): 0.4 mg via ORAL
  Filled 2019-03-30 (×2): qty 1

## 2019-03-30 NOTE — Progress Notes (Signed)
Spoke to MD [facility] regarding patients AM BP. Per MD, give 0300 dose of NTG paste that patient missed. Will continue to monitor.   Iran Sizer M

## 2019-03-30 NOTE — Progress Notes (Signed)
[facility] [place] NAME: Holly Conner    MR#:  [id number]  DATE OF BIRTH:  December 24, 1940  SUBJECTIVE:  CHIEF COMPLAINT:   Chief Complaint  Patient presents with  . Fatigue  . Failure To Thrive   Patient seen and evaluated today Awake and responds to all verbal commands Moves all extremities No slurred speech No tingling or numbness Has generalized weakness  REVIEW OF SYSTEMS:    ROS  CONSTITUTIONAL: No documented fever. Has fatigue, weakness. No weight gain, no weight loss.  EYES: No blurry or double vision.  ENT: No tinnitus. No postnasal drip. No redness of the oropharynx.  RESPIRATORY: No cough, no wheeze, no hemoptysis. No dyspnea.  CARDIOVASCULAR: No chest pain. No orthopnea. No palpitations. No syncope.  GASTROINTESTINAL: No nausea, no vomiting or diarrhea. No abdominal pain. No melena or hematochezia.  GENITOURINARY: No dysuria or hematuria.  ENDOCRINE: No polyuria or nocturia. No heat or cold intolerance.  HEMATOLOGY: No anemia. No bruising. No bleeding.  INTEGUMENTARY: No rashes. No lesions.  MUSCULOSKELETAL: No arthritis. No swelling. No gout.  NEUROLOGIC: No numbness, tingling, or ataxia. No seizure-type activity.  PSYCHIATRIC: No anxiety. No insomnia. No ADD.   DRUG ALLERGIES:   Allergies  Allergen Reactions  . Iodinated Diagnostic Agents Shortness Of Breath    Only with nuclear stress test per pt Only with nuclear stress test per pt   . Penicillins Rash    Rash at injection site. Pt states she can take Amoxicillin & Keflex   . Escitalopram   . Lithium   . Erythromycin Nausea Only and Nausea And Vomiting    Other reaction(s): Other (See Comments) dehydration     VITALS:  Blood pressure (!) 168/65, pulse (!) 59, temperature 98.1 F (36.7 C), temperature source Oral, resp. rate 19, height 5\' 1"  (1.549 m), weight 86.1 kg, SpO2 98 %.  PHYSICAL EXAMINATION:   Physical Exam  GENERAL:  78 y.o.-year-old  patient lying in the bed with no acute distress.  EYES: Pupils equal, round, reactive to light and accommodation. No scleral icterus. Extraocular muscles intact.  HEENT: Head atraumatic, normocephalic. Oropharynx dry and nasopharynx clear.  NECK:  Supple, no jugular venous distention. No thyroid enlargement, no tenderness.  LUNGS: Normal breath sounds bilaterally, no wheezing, rales, rhonchi. No use of accessory muscles of respiration.  CARDIOVASCULAR: S1, S2 normal. No murmurs, rubs, or gallops.  ABDOMEN: Soft, nontender, nondistended. Bowel sounds present. No organomegaly or mass.  EXTREMITIES: No cyanosis, clubbing or edema b/l.    NEUROLOGIC: Cranial nerves II through XII are intact. No focal Motor or sensory deficits b/l.   PSYCHIATRIC: The patient is alert and oriented x 3.  SKIN: No obvious rash, lesion, or ulcer.   LABORATORY PANEL:   CBC Recent Labs  Lab 03/29/19 0540  WBC 8.8  HGB 11.3*  HCT 33.0*  PLT 315   ------------------------------------------------------------------------------------------------------------------ Chemistries  Recent Labs  Lab 03/27/19 1804  03/29/19 0540  NA 134*   < > 139  K 4.1   < > 3.4*  CL 98   < > 106  CO2 24   < > 24  GLUCOSE 371*   < > 291*  BUN 40*   < > 39*  CREATININE 2.70*   < > 2.44*  CALCIUM 9.3   < > 8.5*  AST 17  --   --   ALT 14  --   --   ALKPHOS 79  --   --  BILITOT 1.1  --   --    < > = values in this interval not displayed.   ------------------------------------------------------------------------------------------------------------------  Cardiac Enzymes No results for input(s): TROPONINI in the last 168 hours. ------------------------------------------------------------------------------------------------------------------  RADIOLOGY:  Mr Angio Head Wo Contrast  Result Date: 03/29/2019 CLINICAL DATA:  Multiple infarctions seen yesterday. Punctate bilateral infarctions. EXAM: MRA NECK WITHOUT  CONTRAST MRA  HEAD WITHOUT CONTRAST TECHNIQUE: Multiplanar and multiecho pulse sequences of the neck were obtained without intravenous contrast. Angiographic images of the neck were obtained using MRA technique without intravenous contast.; Angiographic images of the Circle of Willis were obtained using MRA technique without intravenous contrast. COMPARISON:  None. FINDINGS: All exams suffer from motion degradation. MRA NECK FINDINGS Both common carotid arteries are patent to the bifurcations. Both carotid bifurcations are patent. No suspicion proximal ICA stenosis. Both vertebral arteries show antegrade flow. MRA HEAD FINDINGS Both internal carotid arteries are patent through the siphon regions. There is siphon atherosclerotic narrowing. The anterior and middle cerebral vessels are patent. Diminutive A1 segment on the right. The anterior cerebral arteries receive most of there supply from the left carotid circulation. Possible left M2 stenosis. This could be artifactual. Both vertebral arteries are patent to the basilar. No basilar stenosis. Both posterior cerebral arteries show flow. IMPRESSION: Motion degraded exam. No suspicion of carotid bifurcation stenosis. Antegrade flow in both vertebral arteries. Atherosclerotic narrowing in the carotid siphon regions. Possible left M2 MCA stenosis versus artifactual signal loss. This examination does not explain the multiple punctate infarctions, which remain more likely to have originated from the heart or ascending aorta. Electronically Signed   By: Nelson Chimes M.D.   On: 03/29/2019 20:26   Mr Angio Neck Wo Contrast  Result Date: 03/29/2019 CLINICAL DATA:  Multiple infarctions seen yesterday. Punctate bilateral infarctions. EXAM: MRA NECK WITHOUT  CONTRAST MRA HEAD WITHOUT CONTRAST TECHNIQUE: Multiplanar and multiecho pulse sequences of the neck were obtained without intravenous contrast. Angiographic images of the neck were obtained using MRA technique without intravenous  contast.; Angiographic images of the Circle of Willis were obtained using MRA technique without intravenous contrast. COMPARISON:  None. FINDINGS: All exams suffer from motion degradation. MRA NECK FINDINGS Both common carotid arteries are patent to the bifurcations. Both carotid bifurcations are patent. No suspicion proximal ICA stenosis. Both vertebral arteries show antegrade flow. MRA HEAD FINDINGS Both internal carotid arteries are patent through the siphon regions. There is siphon atherosclerotic narrowing. The anterior and middle cerebral vessels are patent. Diminutive A1 segment on the right. The anterior cerebral arteries receive most of there supply from the left carotid circulation. Possible left M2 stenosis. This could be artifactual. Both vertebral arteries are patent to the basilar. No basilar stenosis. Both posterior cerebral arteries show flow. IMPRESSION: Motion degraded exam. No suspicion of carotid bifurcation stenosis. Antegrade flow in both vertebral arteries. Atherosclerotic narrowing in the carotid siphon regions. Possible left M2 MCA stenosis versus artifactual signal loss. This examination does not explain the multiple punctate infarctions, which remain more likely to have originated from the heart or ascending aorta. Electronically Signed   By: Nelson Chimes M.D.   On: 03/29/2019 20:26     ASSESSMENT AND PLAN:   78 year old elderly female patient is with history of CVA, hypertension, hyperlipidemia, type 2 diabetes mellitus, GERD, arthritis, chronic kidney disease stage III currently in the hospital pedal under hospitalist service for generalized weakness  -Acute on chronic kidney disease stage III Secondary to dehydration- baseline creatinin 2.13 IV fluids Monitor renal function-  improving.  -Embolic CVA Started aspirin Continue statin medication Neurology consult Echocardiogram reviewed , no clear source. Cardiology consult for transesophageal echo MRA brain done. No  clear source of emboli.  -Uncontrolled diabetes mellitus Diabetic diet Lantus insulin with sliding scale coverage with insulin Monitor blood sugars closely.  -Hyperlipidemia Continue statin medication  -DVT prophylaxis subcu heparin  -Dehydration IV fluids  -Ambulatory dysfunction PT evaluation- suggested Home health.  All the records are reviewed and case discussed with Care Management/Social Worker. Management plans discussed with the patient, family and they are in agreement.  CODE STATUS: Full code  DVT Prophylaxis: SCDs  TOTAL TIME TAKING CARE OF THIS PATIENT: 37 minutes.   POSSIBLE D/C IN 1 to 2 DAYS, DEPENDING ON CLINICAL CONDITION.  Vaughan Basta M.D on 03/30/2019 at 4:49 PM  Between 7am to 6pm - Pager - [phone]  After 6pm go to [email or web address] - password [email or web address] [facility]  Office  [phone]  CC: Primary care physician; Dion Body, MD  Note: This dictation was prepared with Dragon dictation along with smaller phrase technology. Any transcriptional errors that result from this process are unintentional.

## 2019-03-30 NOTE — Plan of Care (Signed)
  Problem: Clinical Measurements: Goal: Ability to maintain clinical measurements within normal limits will improve Outcome: Progressing Goal: Respiratory complications will improve Outcome: Progressing Goal: Cardiovascular complication will be avoided Outcome: Progressing   Problem: Activity: Goal: Risk for activity intolerance will decrease Outcome: Progressing   Problem: Elimination: Goal: Will not experience complications related to urinary retention Outcome: Progressing   Problem: Pain Managment: Goal: General experience of comfort will improve Outcome: Progressing   Problem: Safety: Goal: Ability to remain free from injury will improve Outcome: Progressing   Problem: Self-Care: Goal: Ability to participate in self-care as condition permits will improve Outcome: Progressing Goal: Ability to communicate needs accurately will improve Outcome: Progressing

## 2019-03-31 ENCOUNTER — Inpatient Hospital Stay
Admit: 2019-03-31 | Discharge: 2019-03-31 | Disposition: A | Discharge status: Home / Self Care | Payer: Medicare Other | Attending: Internal Medicine | Admitting: Internal Medicine

## 2019-03-31 ENCOUNTER — Encounter
Admission: EM | Disposition: A | Discharge status: Home Health | Payer: Self-pay | Source: Home / Self Care | Attending: Internal Medicine

## 2019-03-31 ENCOUNTER — Encounter: Payer: Self-pay | Admitting: Internal Medicine

## 2019-03-31 HISTORY — PX: TEE WITHOUT CARDIOVERSION: SHX5443

## 2019-03-31 LAB — GLUCOSE, CAPILLARY
Glucose-Capillary: 275 mg/dL — ABNORMAL HIGH (ref 70–99)
Glucose-Capillary: 177 mg/dL — ABNORMAL HIGH (ref 70–99)
Glucose-Capillary: 313 mg/dL — ABNORMAL HIGH (ref 70–99)
Glucose-Capillary: 293 mg/dL — ABNORMAL HIGH (ref 70–99)

## 2019-03-31 LAB — URINALYSIS, COMPLETE (UACMP) WITH MICROSCOPIC
Bacteria, UA: NONE SEEN
Bilirubin Urine: NEGATIVE
Glucose, UA: >=500 mg/dL — AB
Hgb urine dipstick: NEGATIVE
Ketones, ur: NEGATIVE mg/dL
Leukocytes,Ua: NEGATIVE
Nitrite: NEGATIVE
Protein, ur: 100 mg/dL — AB
Specific Gravity, Urine: 1.015 (ref 1.005–1.030)
pH: 5 (ref 5.0–8.0)

## 2019-03-31 LAB — BASIC METABOLIC PANEL
Anion gap: 8 (ref 5–15)
BUN: 37 mg/dL — ABNORMAL HIGH (ref 8–23)
CO2: 22 mmol/L (ref 22–32)
Calcium: 9 mg/dL (ref 8.9–10.3)
Chloride: 109 mmol/L (ref 98–111)
Creatinine, Ser: 2.23 mg/dL — ABNORMAL HIGH (ref 0.44–1.00)
GFR calc Af Amer: 23 mL/min — ABNORMAL LOW (ref >60)
GFR calc non Af Amer: 20 mL/min — ABNORMAL LOW (ref >60)
Glucose, Bld: 265 mg/dL — ABNORMAL HIGH (ref 70–99)
Potassium: 3.6 mmol/L (ref 3.5–5.1)
Sodium: 139 mmol/L (ref 135–145)

## 2019-03-31 SURGERY — ECHOCARDIOGRAM, TRANSESOPHAGEAL
Anesthesia: Moderate Sedation

## 2019-03-31 MED ORDER — TRESIBA FLEXTOUCH 200 UNIT/ML ~~LOC~~ SOPN: 30.0000 [IU] | PEN_INJECTOR | Freq: Every day | SUBCUTANEOUS | 0 refills | Status: DC

## 2019-03-31 MED ORDER — TAMSULOSIN HCL 0.4 MG PO CAPS: 0.4000 mg | ORAL_CAPSULE | Freq: Every day | ORAL | 0 refills | Status: DC

## 2019-03-31 MED ORDER — ADULT MULTIVITAMIN W/MINERALS CH: 1.0000 | ORAL_TABLET | Freq: Every day | ORAL | 0 refills | Status: DC

## 2019-03-31 MED ORDER — FENTANYL CITRATE (PF) 100 MCG/2ML IJ SOLN
INTRAMUSCULAR | Status: AC | PRN
  Administered 2019-03-31: 25 ug via INTRAVENOUS
  Administered 2019-03-31: 50 ug via INTRAVENOUS

## 2019-03-31 MED ORDER — FENTANYL CITRATE (PF) 100 MCG/2ML IJ SOLN
INTRAMUSCULAR | Status: AC
  Filled 2019-03-31: qty 2

## 2019-03-31 MED ORDER — SODIUM CHLORIDE 0.9 % IV BOLUS
250.0000 mL | Freq: Once | INTRAVENOUS | Status: AC
  Administered 2019-03-31: 250 mL via INTRAVENOUS

## 2019-03-31 MED ORDER — HALOPERIDOL LACTATE 5 MG/ML IJ SOLN
2.5000 mg | Freq: Once | INTRAMUSCULAR | Status: AC | PRN
  Administered 2019-03-31: 2.5 mg via INTRAMUSCULAR

## 2019-03-31 MED ORDER — LIDOCAINE VISCOUS HCL 2 % MT SOLN
OROMUCOSAL | Status: AC
  Filled 2019-03-31: qty 15

## 2019-03-31 MED ORDER — BUTAMBEN-TETRACAINE-BENZOCAINE 2-2-14 % EX AERO
INHALATION_SPRAY | CUTANEOUS | Status: AC
  Filled 2019-03-31: qty 5

## 2019-03-31 MED ORDER — ALPRAZOLAM 0.25 MG PO TABS
0.2500 mg | ORAL_TABLET | Freq: Two times a day (BID) | ORAL | Status: DC | PRN
  Administered 2019-04-01: 0.25 mg via ORAL
  Filled 2019-03-31: qty 1

## 2019-03-31 MED ORDER — MIDAZOLAM HCL 5 MG/5ML IJ SOLN
INTRAMUSCULAR | Status: AC
  Filled 2019-03-31: qty 5

## 2019-03-31 MED ORDER — ASPIRIN EC 81 MG PO TBEC: 81.0000 mg | DELAYED_RELEASE_TABLET | Freq: Every day | ORAL | 2 refills | Status: DC

## 2019-03-31 MED ORDER — AMLODIPINE BESYLATE 10 MG PO TABS: 10.0000 mg | ORAL_TABLET | Freq: Every day | ORAL | 0 refills | Status: DC

## 2019-03-31 MED ORDER — SODIUM CHLORIDE FLUSH 0.9 % IV SOLN
INTRAVENOUS | Status: AC
  Filled 2019-03-31: qty 10

## 2019-03-31 MED ORDER — MIDAZOLAM HCL 2 MG/2ML IJ SOLN
INTRAMUSCULAR | Status: AC | PRN
  Administered 2019-03-31 (×2): 1 mg via INTRAVENOUS

## 2019-03-31 NOTE — Progress Notes (Signed)
Physical Therapy Treatment Patient Details Name: Holly Conner MRN: [id number] DOB: July 24, 1941 Today's Date: 03/31/2019    History of Present Illness From MD progress note 03/28/19:  Pt is an 78 year old female with history of CVA, hypertension, hyperlipidemia, type 2 diabetes mellitus, GERD, arthritis, chronic kidney disease stage III currently in the hospital under hospitalist service for generalized weakness.  Assessment includes: Acute on chronic kidney disease stage III, embolic CVA, DM, HLD, h/o CVA, dehydration, and ambulatory dysfunction.    PT Comments    Pt presents with deficits in strength, transfers, gait, balance, and activity tolerance.  Pt was Mod Ind with bed mobility tasks with extra time required but did not need to use the bed rail this session during sup to/from sit.  Pt was CGA with transfers with cues for proper sequencing but was steady without LOB.  Pt was able to amb 20' with a RW and CGA but with reduced cadence and activity tolerance this session.  Pt reported no adverse symptoms with amb other than fatigue, HR and SpO2 WNL.  Pt will benefit from HHPT services upon discharge to safely address above deficits for decreased caregiver assistance and eventual return to PLOF.      Follow Up Recommendations  Home health PT;Supervision for mobility/OOB     Equipment Recommendations  Other (comment)(Pt reports having a RW but needs real leg sliders/tennis balls to use it)    Recommendations for Other Services       Precautions / Restrictions Precautions Precautions: Fall Restrictions Weight Bearing Restrictions: No    Mobility  Bed Mobility Overal bed mobility: Modified Independent             General bed mobility comments: Min increased time and effort with bed mobility tasks but no physical assistance required  Transfers Overall transfer level: Needs assistance Equipment used: Rolling walker (2 wheeled) Transfers: Sit to/from Stand Sit to Stand: Min  guard         General transfer comment: Min verbal cues for hand placement during transfers  Ambulation/Gait Ambulation/Gait assistance: Min guard Gait Distance (Feet): 20 Feet Assistive device: Rolling walker (2 wheeled) Gait Pattern/deviations: Step-through pattern;Decreased step length - right;Decreased step length - left;Trunk flexed Gait velocity: decreased   General Gait Details: Min verbal cues for amb closer to the RW especially during turns; decreased cadence and activity tolerance with amb this session   Stairs             Wheelchair Mobility    Modified Rankin (Stroke Patients Only)       Balance Overall balance assessment: Needs assistance Sitting-balance support: No upper extremity supported Sitting balance-Leahy Scale: Good     Standing balance support: Bilateral upper extremity supported Standing balance-Leahy Scale: Good                              Cognition Arousal/Alertness: Awake/alert Behavior During Therapy: Anxious;WFL for tasks assessed/performed Overall Cognitive Status: No family/caregiver present to determine baseline cognitive functioning                                        Exercises Total Joint Exercises Ankle Circles/Pumps: Strengthening;Both;10 reps Quad Sets: Strengthening;Both;10 reps Heel Slides: AROM;Both;10 reps Hip ABduction/ADduction: AROM;Both;10 reps Straight Leg Raises: AROM;Both;10 reps Long Arc Quad: Strengthening;Both;10 reps Knee Flexion: Strengthening;Both;10 reps Marching in Standing: AROM;Both;10 reps;Standing    General  Comments        Pertinent Vitals/Pain Pain Assessment: No/denies pain    Home Living                      Prior Function            PT Goals (current goals can now be found in the care plan section) Progress towards PT goals: Progressing toward goals    Frequency    Min 2X/week      PT Plan Current plan remains appropriate     Co-evaluation              AM-PAC PT "6 Clicks" Mobility   Outcome Measure  Help needed turning from your back to your side while in a flat bed without using bedrails?: None Help needed moving from lying on your back to sitting on the side of a flat bed without using bedrails?: None Help needed moving to and from a bed to a chair (including a wheelchair)?: A Little Help needed standing up from a chair using your arms (e.g., wheelchair or bedside chair)?: A Little Help needed to walk in hospital room?: A Little Help needed climbing 3-5 steps with a railing? : A Little 6 Click Score: 20    End of Session Equipment Utilized During Treatment: Gait belt Activity Tolerance: Patient tolerated treatment well Patient left: in bed;with call bell/phone within reach;with bed alarm set;with nursing/sitter in room Nurse Communication: Mobility status PT Visit Diagnosis: Muscle weakness (generalized) (M62.81);Difficulty in walking, not elsewhere classified (R26.2);History of falling (Z91.81)     Time: 2919-1660 PT Time Calculation (min) (ACUTE ONLY): 23 min  Charges:  $Gait Training: 8-22 mins $Therapeutic Exercise: 8-22 mins                     D. Scott Naomi Fitton PT, DPT 03/31/19, 5:46 PM

## 2019-03-31 NOTE — Progress Notes (Signed)
[facility] NAME: Holly Conner    MR#:  [id number]  DATE OF BIRTH:  16-Jul-1941  SUBJECTIVE:  CHIEF COMPLAINT:   Chief Complaint  Patient presents with  . Fatigue  . Failure To Thrive   Patient seen and evaluated today Awake and responds to all verbal commands Moves all extremities No slurred speech No tingling or numbness Has generalized weakness She had some confusion/agitation since last night.  REVIEW OF SYSTEMS:    ROS  CONSTITUTIONAL: No documented fever. Has fatigue, weakness. No weight gain, no weight loss.  EYES: No blurry or double vision.  ENT: No tinnitus. No postnasal drip. No redness of the oropharynx.  RESPIRATORY: No cough, no wheeze, no hemoptysis. No dyspnea.  CARDIOVASCULAR: No chest pain. No orthopnea. No palpitations. No syncope.  GASTROINTESTINAL: No nausea, no vomiting or diarrhea. No abdominal pain. No melena or hematochezia.  GENITOURINARY: No dysuria or hematuria.  ENDOCRINE: No polyuria or nocturia. No heat or cold intolerance.  HEMATOLOGY: No anemia. No bruising. No bleeding.  INTEGUMENTARY: No rashes. No lesions.  MUSCULOSKELETAL: No arthritis. No swelling. No gout.  NEUROLOGIC: No numbness, tingling, or ataxia. No seizure-type activity.  PSYCHIATRIC: No anxiety. No insomnia. No ADD.   DRUG ALLERGIES:   Allergies  Allergen Reactions  . Iodinated Diagnostic Agents Shortness Of Breath    Only with nuclear stress test per pt Only with nuclear stress test per pt   . Penicillins Rash    Rash at injection site. Pt states she can take Amoxicillin & Keflex   . Escitalopram   . Lithium   . Erythromycin Nausea Only and Nausea And Vomiting    Other reaction(s): Other (See Comments) dehydration     VITALS:  Blood pressure (!) 169/51, pulse 61, temperature 98.1 F (36.7 C), temperature source Oral, resp. rate 14, height 5\' 1"  (1.549 m), weight 85.3 kg, SpO2 98 %.  PHYSICAL EXAMINATION:    Physical Exam  GENERAL:  78 y.o.-year-old patient lying in the bed with no acute distress.  EYES: Pupils equal, round, reactive to light and accommodation. No scleral icterus. Extraocular muscles intact.  HEENT: Head atraumatic, normocephalic. Oropharynx dry and nasopharynx clear.  NECK:  Supple, no jugular venous distention. No thyroid enlargement, no tenderness.  LUNGS: Normal breath sounds bilaterally, no wheezing, rales, rhonchi. No use of accessory muscles of respiration.  CARDIOVASCULAR: S1, S2 normal. No murmurs, rubs, or gallops.  ABDOMEN: Soft, nontender, nondistended. Bowel sounds present. No organomegaly or mass.  EXTREMITIES: No cyanosis, clubbing or edema b/l.    NEUROLOGIC: Cranial nerves II through XII are intact. No focal Motor or sensory deficits b/l.   PSYCHIATRIC: The patient is alert and oriented x 2-3.  SKIN: No obvious rash, lesion, or ulcer.   LABORATORY PANEL:   CBC Recent Labs  Lab 03/29/19 0540  WBC 8.8  HGB 11.3*  HCT 33.0*  PLT 315   ------------------------------------------------------------------------------------------------------------------ Chemistries  Recent Labs  Lab 03/27/19 1804  03/31/19 0542  NA 134*   < > 139  K 4.1   < > 3.6  CL 98   < > 109  CO2 24   < > 22  GLUCOSE 371*   < > 265*  BUN 40*   < > 37*  CREATININE 2.70*   < > 2.23*  CALCIUM 9.3   < > 9.0  AST 17  --   --   ALT 14  --   --   ALKPHOS 79  --   --  BILITOT 1.1  --   --    < > = values in this interval not displayed.   ------------------------------------------------------------------------------------------------------------------  Cardiac Enzymes No results for input(s): TROPONINI in the last 168 hours. ------------------------------------------------------------------------------------------------------------------  RADIOLOGY:  Mr Angio Head Wo Contrast  Result Date: 03/29/2019 CLINICAL DATA:  Multiple infarctions seen yesterday. Punctate bilateral  infarctions. EXAM: MRA NECK WITHOUT  CONTRAST MRA HEAD WITHOUT CONTRAST TECHNIQUE: Multiplanar and multiecho pulse sequences of the neck were obtained without intravenous contrast. Angiographic images of the neck were obtained using MRA technique without intravenous contast.; Angiographic images of the Circle of Willis were obtained using MRA technique without intravenous contrast. COMPARISON:  None. FINDINGS: All exams suffer from motion degradation. MRA NECK FINDINGS Both common carotid arteries are patent to the bifurcations. Both carotid bifurcations are patent. No suspicion proximal ICA stenosis. Both vertebral arteries show antegrade flow. MRA HEAD FINDINGS Both internal carotid arteries are patent through the siphon regions. There is siphon atherosclerotic narrowing. The anterior and middle cerebral vessels are patent. Diminutive A1 segment on the right. The anterior cerebral arteries receive most of there supply from the left carotid circulation. Possible left M2 stenosis. This could be artifactual. Both vertebral arteries are patent to the basilar. No basilar stenosis. Both posterior cerebral arteries show flow. IMPRESSION: Motion degraded exam. No suspicion of carotid bifurcation stenosis. Antegrade flow in both vertebral arteries. Atherosclerotic narrowing in the carotid siphon regions. Possible left M2 MCA stenosis versus artifactual signal loss. This examination does not explain the multiple punctate infarctions, which remain more likely to have originated from the heart or ascending aorta. Electronically Signed   By: Nelson Chimes M.D.   On: 03/29/2019 20:26   Mr Angio Neck Wo Contrast  Result Date: 03/29/2019 CLINICAL DATA:  Multiple infarctions seen yesterday. Punctate bilateral infarctions. EXAM: MRA NECK WITHOUT  CONTRAST MRA HEAD WITHOUT CONTRAST TECHNIQUE: Multiplanar and multiecho pulse sequences of the neck were obtained without intravenous contrast. Angiographic images of the neck were  obtained using MRA technique without intravenous contast.; Angiographic images of the Circle of Willis were obtained using MRA technique without intravenous contrast. COMPARISON:  None. FINDINGS: All exams suffer from motion degradation. MRA NECK FINDINGS Both common carotid arteries are patent to the bifurcations. Both carotid bifurcations are patent. No suspicion proximal ICA stenosis. Both vertebral arteries show antegrade flow. MRA HEAD FINDINGS Both internal carotid arteries are patent through the siphon regions. There is siphon atherosclerotic narrowing. The anterior and middle cerebral vessels are patent. Diminutive A1 segment on the right. The anterior cerebral arteries receive most of there supply from the left carotid circulation. Possible left M2 stenosis. This could be artifactual. Both vertebral arteries are patent to the basilar. No basilar stenosis. Both posterior cerebral arteries show flow. IMPRESSION: Motion degraded exam. No suspicion of carotid bifurcation stenosis. Antegrade flow in both vertebral arteries. Atherosclerotic narrowing in the carotid siphon regions. Possible left M2 MCA stenosis versus artifactual signal loss. This examination does not explain the multiple punctate infarctions, which remain more likely to have originated from the heart or ascending aorta. Electronically Signed   By: Nelson Chimes M.D.   On: 03/29/2019 20:26     ASSESSMENT AND PLAN:   78 year old elderly female patient is with history of CVA, hypertension, hyperlipidemia, type 2 diabetes mellitus, GERD, arthritis, chronic kidney disease stage III currently in the hospital pedal under hospitalist service for generalized weakness  -Acute on chronic kidney disease stage III Secondary to dehydration- baseline creatinin 2.13 IV fluids Monitor renal function-  improving.  -Embolic CVA Started aspirin Continue statin medication Neurology consult appreciated with Echocardiogram reviewed , no clear  source. Cardiology consult for transesophageal echo-done, no source of emboli. MRA brain done. No clear source of emboli.  -Altered mental status This could be combination of hospital delirium and use of anesthetic meds during the procedure. Initially we are planning to discharge the patient but will hold in the hospital today, checked a UA which is negative. I tried calling patient's roommate and friend who lives with her to discuss her baseline conditions and any chances of dementia.  I could not talk to her on phone. We will monitor tonight and reevaluate and possible discharge tomorrow.  -Uncontrolled diabetes mellitus Diabetic diet Lantus insulin with sliding scale coverage with insulin Monitor blood sugars closely.  -Hyperlipidemia Continue statin medication  -DVT prophylaxis subcu heparin  -Dehydration IV fluids  -Ambulatory dysfunction PT evaluation- suggested Home health.  All the records are reviewed and case discussed with Care Management/Social Worker. Management plans discussed with the patient, family and they are in agreement.  CODE STATUS: Full code  DVT Prophylaxis: SCDs  TOTAL TIME TAKING CARE OF THIS PATIENT: 37 minutes.   POSSIBLE D/C IN 1 to 2 DAYS, DEPENDING ON CLINICAL CONDITION.  Vaughan Basta M.D on 03/31/2019 at 5:26 PM  Between 7am to 6pm - Pager - [phone]  After 6pm go to [email or web address] - password [email or web address] [facility]  Office  [phone]  CC: Primary care physician; Dion Body, MD  Note: This dictation was prepared with Dragon dictation along with smaller phrase technology. Any transcriptional errors that result from this process are unintentional.

## 2019-03-31 NOTE — Progress Notes (Signed)
Reason for Consult: strokes Referring Physician: Dr. Anselm Jungling   CC: weakness   HPI: Holly Conner is an 78 y.o. female known history of prior CVA, hypertension, hyperlipidemia, diabetes mellitus, depression, GERD, and arthritis.  She presented to the emergency room via EMS services reporting progressively worsening fatigue and weakness over the last 4 days.  She reports she has been becoming weaker over the last 4 days however she became unable to ambulate today.   Patient also has a history of CKD.  On arrival BUN is 40 with creatinine 2.70.  She has elevated troponin of 82 which is increased from 71.  EKG shows no significant changes.  CT head was also completed with no acute intracranial abnormalities.  Glucose on arrival is 400. Pt is found to have embolic strokes on MRI bilaterally.   MRA with Motion degraded exam. No suspicion of carotid bifurcation stenosis. Antegrade flow in both vertebral arteries.Atherosclerotic narrowing in the carotid siphon regions. Possibleleft M2 MCA stenosis versus artifactual signal loss.This examination does not explain the multiple punctate infarctions, which remain more likely to have originated from the heart or ascending aorta.  Echo ( see report)  TEE pending Past Medical History:  Diagnosis Date  . Arthritis   . Depression    controlled  . Diabetes mellitus without complication ([place])    managed well;   [place] Kitchen GERD (gastroesophageal reflux disease)   . Hepatitis   . HLD (hyperlipidemia)   . Hypertension    somewhat controlled  . 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 Polymyalgia rheumatica ([place])   . Sleep apnea   . Stroke [facility])     Past Surgical History:  Procedure Laterality Date  . ABDOMINAL HYSTERECTOMY    . BILATERAL SALPINGOOPHORECTOMY    . ESOPHAGEAL DILATION    . OOPHORECTOMY    . REDUCTION MAMMAPLASTY Bilateral 2003  . REPLACEMENT TOTAL KNEE Right   . TONSILLECTOMY AND ADENOIDECTOMY      Family History   Problem Relation Age of Onset  . CAD Brother   . Kidney cancer Mother   . Breast cancer Cousin     Social History:  reports that she has never smoked. She has never used smokeless tobacco. She reports that she does not drink alcohol or use drugs.  Allergies  Allergen Reactions  . Iodinated Diagnostic Agents Shortness Of Breath    Only with nuclear stress test per pt Only with nuclear stress test per pt   . Penicillins Rash    Rash at injection site. Pt states she can take Amoxicillin & Keflex   . Escitalopram   . Lithium   . Erythromycin Nausea Only and Nausea And Vomiting    Other reaction(s): Other (See Comments) dehydration     Medications: I have reviewed the patient's current medications.    Physical Examination: Blood pressure (!) 120/52, pulse (!) 43, temperature 97.6 F (36.4 C), temperature source Oral, resp. rate 14, height 5\' 1"  (1.549 m), weight 85.3 kg, SpO2 97 %.  HEENT-  Normocephalic, no lesions, without obvious abnormality.  Normal external eye and conjunctiva.  Normal TM's bilaterally.  Normal auditory canals and external ears.   8/3; Neurological Examination   Mental Status: patient little drwosy ( she just got from her TEE) but easily arousable, oriented, thought content appropriate.  Speech fluent without evidence of aphasia.  Able to follow 3 step commands without difficulty.  Cranial Nerves: IVisual fields grossly normal, pupils equal, round, reactive to light  and accommodation III,IV, VI: ptosis not present, extra-ocular motions intact bilaterally V,VII: smile symmetric, facial light touch sensation normal bilaterally VIII: hearing normal bilaterally IX,X: gag reflex present XI: bilateral shoulder shrug XII: midline tongue extension Motor: Generalized weakness  Tone and bulk:normal tone throughout; no atrophy noted Sensory: Pinprick and light touch intact throughout, bilaterally Deep Tendon Reflexes: 2+ and symmetric  throughout Plantars: Right: downgoing   Left: downgoing Cerebellar: normal finger-to-nose, normal rapid alternating movements and normal heel-to-shin test Gait: not tested  I will re check on patient later on      Laboratory Studies:   Basic Metabolic Panel: Recent Labs  Lab 03/27/19 1804 03/28/19 0638 03/29/19 0540 03/31/19 0542  NA 134* 137 139 139  K 4.1 4.0 3.4* 3.6  CL 98 101 106 109  CO2 24 21* 24 22  GLUCOSE 371* 368* 291* 265*  BUN 40* 38* 39* 37*  CREATININE 2.70* 2.67* 2.44* 2.23*  CALCIUM 9.3 9.4 8.5* 9.0    Liver Function Tests: Recent Labs  Lab 03/27/19 1804  AST 17  ALT 14  ALKPHOS 79  BILITOT 1.1  PROT 7.0  ALBUMIN 3.7   No results for input(s): LIPASE, AMYLASE in the last 168 hours. No results for input(s): AMMONIA in the last 168 hours.  CBC: Recent Labs  Lab 03/27/19 1804 03/28/19 0638 03/29/19 0540  WBC 10.1 10.5 8.8  HGB 13.2 13.1 11.3*  HCT 37.7 37.8 33.0*  MCV 84.0 84.6 85.9  PLT 390 399 315    Cardiac Enzymes: No results for input(s): CKTOTAL, CKMB, CKMBINDEX, TROPONINI in the last 168 hours.  BNP: Invalid input(s): POCBNP  CBG: Recent Labs  Lab 03/30/19 0808 03/30/19 1146 03/30/19 1635 03/30/19 2032 03/31/19 0744  GLUCAP 242* 337* 215* 392* 275*    Microbiology: Results for orders placed or performed during the hospital encounter of 03/27/19  SARS Coronavirus 2 (CEPHEID - Performed in [place] hospital lab), Hosp Order     Status: None   Collection Time: 03/27/19  6:04 PM   Specimen: Nasopharyngeal Swab  Result Value Ref Range Status   SARS Coronavirus 2 NEGATIVE NEGATIVE Final    Comment: (NOTE) If result is NEGATIVE SARS-CoV-2 target nucleic acids are NOT DETECTED. The SARS-CoV-2 RNA is generally detectable in upper and lower  respiratory specimens during the acute phase of infection. The lowest  concentration of SARS-CoV-2 viral copies this assay can detect is 250  copies / mL. A negative result does  not preclude SARS-CoV-2 infection  and should not be used as the sole basis for treatment or other  patient management decisions.  A negative result may occur with  improper specimen collection / handling, submission of specimen other  than nasopharyngeal swab, presence of viral mutation(s) within the  areas targeted by this assay, and inadequate number of viral copies  (<250 copies / mL). A negative result must be combined with clinical  observations, patient history, and epidemiological information. If result is POSITIVE SARS-CoV-2 target nucleic acids are DETECTED. The SARS-CoV-2 RNA is generally detectable in upper and lower  respiratory specimens dur ing the acute phase of infection.  Positive  results are indicative of active infection with SARS-CoV-2.  Clinical  correlation with patient history and other diagnostic information is  necessary to determine patient infection status.  Positive results do  not rule out bacterial infection or co-infection with other viruses. If result is PRESUMPTIVE POSTIVE SARS-CoV-2 nucleic acids MAY BE PRESENT.   A presumptive positive result was obtained on the  submitted specimen  and confirmed on repeat testing.  While 2019 novel coronavirus  (SARS-CoV-2) nucleic acids may be present in the submitted sample  additional confirmatory testing may be necessary for epidemiological  and / or clinical management purposes  to differentiate between  SARS-CoV-2 and other Sarbecovirus currently known to infect humans.  If clinically indicated additional testing with an alternate test  methodology [phone]) is advised. The SARS-CoV-2 RNA is generally  detectable in upper and lower respiratory sp ecimens during the acute  phase of infection. The expected result is Negative. Fact Sheet for Patients:  StrictlyIdeas.no Fact Sheet for Healthcare Providers: BankingDealers.co.za This test is not yet approved or  cleared by the [place] FDA and has been authorized for detection and/or diagnosis of SARS-CoV-2 by FDA under an Emergency Use Authorization (EUA).  This EUA will remain in effect (meaning this test can be used) for the duration of the COVID-19 declaration under Section 564(b)(1) of the Act, 21 U.S.C. section 360bbb-3(b)(1), unless the authorization is terminated or revoked sooner. Performed at [facility], [address] [place], [place] [address]     Coagulation Studies: No results for input(s): LABPROT, INR in the last 72 hours.  Urinalysis:  Recent Labs  Lab 03/27/19 1804  COLORURINE YELLOW*  LABSPEC 1.010  PHURINE 7.0  GLUCOSEU >=500*  HGBUR NEGATIVE  BILIRUBINUR NEGATIVE  KETONESUR 5*  PROTEINUR 100*  NITRITE NEGATIVE  LEUKOCYTESUR NEGATIVE    Lipid Panel:     Component Value Date/Time   CHOL 189 12/20/2015 0513   TRIG 191 (H) 12/20/2015 0513   HDL 41 12/20/2015 0513   CHOLHDL 4.6 12/20/2015 0513   VLDL 38 12/20/2015 0513   LDLCALC 110 (H) 12/20/2015 0513    HgbA1C:  Lab Results  Component Value Date   HGBA1C 6.9 (H) 03/28/2019    Urine Drug Screen:      Component Value Date/Time   LABOPIA NONE DETECTED 12/19/2015 2026   COCAINSCRNUR NONE DETECTED 12/19/2015 2026   LABBENZ NONE DETECTED 12/19/2015 2026   AMPHETMU NONE DETECTED 12/19/2015 2026   THCU NONE DETECTED 12/19/2015 2026   LABBARB NONE DETECTED 12/19/2015 2026    Alcohol Level: No results for input(s): ETH in the last 168 hours.    Imaging: Mr Angio Head Wo Contrast  Result Date: 03/29/2019 CLINICAL DATA:  Multiple infarctions seen yesterday. Punctate bilateral infarctions. EXAM: MRA NECK WITHOUT  CONTRAST MRA HEAD WITHOUT CONTRAST TECHNIQUE: Multiplanar and multiecho pulse sequences of the neck were obtained without intravenous contrast. Angiographic images of the neck were obtained using MRA technique without intravenous contast.; Angiographic images of the [place] were obtained using MRA technique without intravenous contrast. COMPARISON:  None. FINDINGS: All exams suffer from motion degradation. MRA NECK FINDINGS Both common carotid arteries are patent to the bifurcations. Both carotid bifurcations are patent. No suspicion proximal ICA stenosis. Both vertebral arteries show antegrade flow. MRA HEAD FINDINGS Both internal carotid arteries are patent through the siphon regions. There is siphon atherosclerotic narrowing. The anterior and middle cerebral vessels are patent. Diminutive A1 segment on the right. The anterior cerebral arteries receive most of there supply from the left carotid circulation. Possible left M2 stenosis. This could be artifactual. Both vertebral arteries are patent to the basilar. No basilar stenosis. Both posterior cerebral arteries show flow. IMPRESSION: Motion degraded exam. No suspicion of carotid bifurcation stenosis. Antegrade flow in both vertebral arteries. Atherosclerotic narrowing in the carotid siphon regions. Possible left M2 MCA stenosis versus artifactual signal loss.  This examination does not explain the multiple punctate infarctions, which remain more likely to have originated from the heart or ascending aorta. Electronically Signed   By: Nelson Chimes M.D.   On: 03/29/2019 20:26   Mr Angio Neck Wo Contrast  Result Date: 03/29/2019 CLINICAL DATA:  Multiple infarctions seen yesterday. Punctate bilateral infarctions. EXAM: MRA NECK WITHOUT  CONTRAST MRA HEAD WITHOUT CONTRAST TECHNIQUE: Multiplanar and multiecho pulse sequences of the neck were obtained without intravenous contrast. Angiographic images of the neck were obtained using MRA technique without intravenous contast.; Angiographic images of the Circle of [place] were obtained using MRA technique without intravenous contrast. COMPARISON:  None. FINDINGS: All exams suffer from motion degradation. MRA NECK FINDINGS Both common carotid arteries are patent to the bifurcations.  Both carotid bifurcations are patent. No suspicion proximal ICA stenosis. Both vertebral arteries show antegrade flow. MRA HEAD FINDINGS Both internal carotid arteries are patent through the siphon regions. There is siphon atherosclerotic narrowing. The anterior and middle cerebral vessels are patent. Diminutive A1 segment on the right. The anterior cerebral arteries receive most of there supply from the left carotid circulation. Possible left M2 stenosis. This could be artifactual. Both vertebral arteries are patent to the basilar. No basilar stenosis. Both posterior cerebral arteries show flow. IMPRESSION: Motion degraded exam. No suspicion of carotid bifurcation stenosis. Antegrade flow in both vertebral arteries. Atherosclerotic narrowing in the carotid siphon regions. Possible left M2 MCA stenosis versus artifactual signal loss. This examination does not explain the multiple punctate infarctions, which remain more likely to have originated from the heart or ascending aorta. Electronically Signed   By: Nelson Chimes M.D.   On: 03/29/2019 20:26     Assessment/Plan:  79 y.o. female known history of prior CVA, hypertension, hyperlipidemia, diabetes mellitus, depression, GERD, and arthritis.  She presented to the emergency room via EMS services reporting progressively worsening fatigue and weakness over the last 4 days.  She reports she has been becoming weaker over the last 4 days however she became unable to ambulate today.   Patient also has a history of CKD.  On arrival BUN is 40 with creatinine 2.70.  She has elevated troponin of 82 which is increased from 71.  EKG shows no significant changes.  CT head was also completed with no acute intracranial abnormalities.  Glucose on arrival is 400. Pt is found to have embolic strokes on MRI bilaterally. MRA: No suspicion of carotid bifurcation stenosis.Antegrade flow in both vertebral arteries.Atherosclerotic narrowing in the carotid siphon regions. Possibleleft  M2 MCA stenosis versus artifactual signal loss.This examination does not explain the multiple punctate infarctions,which remain more likely to have originated from the heart orascending aorta. TEE results  Pending.  Continue neuro protective measures including normothermia, normoglycemia, crredt electrolytes/metabolic abnliites. Continue ASA/Statin.  PT/OT.  03/31/2019, 10:21 AM

## 2019-03-31 NOTE — CV Procedure (Signed)
Transesophageal echocardiogram preliminary report  Holly Conner [id number] [birth year]  Preliminary diagnosis  Stroke with possible embolic source  Postprocedural diagnosis  Normal LV systolic function no apparent source of embolus but some mild spontaneous contrast of LA without thrombus and may be likely due to bradycardia  Time out A timeout was performed by the nursing staff and physicians specifically identifying the procedure performed, identification of the patient, the type of sedation, all allergies and medications, all pertinent medical history, and presedation assessment of nasopharynx. The patient and or family understand the risks of the procedure including the rare risks of death, stroke, heart attack, esophogeal perforation, sore throat, and reaction to medications given.  Moderate sedation During this procedure the patient has received Versed 2 milligrams and fentanyl 75 micrograms to achieve appropriate moderate sedation.  The patient had continued monitoring of heart rate, oxygenation, blood pressure, respiratory rate, and extent of signs of sedation throughout the entire procedure.  The patient received this moderate sedation over a period of 11 minutes.  Both the nursing staff and I were present during the procedure when the patient had moderate sedation for 100% of the time.  Treatment considerations  No further cardiac intervention due to no apparent cardiac source of embolus  For further details of transesophageal echocardiogram please refer to final report.  Signed,  Corey Skains M.D. [facility] 03/31/2019 9:24 AM

## 2019-03-31 NOTE — Progress Notes (Signed)
Patient post TEE per Dr Darla Lesches well. Sinus bradycardia per monitor. Report called to Amy RN on telemetry with questions answered.

## 2019-03-31 NOTE — Progress Notes (Signed)
Pt attempting to get out of bed multiple times to use the bedside commode and doesn't always remember to use her call bell. Pt only urinating about 200 mL each time and had >300 mL remaining after a bladder scan. Notified NP Gardiner Barefoot earlier in the shift to discuss and patient started on Flomax daily at bedtime. Tele-sitter d/c'd and 1:1 sitter reinstated due to increased impulsivity and intermittent confusion. Nursing staff will continue to monitor for any changes in patient status. Earleen Reaper, RN

## 2019-03-31 NOTE — Progress Notes (Signed)
Inpatient Diabetes Program Recommendations  AACE/ADA: New Consensus Statement on Inpatient Glycemic Control   Target Ranges:  Prepandial:   less than 140 mg/dL      Peak postprandial:   less than 180 mg/dL (1-2 hours)      Critically ill patients:  140 - 180 mg/dL   Results for Holly Conner, Holly Conner (MRN [id number]) as of 03/31/2019 10:07  Ref. Range 03/30/2019 08:08 03/30/2019 11:46 03/30/2019 16:35 03/30/2019 20:32 03/31/2019 07:44  Glucose-Capillary Latest Ref Range: 70 - 99 mg/dL 242 (H) 337 (H) 215 (H) 392 (H) 275 (H)  Results for Holly Conner, Holly Conner (MRN [id number]) as of 03/31/2019 10:07  Ref. Range 03/28/2019 06:38  Hemoglobin A1C Latest Ref Range: 4.8 - 5.6 % 6.9 (H)   Review of Glycemic Control  Diabetes history: DM2 Outpatient Diabetes medications: Tresiba 22 units daily, Novolog 7-9 units TID with meals, Victoza 1.2 mg daily Current orders for Inpatient glycemic control: Lantus 30 units daily, Novolog 0-15 units TID with meals, Novolog 0-5 units QHS  Inpatient Diabetes Program Recommendations:   Insulin-Basal: Please consider increasing Lantus to 35 units daily.  Insulin-Meal Coverage: When diet resumed, please consider ordering Novolog 5 units TID with meals for meal coverage if patient eats at least 50% of meals.  Thanks, Holly Alderman, RN, MSN, CDE Diabetes Coordinator Inpatient Diabetes Program [phone] (Team Pager from 8am to 5pm)

## 2019-03-31 NOTE — TOC Progression Note (Signed)
Transition of Care [facility]) - Progression Note    Patient Details  Name: Holly Conner MRN: [id number] Date of Birth: 1941/06/30  Transition [facility] CM/SW Contact  Ross Ludwig, [place] Phone Number: 03/31/2019, 5:24 PM  Clinical Narrative:     Patient will be receiving home health PT, RN, Aide, Social work, and OT.  CSW contacted [facility] who are able to accept patient.  CSW to continue to follow patient's progress throughout discharge planning.        Expected Discharge Plan and Services           Expected Discharge Date: 03/31/19                                     Social Determinants of Health (SDOH) Interventions    Readmission Risk Interventions No flowsheet data found.

## 2019-03-31 NOTE — Progress Notes (Addendum)
Nutrition Follow Up Note   DOCUMENTATION CODES:   Obesity unspecified  INTERVENTION:   Ensure Max protein supplement BID, each supplement provides 150kcal and 30g of protein.  MVI daily   Bowel regimen as needed per MD  NUTRITION DIAGNOSIS:   Inadequate oral intake related to acute illness as evidenced by per patient/family report.  GOAL:   Patient will meet greater than or equal to 90% of their needs  -progressing   MONITOR:   PO intake, Supplement acceptance, Labs, Weight trends, Skin, I & O's  ASSESSMENT:   78 y.o. female with a past medical history of arthritis, depression, diabetes, gastric reflux, hypertension, hyperlipidemia, prior CVA, presents to the emergency department for generalized fatigue/weakness. Pt found to have embolic strokes on MRI bilaterally  Pt continues to have good appetite and oral intake; pt eating 100% meals and drinking some Ensure. Per chart, pt remains weight stable since admit. No BM noted since 7/30; recommend bowel regimen as needed per MD.  Medications reviewed and include: aspirin, heparin, insulin, B12, MVI  Labs reviewed: BUN 37(H), creat 2.23(H) cbgs- 275, 177, 313 x 24 hrs  Diet Order:   Diet Order            Diet Carb Modified Fluid consistency: Thin; Room service appropriate? Yes  Diet effective now        Diet - low sodium heart healthy             EDUCATION NEEDS:   Education needs have been addressed  Skin:  Skin Assessment: Reviewed RN Assessment  Last BM:  7/30  Height:   Ht Readings from Last 1 Encounters:  03/31/19 5\' 1"  (1.549 m)    Weight:   Wt Readings from Last 1 Encounters:  03/31/19 85.3 kg    Ideal Body Weight:  47.7 kg  BMI:  Body mass index is 35.53 kg/m.  Estimated Nutritional Needs:   Kcal:  1600-1800kcal/day  Protein:  80-90g/day  Fluid:  >1.4L/day  Koleen Distance MS, RD, LDN Pager #- [phone] Office#- [phone] After Hours Pager: [phone]

## 2019-03-31 NOTE — Progress Notes (Signed)
*  PRELIMINARY RESULTS* Echocardiogram Echocardiogram Transesophageal has been performed.  Holly Conner 03/31/2019, 9:50 AM

## 2019-03-31 NOTE — Consult Note (Signed)
[facility] Consultation Note  Patient ID: Holly Conner, MRN: [id number], DOB/AGE: 80-May-1942 80 y.o. Admit date: 03/27/2019   Date of Consult: 03/31/2019 Primary Physician: Dion Body, MD Primary Cardiologist: None  Chief Complaint:  Chief Complaint  Patient presents with  . Fatigue  . Failure To Thrive   Reason for Consult: TIA stroke with cardiovascular risk factors  HPI: 78 y.o. female with known diabetes hypertension hyperlipidemia sleep apnea and apparent generalized weakness concerning for stroke.  CT scan shows no evidence of primary stroke at this time but there has been concerns of this.  The patient has been on appropriate antihypertensive medication management working quite well at this time.  There is also high intensity cholesterol therapy 2.  The patient has had medication management including propranolol causing some bradycardia which could be causing some weakness and fatigue as well as other side effects of medications.  Diabetes has been reasonably controlled.  The patient currently has not had any specific signs or symptoms of stroke today.  Patient has had medication management including aspirin for further risk reduction cardiovascular event. Transesophageal echocardiogram has shown normal LV systolic function with ejection fraction of 55% with no evidence of significant valvular heart disease or primary cause of stroke or TIA.  There is no patent foramen ovale or atrial septal defect.  The patient does have some spontaneous contrast of the left atrium although there is no left atrial appendage thrombus.  This spontaneous contrast may be due to bradycardia.  Past Medical History:  Diagnosis Date  . Arthritis   . Depression    controlled  . Diabetes mellitus without complication ([place])    managed well;   Marland Kitchen GERD (gastroesophageal reflux disease)   . Hepatitis   . HLD (hyperlipidemia)   . Hypertension    somewhat controlled  . 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 Polymyalgia rheumatica ([place])   . Sleep apnea   . Stroke [facility])       Surgical History:  Past Surgical History:  Procedure Laterality Date  . ABDOMINAL HYSTERECTOMY    . BILATERAL SALPINGOOPHORECTOMY    . ESOPHAGEAL DILATION    . OOPHORECTOMY    . REDUCTION MAMMAPLASTY Bilateral 2003  . REPLACEMENT TOTAL KNEE Right   . TONSILLECTOMY AND ADENOIDECTOMY       Home Meds: Prior to Admission medications   Medication Sig Start Date End Date Taking? Authorizing Provider  Ascorbic Acid (VITAMIN C) 1000 MG tablet Take 1,000 mg by mouth daily.   Yes [provider]  atorvastatin (LIPITOR) 80 MG tablet Take 80 mg by mouth daily.    Yes [provider]  benazepril (LOTENSIN) 40 MG tablet Take 40 mg by mouth 2 (two) times daily.    Yes [provider]  Cyanocobalamin (VITAMIN B-12 PO) Take by mouth.   Yes [provider]  insulin aspart (NOVOLOG FLEXPEN) 100 UNIT/ML FlexPen Inject 7-9 Units into the skin 3 (three) times daily with meals.  12/14/15  Yes [provider]  Insulin Degludec (TRESIBA FLEXTOUCH) 200 UNIT/ML SOPN Inject 22 Units into the skin daily.  04/03/16  Yes [provider]  liraglutide (VICTOZA) 18 MG/3ML SOPN Victoza 3-Pak 0.6 mg/0.1 mL (18 mg/3 mL) subcutaneous pen injector 09/13/15  Yes [provider]  loratadine (CLARITIN) 10 MG tablet Take 10 mg by mouth daily as needed for allergies.   Yes [provider]  losartan (COZAAR) 50 MG tablet Take 50 mg  by mouth daily.   Yes [provider]  propranolol (INDERAL) 20 MG tablet Take 60 mg by mouth daily.    Yes [provider]  pyridOXINE (VITAMIN B-6) 100 MG tablet Take 100 mg by mouth daily.   Yes [provider]  solifenacin (VESICARE) 10 MG tablet Take 1 tablet (10 mg total) by mouth daily. 10/11/18  Yes Stoioff, Ronda Fairly, MD    Inpatient Medications:  . amLODipine  10 mg Oral Daily  . aspirin  EC  325 mg Oral Daily  . atorvastatin  80 mg Oral Daily  . butamben-tetracaine-benzocaine      . darifenacin  15 mg Oral Daily  . fentaNYL      . heparin injection (subcutaneous)  5,000 Units Subcutaneous Q8H  . hydrALAZINE  10 mg Oral Q8H  . insulin aspart  0-15 Units Subcutaneous TID WC  . insulin aspart  0-5 Units Subcutaneous QHS  . insulin glargine  30 Units Subcutaneous Daily  . lidocaine      . midazolam      . multivitamin with minerals  1 tablet Oral Daily  . nitroGLYCERIN  0.5 inch Topical Q6H  . propranolol  60 mg Oral Daily  . Ensure Max Protein  11 oz Oral BID  . pyridOXINE  100 mg Oral Daily  . sodium chloride flush  3 mL Intravenous Q12H  . sodium chloride flush      . tamsulosin  0.4 mg Oral QHS  . vitamin B-12  500 mcg Oral Daily   . sodium chloride 20 mL/hr at 03/31/19 0513    Allergies:  Allergies  Allergen Reactions  . Iodinated Diagnostic Agents Shortness Of Breath    Only with nuclear stress test per pt Only with nuclear stress test per pt   . Penicillins Rash    Rash at injection site. Pt states she can take Amoxicillin & Keflex   . Escitalopram   . Lithium   . Erythromycin Nausea Only and Nausea And Vomiting    Other reaction(s): Other (See Comments) dehydration     Social History   Socioeconomic History  . Marital status: Single    Spouse name: Not on file  . Number of children: Not on file  . Years of education: Not on file  . Highest education level: Not on file  Occupational History  . Not on file  Social Needs  . Financial resource strain: Not on file  . Food insecurity    Worry: Not on file    Inability: Not on file  . Transportation needs    Medical: Not on file    Non-medical: Not on file  Tobacco Use  . Smoking status: Never Smoker  . Smokeless tobacco: Never Used  Substance and Sexual Activity  . Alcohol use: No    Alcohol/week: 0.0 standard drinks  . Drug use: No  . Sexual activity: Yes    Birth  control/protection: Surgical  Lifestyle  . Physical activity    Days per week: Not on file    Minutes per session: Not on file  . Stress: Not on file  Relationships  . Social Herbalist on phone: Not on file    Gets together: Not on file    Attends religious service: Not on file    Active member of club or organization: Not on file    Attends meetings of clubs or organizations: Not on file    Relationship status: Not on file  .  Intimate partner violence    Fear of current or ex partner: Not on file    Emotionally abused: Not on file    Physically abused: Not on file    Forced sexual activity: Not on file  Other Topics Concern  . Not on file  Social History Narrative  . Not on file     Family History  Problem Relation Age of Onset  . CAD Brother   . Kidney cancer Mother   . Breast cancer Cousin      Review of Systems Positive for weakness Negative for: General:  chills, fever, night sweats or weight changes.  Cardiovascular: PND orthopnea syncope dizziness  Dermatological skin lesions rashes Respiratory: Cough congestion Urologic: Frequent urination urination at night and hematuria Abdominal: negative for nausea, vomiting, diarrhea, bright red blood per rectum, melena, or hematemesis Neurologic: negative for visual changes, and/or hearing changes  All other systems reviewed and are otherwise negative except as noted above.  Labs: No results for input(s): CKTOTAL, CKMB, TROPONINI in the last 72 hours. Lab Results  Component Value Date   WBC 8.8 03/29/2019   HGB 11.3 (L) 03/29/2019   HCT 33.0 (L) 03/29/2019   MCV 85.9 03/29/2019   PLT 315 03/29/2019    Recent Labs  Lab 03/27/19 1804  03/31/19 0542  NA 134*   < > 139  K 4.1   < > 3.6  CL 98   < > 109  CO2 24   < > 22  BUN 40*   < > 37*  CREATININE 2.70*   < > 2.23*  CALCIUM 9.3   < > 9.0  PROT 7.0  --   --   BILITOT 1.1  --   --   ALKPHOS 79  --   --   ALT 14  --   --   AST 17  --   --    GLUCOSE 371*   < > 265*   < > = values in this interval not displayed.   Lab Results  Component Value Date   CHOL 189 12/20/2015   HDL 41 12/20/2015   LDLCALC 110 (H) 12/20/2015   TRIG 191 (H) 12/20/2015   No results found for: DDIMER  Radiology/Studies:  Ct Head Wo Contrast  Result Date: 03/27/2019 CLINICAL DATA:  78 year old female with acute weakness. EXAM: CT HEAD WITHOUT CONTRAST TECHNIQUE: Contiguous axial images were obtained from the base of the skull through the vertex without intravenous contrast. COMPARISON:  12/18/2018 CT and prior studies FINDINGS: Brain: No evidence of acute infarction, hemorrhage, hydrocephalus, extra-axial collection or mass lesion/mass effect. Atrophy and chronic small-vessel white matter ischemic changes again noted. Vascular: Mild carotid atherosclerotic calcifications again noted. Skull: Normal. Negative for fracture or focal lesion. Sinuses/Orbits: No acute finding. Other: None. IMPRESSION: 1. No evidence of acute intracranial abnormality. 2. Atrophy and chronic small-vessel white matter ischemic changes. Electronically Signed   By: Margarette Canada M.D.   On: 03/27/2019 19:54   Mr Angio Head Wo Contrast  Result Date: 03/29/2019 CLINICAL DATA:  Multiple infarctions seen yesterday. Punctate bilateral infarctions. EXAM: MRA NECK WITHOUT  CONTRAST MRA HEAD WITHOUT CONTRAST TECHNIQUE: Multiplanar and multiecho pulse sequences of the neck were obtained without intravenous contrast. Angiographic images of the neck were obtained using MRA technique without intravenous contast.; Angiographic images of the Circle of Willis were obtained using MRA technique without intravenous contrast. COMPARISON:  None. FINDINGS: All exams suffer from motion degradation. MRA NECK FINDINGS Both common carotid arteries are patent  to the bifurcations. Both carotid bifurcations are patent. No suspicion proximal ICA stenosis. Both vertebral arteries show antegrade flow. MRA HEAD FINDINGS Both  internal carotid arteries are patent through the siphon regions. There is siphon atherosclerotic narrowing. The anterior and middle cerebral vessels are patent. Diminutive A1 segment on the right. The anterior cerebral arteries receive most of there supply from the left carotid circulation. Possible left M2 stenosis. This could be artifactual. Both vertebral arteries are patent to the basilar. No basilar stenosis. Both posterior cerebral arteries show flow. IMPRESSION: Motion degraded exam. No suspicion of carotid bifurcation stenosis. Antegrade flow in both vertebral arteries. Atherosclerotic narrowing in the carotid siphon regions. Possible left M2 MCA stenosis versus artifactual signal loss. This examination does not explain the multiple punctate infarctions, which remain more likely to have originated from the heart or ascending aorta. Electronically Signed   By: Nelson Chimes M.D.   On: 03/29/2019 20:26   Mr Angio Neck Wo Contrast  Result Date: 03/29/2019 CLINICAL DATA:  Multiple infarctions seen yesterday. Punctate bilateral infarctions. EXAM: MRA NECK WITHOUT  CONTRAST MRA HEAD WITHOUT CONTRAST TECHNIQUE: Multiplanar and multiecho pulse sequences of the neck were obtained without intravenous contrast. Angiographic images of the neck were obtained using MRA technique without intravenous contast.; Angiographic images of the Circle of Willis were obtained using MRA technique without intravenous contrast. COMPARISON:  None. FINDINGS: All exams suffer from motion degradation. MRA NECK FINDINGS Both common carotid arteries are patent to the bifurcations. Both carotid bifurcations are patent. No suspicion proximal ICA stenosis. Both vertebral arteries show antegrade flow. MRA HEAD FINDINGS Both internal carotid arteries are patent through the siphon regions. There is siphon atherosclerotic narrowing. The anterior and middle cerebral vessels are patent. Diminutive A1 segment on the right. The anterior cerebral  arteries receive most of there supply from the left carotid circulation. Possible left M2 stenosis. This could be artifactual. Both vertebral arteries are patent to the basilar. No basilar stenosis. Both posterior cerebral arteries show flow. IMPRESSION: Motion degraded exam. No suspicion of carotid bifurcation stenosis. Antegrade flow in both vertebral arteries. Atherosclerotic narrowing in the carotid siphon regions. Possible left M2 MCA stenosis versus artifactual signal loss. This examination does not explain the multiple punctate infarctions, which remain more likely to have originated from the heart or ascending aorta. Electronically Signed   By: Nelson Chimes M.D.   On: 03/29/2019 20:26   Mr Brain Wo Contrast  Result Date: 03/28/2019 CLINICAL DATA:  Focal neuro deficit. Increasing fatigue and weakness 4 days. EXAM: MRI HEAD WITHOUT CONTRAST TECHNIQUE: Multiplanar, multiecho pulse sequences of the brain and surrounding structures were obtained without intravenous contrast. COMPARISON:  MRI head 12/20/2015 FINDINGS: Brain: Multiple small areas of acute infarct involving the left posterior temporal lobe and left inferior occipital parietal white matter. Small acute infarct left putamen and right internal capsule anteriorly. Small acute infarct in the periventricular white matter of the right frontal and right parietal lobe. Small acute infarct left parietal white matter. Generalized atrophy, moderate. Mild chronic microvascular ischemic change in the white matter. Chronic microhemorrhage in the thalamus bilaterally, not seen previously. T1 hyperintensity left putamen not seen previously and likely due to mineralization Vascular: Normal arterial flow voids Skull and upper cervical spine: Negative Sinuses/Orbits: Negative Other: None IMPRESSION: Multiple scattered small areas of infarct in both cerebral hemispheres. Possible emboli. Possible hypercoagulability. Microhemorrhage in the thalamus bilaterally peers  chronic but not present in 2017. Atrophy and chronic ischemic changes in the white matter. Electronically Signed  By: Franchot Gallo M.D.   On: 03/28/2019 13:07   US Carotid Bilateral (at [facility] And Ap Only)  Result Date: 03/28/2019 CLINICAL DATA:  78 year old female with aphasia EXAM: BILATERAL CAROTID DUPLEX ULTRASOUND TECHNIQUE: Pearline Cables scale imaging, color Doppler and duplex ultrasound were performed of bilateral carotid and vertebral arteries in the neck. COMPARISON:  Prior duplex carotid ultrasound 12/20/2015 and 10/06/2010 FINDINGS: Criteria: Quantification of carotid stenosis is based on velocity parameters that correlate the residual internal carotid diameter with NASCET-based stenosis levels, using the diameter of the distal internal carotid lumen as the denominator for stenosis measurement. The following velocity measurements were obtained: RIGHT ICA: 81/22 cm/sec CCA: 29/7 cm/sec SYSTOLIC ICA/CCA RATIO:  0.9 ECA:  150 cm/sec LEFT ICA: 78/22 cm/sec CCA: 98/92 cm/sec SYSTOLIC ICA/CCA RATIO:  1.0 ECA:  126 cm/sec RIGHT CAROTID ARTERY: Stable mild heterogeneous atherosclerotic plaque in the proximal internal carotid artery. By peak systolic velocity criteria, the estimated stenosis remains less than 50%. RIGHT VERTEBRAL ARTERY:  Patent with normal antegrade flow. LEFT CAROTID ARTERY: Mild focal heterogeneous atherosclerotic plaque in the proximal internal carotid artery. By peak systolic velocity criteria, the estimated stenosis remains less than 50%. LEFT VERTEBRAL ARTERY:  Patent with normal antegrade flow. IMPRESSION: 1. No significant interval progression of mild bilateral internal carotid artery disease compared to 12/20/2015. 2. Mild (1-49%) stenosis proximal right internal carotid artery secondary to focal heterogeneous atherosclerotic plaque. 3. Mild (1-49%) stenosis proximal left internal carotid artery secondary to focal heterogeneous atherosclerotic plaque. 4. Vertebral arteries remain patent with  normal antegrade flow. Signed, Criselda Peaches, MD, [facility] Electronically Signed   By: Jacqulynn Cadet M.D.   On: 03/28/2019 09:57   Dg Chest Portable 1 View  Result Date: 03/27/2019 CLINICAL DATA:  Weakness for several days. EXAM: PORTABLE CHEST 1 VIEW COMPARISON:  PA and lateral chest 12/19/2018 and full 12/19/2015. FINDINGS: Lungs are clear. There is cardiomegaly. No pneumothorax or pleural fluid. No acute or focal bony abnormality. IMPRESSION: Cardiomegaly without acute disease. Electronically Signed   By: Inge Rise M.D.   On: 03/27/2019 18:14    EKG: Normal sinus rhythm  Weights: Filed Weights   03/30/19 0033 03/31/19 0500 03/31/19 0843  Weight: 86.1 kg 85.3 kg 85.3 kg     Physical Exam: Blood pressure (!) 83/44, pulse (!) 43, temperature (!) 97.5 F (36.4 C), temperature source Oral, resp. rate 13, height 5\' 1"  (1.549 m), weight 85.3 kg, SpO2 96 %. Body mass index is 35.53 kg/m. General: Well developed, well nourished, in no acute distress. Head eyes ears nose throat: Normocephalic, atraumatic, sclera non-icteric, no xanthomas, nares are without discharge. No apparent thyromegaly and/or mass  Lungs: Normal respiratory effort.  no wheezes, no rales, no rhonchi.  Heart: RRR with normal S1 S2. no murmur gallop, no rub, PMI is normal size and placement, carotid upstroke normal without bruit, jugular venous pressure is normal Abdomen: Soft, non-tender, non-distended with normoactive bowel sounds. No hepatomegaly. No rebound/guarding. No obvious abdominal masses. Abdominal aorta is normal size without bruit Extremities: No edema. no cyanosis, no clubbing, no ulcers  Peripheral : 2+ bilateral upper extremity pulses, 2+ bilateral femoral pulses, 2+ bilateral dorsal pedal pulse Neuro: Alert and oriented. No facial asymmetry. No focal deficit. Moves all extremities spontaneously. Musculoskeletal: Normal muscle  tone without kyphosis Psych:  Responds to questions appropriately with a normal affect.    Assessment: 78 year old female with diabetes hypertension hyperlipidemia having a TIA and weakness and fatigue strokelike symptoms  without evidence of CT scan changes and no primary source of embolus from the cardiac standpoint with overall transesophageal echocardiogram only showing spontaneous contrast without thrombus of the left atrium and minimal aortic atherosclerosis  Plan: 1.  Continue supportive care for weakness and fatigue and no further cardiac interventions at this time 2.  Continue hypertension medication management for goal systolic blood pressure below 130 mm for further risk reduction cardiovascular event 3.  High intensity cholesterol therapy 4.  Aspirin for further risk reduction and cardiovascular event without need in anticoagulation at this time 5.  Begin ambulation and follow for improvements of symptoms and follow-up in 1 to 2 weeks  Signed, Corey Skains M.D. [facility] 03/31/2019, 9:26 AM

## 2019-03-31 NOTE — Progress Notes (Signed)
OT Cancellation Note  Patient Details Name: Chavonne Sforza MRN: [id number] DOB: [birth year]   Cancelled Treatment:    Reason Eval/Treat Not Completed: Other (comment). Pt with nursing for care. Will re-attempt at later date/time as pt is available and medically appropriate.   Jeni Salles, MPH, MS, OTR/L ascom [phone] 03/31/19, 2:01 PM

## 2019-04-01 LAB — BASIC METABOLIC PANEL
Anion gap: 9 (ref 5–15)
BUN: 37 mg/dL — ABNORMAL HIGH (ref 8–23)
CO2: 21 mmol/L — ABNORMAL LOW (ref 22–32)
Calcium: 9 mg/dL (ref 8.9–10.3)
Chloride: 106 mmol/L (ref 98–111)
Creatinine, Ser: 2.32 mg/dL — ABNORMAL HIGH (ref 0.44–1.00)
GFR calc Af Amer: 22 mL/min — ABNORMAL LOW (ref >60)
GFR calc non Af Amer: 19 mL/min — ABNORMAL LOW (ref >60)
Glucose, Bld: 211 mg/dL — ABNORMAL HIGH (ref 70–99)
Potassium: 3.5 mmol/L (ref 3.5–5.1)
Sodium: 136 mmol/L (ref 135–145)

## 2019-04-01 LAB — CBC
HCT: 34.7 % — ABNORMAL LOW (ref 36.0–46.0)
Hemoglobin: 12.2 g/dL (ref 12.0–15.0)
MCH: 30 pg (ref 26.0–34.0)
MCHC: 35.2 g/dL (ref 30.0–36.0)
MCV: 85.3 fL (ref 80.0–100.0)
Platelets: 298 10*3/uL (ref 150–400)
RBC: 4.07 MIL/uL (ref 3.87–5.11)
RDW: 13.4 % (ref 11.5–15.5)
WBC: 10.4 10*3/uL (ref 4.0–10.5)
nRBC: 0 % (ref 0.0–0.2)

## 2019-04-01 LAB — GLUCOSE, CAPILLARY
Glucose-Capillary: 215 mg/dL — ABNORMAL HIGH (ref 70–99)
Glucose-Capillary: 369 mg/dL — ABNORMAL HIGH (ref 70–99)

## 2019-04-01 MED ORDER — TAMSULOSIN HCL 0.4 MG PO CAPS: 0.4000 mg | ORAL_CAPSULE | Freq: Every day | ORAL | 0 refills | Status: AC

## 2019-04-01 MED ORDER — AMLODIPINE BESYLATE 10 MG PO TABS: 10.0000 mg | ORAL_TABLET | Freq: Every day | ORAL | 0 refills | Status: DC

## 2019-04-01 MED ORDER — ADULT MULTIVITAMIN W/MINERALS CH: 1.0000 | ORAL_TABLET | Freq: Every day | ORAL | 0 refills | Status: AC

## 2019-04-01 MED ORDER — ASPIRIN EC 81 MG PO TBEC: 81.0000 mg | DELAYED_RELEASE_TABLET | Freq: Every day | ORAL | 2 refills | Status: AC

## 2019-04-01 MED ORDER — TRESIBA FLEXTOUCH 200 UNIT/ML ~~LOC~~ SOPN: 30.0000 [IU] | PEN_INJECTOR | Freq: Every day | SUBCUTANEOUS | 0 refills | Status: DC

## 2019-04-01 NOTE — Progress Notes (Signed)
Occupational Therapy Treatment Patient Details Name: Holly Conner MRN: [id number] DOB: [birth year] Today's Date: 04/01/2019    History of present illness From MD progress note 03/28/19:  Pt is an 78 year old female with history of CVA, hypertension, hyperlipidemia, type 2 diabetes mellitus, GERD, arthritis, chronic kidney disease stage III currently in the hospital under hospitalist service for generalized weakness.  Assessment includes: Acute on chronic kidney disease stage III, embolic CVA, DM, HLD, h/o CVA, dehydration, and ambulatory dysfunction.   OT comments  Patient seen for brief session since she was preparing to go home.  Reviewed pacing for ADLs prior to going home today and reviewed safe set up with rec to remove throw rugs.  She has a cane and RW and needed reminders to use the RW for blance and safety at home.  Reviewed safe functional mobility with RW and to not reach for commode or bed since this increases risk of falls.   Follow Up Recommendations  Home health OT;Supervision - Intermittent    Equipment Recommendations       Recommendations for Other Services      Precautions / Restrictions Precautions Precautions: Fall Restrictions Weight Bearing Restrictions: No       Mobility Bed Mobility                  Transfers                      Balance                                           ADL either performed or assessed with clinical judgement   ADL                                         General ADL Comments: Reviewed pacing for ADLs prior to going home today and reviewed safe set up with rec to remove throw rugs.  She has a cane and RW and needed reminders to use the RW for blance and safety at home.  Reviewed safe functional mobility with RW and to not reach for commode or bed since this increases risk of falls.     Vision Baseline Vision/History: Wears glasses Wears Glasses: Reading only Patient  Visual Report: No change from baseline     Perception     Praxis      Cognition Arousal/Alertness: Awake/alert Behavior During Therapy: Anxious;WFL for tasks assessed/performed Overall Cognitive Status: No family/caregiver present to determine baseline cognitive functioning                                          Exercises     Shoulder Instructions       General Comments      Pertinent Vitals/ Pain          Home Living                                          Prior Functioning/Environment              Frequency  Min 2X/week  Progress Toward Goals  OT Goals(current goals can now be found in the care plan section)  Progress towards OT goals: Progressing toward goals  Acute Rehab OT Goals Patient Stated Goal: To be able to take care of myself again! OT Goal Formulation: With patient Time For Goal Achievement: 04/12/19 Potential to Achieve Goals: Good  Plan Discharge plan remains appropriate    Co-evaluation                 AM-PAC OT "6 Clicks" Daily Activity     Outcome Measure   Help from another person eating meals?: None Help from another person taking care of personal grooming?: None Help from another person toileting, which includes using toliet, bedpan, or urinal?: A Little Help from another person bathing (including washing, rinsing, drying)?: A Little Help from another person to put on and taking off regular upper body clothing?: None Help from another person to put on and taking off regular lower body clothing?: A Little 6 Click Score: 21    End of Session    OT Visit Diagnosis: Unsteadiness on feet (R26.81);Repeated falls (R29.6);Muscle weakness (generalized) (M62.81)   Activity Tolerance Patient tolerated treatment well   Patient Left in bed;with call bell/phone within reach;with bed alarm set;with nursing/sitter in room   Nurse Communication          Time: 1638-4665 OT Time  Calculation (min): 15 min  Charges: OT General Charges $OT Visit: 1 Visit OT Treatments $Self Care/Home Management : 8-22 mins  Chrys Racer, OTR/L, [place] ascom [phone] 04/01/19, 3:46 PM

## 2019-04-01 NOTE — Discharge Summary (Signed)
[facility] [place] NAME: Holly Conner    MR#:  [id number]  DATE OF BIRTH:  07-07-1941  DATE OF ADMISSION:  03/27/2019 ADMITTING PHYSICIAN: Christel Mormon, MD  DATE OF DISCHARGE: 04/01/2019   PRIMARY CARE PHYSICIAN: Dion Body, MD    ADMISSION DIAGNOSIS:  Weakness [R53.1]  DISCHARGE DIAGNOSIS:  Active Problems:   Weakness generalized    stroke SECONDARY DIAGNOSIS:   Past Medical History:  Diagnosis Date  . Arthritis   . Depression    controlled  . Diabetes mellitus without complication ([place])    managed well;   Marland Kitchen GERD (gastroesophageal reflux disease)   . Hepatitis   . HLD (hyperlipidemia)   . Hypertension    somewhat controlled  . 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 Polymyalgia rheumatica ([place])   . Sleep apnea   . 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 IV fluids Monitor renal function- improving.  -Embolic CVA Started aspirin Continue statin medication Neurology consult appreciated with Echocardiogram reviewed , no clear source. Cardiology consult for transesophageal echo-done, no source of emboli. Found plaque in ascending aorta. MRA brain done. No clear source of emboli.  -Altered mental status This could be combination of hospital delirium and use of anesthetic meds during the procedure. I spoke to her friend and POA who lives with her, as per her- she is having some baseline dementia, forgetfulness. They have to take care of giving her meds and taking her to places due to that. Pt seems to be at baseline now.  -Uncontrolled  diabetes mellitus Diabetic diet Lantus insulin with sliding scale coverage with insulin Monitor blood sugars closely.  -Hyperlipidemia Continue statin medication  -DVT prophylaxis subcu heparin  -Dehydration IV fluids  -Ambulatory dysfunction PT evaluation- suggested Home health.  DISCHARGE CONDITIONS:   Stable.  CONSULTS OBTAINED:  Treatment Team:  Leotis Pain, MD  DRUG ALLERGIES:   Allergies  Allergen Reactions  . Iodinated Diagnostic Agents Shortness Of Breath    Only with nuclear stress test per pt Only with nuclear stress test per pt   . Penicillins Rash    Rash at injection site. Pt states she can take Amoxicillin & Keflex   . Escitalopram   . Lithium   . Erythromycin Nausea Only and Nausea And Vomiting    Other reaction(s): Other (See Comments) dehydration     DISCHARGE MEDICATIONS:   Allergies as of 04/01/2019      Reactions   Iodinated Diagnostic Agents Shortness Of Breath   Only with nuclear stress test per pt Only with nuclear stress test per pt   Penicillins Rash   Rash at injection site. Pt states she can take Amoxicillin & Keflex   Escitalopram    Lithium    Erythromycin Nausea Only, Nausea And Vomiting   Other reaction(s): Other (See Comments) dehydration      Medication List    STOP taking these medications   benazepril 40 MG tablet Commonly known as: LOTENSIN   propranolol 20 MG tablet Commonly known as: INDERAL     TAKE these medications   amLODipine 10 MG tablet Commonly known  as: NORVASC Take 1 tablet (10 mg total) by mouth daily.   aspirin EC 81 MG tablet Take 1 tablet (81 mg total) by mouth daily.   atorvastatin 80 MG tablet Commonly known as: LIPITOR Take 80 mg by mouth daily.   loratadine 10 MG tablet Commonly known as: CLARITIN Take 10 mg by mouth daily as needed for allergies.   losartan 50 MG tablet Commonly known as: COZAAR Take 50 mg by mouth daily.   multivitamin with minerals Tabs  tablet Take 1 tablet by mouth daily.   NovoLOG FlexPen 100 UNIT/ML FlexPen Generic drug: insulin aspart Inject 7-9 Units into the skin 3 (three) times daily with meals.   pyridOXINE 100 MG tablet Commonly known as: VITAMIN B-6 Take 100 mg by mouth daily.   solifenacin 10 MG tablet Commonly known as: VESICARE Take 1 tablet (10 mg total) by mouth daily.   tamsulosin 0.4 MG Caps capsule Commonly known as: FLOMAX Take 1 capsule (0.4 mg total) by mouth at bedtime.   Tyler Aas FlexTouch 200 UNIT/ML Sopn Generic drug: Insulin Degludec Inject 30 Units into the skin daily. What changed: how much to take   Victoza 18 MG/3ML Sopn Generic drug: liraglutide Victoza 3-Pak 0.6 mg/0.1 mL (18 mg/3 mL) subcutaneous pen injector   VITAMIN B-12 PO Take by mouth.   vitamin C 1000 MG tablet Take 1,000 mg by mouth daily.        DISCHARGE INSTRUCTIONS:    Follow with PMD and neurologist in office.  If you experience worsening of your admission symptoms, develop shortness of breath, life threatening emergency, suicidal or homicidal thoughts you must seek medical attention immediately by calling 911 or calling your MD immediately  if symptoms less severe.  You Must read complete instructions/literature along with all the possible adverse reactions/side effects for all the Medicines you take and that have been prescribed to you. Take any new Medicines after you have completely understood and accept all the possible adverse reactions/side effects.   Please note  You were cared for by a hospitalist during your hospital stay. If you have any questions about your discharge medications or the care you received while you were in the hospital after you are discharged, you can call the unit and asked to speak with the hospitalist on call if the hospitalist that took care of you is not available. Once you are discharged, your primary care physician will handle any further medical issues. Please note that  NO REFILLS for any discharge medications will be authorized once you are discharged, as it is imperative that you return to your primary care physician (or establish a relationship with a primary care physician if you do not have one) for your aftercare needs so that they can reassess your need for medications and monitor your lab values.    Today   CHIEF COMPLAINT:   Chief Complaint  Patient presents with  . Fatigue  . Failure To Thrive    HISTORY OF PRESENT ILLNESS:  Holly Conner  is a 78 y.o. female with a known history of prior CVA, hypertension, hyperlipidemia, diabetes mellitus, depression, GERD, and arthritis.  She presented to the emergency room via EMS services reporting progressively worsening fatigue and weakness over the last 4 days.  She reports she has been becoming weaker over the last 4 days however she became unable to ambulate today.  She usually ambulates using the assistance of a cane.  She reports she was unable to get out of her chair and  therefore had to call EMS services for assistance.  She reports decreased p.o. intake over the last 3 to 4 days which she attributes to decreased appetite as well as her inability to ambulate well and prepare food.  She denies recent illness.  She denies fever, chills, nausea, vomiting, diarrhea.  She denies chest pain or shortness of breath.  She denies abdominal pain.  She denies dysuria.  Patient also has a history of CKD.  On arrival BUN is 40 with creatinine 2.70.  She has elevated troponin of 82 which is increased from 71.  EKG shows no significant changes.  CT head was also completed with no acute intracranial abnormalities.  Glucose on arrival is 400.  I have attempted to contact patient's niece who is listed as her emergency contact however there is no answer.  She has been admitted to the hospitalist service for further evaluation and management.   VITAL SIGNS:  Blood pressure (!) 146/70, pulse 72, temperature 98.4 F  (36.9 C), resp. rate 20, height 5\' 1"  (1.549 m), weight 84.3 kg, SpO2 99 %.  I/O:    Intake/Output Summary (Last 24 hours) at 04/01/2019 1153 Last data filed at 04/01/2019 1003 Gross per 24 hour  Intake 240 ml  Output 1625 ml  Net -1385 ml    PHYSICAL EXAMINATION:  GENERAL:  78 y.o.-year-old patient lying in the bed with no acute distress.  EYES: Pupils equal, round, reactive to light and accommodation. No scleral icterus. Extraocular muscles intact.  HEENT: Head atraumatic, normocephalic. Oropharynx and nasopharynx clear.  NECK:  Supple, no jugular venous distention. No thyroid enlargement, no tenderness.  LUNGS: Normal breath sounds bilaterally, no wheezing, rales,rhonchi or crepitation. No use of accessory muscles of respiration.  CARDIOVASCULAR: S1, S2 normal. No murmurs, rubs, or gallops.  ABDOMEN: Soft, non-tender, non-distended. Bowel sounds present. No organomegaly or mass.  EXTREMITIES: No pedal edema, cyanosis, or clubbing.  NEUROLOGIC: Cranial nerves II through XII are intact. Muscle strength 5/5 in all extremities. Sensation intact. Gait not checked.  PSYCHIATRIC: The patient is alert and oriented x 2.  SKIN: No obvious rash, lesion, or ulcer.   DATA REVIEW:   CBC Recent Labs  Lab 04/01/19 0550  WBC 10.4  HGB 12.2  HCT 34.7*  PLT 298    Chemistries  Recent Labs  Lab 03/27/19 1804  04/01/19 0550  NA 134*   < > 136  K 4.1   < > 3.5  CL 98   < > 106  CO2 24   < > 21*  GLUCOSE 371*   < > 211*  BUN 40*   < > 37*  CREATININE 2.70*   < > 2.32*  CALCIUM 9.3   < > 9.0  AST 17  --   --   ALT 14  --   --   ALKPHOS 79  --   --   BILITOT 1.1  --   --    < > = values in this interval not displayed.    Cardiac Enzymes No results for input(s): TROPONINI in the last 168 hours.  Microbiology Results  Results for orders placed or performed during the hospital encounter of 03/27/19  SARS Coronavirus 2 (CEPHEID - Performed in [place] hospital lab), Hosp Order      Status: None   Collection Time: 03/27/19  6:04 PM   Specimen: Nasopharyngeal Swab  Result Value Ref Range Status   SARS Coronavirus 2 NEGATIVE NEGATIVE Final    Comment: (NOTE) If result  is NEGATIVE SARS-CoV-2 target nucleic acids are NOT DETECTED. The SARS-CoV-2 RNA is generally detectable in upper and lower  respiratory specimens during the acute phase of infection. The lowest  concentration of SARS-CoV-2 viral copies this assay can detect is 250  copies / mL. A negative result does not preclude SARS-CoV-2 infection  and should not be used as the sole basis for treatment or other  patient management decisions.  A negative result may occur with  improper specimen collection / handling, submission of specimen other  than nasopharyngeal swab, presence of viral mutation(s) within the  areas targeted by this assay, and inadequate number of viral copies  (<250 copies / mL). A negative result must be combined with clinical  observations, patient history, and epidemiological information. If result is POSITIVE SARS-CoV-2 target nucleic acids are DETECTED. The SARS-CoV-2 RNA is generally detectable in upper and lower  respiratory specimens dur ing the acute phase of infection.  Positive  results are indicative of active infection with SARS-CoV-2.  Clinical  correlation with patient history and other diagnostic information is  necessary to determine patient infection status.  Positive results do  not rule out bacterial infection or co-infection with other viruses. If result is PRESUMPTIVE POSTIVE SARS-CoV-2 nucleic acids MAY BE PRESENT.   A presumptive positive result was obtained on the submitted specimen  and confirmed on repeat testing.  While 2019 novel coronavirus  (SARS-CoV-2) nucleic acids may be present in the submitted sample  additional confirmatory testing may be necessary for epidemiological  and / or clinical management purposes  to differentiate between  SARS-CoV-2 and other  Sarbecovirus currently known to infect humans.  If clinically indicated additional testing with an alternate test  methodology [phone]) is advised. The SARS-CoV-2 RNA is generally  detectable in upper and lower respiratory sp ecimens during the acute  phase of infection. The expected result is Negative. Fact Sheet for Patients:  StrictlyIdeas.no Fact Sheet for Healthcare Providers: BankingDealers.co.za This test is not yet approved or cleared by the [place] FDA and has been authorized for detection and/or diagnosis of SARS-CoV-2 by FDA under an Emergency Use Authorization (EUA).  This EUA will remain in effect (meaning this test can be used) for the duration of the COVID-19 declaration under Section 564(b)(1) of the Act, 21 U.S.C. section 360bbb-3(b)(1), unless the authorization is terminated or revoked sooner. Performed at [facility], [address], [place], [place] [address]     RADIOLOGY:  No results found.  EKG:   Orders placed or performed during the hospital encounter of 03/27/19  . ED EKG  . ED EKG  . EKG 12-Lead  . EKG 12-Lead      Management plans discussed with the patient, family and they are in agreement.  CODE STATUS:     Code Status Orders  (From admission, onward)         Start     Ordered   03/28/19 0205  Full code  Continuous     03/28/19 0204        Code Status History    Date Active Date Inactive Code Status Order ID Comments User Context   12/18/2018 2039 12/20/2018 1744 Full Code [id number]  Sela Hua, MD Inpatient   12/19/2015 2325 12/20/2015 1749 Full Code [id number]  Lance Coon, MD Inpatient   Advance Care Planning Activity    Advance Directive Documentation     Most Recent Value  Type of Advance Directive  Healthcare Power of [place]  Pre-existing out  of facility DNR order (yellow form or pink MOST form)  -  "MOST" Form in Place?  -      TOTAL TIME TAKING  CARE OF THIS PATIENT: 35 minutes.    Vaughan Basta M.D on 04/01/2019 at 11:53 AM  Between 7am to 6pm - Pager - [phone]  After 6pm go to [email or web address] - password [email or web address] [facility]  Office  [phone]  CC: Primary care physician; Dion Body, MD   Note: This dictation was prepared with Dragon dictation along with smaller phrase technology. Any transcriptional errors that result from this process are unintentional.

## 2019-04-01 NOTE — TOC Transition Note (Signed)
Transition of Care [facility]) - CM/SW Discharge Note   Patient Details  Name: Holly Conner MRN: [id number] Date of Birth: 1940-12-29  Transition [facility]) CM/SW Contact:  Ross Ludwig, LCSW Phone Number: 04/01/2019, 5:01 PM   Clinical Narrative:    Patient is an 78 year old female who lives with her sister.  Patient is alert and orientd x3.  Patient's niece is the 38, CSW spent time talking to her on the phone, patient's niece is pursuing guardianship due to patient not being able to make decisions like she used to be able to.  CSW spoke to patient's niece, and informed her about home health services.  Patient's niece was informed that [facility] will be seeing patient, patient's niece was concerned that patient may need a higher level of care some day or that she is not able to continue to live on her own.  CSW informed her that hopefully home health services can assist with providing patient what she needs to be successful in her home.  CSW inquired if there is any equipment patient may need, patient's niece said not currently, she is trying to get a ramp installed.  Patient's niece stated that patient has had some confusion recently, and not always aware of financial decisions she makes.  Patient has had an increase in confusion recently, niece expressed concern for patient.  Patient's niece was appreciative of information given, she did not have any other questions or concerns.   Final next level of care: Home w Home Health Services Barriers to Discharge: Barriers Resolved   Patient Goals and CMS Choice   CMS Medicare.gov Compare Post Acute Care list provided to:: Patient Represenative (must comment) Choice offered to / list presented to : [place] / [place]  Discharge Placement  Patient discharging back home with home health through [place].                     Discharge Plan and Services In-house Referral: Clinical Social Work   Post Acute Care Choice:  Home Health          DME Arranged: N/A DME Agency: NA       HH Arranged: RN, PT, OT, Nurse's Aide, Social Work [employer] Agency: [place] ([place]) Date [place]: 03/31/19 Time [place]: [place] Representative spoke with at [place]: [place] ([place]) Interventions     Readmission Risk Interventions No flowsheet data found.

## 2019-04-01 NOTE — Progress Notes (Addendum)
Reason for Consult: strokes Referring Physician: Dr. Anselm Jungling   8/4: No major event overnight Patient states she fells better and ready to go home Neuro exam remains stable and unchanged from my previuos exam Continue ASA/Statin.  TEE with evidence of plaque in ascending Ao and aortic arch PT/OT Cardiology consult for input, if patientneed plavix Need neuro f/up and cardiology f/up    CC: weakness   HPI: Holly Conner is an 78 y.o. female known history of prior CVA, hypertension, hyperlipidemia, diabetes mellitus, depression, GERD, and arthritis.  She presented to the emergency room via EMS services reporting progressively worsening fatigue and weakness over the last 4 days.  She reports she has been becoming weaker over the last 4 days however she became unable to ambulate today.   Patient also has a history of CKD.  On arrival BUN is 40 with creatinine 2.70.  She has elevated troponin of 82 which is increased from 71.  EKG shows no significant changes.  CT head was also completed with no acute intracranial abnormalities.  Glucose on arrival is 400. Pt is found to have embolic strokes on MRI bilaterally.   MRA with Motion degraded exam. No suspicion of carotid bifurcation stenosis. Antegrade flow in both vertebral arteries.Atherosclerotic narrowing in the carotid siphon regions. Possibleleft M2 MCA stenosis versus artifactual signal loss.This examination does not explain the multiple punctate infarctions, which remain more likely to have originated from the heart or ascending aorta.  Echo ( see report)  TEE pending Past Medical History:  Diagnosis Date  . Arthritis   . Depression    controlled  . Diabetes mellitus without complication ([place])    managed well;   Marland Kitchen GERD (gastroesophageal reflux disease)   . Hepatitis   . HLD (hyperlipidemia)   . Hypertension    somewhat controlled  . 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 Polymyalgia rheumatica ([place])   .  Sleep apnea   . Stroke [facility])     Past Surgical History:  Procedure Laterality Date  . ABDOMINAL HYSTERECTOMY    . BILATERAL SALPINGOOPHORECTOMY    . ESOPHAGEAL DILATION    . OOPHORECTOMY    . REDUCTION MAMMAPLASTY Bilateral 2003  . REPLACEMENT TOTAL KNEE Right   . TEE WITHOUT CARDIOVERSION N/A 03/31/2019   Procedure: TRANSESOPHAGEAL ECHOCARDIOGRAM (TEE);  Surgeon: Corey Skains, MD;  Location: [facility] ORS;  Service: Cardiovascular;  Laterality: N/A;  . TONSILLECTOMY AND ADENOIDECTOMY      Family History  Problem Relation Age of Onset  . CAD Brother   . Kidney cancer Mother   . Breast cancer Cousin     Social History:  reports that she has never smoked. She has never used smokeless tobacco. She reports that she does not drink alcohol or use drugs.  Allergies  Allergen Reactions  . Iodinated Diagnostic Agents Shortness Of Breath    Only with nuclear stress test per pt Only with nuclear stress test per pt   . Penicillins Rash    Rash at injection site. Pt states she can take Amoxicillin & Keflex   . Escitalopram   . Lithium   . Erythromycin Nausea Only and Nausea And Vomiting    Other reaction(s): Other (See Comments) dehydration     Medications: I have reviewed the patient's current medications.    Physical Examination: Blood pressure (!) 146/70, pulse 72, temperature 98.4 F (36.9 C), resp. rate 20, height 5\' 1"  (1.549 m), weight 84.3 kg, SpO2 99 %.  HEENT-  Normocephalic, no lesions, without obvious abnormality.  Normal external eye and conjunctiva.  Normal TM's bilaterally.  Normal auditory canals and external ears.   8/3; Neurological Examination   Mental Status: patient little drwosy ( she just got from her TEE) but easily arousable, oriented, thought content appropriate.  Speech fluent without evidence of aphasia.  Able to follow 3 step commands without difficulty.  Cranial Nerves: IVisual fields grossly normal, pupils equal, round, reactive to light and  accommodation III,IV, VI: ptosis not present, extra-ocular motions intact bilaterally V,VII: smile symmetric, facial light touch sensation normal bilaterally VIII: hearing normal bilaterally IX,X: gag reflex present XI: bilateral shoulder shrug XII: midline tongue extension Motor: Generalized weakness  Tone and bulk:normal tone throughout; no atrophy noted Sensory: Pinprick and light touch intact throughout, bilaterally Deep Tendon Reflexes: 2+ and symmetric throughout Plantars: Right: downgoing   Left: downgoing Cerebellar: normal finger-to-nose, normal rapid alternating movements and normal heel-to-shin test Gait: not tested  I will re check on patient later on      Laboratory Studies:   Basic Metabolic Panel: Recent Labs  Lab 03/27/19 1804 03/28/19 0638 03/29/19 0540 03/31/19 0542 04/01/19 0550  NA 134* 137 139 139 136  K 4.1 4.0 3.4* 3.6 3.5  CL 98 101 106 109 106  CO2 24 21* 24 22 21*  GLUCOSE 371* 368* 291* 265* 211*  BUN 40* 38* 39* 37* 37*  CREATININE 2.70* 2.67* 2.44* 2.23* 2.32*  CALCIUM 9.3 9.4 8.5* 9.0 9.0    Liver Function Tests: Recent Labs  Lab 03/27/19 1804  AST 17  ALT 14  ALKPHOS 79  BILITOT 1.1  PROT 7.0  ALBUMIN 3.7   No results for input(s): LIPASE, AMYLASE in the last 168 hours. No results for input(s): AMMONIA in the last 168 hours.  CBC: Recent Labs  Lab 03/27/19 1804 03/28/19 0638 03/29/19 0540 04/01/19 0550  WBC 10.1 10.5 8.8 10.4  HGB 13.2 13.1 11.3* 12.2  HCT 37.7 37.8 33.0* 34.7*  MCV 84.0 84.6 85.9 85.3  PLT 390 399 315 298    Cardiac Enzymes: No results for input(s): CKTOTAL, CKMB, CKMBINDEX, TROPONINI in the last 168 hours.  BNP: Invalid input(s): POCBNP  CBG: Recent Labs  Lab 03/31/19 0744 03/31/19 1147 03/31/19 1715 03/31/19 2051 04/01/19 0737  GLUCAP 275* 177* 313* 293* 215*    Microbiology: Results for orders placed or performed during the hospital encounter of 03/27/19  SARS Coronavirus 2  (CEPHEID - Performed in [place] hospital lab), Hosp Order     Status: None   Collection Time: 03/27/19  6:04 PM   Specimen: Nasopharyngeal Swab  Result Value Ref Range Status   SARS Coronavirus 2 NEGATIVE NEGATIVE Final    Comment: (NOTE) If result is NEGATIVE SARS-CoV-2 target nucleic acids are NOT DETECTED. The SARS-CoV-2 RNA is generally detectable in upper and lower  respiratory specimens during the acute phase of infection. The lowest  concentration of SARS-CoV-2 viral copies this assay can detect is 250  copies / mL. A negative result does not preclude SARS-CoV-2 infection  and should not be used as the sole basis for treatment or other  patient management decisions.  A negative result may occur with  improper specimen collection / handling, submission of specimen other  than nasopharyngeal swab, presence of viral mutation(s) within the  areas targeted by this assay, and inadequate number of viral copies  (<250 copies / mL). A negative result must be combined with clinical  observations, patient history, and epidemiological  information. If result is POSITIVE SARS-CoV-2 target nucleic acids are DETECTED. The SARS-CoV-2 RNA is generally detectable in upper and lower  respiratory specimens dur ing the acute phase of infection.  Positive  results are indicative of active infection with SARS-CoV-2.  Clinical  correlation with patient history and other diagnostic information is  necessary to determine patient infection status.  Positive results do  not rule out bacterial infection or co-infection with other viruses. If result is PRESUMPTIVE POSTIVE SARS-CoV-2 nucleic acids MAY BE PRESENT.   A presumptive positive result was obtained on the submitted specimen  and confirmed on repeat testing.  While 2019 novel coronavirus  (SARS-CoV-2) nucleic acids may be present in the submitted sample  additional confirmatory testing may be necessary for epidemiological  and / or clinical  management purposes  to differentiate between  SARS-CoV-2 and other Sarbecovirus currently known to infect humans.  If clinically indicated additional testing with an alternate test  methodology [phone]) is advised. The SARS-CoV-2 RNA is generally  detectable in upper and lower respiratory sp ecimens during the acute  phase of infection. The expected result is Negative. Fact Sheet for Patients:  StrictlyIdeas.no Fact Sheet for Healthcare Providers: BankingDealers.co.za This test is not yet approved or cleared by the [place] FDA and has been authorized for detection and/or diagnosis of SARS-CoV-2 by FDA under an Emergency Use Authorization (EUA).  This EUA will remain in effect (meaning this test can be used) for the duration of the COVID-19 declaration under Section 564(b)(1) of the Act, 21 U.S.C. section 360bbb-3(b)(1), unless the authorization is terminated or revoked sooner. Performed at [facility], [address] [place], [place] [address]     Coagulation Studies: No results for input(s): LABPROT, INR in the last 72 hours.  Urinalysis:  Recent Labs  Lab 03/27/19 1804 03/31/19 1517  COLORURINE YELLOW* YELLOW*  LABSPEC 1.010 1.015  PHURINE 7.0 5.0  GLUCOSEU >=500* >=500*  HGBUR NEGATIVE NEGATIVE  BILIRUBINUR NEGATIVE NEGATIVE  KETONESUR 5* NEGATIVE  PROTEINUR 100* 100*  NITRITE NEGATIVE NEGATIVE  LEUKOCYTESUR NEGATIVE NEGATIVE    Lipid Panel:     Component Value Date/Time   CHOL 189 12/20/2015 0513   TRIG 191 (H) 12/20/2015 0513   HDL 41 12/20/2015 0513   CHOLHDL 4.6 12/20/2015 0513   VLDL 38 12/20/2015 0513   LDLCALC 110 (H) 12/20/2015 0513    HgbA1C:  Lab Results  Component Value Date   HGBA1C 6.9 (H) 03/28/2019    Urine Drug Screen:      Component Value Date/Time   LABOPIA NONE DETECTED 12/19/2015 2026   COCAINSCRNUR NONE DETECTED 12/19/2015 2026   LABBENZ NONE DETECTED 12/19/2015  2026   AMPHETMU NONE DETECTED 12/19/2015 2026   THCU NONE DETECTED 12/19/2015 2026   LABBARB NONE DETECTED 12/19/2015 2026    Alcohol Level: No results for input(s): ETH in the last 168 hours.    Imaging: No results found.   Assessment/Plan:  78 y.o. female known history of prior CVA, hypertension, hyperlipidemia, diabetes mellitus, depression, GERD, and arthritis.  She presented to the emergency room via EMS services reporting progressively worsening fatigue and weakness over the last 4 days.  She reports she has been becoming weaker over the last 4 days however she became unable to ambulate today.   Patient also has a history of CKD.  On arrival BUN is 40 with creatinine 2.70.  She has elevated troponin of 82 which is increased from 71.  EKG shows no significant changes.  CT head  was also completed with no acute intracranial abnormalities.  Glucose on arrival is 400. Pt is found to have embolic strokes on MRI bilaterally. MRA: No suspicion of carotid bifurcation stenosis.Antegrade flow in both vertebral arteries.Atherosclerotic narrowing in the carotid siphon regions. Possibleleft M2 MCA stenosis versus artifactual signal loss.This examination does not explain the multiple punctate infarctions,which remain more likely to have originated from the heart orascending aorta. TEE results  Pending.  Continue neuro protective measures including normothermia, normoglycemia, crredt electrolytes/metabolic abnliites. Continue ASA/Statin.  PT/OT.  04/01/2019, 10:37 AM

## 2019-04-01 NOTE — Progress Notes (Signed)
Inpatient Diabetes Program Recommendations  AACE/ADA: New Consensus Statement on Inpatient Glycemic Control   Target Ranges:  Prepandial:   less than 140 mg/dL      Peak postprandial:   less than 180 mg/dL (1-2 hours)      Critically ill patients:  140 - 180 mg/dL   Results for NORAA, PICKERAL (MRN [id number]) as of 04/01/2019 09:42  Ref. Range 03/31/2019 07:44 03/31/2019 11:47 03/31/2019 17:15 03/31/2019 20:51 04/01/2019 07:37  Glucose-Capillary Latest Ref Range: 70 - 99 mg/dL 275 (H) 177 (H) 313 (H) 293 (H) 215 (H)   Review of Glycemic Control  Diabetes history: DM2 Outpatient Diabetes medications: Tresiba 22 units daily, Novolog 7-9 units TID with meals, Victoza 1.2 mg daily Current orders for Inpatient glycemic control: Lantus 30 units daily, Novolog 0-15 units TID with meals, Novolog 0-5 units QHS  Inpatient Diabetes Program Recommendations:   Insulin-Basal: Please consider increasing Lantus to 33 units daily.  Insulin-Meal Coverage: Please consider ordering Novolog 4 units TID with meals for meal coverage if patient eats at least 50% of meals.  Thanks, Barnie Alderman, RN, MSN, CDE Diabetes Coordinator Inpatient Diabetes Program [phone] (Team Pager from 8am to 5pm)

## 2019-04-01 NOTE — Progress Notes (Signed)
Discharge instructions explained to Holly Conner, POA/ verbalized an understanding/ iv removed/ RX given to pt/ transported off unit via wheelchair.

## 2019-04-01 NOTE — Care Management Important Message (Signed)
Important Message  Patient Details  Name: Holly Conner MRN: [id number] Date of Birth: Dec 29, 1940   Medicare Important Message Given:  Yes  Initial given by Patient Access Associate on 04/01/2019 at 1115.    Dannette Quintessa 04/01/2019, 11:24 AM

## 2019-04-03 ENCOUNTER — Other Ambulatory Visit: Payer: Self-pay

## 2019-04-03 ENCOUNTER — Inpatient Hospital Stay
Admission: EM | Admit: 2019-04-03 | Discharge: 2019-04-07 | DRG: 637 | Disposition: A | Discharge status: Skilled Nursing Facility | Payer: Medicare Other | Attending: Specialist | Admitting: Specialist

## 2019-04-03 ENCOUNTER — Emergency Department: Payer: Medicare Other

## 2019-04-03 DIAGNOSIS — Z91041 Radiographic dye allergy status: Secondary | ICD-10-CM

## 2019-04-03 DIAGNOSIS — J329 Chronic sinusitis, unspecified: Secondary | ICD-10-CM | POA: Diagnosis present

## 2019-04-03 DIAGNOSIS — R296 Repeated falls: Secondary | ICD-10-CM | POA: Diagnosis present

## 2019-04-03 DIAGNOSIS — Z20828 Contact with and (suspected) exposure to other viral communicable diseases: Secondary | ICD-10-CM | POA: Diagnosis present

## 2019-04-03 DIAGNOSIS — T68XXXA Hypothermia, initial encounter: Secondary | ICD-10-CM

## 2019-04-03 DIAGNOSIS — Z880 Allergy status to penicillin: Secondary | ICD-10-CM

## 2019-04-03 DIAGNOSIS — R778 Other specified abnormalities of plasma proteins: Secondary | ICD-10-CM | POA: Diagnosis present

## 2019-04-03 DIAGNOSIS — E119 Type 2 diabetes mellitus without complications: Secondary | ICD-10-CM

## 2019-04-03 DIAGNOSIS — R404 Transient alteration of awareness: Secondary | ICD-10-CM | POA: Diagnosis present

## 2019-04-03 DIAGNOSIS — G4730 Sleep apnea, unspecified: Secondary | ICD-10-CM | POA: Diagnosis present

## 2019-04-03 DIAGNOSIS — M353 Polymyalgia rheumatica: Secondary | ICD-10-CM | POA: Diagnosis present

## 2019-04-03 DIAGNOSIS — M1990 Unspecified osteoarthritis, unspecified site: Secondary | ICD-10-CM | POA: Diagnosis present

## 2019-04-03 DIAGNOSIS — E1165 Type 2 diabetes mellitus with hyperglycemia: Secondary | ICD-10-CM | POA: Diagnosis not present

## 2019-04-03 DIAGNOSIS — G4733 Obstructive sleep apnea (adult) (pediatric): Secondary | ICD-10-CM | POA: Diagnosis present

## 2019-04-03 DIAGNOSIS — Z9079 Acquired absence of other genital organ(s): Secondary | ICD-10-CM

## 2019-04-03 DIAGNOSIS — E11649 Type 2 diabetes mellitus with hypoglycemia without coma: Secondary | ICD-10-CM | POA: Diagnosis not present

## 2019-04-03 DIAGNOSIS — G9341 Metabolic encephalopathy: Secondary | ICD-10-CM | POA: Diagnosis present

## 2019-04-03 DIAGNOSIS — Z881 Allergy status to other antibiotic agents status: Secondary | ICD-10-CM

## 2019-04-03 DIAGNOSIS — E162 Hypoglycemia, unspecified: Secondary | ICD-10-CM | POA: Diagnosis not present

## 2019-04-03 DIAGNOSIS — I129 Hypertensive chronic kidney disease with stage 1 through stage 4 chronic kidney disease, or unspecified chronic kidney disease: Secondary | ICD-10-CM | POA: Diagnosis present

## 2019-04-03 DIAGNOSIS — Z79899 Other long term (current) drug therapy: Secondary | ICD-10-CM

## 2019-04-03 DIAGNOSIS — R7989 Other specified abnormal findings of blood chemistry: Secondary | ICD-10-CM | POA: Diagnosis present

## 2019-04-03 DIAGNOSIS — Z803 Family history of malignant neoplasm of breast: Secondary | ICD-10-CM

## 2019-04-03 DIAGNOSIS — E876 Hypokalemia: Secondary | ICD-10-CM | POA: Diagnosis present

## 2019-04-03 DIAGNOSIS — Z8249 Family history of ischemic heart disease and other diseases of the circulatory system: Secondary | ICD-10-CM

## 2019-04-03 DIAGNOSIS — Z90710 Acquired absence of both cervix and uterus: Secondary | ICD-10-CM

## 2019-04-03 DIAGNOSIS — I69319 Unspecified symptoms and signs involving cognitive functions following cerebral infarction: Secondary | ICD-10-CM

## 2019-04-03 DIAGNOSIS — E1122 Type 2 diabetes mellitus with diabetic chronic kidney disease: Secondary | ICD-10-CM | POA: Diagnosis present

## 2019-04-03 DIAGNOSIS — E785 Hyperlipidemia, unspecified: Secondary | ICD-10-CM | POA: Diagnosis present

## 2019-04-03 DIAGNOSIS — I10 Essential (primary) hypertension: Secondary | ICD-10-CM | POA: Diagnosis present

## 2019-04-03 DIAGNOSIS — Z8051 Family history of malignant neoplasm of kidney: Secondary | ICD-10-CM

## 2019-04-03 DIAGNOSIS — K219 Gastro-esophageal reflux disease without esophagitis: Secondary | ICD-10-CM | POA: Diagnosis present

## 2019-04-03 DIAGNOSIS — F0390 Unspecified dementia without behavioral disturbance: Secondary | ICD-10-CM | POA: Diagnosis present

## 2019-04-03 DIAGNOSIS — R413 Other amnesia: Secondary | ICD-10-CM | POA: Diagnosis present

## 2019-04-03 DIAGNOSIS — Z96651 Presence of right artificial knee joint: Secondary | ICD-10-CM | POA: Diagnosis present

## 2019-04-03 DIAGNOSIS — R4189 Other symptoms and signs involving cognitive functions and awareness: Secondary | ICD-10-CM | POA: Diagnosis present

## 2019-04-03 DIAGNOSIS — Z794 Long term (current) use of insulin: Secondary | ICD-10-CM

## 2019-04-03 DIAGNOSIS — Z7982 Long term (current) use of aspirin: Secondary | ICD-10-CM

## 2019-04-03 DIAGNOSIS — N184 Chronic kidney disease, stage 4 (severe): Secondary | ICD-10-CM | POA: Diagnosis present

## 2019-04-03 LAB — CBC
HCT: 36.3 % (ref 36.0–46.0)
Hemoglobin: 12.2 g/dL (ref 12.0–15.0)
MCH: 28.8 pg (ref 26.0–34.0)
MCHC: 33.6 g/dL (ref 30.0–36.0)
MCV: 85.8 fL (ref 80.0–100.0)
Platelets: 362 10*3/uL (ref 150–400)
RBC: 4.23 MIL/uL (ref 3.87–5.11)
RDW: 14.6 % (ref 11.5–15.5)
WBC: 8.8 10*3/uL (ref 4.0–10.5)
nRBC: 0 % (ref 0.0–0.2)

## 2019-04-03 LAB — URINALYSIS, COMPLETE (UACMP) WITH MICROSCOPIC
Bacteria, UA: NONE SEEN
Bilirubin Urine: NEGATIVE
Glucose, UA: 150 mg/dL — AB
Ketones, ur: 5 mg/dL — AB
Leukocytes,Ua: NEGATIVE
Nitrite: NEGATIVE
Protein, ur: 100 mg/dL — AB
Specific Gravity, Urine: 1.017 (ref 1.005–1.030)
Squamous Epithelial / LPF: NONE SEEN (ref 0–5)
pH: 5 (ref 5.0–8.0)

## 2019-04-03 LAB — LACTIC ACID, PLASMA: Lactic Acid, Venous: 1.7 mmol/L (ref 0.5–1.9)

## 2019-04-03 LAB — COMPREHENSIVE METABOLIC PANEL
ALT: 57 U/L — ABNORMAL HIGH (ref 0–44)
AST: 78 U/L — ABNORMAL HIGH (ref 15–41)
Albumin: 3.8 g/dL (ref 3.5–5.0)
Alkaline Phosphatase: 66 U/L (ref 38–126)
Anion gap: 12 (ref 5–15)
BUN: 56 mg/dL — ABNORMAL HIGH (ref 8–23)
CO2: 23 mmol/L (ref 22–32)
Calcium: 9.4 mg/dL (ref 8.9–10.3)
Chloride: 107 mmol/L (ref 98–111)
Creatinine, Ser: 2.69 mg/dL — ABNORMAL HIGH (ref 0.44–1.00)
GFR calc Af Amer: 19 mL/min — ABNORMAL LOW (ref >60)
GFR calc non Af Amer: 16 mL/min — ABNORMAL LOW (ref >60)
Glucose, Bld: 196 mg/dL — ABNORMAL HIGH (ref 70–99)
Potassium: 3.6 mmol/L (ref 3.5–5.1)
Sodium: 142 mmol/L (ref 135–145)
Total Bilirubin: 0.9 mg/dL (ref 0.3–1.2)
Total Protein: 7 g/dL (ref 6.5–8.1)

## 2019-04-03 LAB — GLUCOSE, CAPILLARY: Glucose-Capillary: 108 mg/dL — ABNORMAL HIGH (ref 70–99)

## 2019-04-03 LAB — SARS CORONAVIRUS 2 BY RT PCR (HOSPITAL ORDER, PERFORMED IN ~~LOC~~ HOSPITAL LAB): SARS Coronavirus 2: NEGATIVE

## 2019-04-03 LAB — TROPONIN I (HIGH SENSITIVITY): Troponin I (High Sensitivity): 100 ng/L (ref <18)

## 2019-04-03 MED ORDER — SODIUM CHLORIDE 0.9 % IV BOLUS
500.0000 mL | Freq: Once | INTRAVENOUS | Status: AC
  Administered 2019-04-03: 500 mL via INTRAVENOUS

## 2019-04-03 NOTE — ED Notes (Signed)
ED TO INPATIENT HANDOFF REPORT  ED Nurse Name and Phone #: [place] Levels Name/Age/Gender Holly Conner 78 y.o. female Room/Bed: ED16A/ED16A  Code Status   Code Status: Prior  Home/SNF/Other Home Patient oriented to: self Is this baseline? No   Triage Complete: Triage complete  Chief Complaint hypoglycemia  Triage Note Pt arrives to  ED from home via [facility] with c/c of a mechanical fall and hypoglycemia. Pt was discharged from the hospital 6 days ago after having a stroke. Pt fell while getting out of bed earlier today. EMS found her laying on the floor shivering. Her initial CBG was 32. After 3 mg instant glucose and 1 mg glucagon repeat CBG was 96. Transport vitals were 194/92, p80, O2 sat 95%. Pt is diabetic and took her insulin this morning but cant remember if she ate after that. Upon arrival, pt A&O to self and place but not time or situation. Initial rectal temp of 92.9. Dr Cherylann Banas notified.    Allergies Allergies  Allergen Reactions  . Iodinated Diagnostic Agents Shortness Of Breath    Only with nuclear stress test per pt Only with nuclear stress test per pt   . Penicillins Rash    Rash at injection site. Pt states she can take Amoxicillin & Keflex   . Escitalopram   . Lithium   . Erythromycin Nausea Only and Nausea And Vomiting    Other reaction(s): Other (See Comments) dehydration     Level of Care/Admitting Diagnosis ED Disposition    ED Disposition Condition [facility]: [place] [100120]  Level of Care: Telemetry [5]  Covid Evaluation: Asymptomatic Screening Protocol (No Symptoms)  Diagnosis: Unresponsive episode [id number]  Admitting Physician: Lance Coon [id number]  Attending Physician: Lance Coon [id number]  Bed request comments: 2a  PT Class (Do Not Modify): Observation [104]  PT Acc Code (Do Not Modify): Observation [10022]       B Medical/Surgery History Past Medical History:  Diagnosis Date   . Arthritis   . Depression    controlled  . Diabetes mellitus without complication ([place])    managed well;   Marland Kitchen GERD (gastroesophageal reflux disease)   . Hepatitis   . HLD (hyperlipidemia)   . Hypertension    somewhat controlled  . 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 Polymyalgia rheumatica ([place])   . Sleep apnea   . Stroke [facility])    Past Surgical History:  Procedure Laterality Date  . ABDOMINAL HYSTERECTOMY    . BILATERAL SALPINGOOPHORECTOMY    . ESOPHAGEAL DILATION    . OOPHORECTOMY    . REDUCTION MAMMAPLASTY Bilateral 2003  . REPLACEMENT TOTAL KNEE Right   . TEE WITHOUT CARDIOVERSION N/A 03/31/2019   Procedure: TRANSESOPHAGEAL ECHOCARDIOGRAM (TEE);  Surgeon: Corey Skains, MD;  Location: [facility] ORS;  Service: Cardiovascular;  Laterality: N/A;  . TONSILLECTOMY AND ADENOIDECTOMY       A IV Location/Drains/Wounds Patient Lines/Drains/Airways Status   Active Line/Drains/Airways    Name:   Placement date:   Placement time:   Site:   Days:   Peripheral IV 03/28/19 Right;Medial Forearm   03/28/19    0337    Forearm   6   Peripheral IV 03/28/19 Left;Anterior Forearm   03/28/19    0338    Forearm   6   Peripheral IV 04/03/19 Right Antecubital   04/03/19    1950    Antecubital   less than 1  Intake/Output Last 24 hours No intake or output data in the 24 hours ending 04/03/19 2317  Labs/Imaging Results for orders placed or performed during the hospital encounter of 04/03/19 (from the past 48 hour(s))  Glucose, capillary     Status: Abnormal   Collection Time: 04/03/19  7:15 PM  Result Value Ref Range   Glucose-Capillary 108 (H) 70 - 99 mg/dL  Comprehensive metabolic panel     Status: Abnormal   Collection Time: 04/03/19  7:53 PM  Result Value Ref Range   Sodium 142 135 - 145 mmol/L   Potassium 3.6 3.5 - 5.1 mmol/L   Chloride 107 98 - 111 mmol/L   CO2 23 22 - 32 mmol/L   Glucose, Bld 196 (H) 70 - 99 mg/dL   BUN 56 (H) 8 - 23 mg/dL    Creatinine, Ser 2.69 (H) 0.44 - 1.00 mg/dL   Calcium 9.4 8.9 - 10.3 mg/dL   Total Protein 7.0 6.5 - 8.1 g/dL   Albumin 3.8 3.5 - 5.0 g/dL   AST 78 (H) 15 - 41 U/L   ALT 57 (H) 0 - 44 U/L   Alkaline Phosphatase 66 38 - 126 U/L   Total Bilirubin 0.9 0.3 - 1.2 mg/dL   GFR calc non Af Amer 16 (L) >60 mL/min   GFR calc Af Amer 19 (L) >60 mL/min   Anion gap 12 5 - 15    Comment: Performed at [facility], [address] [place], [place] [address]  CBC     Status: None   Collection Time: 04/03/19  7:53 PM  Result Value Ref Range   WBC 8.8 4.0 - 10.5 K/uL   RBC 4.23 3.87 - 5.11 MIL/uL   Hemoglobin 12.2 12.0 - 15.0 g/dL   HCT 36.3 36.0 - 46.0 %   MCV 85.8 80.0 - 100.0 fL   MCH 28.8 26.0 - 34.0 pg   MCHC 33.6 30.0 - 36.0 g/dL   RDW 14.6 11.5 - 15.5 %   Platelets 362 150 - 400 K/uL   nRBC 0.0 0.0 - 0.2 %    Comment: Performed at [facility], [address] [place], [place] [address]  Lactic acid, plasma     Status: None   Collection Time: 04/03/19  7:53 PM  Result Value Ref Range   Lactic Acid, Venous 1.7 0.5 - 1.9 mmol/L    Comment: Performed at [facility], [address] [place], [place] [address]  Troponin I (High Sensitivity)     Status: Abnormal   Collection Time: 04/03/19  7:53 PM  Result Value Ref Range   Troponin I (High Sensitivity) 100 (HH) <18 ng/L    Comment: CRITICAL RESULT CALLED TO, READ BACK BY AND VERIFIED WITH ZACK BRADY 04/03/19 @ 2123  [place] (NOTE) Elevated high sensitivity troponin I (hsTnI) values and significant  changes across serial measurements may suggest ACS but many other  chronic and acute conditions are known to elevate hsTnI results.  Refer to the "Links" section for chest pain algorithms and additional  guidance. Performed at [facility], [address] [place], [place] [address]   Urinalysis, Complete w Microscopic     Status: Abnormal   Collection Time: 04/03/19  8:47 PM  Result Value Ref Range    Color, Urine YELLOW (A) YELLOW   APPearance HAZY (A) CLEAR   Specific Gravity, Urine 1.017 1.005 - 1.030   pH 5.0 5.0 - 8.0   Glucose, UA 150 (A) NEGATIVE mg/dL  Hgb urine dipstick SMALL (A) NEGATIVE   Bilirubin Urine NEGATIVE NEGATIVE   Ketones, ur 5 (A) NEGATIVE mg/dL   Protein, ur 100 (A) NEGATIVE mg/dL   Nitrite NEGATIVE NEGATIVE   Leukocytes,Ua NEGATIVE NEGATIVE   RBC / HPF 0-5 0 - 5 RBC/hpf   WBC, UA 0-5 0 - 5 WBC/hpf   Bacteria, UA NONE SEEN NONE SEEN   Squamous Epithelial / LPF NONE SEEN 0 - 5   Mucus PRESENT    Hyaline Casts, UA PRESENT    Granular Casts, UA PRESENT    Amorphous Crystal PRESENT     Comment: Performed at [facility], [address] [place], [place] [address]  SARS Coronavirus 2 [facility] order, Performed in [facility] hospital lab) Nasopharyngeal Nasopharyngeal Swab     Status: None   Collection Time: 04/03/19  8:47 PM   Specimen: Nasopharyngeal Swab  Result Value Ref Range   SARS Coronavirus 2 NEGATIVE NEGATIVE    Comment: (NOTE) If result is NEGATIVE SARS-CoV-2 target nucleic acids are NOT DETECTED. The SARS-CoV-2 RNA is generally detectable in upper and lower  respiratory specimens during the acute phase of infection. The lowest  concentration of SARS-CoV-2 viral copies this assay can detect is 250  copies / mL. A negative result does not preclude SARS-CoV-2 infection  and should not be used as the sole basis for treatment or other  patient management decisions.  A negative result may occur with  improper specimen collection / handling, submission of specimen other  than nasopharyngeal swab, presence of viral mutation(s) within the  areas targeted by this assay, and inadequate number of viral copies  (<250 copies / mL). A negative result must be combined with clinical  observations, patient history, and epidemiological information. If result is POSITIVE SARS-CoV-2 target nucleic acids are DETECTED. The SARS-CoV-2 RNA is generally  detectable in upper and lower  respiratory specimens dur ing the acute phase of infection.  Positive  results are indicative of active infection with SARS-CoV-2.  Clinical  correlation with patient history and other diagnostic information is  necessary to determine patient infection status.  Positive results do  not rule out bacterial infection or co-infection with other viruses. If result is PRESUMPTIVE POSTIVE SARS-CoV-2 nucleic acids MAY BE PRESENT.   A presumptive positive result was obtained on the submitted specimen  and confirmed on repeat testing.  While 2019 novel coronavirus  (SARS-CoV-2) nucleic acids may be present in the submitted sample  additional confirmatory testing may be necessary for epidemiological  and / or clinical management purposes  to differentiate between  SARS-CoV-2 and other Sarbecovirus currently known to infect humans.  If clinically indicated additional testing with an alternate test  methodology [phone]) is advised. The SARS-CoV-2 RNA is generally  detectable in upper and lower respiratory sp ecimens during the acute  phase of infection. The expected result is Negative. Fact Sheet for Patients:  StrictlyIdeas.no Fact Sheet for Healthcare Providers: BankingDealers.co.za This test is not yet approved or cleared by the [place] FDA and has been authorized for detection and/or diagnosis of SARS-CoV-2 by FDA under an Emergency Use Authorization (EUA).  This EUA will remain in effect (meaning this test can be used) for the duration of the COVID-19 declaration under Section 564(b)(1) of the Act, 21 U.S.C. section 360bbb-3(b)(1), unless the authorization is terminated or revoked sooner. Performed at [facility], [address] [place], [place] [address]    Ct Head Wo Contrast  Result Date: 04/03/2019 CLINICAL DATA:  Status post fall. EXAM: CT HEAD WITHOUT CONTRAST TECHNIQUE: Contiguous axial  images were obtained from the base of the skull through the vertex without intravenous contrast. COMPARISON:  Brain MRI 03/29/2019 FINDINGS: Brain: Small left-sided ischemic infarcts in the left temporal, parietal and occipital lobes, demonstrated by recent MRI are not well seen on this exam. Small area of hypoattenuation in the subcortical white matter of the right frontal lobe corresponds to previously demonstrated small ischemic infarct. No evidence of acute hemorrhage. Moderate brain parenchymal volume loss and deep white matter microangiopathy. Vascular: Intracranial calcific atherosclerotic disease. Skull: Normal. Negative for fracture or focal lesion. Sinuses/Orbits: No acute finding. Other: None. IMPRESSION: 1. 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 2. Small area of hypoattenuation in the subcortical white matter of the right frontal lobe corresponds to previously demonstrated ischemic infarct. 3. No evidence of acute hemorrhage. 4. Moderate brain parenchymal atrophy and chronic microvascular disease. Electronically Signed   By: Fidela Salisbury M.D.   On: 04/03/2019 21:08   Dg Chest Portable 1 View  Result Date: 04/03/2019 CLINICAL DATA:  Fall.  Shortness of breath. EXAM: PORTABLE CHEST 1 VIEW COMPARISON:  03/27/2019 FINDINGS: Mild cardiomegaly. Lungs clear. No effusions. No acute bony abnormality. IMPRESSION: Mild cardiomegaly.  No active disease. Electronically Signed   By: Rolm Baptise M.D.   On: 04/03/2019 21:05    Pending Labs Unresulted Labs (From admission, onward)    Start     Ordered   04/03/19 1957  Lactic acid, plasma  Now then every 2 hours,   STAT     04/03/19 1957   04/03/19 1957  Culture, blood (routine x 2)  BLOOD CULTURE X 2,   STAT     04/03/19 1957   Signed and Held  CBC  (heparin)  Once,   R    Comments: Baseline for heparin therapy IF NOT ALREADY DRAWN.  Notify MD  if PLT < 100 K.    Signed and Held   Signed and Held  Creatinine, serum  (heparin)  Once,   R    Comments: Baseline for heparin therapy IF NOT ALREADY DRAWN.    Signed and Held   Signed and Held  Basic metabolic panel  Tomorrow morning,   R     Signed and Held   Signed and Held  CBC  Tomorrow morning,   R     Signed and Held          Vitals/Pain Today's Vitals   04/03/19 1922 04/03/19 1926 04/03/19 2228 04/03/19 2230  BP:    (!) 142/63  Pulse: 87   78  Resp: 20   16  Temp: (!) 92.9 F (33.8 C)  97.7 F (36.5 C)   TempSrc: Rectal  Oral   SpO2: 100%   98%  Weight:  79.4 kg    Height:  5\' 2"  (1.575 m)    PainSc:  0-No pain      Isolation Precautions No active isolations  Medications Medications  sodium chloride 0.9 % bolus 500 mL (500 mLs Intravenous New Bag/Given 04/03/19 2058)    Mobility walks with person assist High fall risk   Focused Assessments Pulmonary Assessment Handoff:  Lung sounds:   O2 Device: Room Air        R Recommendations: See Admitting Provider Note  Report given to:   Additional Notes:

## 2019-04-03 NOTE — H&P (Signed)
[facility] [place] [facility] NAME: Holly Conner    MR#:  [id number]  DATE OF BIRTH:  1940/10/05  DATE OF ADMISSION:  04/03/2019  PRIMARY CARE PHYSICIAN: Dion Body, MD   REQUESTING/REFERRING PHYSICIAN: Cherylann Banas, MD  CHIEF COMPLAINT:   Chief Complaint  Patient presents with  . Hypoglycemia  . Fall    HISTORY OF PRESENT ILLNESS:  Holly Conner  is a 77 y.o. female who presents with chief complaint as above.  Patient presents the ED after being found down at home unresponsive.  Per EMS her blood glucose was in the 30s when they first found her.  Patient states that she took her insulin this morning, but does not remember if she ate anything or not.  She does not recall anything around the event about why she collapsed.  Here in the ED her troponin was somewhat elevated at 100.  Hospitalist called for admission  PAST MEDICAL HISTORY:   Past Medical History:  Diagnosis Date  . Arthritis   . Depression    controlled  . Diabetes mellitus without complication ([place])    managed well;   [place] Kitchen GERD (gastroesophageal reflux disease)   . Hepatitis   . HLD (hyperlipidemia)   . Hypertension    somewhat controlled  . 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 Polymyalgia rheumatica ([place])   . Sleep apnea   . Stroke [facility])      PAST SURGICAL HISTORY:   Past Surgical History:  Procedure Laterality Date  . ABDOMINAL HYSTERECTOMY    . BILATERAL SALPINGOOPHORECTOMY    . ESOPHAGEAL DILATION    . OOPHORECTOMY    . REDUCTION MAMMAPLASTY Bilateral 2003  . REPLACEMENT TOTAL KNEE Right   . TEE WITHOUT CARDIOVERSION N/A 03/31/2019   Procedure: TRANSESOPHAGEAL ECHOCARDIOGRAM (TEE);  Surgeon: Corey Skains, MD;  Location: [facility] ORS;  Service: Cardiovascular;  Laterality: N/A;  . TONSILLECTOMY AND ADENOIDECTOMY       SOCIAL HISTORY:   Social History   Tobacco Use  . Smoking status: Never Smoker  . Smokeless  tobacco: Never Used  Substance Use Topics  . Alcohol use: No    Alcohol/week: 0.0 standard drinks     FAMILY HISTORY:   Family History  Problem Relation Age of Onset  . CAD Brother   . Kidney cancer Mother   . Breast cancer Cousin      DRUG ALLERGIES:   Allergies  Allergen Reactions  . Iodinated Diagnostic Agents Shortness Of Breath    Only with nuclear stress test per pt Only with nuclear stress test per pt   . Penicillins Rash    Rash at injection site. Pt states she can take Amoxicillin & Keflex   . Escitalopram   . Lithium   . Erythromycin Nausea Only and Nausea And Vomiting    Other reaction(s): Other (See Comments) dehydration     MEDICATIONS AT HOME:   Prior to Admission medications   Medication Sig Start Date End Date Taking? Authorizing Provider  amLODipine (NORVASC) 10 MG tablet Take 1 tablet (10 mg total) by mouth daily. 04/01/19  Yes Vaughan Basta, MD  Ascorbic Acid (VITAMIN C) 1000 MG tablet Take 1,000 mg by mouth daily.   Yes [provider]  aspirin EC 81 MG tablet Take 1 tablet (81 mg total) by mouth daily. 04/01/19 03/31/20 Yes Vaughan Basta, MD  atorvastatin (LIPITOR) 80 MG tablet Take 80 mg by mouth daily.  Yes [provider]  Cyanocobalamin (VITAMIN B-12 PO) Take by mouth.   Yes [provider]  insulin aspart (NOVOLOG FLEXPEN) 100 UNIT/ML FlexPen Inject 7-9 Units into the skin 3 (three) times daily with meals.  12/14/15  Yes [provider]  Insulin Degludec (TRESIBA FLEXTOUCH) 200 UNIT/ML SOPN Inject 30 Units into the skin daily. 04/01/19  Yes Vaughan Basta, MD  liraglutide (VICTOZA) 18 MG/3ML SOPN Victoza 3-Pak 0.6 mg/0.1 mL (18 mg/3 mL) subcutaneous pen injector 09/13/15  Yes [provider]  loratadine (CLARITIN) 10 MG tablet Take 10 mg by mouth daily as needed for allergies.   Yes [provider]  losartan (COZAAR) 50 MG tablet Take 50 mg by mouth daily.   Yes [provider]  Multiple Vitamin (MULTIVITAMIN WITH MINERALS) TABS tablet Take 1 tablet by mouth daily. 04/01/19  Yes Vaughan Basta, MD  pyridOXINE (VITAMIN B-6) 100 MG tablet Take 100 mg by mouth daily.   Yes [provider]  solifenacin (VESICARE) 10 MG tablet Take 1 tablet (10 mg total) by mouth daily. 10/11/18  Yes Stoioff, Ronda Fairly, MD  tamsulosin (FLOMAX) 0.4 MG CAPS capsule Take 1 capsule (0.4 mg total) by mouth at bedtime. 04/01/19  Yes Vaughan Basta, MD    REVIEW OF SYSTEMS:  Review of Systems  Constitutional: Negative for chills, fever, malaise/fatigue and weight loss.  HENT: Negative for ear pain, hearing loss and tinnitus.   Eyes: Negative for blurred vision, double vision, pain and redness.  Respiratory: Negative for cough, hemoptysis and shortness of breath.   Cardiovascular: Negative for chest pain, palpitations, orthopnea and leg swelling.  Gastrointestinal: Negative for abdominal pain, constipation, diarrhea, nausea and vomiting.  Genitourinary: Negative for dysuria, frequency and hematuria.  Musculoskeletal: Positive for falls. Negative for back pain, joint pain and neck pain.  Skin:       No acne, rash, or lesions  Neurological: Positive for loss of consciousness. Negative for dizziness, tremors, focal weakness and weakness.  Endo/Heme/Allergies: Negative for polydipsia. Does not bruise/bleed easily.  Psychiatric/Behavioral: Negative for depression. The patient is not nervous/anxious and does not have insomnia.      VITAL SIGNS:   Vitals:   04/03/19 1922 04/03/19 1926 04/03/19 2228 04/03/19 2230  BP:    (!) 142/63  Pulse: 87   78  Resp: 20   16  Temp: (!) 92.9 F (33.8 C)  97.7 F (36.5 C)   TempSrc: Rectal  Oral   SpO2: 100%   98%  Weight:  79.4 kg    Height:  5\' 2"  (1.575 m)     Wt Readings from Last 3 Encounters:  04/03/19 79.4 kg  04/01/19 84.3 kg  12/20/18 96 kg    PHYSICAL EXAMINATION:  Physical Exam  Vitals  reviewed. Constitutional: She is oriented to person, place, and time. She appears well-developed and well-nourished. No distress.  HENT:  Head: Normocephalic and atraumatic.  Mouth/Throat: Oropharynx is clear and moist.  Eyes: Pupils are equal, round, and reactive to light. Conjunctivae and EOM are normal. No scleral icterus.  Neck: Normal range of motion. Neck supple. No JVD present. No thyromegaly present.  Cardiovascular: Normal rate, regular rhythm and intact distal pulses. Exam reveals no gallop and no friction rub.  No murmur heard. Respiratory: Effort normal and breath sounds normal. No respiratory distress. She has no wheezes. She has no rales.  GI: Soft. Bowel sounds are normal. She exhibits no distension. There is no abdominal tenderness.  Musculoskeletal: Normal range of motion.  General: No edema.     Comments: No arthritis, no gout  Lymphadenopathy:    She has no cervical adenopathy.  Neurological: She is alert and oriented to person, place, and time. No cranial nerve deficit.  No dysarthria, no aphasia  Skin: Skin is warm and dry. No rash noted. No erythema.  Psychiatric: She has a normal mood and affect. Her behavior is normal. Judgment and thought content normal.    LABORATORY PANEL:   CBC Recent Labs  Lab 04/03/19 1953  WBC 8.8  HGB 12.2  HCT 36.3  PLT 362   ------------------------------------------------------------------------------------------------------------------  Chemistries  Recent Labs  Lab 04/03/19 1953  NA 142  K 3.6  CL 107  CO2 23  GLUCOSE 196*  BUN 56*  CREATININE 2.69*  CALCIUM 9.4  AST 78*  ALT 57*  ALKPHOS 66  BILITOT 0.9   ------------------------------------------------------------------------------------------------------------------  Cardiac Enzymes No results for input(s): TROPONINI in the last 168  hours. ------------------------------------------------------------------------------------------------------------------  RADIOLOGY:  Ct Head Wo Contrast  Result Date: 04/03/2019 CLINICAL DATA:  Status post fall. EXAM: CT HEAD WITHOUT CONTRAST TECHNIQUE: Contiguous axial images were obtained from the base of the skull through the vertex without intravenous contrast. COMPARISON:  Brain MRI 03/29/2019 FINDINGS: Brain: Small left-sided ischemic infarcts in the left temporal, parietal and occipital lobes, demonstrated by recent MRI are not well seen on this exam. Small area of hypoattenuation in the subcortical white matter of the right frontal lobe corresponds to previously demonstrated small ischemic infarct. No evidence of acute hemorrhage. Moderate brain parenchymal volume loss and deep white matter microangiopathy. Vascular: Intracranial calcific atherosclerotic disease. Skull: Normal. Negative for fracture or focal lesion. Sinuses/Orbits: No acute finding. Other: None. IMPRESSION: 1. 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 2. Small area of hypoattenuation in the subcortical white matter of the right frontal lobe corresponds to previously demonstrated ischemic infarct. 3. No evidence of acute hemorrhage. 4. Moderate brain parenchymal atrophy and chronic microvascular disease. Electronically Signed   By: Fidela Salisbury M.D.   On: 04/03/2019 21:08   Dg Chest Portable 1 View  Result Date: 04/03/2019 CLINICAL DATA:  Fall.  Shortness of breath. EXAM: PORTABLE CHEST 1 VIEW COMPARISON:  03/27/2019 FINDINGS: Mild cardiomegaly. Lungs clear. No effusions. No acute bony abnormality. IMPRESSION: Mild cardiomegaly.  No active disease. Electronically Signed   By: Rolm Baptise M.D.   On: 04/03/2019 21:05    EKG:   Orders placed or performed during the hospital encounter of 04/03/19  . EKG 12-Lead  . EKG  12-Lead    IMPRESSION AND PLAN:  Principal Problem:   Unresponsive episode -possibly related to her hypoglycemic event, although that also may have been as result of her losing consciousness and not being able to eat after taking her insulin.  Somewhat elevated troponin raises concern for possible arrhythmia or cardiac event.  We will trend her cardiac enzymes tonight, get an echocardiogram and cardiology consult.  Glucose correction as below Active Problems:   Hypoglycemia -she was given treatment by EMS and her glucose has been up and stable since that time.  We will monitor closely   Elevated troponin -see above for work-up   Type 2 diabetes mellitus (HCC) -sliding scale insulin coverage only   HTN (hypertension) -home dose antihypertensives   GERD (gastroesophageal reflux disease) -home dose PPI   HLD (hyperlipidemia) -home dose antilipid   CKD (chronic kidney disease), stage IV ([place]) -  at baseline, avoid nephrotoxins and monitor  Chart review performed and case discussed with ED provider. Labs, imaging and/or ECG reviewed by provider and discussed with patient/family. Management plans discussed with the patient and/or family.  COVID-19 status: Pending  DVT PROPHYLAXIS: SubQ lovenox   GI PROPHYLAXIS:  PPI   ADMISSION STATUS: Observation  CODE STATUS: Full Code Status History    Date Active Date Inactive Code Status Order ID Comments User Context   03/28/2019 0204 04/01/2019 2020 Full Code [id number]  Mayer Camel, NP Inpatient   12/18/2018 2039 12/20/2018 1744 Full Code [id number]  Sela Hua, MD Inpatient   12/19/2015 2325 12/20/2015 1749 Full Code [id number]  Lance Coon, MD Inpatient   Advance Care Planning Activity    Advance Directive Documentation     Most Recent Value  Type of Advance Directive  Healthcare Power of Attorney  Pre-existing out of facility DNR order (yellow form or pink MOST form)  -  "MOST" Form in Place?  -      TOTAL TIME TAKING CARE OF THIS  PATIENT: 40 minutes.   This patient was evaluated in the context of the global COVID-19 pandemic, which necessitated consideration that the patient might be at risk for infection with the SARS-CoV-2 virus that causes COVID-19. Institutional protocols and algorithms that pertain to the evaluation of patients at risk for COVID-19 are in a state of rapid change based on information released by regulatory bodies including the CDC and federal and state organizations. These policies and algorithms were followed to the best of this provider's knowledge to date during the patient's care at this facility.  Ethlyn Daniels 04/03/2019, 11:10 PM  [facility]  Office  [phone]  CC: Primary care physician; Dion Body, MD  Note:  This document was prepared using Dragon voice recognition software and may include unintentional dictation errors.

## 2019-04-03 NOTE — ED Provider Notes (Signed)
[facility] Emergency Department Provider Note ____________________________________________   First MD Initiated Contact with Patient 04/03/19 1941     (approximate)  I have reviewed the triage vital signs and the nursing notes.   HISTORY  Chief Complaint Hypoglycemia and Fall  Level 5 caveat: History of present illness limited due to altered mental status  HPI Holly Conner is a 78 y.o. female with PMH as noted below and status post a recent stroke who presents with hypoglycemia and weakness.  Per EMS, the patient apparently fell while getting out of bed earlier today and was found by a neighbor lying on the floor and shivering.  The patient stated that she took her insulin and is not sure if she ate breakfast.  The patient states that she feels somewhat weak but otherwise denies any complaints at this time.  Past Medical History:  Diagnosis Date   Arthritis    Depression    controlled   Diabetes mellitus without complication ([place])    managed well;    GERD (gastroesophageal reflux disease)    Hepatitis    HLD (hyperlipidemia)    Hypertension    somewhat controlled   Kidney failure    4th stage; but bloodwork is stable;    Polymyalgia rheumatica ([place])    Sleep apnea    Stroke [facility])     Patient Active Problem List   Diagnosis Date Noted   Unresponsive episode 04/03/2019   Hypoglycemia 04/03/2019   Weakness generalized 03/28/2019   Hypertensive urgency 12/18/2018   Stroke ([place]) 12/19/2015   Type 2 diabetes mellitus ([place]) 12/19/2015   HTN (hypertension) 12/19/2015   GERD (gastroesophageal reflux disease) 12/19/2015   HLD (hyperlipidemia) 12/19/2015   CKD (chronic kidney disease), stage IV ([place]) 12/19/2015    Past Surgical History:  Procedure Laterality Date   ABDOMINAL HYSTERECTOMY     BILATERAL SALPINGOOPHORECTOMY     ESOPHAGEAL DILATION     OOPHORECTOMY     REDUCTION MAMMAPLASTY Bilateral 2003    REPLACEMENT TOTAL KNEE Right    TEE WITHOUT CARDIOVERSION N/A 03/31/2019   Procedure: TRANSESOPHAGEAL ECHOCARDIOGRAM (TEE);  Surgeon: Corey Skains, MD;  Location: [facility] ORS;  Service: Cardiovascular;  Laterality: N/A;   TONSILLECTOMY AND ADENOIDECTOMY      Prior to Admission medications   Medication Sig Start Date End Date Taking? Authorizing Provider  amLODipine (NORVASC) 10 MG tablet Take 1 tablet (10 mg total) by mouth daily. 04/01/19  Yes Vaughan Basta, MD  Ascorbic Acid (VITAMIN C) 1000 MG tablet Take 1,000 mg by mouth daily.   Yes [provider]  aspirin EC 81 MG tablet Take 1 tablet (81 mg total) by mouth daily. 04/01/19 03/31/20 Yes Vaughan Basta, MD  atorvastatin (LIPITOR) 80 MG tablet Take 80 mg by mouth daily.    Yes [provider]  Cyanocobalamin (VITAMIN B-12 PO) Take by mouth.   Yes [provider]  insulin aspart (NOVOLOG FLEXPEN) 100 UNIT/ML FlexPen Inject 7-9 Units into the skin 3 (three) times daily with meals.  12/14/15  Yes [provider]  Insulin Degludec (TRESIBA FLEXTOUCH) 200 UNIT/ML SOPN Inject 30 Units into the skin daily. 04/01/19  Yes Vaughan Basta, MD  liraglutide (VICTOZA) 18 MG/3ML SOPN Victoza 3-Pak 0.6 mg/0.1 mL (18 mg/3 mL) subcutaneous pen injector 09/13/15  Yes [provider]  loratadine (CLARITIN) 10 MG tablet Take 10 mg by mouth daily as needed for allergies.   Yes [provider]  losartan (COZAAR) 50 MG tablet Take 50 mg  by mouth daily.   Yes [provider]  Multiple Vitamin (MULTIVITAMIN WITH MINERALS) TABS tablet Take 1 tablet by mouth daily. 04/01/19  Yes Vaughan Basta, MD  pyridOXINE (VITAMIN B-6) 100 MG tablet Take 100 mg by mouth daily.   Yes [provider]  solifenacin (VESICARE) 10 MG tablet Take 1 tablet (10 mg total) by mouth daily. 10/11/18  Yes Stoioff, Ronda Fairly, MD  tamsulosin (FLOMAX) 0.4 MG CAPS capsule Take 1 capsule (0.4 mg total) by  mouth at bedtime. 04/01/19  Yes Vaughan Basta, MD    Allergies Iodinated diagnostic agents, Penicillins, Escitalopram, Lithium, and Erythromycin  Family History  Problem Relation Age of Onset   CAD Brother    Kidney cancer Mother    Breast cancer Cousin     Social History Social History   Tobacco Use   Smoking status: Never Smoker   Smokeless tobacco: Never Used  Substance Use Topics   Alcohol use: No    Alcohol/week: 0.0 standard drinks   Drug use: No    Review of Systems Level 5 caveat: Review of systems limited due to altered mental status. Constitutional: Positive for weakness. Cardiovascular: Denies chest pain. Respiratory: Denies shortness of breath. Gastrointestinal: No vomiting. Musculoskeletal: Negative for back pain. Neurological: Negative for headache.   ____________________________________________   PHYSICAL EXAM:  VITAL SIGNS: ED Triage Vitals  Enc Vitals Group     BP --      Pulse Rate 04/03/19 1922 87     Resp 04/03/19 1922 20     Temp 04/03/19 1922 (!) 92.9 F (33.8 C)     Temp Source 04/03/19 1922 Rectal     SpO2 04/03/19 1922 100 %     Weight 04/03/19 1926 175 lb (79.4 kg)     Height 04/03/19 1926 5\' 2"  (1.575 m)     Head Circumference --      Peak Flow --      Pain Score 04/03/19 1926 0     Pain Loc --      Pain Edu? --      Excl. in [place]? --     Constitutional: Alert, oriented x2.  Mildly confused.  Relatively comfortable appearing. Eyes: Conjunctivae are normal.  EOMI.  PERRLA. Head: Atraumatic. Nose: No congestion/rhinnorhea. Mouth/Throat: Mucous membranes are moist.   Neck: Normal range of motion.  No midline spinal tenderness. Cardiovascular: Normal rate, regular rhythm. Grossly normal heart sounds.  Good peripheral circulation. Respiratory: Normal respiratory effort.  No retractions. Lungs CTAB. Gastrointestinal: Soft and nontender. No distention.  Genitourinary: No flank tenderness. Musculoskeletal: No lower  extremity edema.  Extremities warm and well perfused.  Full range of motion to all extremities. Neurologic:  Normal speech and language.  Motor intact in all extremities.  No gross focal neurologic deficits are appreciated.  Skin:  Skin is warm and dry. No rash noted. Psychiatric: Calm and cooperative.  ____________________________________________   LABS (all labs ordered are listed, but only abnormal results are displayed)  Labs Reviewed  GLUCOSE, CAPILLARY - Abnormal; Notable for the following components:      Result Value   Glucose-Capillary 108 (*)    All other components within normal limits  COMPREHENSIVE METABOLIC PANEL - Abnormal; Notable for the following components:   Glucose, Bld 196 (*)    BUN 56 (*)    Creatinine, Ser 2.69 (*)    AST 78 (*)    ALT 57 (*)    GFR calc non Af Amer 16 (*)    GFR  calc Af Amer 19 (*)    All other components within normal limits  URINALYSIS, COMPLETE (UACMP) WITH MICROSCOPIC - Abnormal; Notable for the following components:   Color, Urine YELLOW (*)    APPearance HAZY (*)    Glucose, UA 150 (*)    Hgb urine dipstick SMALL (*)    Ketones, ur 5 (*)    Protein, ur 100 (*)    All other components within normal limits  TROPONIN I (HIGH SENSITIVITY) - Abnormal; Notable for the following components:   Troponin I (High Sensitivity) 100 (*)    All other components within normal limits  CULTURE, BLOOD (ROUTINE X 2)  CULTURE, BLOOD (ROUTINE X 2)  SARS CORONAVIRUS 2 (HOSPITAL ORDER, [facility])  CBC  LACTIC ACID, PLASMA  LACTIC ACID, PLASMA  CBG MONITORING, ED  TROPONIN I (HIGH SENSITIVITY)   ____________________________________________  EKG  ED ECG REPORT I, Arta Silence, the attending physician, personally viewed and interpreted this ECG.  Date: 04/03/2019 EKG Time: 1912 Rate: 84 Rhythm: normal sinus rhythm QRS Axis: normal Intervals: Short PR, LBBB ST/T Wave abnormalities: Possible nonspecific ST  abnormalities although difficult to interpret due to poor baseline from shivering Narrative Interpretation: Nonspecific abnormalities with no evidence of acute ischemia  ____________________________________________  RADIOLOGY  CT head: No ICH CXR: No focal infiltrate  ____________________________________________   PROCEDURES  Procedure(s) performed: No  Procedures  Critical Care performed: Yes  CRITICAL CARE Performed by: Arta Silence   Total critical care time: 30 minutes  Critical care time was exclusive of separately billable procedures and treating other patients.  Critical care was necessary to treat or prevent imminent or life-threatening deterioration.  Critical care was time spent personally by me on the following activities: development of treatment plan with patient and/or surrogate as well as nursing, discussions with consultants, evaluation of patient's response to treatment, examination of patient, obtaining history from patient or surrogate, ordering and performing treatments and interventions, ordering and review of laboratory studies, ordering and review of radiographic studies, pulse oximetry and re-evaluation of patient's condition. ____________________________________________   INITIAL IMPRESSION / ASSESSMENT AND PLAN / ED COURSE  Pertinent labs & imaging results that were available during my care of the patient were reviewed by me and considered in my medical decision making (see chart for details).  78 year old female with PMH as noted above presents after she was found lying on the floor of her home this afternoon.  The patient stated EMS that she took her insulin and is unsure if she ate.  She was found to be hypoglycemic to the 30s and was given glucagon while in route.  Here, the patient states that she feels somewhat weak but otherwise denies any complaints.  She has no pain at this time.  I reviewed the past medical records in [place].  The  patient was admitted earlier this month and discharged 2 days ago with a CVA and altered mental status.  On exam, the patient is hypothermic.  Her other vital signs are normal.  She has no focal neurologic findings and no visible trauma.  The remainder of the exam is as described above.  Overall I suspect most likely the patient was hypoglycemic due to her insulin and possibly not eating.  Given her mild confusion, the recent stroke, and the possibility of head trauma I will obtain a CT head as well as lab work-up.  I anticipate likely admission.  ----------------------------------------- 9:27 PM on 04/03/2019 -----------------------------------------  CT head shows  no traumatic findings.  The lab work-up shows elevated creatinine which is only slightly worsened from when the patient left the hospital.  She has no elevated WBC count or lactic acid.  Her urinalysis is negative.  Troponin is slightly elevated.  The patient will require admission due to the hypothermia and for continued monitoring.  I signed her out to the hospitalist Dr. Jannifer Franklin.  ____________________________________________   FINAL CLINICAL IMPRESSION(S) / ED DIAGNOSES  Final diagnoses:  Hypothermia, initial encounter  Hypoglycemia      NEW MEDICATIONS STARTED DURING THIS VISIT:  New Prescriptions   No medications on file     Note:  This document was prepared using Dragon voice recognition software and may include unintentional dictation errors.    Arta Silence, MD 04/03/19 2128

## 2019-04-03 NOTE — ED Notes (Signed)
Pt provided with water at this time time per pt's request.

## 2019-04-03 NOTE — ED Notes (Signed)
Unable to obtain 2nd set of blood cultures after 2 additional IV attempts and an attempted straight stick. Dr Cherylann Banas notified.

## 2019-04-03 NOTE — ED Notes (Signed)
Fall alarm posey in place/turned on.

## 2019-04-03 NOTE — ED Triage Notes (Signed)
Pt arrives to  ED from home via [facility] with c/c of a mechanical fall and hypoglycemia. Pt was discharged from the hospital 6 days ago after having a stroke. Pt fell while getting out of bed earlier today. EMS found her laying on the floor shivering. Her initial CBG was 32. After 3 mg instant glucose and 1 mg glucagon repeat CBG was 96. Transport vitals were 194/92, p80, O2 sat 95%. Pt is diabetic and took her insulin this morning but cant remember if she ate after that. Upon arrival, pt A&O to self and place but not time or situation. Initial rectal temp of 92.9. Dr Cherylann Banas notified.

## 2019-04-04 ENCOUNTER — Other Ambulatory Visit: Payer: Self-pay

## 2019-04-04 DIAGNOSIS — R4189 Other symptoms and signs involving cognitive functions and awareness: Secondary | ICD-10-CM | POA: Diagnosis present

## 2019-04-04 DIAGNOSIS — Z20828 Contact with and (suspected) exposure to other viral communicable diseases: Secondary | ICD-10-CM | POA: Diagnosis present

## 2019-04-04 DIAGNOSIS — E785 Hyperlipidemia, unspecified: Secondary | ICD-10-CM | POA: Diagnosis present

## 2019-04-04 DIAGNOSIS — Z0489 Encounter for examination and observation for other specified reasons: Secondary | ICD-10-CM | POA: Diagnosis not present

## 2019-04-04 DIAGNOSIS — J329 Chronic sinusitis, unspecified: Secondary | ICD-10-CM | POA: Diagnosis present

## 2019-04-04 DIAGNOSIS — E876 Hypokalemia: Secondary | ICD-10-CM | POA: Diagnosis present

## 2019-04-04 DIAGNOSIS — Z794 Long term (current) use of insulin: Secondary | ICD-10-CM | POA: Diagnosis not present

## 2019-04-04 DIAGNOSIS — Z79899 Other long term (current) drug therapy: Secondary | ICD-10-CM | POA: Diagnosis not present

## 2019-04-04 DIAGNOSIS — G4733 Obstructive sleep apnea (adult) (pediatric): Secondary | ICD-10-CM | POA: Diagnosis present

## 2019-04-04 DIAGNOSIS — Z96651 Presence of right artificial knee joint: Secondary | ICD-10-CM | POA: Diagnosis present

## 2019-04-04 DIAGNOSIS — E11649 Type 2 diabetes mellitus with hypoglycemia without coma: Secondary | ICD-10-CM | POA: Diagnosis present

## 2019-04-04 DIAGNOSIS — R296 Repeated falls: Secondary | ICD-10-CM | POA: Diagnosis present

## 2019-04-04 DIAGNOSIS — M1990 Unspecified osteoarthritis, unspecified site: Secondary | ICD-10-CM | POA: Diagnosis present

## 2019-04-04 DIAGNOSIS — E162 Hypoglycemia, unspecified: Secondary | ICD-10-CM | POA: Diagnosis present

## 2019-04-04 DIAGNOSIS — K219 Gastro-esophageal reflux disease without esophagitis: Secondary | ICD-10-CM | POA: Diagnosis present

## 2019-04-04 DIAGNOSIS — E1122 Type 2 diabetes mellitus with diabetic chronic kidney disease: Secondary | ICD-10-CM | POA: Diagnosis present

## 2019-04-04 DIAGNOSIS — G9341 Metabolic encephalopathy: Secondary | ICD-10-CM | POA: Diagnosis present

## 2019-04-04 DIAGNOSIS — Z7982 Long term (current) use of aspirin: Secondary | ICD-10-CM | POA: Diagnosis not present

## 2019-04-04 DIAGNOSIS — I129 Hypertensive chronic kidney disease with stage 1 through stage 4 chronic kidney disease, or unspecified chronic kidney disease: Secondary | ICD-10-CM | POA: Diagnosis present

## 2019-04-04 DIAGNOSIS — E1165 Type 2 diabetes mellitus with hyperglycemia: Secondary | ICD-10-CM | POA: Diagnosis not present

## 2019-04-04 DIAGNOSIS — G4730 Sleep apnea, unspecified: Secondary | ICD-10-CM | POA: Diagnosis present

## 2019-04-04 DIAGNOSIS — M353 Polymyalgia rheumatica: Secondary | ICD-10-CM | POA: Diagnosis present

## 2019-04-04 DIAGNOSIS — Z880 Allergy status to penicillin: Secondary | ICD-10-CM | POA: Diagnosis not present

## 2019-04-04 DIAGNOSIS — I69319 Unspecified symptoms and signs involving cognitive functions following cerebral infarction: Secondary | ICD-10-CM | POA: Diagnosis not present

## 2019-04-04 DIAGNOSIS — R413 Other amnesia: Secondary | ICD-10-CM | POA: Diagnosis not present

## 2019-04-04 DIAGNOSIS — T68XXXA Hypothermia, initial encounter: Secondary | ICD-10-CM | POA: Diagnosis present

## 2019-04-04 DIAGNOSIS — F0390 Unspecified dementia without behavioral disturbance: Secondary | ICD-10-CM | POA: Diagnosis present

## 2019-04-04 DIAGNOSIS — N184 Chronic kidney disease, stage 4 (severe): Secondary | ICD-10-CM | POA: Diagnosis present

## 2019-04-04 LAB — BASIC METABOLIC PANEL
Anion gap: 10 (ref 5–15)
BUN: 55 mg/dL — ABNORMAL HIGH (ref 8–23)
CO2: 20 mmol/L — ABNORMAL LOW (ref 22–32)
Calcium: 8.5 mg/dL — ABNORMAL LOW (ref 8.9–10.3)
Chloride: 109 mmol/L (ref 98–111)
Creatinine, Ser: 2.44 mg/dL — ABNORMAL HIGH (ref 0.44–1.00)
GFR calc Af Amer: 21 mL/min — ABNORMAL LOW (ref >60)
GFR calc non Af Amer: 18 mL/min — ABNORMAL LOW (ref >60)
Glucose, Bld: 138 mg/dL — ABNORMAL HIGH (ref 70–99)
Potassium: 3.3 mmol/L — ABNORMAL LOW (ref 3.5–5.1)
Sodium: 139 mmol/L (ref 135–145)

## 2019-04-04 LAB — GLUCOSE, CAPILLARY
Glucose-Capillary: 102 mg/dL — ABNORMAL HIGH (ref 70–99)
Glucose-Capillary: 130 mg/dL — ABNORMAL HIGH (ref 70–99)
Glucose-Capillary: 132 mg/dL — ABNORMAL HIGH (ref 70–99)
Glucose-Capillary: 246 mg/dL — ABNORMAL HIGH (ref 70–99)
Glucose-Capillary: 304 mg/dL — ABNORMAL HIGH (ref 70–99)
Glucose-Capillary: 348 mg/dL — ABNORMAL HIGH (ref 70–99)
Glucose-Capillary: 98 mg/dL (ref 70–99)

## 2019-04-04 LAB — CBC
HCT: 30.3 % — ABNORMAL LOW (ref 36.0–46.0)
Hemoglobin: 10.5 g/dL — ABNORMAL LOW (ref 12.0–15.0)
MCH: 29.5 pg (ref 26.0–34.0)
MCHC: 34.7 g/dL (ref 30.0–36.0)
MCV: 85.1 fL (ref 80.0–100.0)
Platelets: 299 10*3/uL (ref 150–400)
RBC: 3.56 MIL/uL — ABNORMAL LOW (ref 3.87–5.11)
RDW: 14.3 % (ref 11.5–15.5)
WBC: 11.4 10*3/uL — ABNORMAL HIGH (ref 4.0–10.5)
nRBC: 0 % (ref 0.0–0.2)

## 2019-04-04 LAB — TROPONIN I (HIGH SENSITIVITY): Troponin I (High Sensitivity): 109 ng/L (ref <18)

## 2019-04-04 MED ORDER — FLEET ENEMA 7-19 GM/118ML RE ENEM
1.0000 | ENEMA | Freq: Once | RECTAL | Status: AC
  Administered 2019-04-04: 1 via RECTAL

## 2019-04-04 MED ORDER — INSULIN ASPART 100 UNIT/ML ~~LOC~~ SOLN
0.0000 [IU] | Freq: Four times a day (QID) | SUBCUTANEOUS | Status: DC
  Administered 2019-04-04: 3 [IU] via SUBCUTANEOUS
  Administered 2019-04-04: 1 [IU] via SUBCUTANEOUS
  Administered 2019-04-04: 7 [IU] via SUBCUTANEOUS
  Administered 2019-04-05: 5 [IU] via SUBCUTANEOUS
  Administered 2019-04-05: 2 [IU] via SUBCUTANEOUS
  Filled 2019-04-04 (×5): qty 1

## 2019-04-04 MED ORDER — AMLODIPINE BESYLATE 10 MG PO TABS
10.0000 mg | ORAL_TABLET | Freq: Every day | ORAL | Status: DC
  Administered 2019-04-04 – 2019-04-07 (×3): 10 mg via ORAL
  Filled 2019-04-04 (×3): qty 1

## 2019-04-04 MED ORDER — LOSARTAN POTASSIUM 50 MG PO TABS
50.0000 mg | ORAL_TABLET | Freq: Every day | ORAL | Status: DC
  Administered 2019-04-04 – 2019-04-07 (×3): 50 mg via ORAL
  Filled 2019-04-04 (×3): qty 1

## 2019-04-04 MED ORDER — ONDANSETRON HCL 4 MG PO TABS: 4.0000 mg | ORAL_TABLET | Freq: Four times a day (QID) | ORAL | Status: DC | PRN

## 2019-04-04 MED ORDER — ACETAMINOPHEN 650 MG RE SUPP: 650.0000 mg | Freq: Four times a day (QID) | RECTAL | Status: DC | PRN

## 2019-04-04 MED ORDER — OXYCODONE HCL 5 MG PO TABS: 5.0000 mg | ORAL_TABLET | ORAL | Status: DC | PRN

## 2019-04-04 MED ORDER — ATORVASTATIN CALCIUM 20 MG PO TABS
80.0000 mg | ORAL_TABLET | Freq: Every day | ORAL | Status: DC
  Administered 2019-04-04 – 2019-04-07 (×3): 80 mg via ORAL
  Filled 2019-04-04: qty 1
  Filled 2019-04-04: qty 4
  Filled 2019-04-04: qty 1
  Filled 2019-04-04: qty 4

## 2019-04-04 MED ORDER — ASPIRIN EC 81 MG PO TBEC
81.0000 mg | DELAYED_RELEASE_TABLET | Freq: Every day | ORAL | Status: DC
  Administered 2019-04-04 – 2019-04-07 (×3): 81 mg via ORAL
  Filled 2019-04-04 (×3): qty 1

## 2019-04-04 MED ORDER — ONDANSETRON HCL 4 MG/2ML IJ SOLN: 4.0000 mg | Freq: Four times a day (QID) | INTRAMUSCULAR | Status: DC | PRN

## 2019-04-04 MED ORDER — ACETAMINOPHEN 325 MG PO TABS: 650.0000 mg | ORAL_TABLET | Freq: Four times a day (QID) | ORAL | Status: DC | PRN

## 2019-04-04 MED ORDER — POLYETHYLENE GLYCOL 3350 17 G PO PACK
17.0000 g | PACK | Freq: Every day | ORAL | Status: DC
  Administered 2019-04-04 – 2019-04-07 (×3): 17 g via ORAL
  Filled 2019-04-04 (×3): qty 1

## 2019-04-04 MED ORDER — HEPARIN SODIUM (PORCINE) 5000 UNIT/ML IJ SOLN
5000.0000 [IU] | Freq: Three times a day (TID) | INTRAMUSCULAR | Status: DC
  Administered 2019-04-04 – 2019-04-07 (×10): 5000 [IU] via SUBCUTANEOUS
  Filled 2019-04-04 (×10): qty 1

## 2019-04-04 NOTE — Plan of Care (Signed)
  Problem: Elimination: Goal: Will not experience complications related to bowel motility Outcome: Completed/Met

## 2019-04-04 NOTE — Progress Notes (Signed)
[facility] NAME: Griselda Ropp    MR#:  [id number]  DATE OF BIRTH:  [birth year]  SUBJECTIVE: Patient is admitted for hypoglycemia, fall.  Patient was found unresponsive at home, blood sugar found to be 30.  Blood sugar right now is more than 100 now spoke with patient's roommate Apolonio Schneiders in detail.  Patient has been admitted recently for stroke, discharged recently..  According to caregiver Apolonio Schneiders she never got physical therapy and also her insulin prescriptions are called into mail-in pharmacy so she is very upset about that.  CHIEF COMPLAINT:   Chief Complaint  Patient presents with  . Hypoglycemia  . Fall  Recently admitted for embolic stroke, discharged 3 days ago with home health, according to [facility] health did not start, patient ended up coming to the hospital again. REVIEW OF SYSTEMS:   ROS  Due to late effect of stroke patient is confused on and off.  Main complaint today is constipation.  CONSTITUTIONAL: No fever, fatigue or weakness.  EYES: No blurred or double vision.  EARS, NOSE, AND THROAT: No tinnitus or ear pain.  RESPIRATORY: No cough, shortness of breath, wheezing or hemoptysis.  CARDIOVASCULAR: No chest pain, orthopnea, edema.  GASTROINTESTINAL: No nausea, vomiting, diarrhea or abdominal pain.  GENITOURINARY: No dysuria, hematuria.  ENDOCRINE: No polyuria, nocturia,  HEMATOLOGY: No anemia, easy bruising or bleeding SKIN: No rash or lesion. MUSCULOSKELETAL: No joint pain or arthritis.   NEUROLOGIC: No tingling, numbness, weakness.  PSYCHIATRY: No anxiety or depression.   DRUG ALLERGIES:   Allergies  Allergen Reactions  . Iodinated Diagnostic Agents Shortness Of Breath    Only with nuclear stress test per pt Only with nuclear stress test per pt   . Penicillins Rash    Rash at injection site. Pt states she can take Amoxicillin & Keflex   . Escitalopram   . Lithium   . Erythromycin Nausea  Only and Nausea And Vomiting    Other reaction(s): Other (See Comments) dehydration     VITALS:  Blood pressure (!) 155/52, pulse 72, temperature 97.6 F (36.4 C), temperature source Oral, resp. rate 20, height 5\' 1"  (1.549 m), weight 86.9 kg, SpO2 99 %.  PHYSICAL EXAMINATION:  GENERAL:  78 y.o.-year-old patient lying in the bed with no acute distress.  Patient has waxing and waning alertness, according to [facility] patient does this at home as well.  Her cognition is affected due to stroke and advanced age. EYES: Pupils equal, round, reactive to light . No scleral icterus. Extraocular muscles intact.  HEENT: Head atraumatic, normocephalic. Oropharynx and nasopharynx clear.  NECK:  Supple, no jugular venous distention. No thyroid enlargement, no tenderness.  LUNGS: Normal breath sounds bilaterally, no wheezing, rales,rhonchi or crepitation. No use of accessory muscles of respiration.  CARDIOVASCULAR: S1, S2 normal. No murmurs, rubs, or gallops.  ABDOMEN: Soft, nontender, nondistended. Bowel sounds present. No organomegaly or mass.  EXTREMITIES: No pedal edema, cyanosis, or clubbing.  NEUROLOGIC: Cranial nerves II through XII are intact. Muscle strength 5/5 in all extremities.  Has generalized weakness. sensation intact. Gait not checked.  PSYCHIATRIC: Alert, awake, oriented x3.  SKIN: No obvious rash, lesion, or ulcer.    LABORATORY PANEL:   CBC Recent Labs  Lab 04/04/19 0110  WBC 11.4*  HGB 10.5*  HCT 30.3*  PLT 299   ------------------------------------------------------------------------------------------------------------------  Chemistries  Recent Labs  Lab 04/03/19 1953 04/04/19 0110  NA 142 139  K 3.6 3.3*  CL 107  109  CO2 23 20*  GLUCOSE 196* 138*  BUN 56* 55*  CREATININE 2.69* 2.44*  CALCIUM 9.4 8.5*  AST 78*  --   ALT 57*  --   ALKPHOS 66  --   BILITOT 0.9  --     ------------------------------------------------------------------------------------------------------------------  Cardiac Enzymes No results for input(s): TROPONINI in the last 168 hours. ------------------------------------------------------------------------------------------------------------------  RADIOLOGY:  Ct Head Wo Contrast  Result Date: 04/03/2019 CLINICAL DATA:  Status post fall. EXAM: CT HEAD WITHOUT CONTRAST TECHNIQUE: Contiguous axial images were obtained from the base of the skull through the vertex without intravenous contrast. COMPARISON:  Brain MRI 03/29/2019 FINDINGS: Brain: Small left-sided ischemic infarcts in the left temporal, parietal and occipital lobes, demonstrated by recent MRI are not well seen on this exam. Small area of hypoattenuation in the subcortical white matter of the right frontal lobe corresponds to previously demonstrated small ischemic infarct. No evidence of acute hemorrhage. Moderate brain parenchymal volume loss and deep white matter microangiopathy. Vascular: Intracranial calcific atherosclerotic disease. Skull: Normal. Negative for fracture or focal lesion. Sinuses/Orbits: No acute finding. Other: None. IMPRESSION: 1. 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 2. Small area of hypoattenuation in the subcortical white matter of the right frontal lobe corresponds to previously demonstrated ischemic infarct. 3. No evidence of acute hemorrhage. 4. Moderate brain parenchymal atrophy and chronic microvascular disease. Electronically Signed   By: Fidela Salisbury M.D.   On: 04/03/2019 21:08   Dg Chest Portable 1 View  Result Date: 04/03/2019 CLINICAL DATA:  Fall.  Shortness of breath. EXAM: PORTABLE CHEST 1 VIEW COMPARISON:  03/27/2019 FINDINGS: Mild cardiomegaly. Lungs clear. No effusions. No acute bony abnormality. IMPRESSION: Mild cardiomegaly.  No  active disease. Electronically Signed   By: Rolm Baptise M.D.   On: 04/03/2019 21:05    EKG:   Orders placed or performed during the hospital encounter of 04/03/19  . EKG 12-Lead  . EKG 12-Lead    ASSESSMENT AND PLAN:   78 year old female patient with recent admission for embolic CVA, diabetes mellitus type 2, GERD, CKD stage III currently admitted because of mechanical fall with hypoglycemia./ #1.  Metabolic encephalopathy secondary to hypoglycemia: Resolved, blood sugar improved from 32 on admission , hypoglycemia is corrected    hold long-acting insulin, spoke with patient's caregiver Apolonio Schneiders and she is not sure what insulin she received yesterday, she will call us back in couple of hours after she gets up to work.  Patient does not remember what insulin she is taking at home currently.  Consult diabetes coordinator.   2.  Late effects of embolic CVA, recently admitted for embolic CVA status post TEE, recently admitted and discharged on August 4, continue aspirin, statins, TEE is negative except plaque in ascending aorta.  Seen by neurology also.  Patient does have cognitive deficit, caregiver Apolonio Schneiders is wondering if patient can get competency eval .    #3. CKD stage IV, diabetic nephropathy, stable renal function.  Avoid nephrotoxic agents.   #4 hypokalemia, replace potassium.  #5.  Deconditioning, physical therapy evaluation, patient was discharged with home health physical therapy during last admission but Apolonio Schneiders said that she did not get any home physical therapy started yet.  All the records are reviewed and case discussed with Care Management/Social Workerr. Management plans discussed with the patient, family and they are in agreement.  CODE STATUS: Full code  TOTAL TIME TAKING CARE OF THIS  PATIENT: 3minutes.  More than 50% time spent in counseling, coordination of care Spoke with patient's caregiver Apolonio Schneiders extensively, Apolonio Schneiders is concerned about her decision making and  wanted to see if she can be her power of attorney, competency evaluation orders placed   POSSIBLE D/C IN 2-3 DAYS, DEPENDING ON CLINICAL CONDITION.   Epifanio Lesches M.D on 04/04/2019 at 11:04 AM  Between 7am to 6pm - Pager - [phone]  After 6pm go to [email or web address] - password [email or web address] [facility]  Office  [phone]  CC: Primary care physician; Dion Body, MD   Note: This dictation was prepared with Dragon dictation along with smaller phrase technology. Any transcriptional errors that result from this process are unintentional.
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
## 2019-04-04 NOTE — TOC Initial Note (Signed)
Transition of Care [facility]) - Initial/Assessment Note    Patient Details  Name: Holly Conner MRN: [id number] Date of Birth: 01-Dec-1940  [facility] CM/SW Contact:    Katrina Stack, RN Phone Number: 04/04/2019, 4:58 PM  Clinical Narrative:            Patient placed in observation for after being found down at home.  Blood sugar in the 30s.  CM notified by [facility] that agency received referral for RN PT Aide SW and 8/4 at discharge from [facility].  Staff made calls to schedule initial visit but was declined until 8/7.  An alert was sent to patient's PCP notifying that patient had delayed visit. When RN attempted visit, found she had returned to hospital.         Patient Goals and CMS Choice        Expected Discharge Plan and Services           Expected Discharge Date: 04/05/19                                    Prior Living Arrangements/Services                       Activities of Daily Living Home Assistive Devices/Equipment: Gilford Rile (specify type), CBG Meter ADL Screening (condition at time of admission) Patient's cognitive ability adequate to safely complete daily activities?: No Is the patient deaf or have difficulty hearing?: Yes Does the patient have difficulty seeing, even when wearing glasses/contacts?: No Does the patient have difficulty concentrating, remembering, or making decisions?: Yes Patient able to express need for assistance with ADLs?: Yes Does the patient have difficulty dressing or bathing?: Yes Independently performs ADLs?: No Communication: Needs assistance Is this a change from baseline?: Change from baseline, expected to last <3 days Dressing (OT): Needs assistance Is this a change from baseline?: Change from baseline, expected to last <3days Grooming: Needs assistance Is this a change from baseline?: Change from baseline, expected to last <3 days Feeding: Needs assistance(assiat with set up) Is this a change from  baseline?: Change from baseline, expected to last <3 days Bathing: Needs assistance Is this a change from baseline?: Change from baseline, expected to last <3 days Toileting: Needs assistance Is this a change from baseline?: Change from baseline, expected to last <3 days In/Out Bed: Needs assistance Is this a change from baseline?: Change from baseline, expected to last <3 days Walks in Home: Needs assistance Is this a change from baseline?: Change from baseline, expected to last <3 days Does the patient have difficulty walking or climbing stairs?: Yes Weakness of Legs: Both Weakness of Arms/Hands: None  Permission Sought/Granted                  Emotional Assessment              Admission diagnosis:  Hypoglycemia [E16.2] Hypothermia, initial encounter [T68.XXXA] Patient Active Problem List   Diagnosis Date Noted  . Unresponsive 04/04/2019  . Unresponsive episode 04/03/2019  . Hypoglycemia 04/03/2019  . Elevated troponin 04/03/2019  . Weakness generalized 03/28/2019  . Hypertensive urgency 12/18/2018  . Stroke ([place]) 12/19/2015  . Type 2 diabetes mellitus ([place]) 12/19/2015  . HTN (hypertension) 12/19/2015  . GERD (gastroesophageal reflux disease) 12/19/2015  . HLD (hyperlipidemia) 12/19/2015  . CKD (chronic kidney disease), stage IV ([place]) 12/19/2015   PCP:  Dion Body, MD  Pharmacy:   [facility] [place] [address] [place] [address] Phone: [phone] Fax: [phone] ([address]), [place] - [place]) [place] [address] Phone: [phone] Fax: [phone]     Social Determinants of Health (SDOH) Interventions    Readmission Risk Interventions No flowsheet data found.

## 2019-04-04 NOTE — Consult Note (Signed)
Reason for Consult: CVA hypertension syncope Referring Physician: Dr. Lance Coon hospitalist Dr. Netty Starring primary  Holly Conner is an 78 y.o. female.  HPI: Patient history of diabetes hypertension GERD had a syncopal episode had low glucose as well patient was found to have CVA by MRI then referred to cardiology for evaluation. Denies significant chest pain denies any palpitations or tachycardia.  Complains of mild headache some numbness had borderline troponins a cardiology consultation was then recommended  Past Medical History:  Diagnosis Date  . Arthritis   . Depression    controlled  . Diabetes mellitus without complication ([place])    managed well;   Marland Kitchen GERD (gastroesophageal reflux disease)   . Hepatitis   . HLD (hyperlipidemia)   . Hypertension    somewhat controlled  . 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 Polymyalgia rheumatica ([place])   . Sleep apnea   . Stroke [facility])     Past Surgical History:  Procedure Laterality Date  . ABDOMINAL HYSTERECTOMY    . BILATERAL SALPINGOOPHORECTOMY    . ESOPHAGEAL DILATION    . OOPHORECTOMY    . REDUCTION MAMMAPLASTY Bilateral 2003  . REPLACEMENT TOTAL KNEE Right   . TEE WITHOUT CARDIOVERSION N/A 03/31/2019   Procedure: TRANSESOPHAGEAL ECHOCARDIOGRAM (TEE);  Surgeon: Corey Skains, MD;  Location: [facility] ORS;  Service: Cardiovascular;  Laterality: N/A;  . TONSILLECTOMY AND ADENOIDECTOMY      Family History  Problem Relation Age of Onset  . CAD Brother   . Kidney cancer Mother   . Breast cancer Cousin     Social History:  reports that she has never smoked. She has never used smokeless tobacco. She reports that she does not drink alcohol or use drugs.  Allergies:  Allergies  Allergen Reactions  . Iodinated Diagnostic Agents Shortness Of Breath    Only with nuclear stress test per pt Only with nuclear stress test per pt   . Penicillins Rash    Rash at injection site. Pt states she can take  Amoxicillin & Keflex   . Escitalopram   . Lithium   . Erythromycin Nausea Only and Nausea And Vomiting    Other reaction(s): Other (See Comments) dehydration     Medications: I have reviewed the patient's current medications.  Results for orders placed or performed during the hospital encounter of 04/03/19 (from the past 48 hour(s))  Glucose, capillary     Status: Abnormal   Collection Time: 04/03/19  7:15 PM  Result Value Ref Range   Glucose-Capillary 108 (H) 70 - 99 mg/dL  Comprehensive metabolic panel     Status: Abnormal   Collection Time: 04/03/19  7:53 PM  Result Value Ref Range   Sodium 142 135 - 145 mmol/L   Potassium 3.6 3.5 - 5.1 mmol/L   Chloride 107 98 - 111 mmol/L   CO2 23 22 - 32 mmol/L   Glucose, Bld 196 (H) 70 - 99 mg/dL   BUN 56 (H) 8 - 23 mg/dL   Creatinine, Ser 2.69 (H) 0.44 - 1.00 mg/dL   Calcium 9.4 8.9 - 10.3 mg/dL   Total Protein 7.0 6.5 - 8.1 g/dL   Albumin 3.8 3.5 - 5.0 g/dL   AST 78 (H) 15 - 41 U/L   ALT 57 (H) 0 - 44 U/L   Alkaline Phosphatase 66 38 - 126 U/L   Total Bilirubin 0.9 0.3 - 1.2 mg/dL   GFR calc non Af Amer 16 (L) >60 mL/min  GFR calc Af Amer 19 (L) >60 mL/min   Anion gap 12 5 - 15    Comment: Performed at [facility], [address] [place], [place] [address]  CBC     Status: None   Collection Time: 04/03/19  7:53 PM  Result Value Ref Range   WBC 8.8 4.0 - 10.5 K/uL   RBC 4.23 3.87 - 5.11 MIL/uL   Hemoglobin 12.2 12.0 - 15.0 g/dL   HCT 36.3 36.0 - 46.0 %   MCV 85.8 80.0 - 100.0 fL   MCH 28.8 26.0 - 34.0 pg   MCHC 33.6 30.0 - 36.0 g/dL   RDW 14.6 11.5 - 15.5 %   Platelets 362 150 - 400 K/uL   nRBC 0.0 0.0 - 0.2 %    Comment: Performed at [facility], [address], [place], [place] [address]  Lactic acid, plasma     Status: None   Collection Time: 04/03/19  7:53 PM  Result Value Ref Range   Lactic Acid, Venous 1.7 0.5 - 1.9 mmol/L    Comment: Performed at [facility], [address] [place], [place] [address]  Troponin I (High Sensitivity)     Status: Abnormal   Collection Time: 04/03/19  7:53 PM  Result Value Ref Range   Troponin I (High Sensitivity) 100 (HH) <18 ng/L    Comment: CRITICAL RESULT CALLED TO, READ BACK BY AND VERIFIED WITH ZACK BRADY 04/03/19 @ 2123  [place] (NOTE) Elevated high sensitivity troponin I (hsTnI) values and significant  changes across serial measurements may suggest ACS but many other  chronic and acute conditions are known to elevate hsTnI results.  Refer to the "Links" section for chest pain algorithms and additional  guidance. Performed at [facility], [address] [place], [place] [address]   Culture, blood (routine x 2)     Status: None (Preliminary result)   Collection Time: 04/03/19  7:53 PM   Specimen: BLOOD  Result Value Ref Range   Specimen Description BLOOD RIGHT ANTECUBITAL    Special Requests      BOTTLES DRAWN AEROBIC AND ANAEROBIC Blood Culture adequate volume   Culture      NO GROWTH < 12 HOURS Performed at [facility], [address], [place], [place] [address]    Report Status PENDING   Urinalysis, Complete w Microscopic     Status: Abnormal   Collection Time: 04/03/19  8:47 PM  Result Value Ref Range   Color, Urine YELLOW (A) YELLOW   APPearance HAZY (A) CLEAR   Specific Gravity, Urine 1.017 1.005 - 1.030   pH 5.0 5.0 - 8.0   Glucose, UA 150 (A) NEGATIVE mg/dL   Hgb urine dipstick SMALL (A) NEGATIVE   Bilirubin Urine NEGATIVE NEGATIVE   Ketones, ur 5 (A) NEGATIVE mg/dL   Protein, ur 100 (A) NEGATIVE mg/dL   Nitrite NEGATIVE NEGATIVE   Leukocytes,Ua NEGATIVE NEGATIVE   RBC / HPF 0-5 0 - 5 RBC/hpf   WBC, UA 0-5 0 - 5 WBC/hpf   Bacteria, UA NONE SEEN NONE SEEN   Squamous Epithelial / LPF NONE SEEN 0 - 5   Mucus PRESENT    Hyaline Casts, UA PRESENT    Granular Casts, UA PRESENT    Amorphous Crystal PRESENT     Comment: Performed at [facility], [address]  [place], [place] [address]  SARS Coronavirus 2 [facility] order, Performed in [facility] hospital lab) Nasopharyngeal Nasopharyngeal Swab  Status: None   Collection Time: 04/03/19  8:47 PM   Specimen: Nasopharyngeal Swab  Result Value Ref Range   SARS Coronavirus 2 NEGATIVE NEGATIVE    Comment: (NOTE) If result is NEGATIVE SARS-CoV-2 target nucleic acids are NOT DETECTED. The SARS-CoV-2 RNA is generally detectable in upper and lower  respiratory specimens during the acute phase of infection. The lowest  concentration of SARS-CoV-2 viral copies this assay can detect is 250  copies / mL. A negative result does not preclude SARS-CoV-2 infection  and should not be used as the sole basis for treatment or other  patient management decisions.  A negative result may occur with  improper specimen collection / handling, submission of specimen other  than nasopharyngeal swab, presence of viral mutation(s) within the  areas targeted by this assay, and inadequate number of viral copies  (<250 copies / mL). A negative result must be combined with clinical  observations, patient history, and epidemiological information. If result is POSITIVE SARS-CoV-2 target nucleic acids are DETECTED. The SARS-CoV-2 RNA is generally detectable in upper and lower  respiratory specimens dur ing the acute phase of infection.  Positive  results are indicative of active infection with SARS-CoV-2.  Clinical  correlation with patient history and other diagnostic information is  necessary to determine patient infection status.  Positive results do  not rule out bacterial infection or co-infection with other viruses. If result is PRESUMPTIVE POSTIVE SARS-CoV-2 nucleic acids MAY BE PRESENT.   A presumptive positive result was obtained on the submitted specimen  and confirmed on repeat testing.  While 2019 novel coronavirus  (SARS-CoV-2) nucleic acids may be present in the submitted sample  additional confirmatory testing may  be necessary for epidemiological  and / or clinical management purposes  to differentiate between  SARS-CoV-2 and other Sarbecovirus currently known to infect humans.  If clinically indicated additional testing with an alternate test  methodology [phone]) is advised. The SARS-CoV-2 RNA is generally  detectable in upper and lower respiratory sp ecimens during the acute  phase of infection. The expected result is Negative. Fact Sheet for Patients:  StrictlyIdeas.no Fact Sheet for Healthcare Providers: BankingDealers.co.za This test is not yet approved or cleared by the [place] FDA and has been authorized for detection and/or diagnosis of SARS-CoV-2 by FDA under an Emergency Use Authorization (EUA).  This EUA will remain in effect (meaning this test can be used) for the duration of the COVID-19 declaration under Section 564(b)(1) of the Act, 21 U.S.C. section 360bbb-3(b)(1), unless the authorization is terminated or revoked sooner. Performed at [facility], [address] [place], [place] [address]   Glucose, capillary     Status: Abnormal   Collection Time: 04/04/19 12:07 AM  Result Value Ref Range   Glucose-Capillary 130 (H) 70 - 99 mg/dL   Comment 1 Document in Chart   Culture, blood (routine x 2)     Status: None (Preliminary result)   Collection Time: 04/04/19  1:09 AM   Specimen: BLOOD  Result Value Ref Range   Specimen Description BLOOD LEFT ASSIST CONTROL    Special Requests      BOTTLES DRAWN AEROBIC ONLY Blood Culture adequate volume   Culture      NO GROWTH < 12 HOURS Performed at [facility], [address], [place], [place] [address]    Report Status PENDING   Troponin I (High Sensitivity)     Status: Abnormal   Collection Time: 04/04/19  1:09 AM  Result Value  Ref Range   Troponin I (High Sensitivity) 109 (HH) <18 ng/L    Comment: CRITICAL VALUE NOTED. VALUE IS CONSISTENT WITH PREVIOUSLY  REPORTED/CALLED VALUE [place] (NOTE) Elevated high sensitivity troponin I (hsTnI) values and significant  changes across serial measurements may suggest ACS but many other  chronic and acute conditions are known to elevate hsTnI results.  Refer to the "Links" section for chest pain algorithms and additional  guidance. Performed at [facility], [address] [place], [place] [id number]   Basic metabolic panel     Status: Abnormal   Collection Time: 04/04/19  1:10 AM  Result Value Ref Range   Sodium 139 135 - 145 mmol/L   Potassium 3.3 (L) 3.5 - 5.1 mmol/L   Chloride 109 98 - 111 mmol/L   CO2 20 (L) 22 - 32 mmol/L   Glucose, Bld 138 (H) 70 - 99 mg/dL   BUN 55 (H) 8 - 23 mg/dL   Creatinine, Ser 2.44 (H) 0.44 - 1.00 mg/dL   Calcium 8.5 (L) 8.9 - 10.3 mg/dL   GFR calc non Af Amer 18 (L) >60 mL/min   GFR calc Af Amer 21 (L) >60 mL/min   Anion gap 10 5 - 15    Comment: Performed at [facility], [address] [place], [place] [address]  CBC     Status: Abnormal   Collection Time: 04/04/19  1:10 AM  Result Value Ref Range   WBC 11.4 (H) 4.0 - 10.5 K/uL   RBC 3.56 (L) 3.87 - 5.11 MIL/uL   Hemoglobin 10.5 (L) 12.0 - 15.0 g/dL   HCT 30.3 (L) 36.0 - 46.0 %   MCV 85.1 80.0 - 100.0 fL   MCH 29.5 26.0 - 34.0 pg   MCHC 34.7 30.0 - 36.0 g/dL   RDW 14.3 11.5 - 15.5 %   Platelets 299 150 - 400 K/uL   nRBC 0.0 0.0 - 0.2 %    Comment: Performed at [facility], [address] [place], [place] [address]  Glucose, capillary     Status: Abnormal   Collection Time: 04/04/19  1:12 AM  Result Value Ref Range   Glucose-Capillary 132 (H) 70 - 99 mg/dL  Glucose, capillary     Status: None   Collection Time: 04/04/19  6:15 AM  Result Value Ref Range   Glucose-Capillary 98 70 - 99 mg/dL  Glucose, capillary     Status: Abnormal   Collection Time: 04/04/19  7:19 AM  Result Value Ref Range   Glucose-Capillary 102 (H) 70 - 99 mg/dL   Comment 1 Notify RN    Comment 2  Document in Chart   Glucose, capillary     Status: Abnormal   Collection Time: 04/04/19 11:23 AM  Result Value Ref Range   Glucose-Capillary 304 (H) 70 - 99 mg/dL   Comment 1 Notify RN    Comment 2 Document in Chart   Glucose, capillary     Status: Abnormal   Collection Time: 04/04/19 12:58 PM  Result Value Ref Range   Glucose-Capillary 348 (H) 70 - 99 mg/dL   Comment 1 Notify RN    Comment 2 Document in Chart     Ct Head Wo Contrast  Result Date: 04/03/2019 CLINICAL DATA:  Status post fall. EXAM: CT HEAD WITHOUT CONTRAST TECHNIQUE: Contiguous axial images were obtained from the base of the skull through the vertex without intravenous contrast. COMPARISON:  Brain MRI 03/29/2019 FINDINGS: Brain: Small left-sided ischemic infarcts in the left  temporal, parietal and occipital lobes, demonstrated by recent MRI are not well seen on this exam. Small area of hypoattenuation in the subcortical white matter of the right frontal lobe corresponds to previously demonstrated small ischemic infarct. No evidence of acute hemorrhage. Moderate brain parenchymal volume loss and deep white matter microangiopathy. Vascular: Intracranial calcific atherosclerotic disease. Skull: Normal. Negative for fracture or focal lesion. Sinuses/Orbits: No acute finding. Other: None. IMPRESSION: 1. 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 2. Small area of hypoattenuation in the subcortical white matter of the right frontal lobe corresponds to previously demonstrated ischemic infarct. 3. No evidence of acute hemorrhage. 4. Moderate brain parenchymal atrophy and chronic microvascular disease. Electronically Signed   By: Fidela Salisbury M.D.   On: 04/03/2019 21:08   Dg Chest Portable 1 View  Result Date: 04/03/2019 CLINICAL DATA:  Fall.  Shortness of breath. EXAM: PORTABLE CHEST 1 VIEW COMPARISON:  03/27/2019 FINDINGS: Mild  cardiomegaly. Lungs clear. No effusions. No acute bony abnormality. IMPRESSION: Mild cardiomegaly.  No active disease. Electronically Signed   By: Rolm Baptise M.D.   On: 04/03/2019 21:05    Review of Systems  Constitutional: Positive for diaphoresis and malaise/fatigue.  HENT: Positive for congestion.   Eyes: Negative.   Respiratory: Positive for shortness of breath.   Cardiovascular: Positive for palpitations.  Gastrointestinal: Negative.   Genitourinary: Negative.   Musculoskeletal: Positive for back pain.  Skin: Negative.   Neurological: Positive for dizziness, tingling, speech change, focal weakness, weakness and headaches.  Endo/Heme/Allergies: Negative.    Blood pressure (!) 155/52, pulse 72, temperature 97.6 F (36.4 C), temperature source Oral, resp. rate 20, height 5\' 1"  (1.549 m), weight 86.9 kg, SpO2 99 %. Physical Exam  Vitals reviewed. Constitutional: She is oriented to person, place, and time. She appears well-developed and well-nourished.  HENT:  Head: Normocephalic and atraumatic.  Eyes: Pupils are equal, round, and reactive to light. Conjunctivae and EOM are normal.  Neck: Normal range of motion. Neck supple.  Cardiovascular: Normal rate, regular rhythm and normal heart sounds.  Respiratory: Effort normal and breath sounds normal.  GI: Soft. Bowel sounds are normal.  Musculoskeletal: Normal range of motion.  Neurological: She is alert and oriented to person, place, and time. She has normal reflexes.  Skin: Skin is warm and dry.  Psychiatric: She has a normal mood and affect.    Assessment/Plan: CVA Hypertension GERD Recent fall Borderline obesity Hypoglycemia Obstructive sleep apnea Diabetes  chronic sinusitis . Plan Agree with admit to telemetry  Continue diabetes management control Agree with Lipitor for lipid management Maintain loratadine for sinus congestion Hypertension controlled with Norvasc amlodipine Aspirin therapy CVA Follow-up with  neurology Echocardiogram will be helpful in post CVA Carotid Dopplers as necessary    Romar Woodrick D Marticia Reifschneider 04/04/2019, 4:48 PM

## 2019-04-04 NOTE — Progress Notes (Signed)
Inpatient Diabetes Program Recommendations  AACE/ADA: New Consensus Statement on Inpatient Glycemic Control (2015)  Target Ranges:  Prepandial:   less than 140 mg/dL      Peak postprandial:   less than 180 mg/dL (1-2 hours)      Critically ill patients:  140 - 180 mg/dL   Results for Holly Conner, Holly Conner (MRN [id number]) as of 04/04/2019 11:01  Ref. Range 04/03/2019 19:15 04/04/2019 00:07 04/04/2019 06:15 04/04/2019 07:19  Glucose-Capillary Latest Ref Range: 70 - 99 mg/dL 108 (H) 130 (H) 98 102 (H)    Admit with: Hypoglycemia (CBG 30s when EMS found her)  History: DM, CVA, CKD  Home DM Meds: Novolog 7-9 units TID with meals                             Tresiba 30 units Daily                             Victoza  Daily  Current Orders: Novolog Sensitive Correction Scale/ SSI (0-9 units) Q6 hours       MD- During hospitalization last week patient was getting Lantus 30 units Daily.  Note patient was admitted with Hypoglycemia.  If CBGs become elevated, recommend we start at least 50% home dose of basal insulin: Lantus 15 units Daily  May need more basal insulin but need to see her CBG trends first  Also, since pt taking PO diet, please change Novolog SSi to tid ac + hs     Just discharged home on 08/04 after hospitalization for fatigue and weakness.  Per EMS her blood glucose was in the 30s when they first found her.  Patient states that she took her insulin this morning, but does not remember if she ate anything or not.      --Will follow patient during hospitalization--  Wyn Quaker RN, MSN, CDE Diabetes Coordinator Inpatient Glycemic Control Team Team Pager: [phone] (8a-5p)

## 2019-04-04 NOTE — Evaluation (Signed)
Physical Therapy Evaluation Patient Details Name: Holly Conner MRN: [id number] DOB: March 24, 1941 Today's Date: 04/04/2019   History of Present Illness  Pt is 78 year old female patient with diabetes mellitus type 2, GERD, CVA, CKD stage III, admitted because of mechanical fall with hypoglycemia (blood sugar of 30 in ED). 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    Clinical Impression  Patient in bed with RN at bedside, alert, oriented to person and situation, president, disoriented to date. Pt stated that she lives with friends who are available PRN in a one story home, and utilizes a RW to ambulate since recent discharge from hospital.   Patient was able to perform bed mobility with supervision, and sit <> stand transfers with and without RW with supervision (mildly impulsive, did not wait for RW). Ambulated ~23ft with RW and CGA. Verbal cues for pt to keep RW closer and upright posture. Stopped once to adjust gown (able to maintain balance without UE support) and then a second time to scratch her R leg with her L leg, unilateral UE support. Decreased gait velocity noted and pt easily distracted. Overall the patient demonstrated deficits (see "PT Problem List") that impede the patient's functional abilities, safety, and mobility and would benefit from skilled PT intervention. Recommendation is HHPT with supervision for mobility/OOB.     Follow Up Recommendations Home health PT;Supervision for mobility/OOB    Equipment Recommendations  None recommended by PT(has RW at home)    Recommendations for Other Services       Precautions / Restrictions Precautions Precautions: Fall Restrictions Weight Bearing Restrictions: No      Mobility  Bed Mobility Overal bed mobility: Modified Independent             General bed mobility comments: Asks for assistance but able to complete mod  I  Transfers Overall transfer level: Needs assistance Equipment used: Rolling walker (2 wheeled) Transfers: Sit to/from Stand Sit to Stand: Supervision         General transfer comment: Impulsively raises twice without RW or PT, returned to sitting twice safely, supervision. Supervision with RW  Ambulation/Gait Ambulation/Gait assistance: Min guard Gait Distance (Feet): 50 Feet Assistive device: Rolling walker (2 wheeled)   Gait velocity: decreased   General Gait Details: Cues for pt to keep RW closer, upright posture. Stopped once to adjust gown (able to maintain balance without UE support) and then a second time to scratch her R leg with her L leg, unilateral UE support.  Stairs            Wheelchair Mobility    Modified Rankin (Stroke Patients Only)       Balance Overall balance assessment: Needs assistance Sitting-balance support: No upper extremity supported Sitting balance-Leahy Scale: Good     Standing balance support: No upper extremity supported Standing balance-Leahy Scale: Fair                               Pertinent Vitals/Pain Pain Assessment: No/denies pain    Home Living Family/patient expects to be discharged to:: Private residence Living Arrangements: Non-relatives/Friends Available Help at Discharge: Friend(s);Available PRN/intermittently Type of Home: House Home Access: Stairs to enter Entrance Stairs-Rails: Left;Right;Can reach both Entrance Stairs-Number of Steps: 2 Home Layout: One level Home Equipment: Walker - 2 wheels;Cane - single point Additional Comments: using RW at home    Prior Function Level  of Independence: Independent with assistive device(s)   Gait / Transfers Assistance Needed: ambulating in home with two wheeld walker  ADL's / Homemaking Assistance Needed: Pt Ind with bathing and dressing        Hand Dominance        Extremity/Trunk Assessment   Upper Extremity Assessment Upper Extremity  Assessment: Generalized weakness    Lower Extremity Assessment Lower Extremity Assessment: Generalized weakness RLE Deficits / Details: RLE strength grossly 4/5 LLE Deficits / Details: LLE strength grossly 4/5       Communication   Communication: No difficulties  Cognition Arousal/Alertness: Awake/alert Behavior During Therapy: WFL for tasks assessed/performed Overall Cognitive Status: No family/caregiver present to determine baseline cognitive functioning                                 General Comments: Patient without family at bedside, often her first response is "no, but" and provides information PT is asking. Not oriented to date, oriented to situation, person      General Comments      Exercises     Assessment/Plan    PT Assessment Patient needs continued PT services  PT Problem List Decreased strength;Decreased balance;Decreased activity tolerance;Decreased knowledge of use of DME;Decreased mobility       PT Treatment Interventions DME instruction;Gait training;Stair training;Functional mobility training;Therapeutic activities;Therapeutic exercise;Balance training;Patient/family education    PT Goals (Current goals can be found in the Care Plan section)  Acute Rehab PT Goals Patient Stated Goal: to go home PT Goal Formulation: With patient Time For Goal Achievement: 04/18/19 Potential to Achieve Goals: Good    Frequency Min 2X/week   Barriers to discharge        Co-evaluation               AM-PAC PT "6 Clicks" Mobility  Outcome Measure Help needed turning from your back to your side while in a flat bed without using bedrails?: None Help needed moving from lying on your back to sitting on the side of a flat bed without using bedrails?: None Help needed moving to and from a bed to a chair (including a wheelchair)?: A Little Help needed standing up from a chair using your arms (e.g., wheelchair or bedside chair)?: None Help needed to  walk in hospital room?: A Little Help needed climbing 3-5 steps with a railing? : A Little 6 Click Score: 21    End of Session Equipment Utilized During Treatment: Gait belt Activity Tolerance: Patient tolerated treatment well Patient left: in chair;with call bell/phone within reach;with chair alarm set Nurse Communication: Mobility status PT Visit Diagnosis: Muscle weakness (generalized) (M62.81);Difficulty in walking, not elsewhere classified (R26.2);History of falling (Z91.81);Other abnormalities of gait and mobility (R26.89)    Time: [id number] PT Time Calculation (min) (ACUTE ONLY): 19 min   Charges:   PT Evaluation $PT Eval Moderate Complexity: 1 Mod PT Treatments $Therapeutic Exercise: 8-22 mins        Lieutenant Diego PT, DPT 12:37 PM,04/04/19 [phone]

## 2019-04-05 DIAGNOSIS — R413 Other amnesia: Secondary | ICD-10-CM

## 2019-04-05 DIAGNOSIS — Z0489 Encounter for examination and observation for other specified reasons: Secondary | ICD-10-CM

## 2019-04-05 DIAGNOSIS — R4189 Other symptoms and signs involving cognitive functions and awareness: Secondary | ICD-10-CM

## 2019-04-05 LAB — GLUCOSE, CAPILLARY
Glucose-Capillary: 287 mg/dL — ABNORMAL HIGH (ref 70–99)
Glucose-Capillary: 195 mg/dL — ABNORMAL HIGH (ref 70–99)
Glucose-Capillary: 359 mg/dL — ABNORMAL HIGH (ref 70–99)
Glucose-Capillary: 186 mg/dL — ABNORMAL HIGH (ref 70–99)
Glucose-Capillary: 242 mg/dL — ABNORMAL HIGH (ref 70–99)

## 2019-04-05 MED ORDER — INSULIN ASPART 100 UNIT/ML ~~LOC~~ SOLN
0.0000 [IU] | Freq: Three times a day (TID) | SUBCUTANEOUS | Status: DC
  Administered 2019-04-05: 2 [IU] via SUBCUTANEOUS
  Administered 2019-04-05: 9 [IU] via SUBCUTANEOUS
  Administered 2019-04-05: 3 [IU] via SUBCUTANEOUS
  Administered 2019-04-06: 5 [IU] via SUBCUTANEOUS
  Administered 2019-04-06: 7 [IU] via SUBCUTANEOUS
  Administered 2019-04-06: 9 [IU] via SUBCUTANEOUS
  Administered 2019-04-06: 2 [IU] via SUBCUTANEOUS
  Administered 2019-04-07: 7 [IU] via SUBCUTANEOUS
  Filled 2019-04-05 (×8): qty 1

## 2019-04-05 NOTE — Progress Notes (Signed)
[facility] [place] NAME: Holly Conner    MR#:  [id number]  DATE OF BIRTH:  10-01-1940  SUBJECTIVE:   Presented to the hospital due to a fall/altered mental status and also noted to be hypoglycemic.  Hypoglycemia has now resolved.  No other acute events overnight.  Mental status improved.  REVIEW OF SYSTEMS:    Review of Systems  Constitutional: Negative for chills and fever.  HENT: Negative for congestion and tinnitus.   Eyes: Negative for blurred vision and double vision.  Respiratory: Negative for cough, shortness of breath and wheezing.   Cardiovascular: Negative for chest pain, orthopnea and PND.  Gastrointestinal: Negative for abdominal pain, diarrhea, nausea and vomiting.  Genitourinary: Negative for dysuria and hematuria.  Neurological: Negative for dizziness, sensory change and focal weakness.  All other systems reviewed and are negative.   Nutrition: Heart Healthy/Carb modified Tolerating Diet: Yes Tolerating PT: Eval noted.   DRUG ALLERGIES:   Allergies  Allergen Reactions  . Iodinated Diagnostic Agents Shortness Of Breath    Only with nuclear stress test per pt Only with nuclear stress test per pt   . Penicillins Rash    Rash at injection site. Pt states she can take Amoxicillin & Keflex   . Escitalopram   . Lithium   . Erythromycin Nausea Only and Nausea And Vomiting    Other reaction(s): Other (See Comments) dehydration     VITALS:  Blood pressure (!) 155/58, pulse 70, temperature 97.6 F (36.4 C), temperature source Oral, resp. rate 16, height 5\' 1"  (1.549 m), weight 85.1 kg, SpO2 98 %.  PHYSICAL EXAMINATION:   Physical Exam  GENERAL:  78 y.o.-year-old patient lying in bed in no acute distress.  EYES: Pupils equal, round, reactive to light and accommodation. No scleral icterus. Extraocular muscles intact.  HEENT: Head atraumatic, normocephalic. Oropharynx and nasopharynx clear.  NECK:  Supple,  no jugular venous distention. No thyroid enlargement, no tenderness.  LUNGS: Normal breath sounds bilaterally, no wheezing, rales, rhonchi. No use of accessory muscles of respiration.  CARDIOVASCULAR: S1, S2 normal. No murmurs, rubs, or gallops.  ABDOMEN: Soft, nontender, nondistended. Bowel sounds present. No organomegaly or mass.  EXTREMITIES: No cyanosis, clubbing or edema b/l.    NEUROLOGIC: Cranial nerves II through XII are intact. No focal Motor or sensory deficits b/l. Globally weak.    PSYCHIATRIC: The patient is alert and oriented x 2.  SKIN: No obvious rash, lesion, or ulcer.    LABORATORY PANEL:   CBC Recent Labs  Lab 04/04/19 0110  WBC 11.4*  HGB 10.5*  HCT 30.3*  PLT 299   ------------------------------------------------------------------------------------------------------------------  Chemistries  Recent Labs  Lab 04/03/19 1953 04/04/19 0110  NA 142 139  K 3.6 3.3*  CL 107 109  CO2 23 20*  GLUCOSE 196* 138*  BUN 56* 55*  CREATININE 2.69* 2.44*  CALCIUM 9.4 8.5*  AST 78*  --   ALT 57*  --   ALKPHOS 66  --   BILITOT 0.9  --    ------------------------------------------------------------------------------------------------------------------  Cardiac Enzymes No results for input(s): TROPONINI in the last 168 hours. ------------------------------------------------------------------------------------------------------------------  RADIOLOGY:  Ct Head Wo Contrast  Result Date: 04/03/2019 CLINICAL DATA:  Status post fall. EXAM: CT HEAD WITHOUT CONTRAST TECHNIQUE: Contiguous axial images were obtained from the base of the skull through the vertex without intravenous contrast. 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 Small area of hypoattenuation in the subcortical white matter of the right frontal lobe  corresponds to previously demonstrated small ischemic infarct. No evidence of acute hemorrhage. Moderate brain parenchymal volume loss and deep white matter microangiopathy. Vascular: Intracranial calcific atherosclerotic disease. Skull: Normal. Negative for fracture or focal lesion. Sinuses/Orbits: No acute finding. Other: None. IMPRESSION: 1. 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 2. Small area of hypoattenuation in the subcortical white matter of the right frontal lobe corresponds to previously demonstrated ischemic infarct. 3. No evidence of acute hemorrhage. 4. Moderate brain parenchymal atrophy and chronic microvascular disease. Electronically Signed   By: Fidela Salisbury M.D.   On: 04/03/2019 21:08   Dg Chest Portable 1 View  Result Date: 04/03/2019 CLINICAL DATA:  Fall.  Shortness of breath. EXAM: PORTABLE CHEST 1 VIEW COMPARISON:  03/27/2019 FINDINGS: Mild cardiomegaly. Lungs clear. No effusions. No acute bony abnormality. IMPRESSION: Mild cardiomegaly.  No active disease. Electronically Signed   By: Rolm Baptise M.D.   On: 04/03/2019 21:05     ASSESSMENT AND PLAN:   78 year old female with past medical history of diabetes, hypertension, urinary incontinence, hyperlipidemia who presented to the hospital due to altered mental status and fall.  1.  Altered mental status/fall-secondary to hypoglycemia.  Patient is on a dextrose drip, blood sugars have improved and currently stable.  Mental status close to baseline.  2.  Hypoglycemia- because of patient's altered mental status.  Blood sugars improved with dextrose infusion. -Continue to hold patient's Tresiba insulin and also Victoza for now.  Continue sliding scale insulin for now.  3.  Hyperlipidemia-continue atorvastatin.  4.  Essential hypertension-continue Norvasc, losartan.  5.  Recurrent falls- seen by physical therapy and  they recommend home health rehab.  Patient's POA although would prefer the patient to go to short-term rehab.  6.  CKD stage III-patient's creatinine is close to baseline and will continue to monitor.  Psychiatric consult obtained to assess patient's competency and patient is competent to make her decisions.  Will arrange home health services prior to discharge.     All the records are reviewed and case discussed with Care Management/Social Worker. Management plans discussed with the patient, family and they are in agreement.  CODE STATUS: Full code  DVT Prophylaxis: Hep SQ  TOTAL TIME TAKING CARE OF THIS PATIENT: 30 minutes.   POSSIBLE D/C IN 1-2 DAYS, DEPENDING ON CLINICAL CONDITION.   Henreitta Leber M.D on 04/05/2019 at 3:31 PM  Between 7am to 6pm - Pager - [phone]  After 6pm go to [email or web address] - Proofreader  [facility]  Office  [phone]  CC: Primary care physician; Dion Body, MD

## 2019-04-05 NOTE — Consult Note (Signed)
[facility] Consult   Reason for Consult:  Capacity  Referring Physician:  Dr Raeanne Barry Patient Identification: Cyrstal Alarie MRN:  [id number] Principal Diagnosis: Unresponsive episode Diagnosis:  Principal Problem:   Unresponsive episode Active Problems:   Hypoglycemia   Memory changes   Type 2 diabetes mellitus (HCC)   HTN (hypertension)   GERD (gastroesophageal reflux disease)   HLD (hyperlipidemia)   CKD (chronic kidney disease), stage IV (HCC)   Elevated troponin   Unresponsive  Total Time spent with patient: 1 hour  Subjective:   Maryetta Csaszar is a 78 y.o. female patient admitted with hypoglycemia.  "I fainted because my blood sugar was low."    HPI:  78 yo female admitted for hypoglycemic issues.  She reports her blood sugar dropped and she fainted.  Ms Saladin has a panic button and used it.  Alert and oriented times three.  States if she feels faint in the future she will use her button.  She reports she lives with her friend, [name], and [name]'s mother, Roselyn Reef.  Medically stable and was scheduled to discharge but family/friends were concerned that she needed a higher level of care.  The patient reports they assist in her medication management and other things as they live with her.  Patient has capacity to make decisions at this time.  If her friends cannot help her, she would benefit from having home health services or the additional help would be beneficial.  CURVES assessment completed for capacity (see treatment plan).  Information from social worker to family/friends regarding placement would be beneficial also.  Past Psychiatric History:   None   Risk to Self:  none Risk to Others:  none Prior Inpatient Therapy:  no psychiatrically admissions Prior Outpatient Therapy:  none  Past Medical History:  Past Medical History:  Diagnosis Date  . Arthritis   . Depression    controlled  . Diabetes mellitus without complication ([place])     managed well;   Marland Kitchen GERD (gastroesophageal reflux disease)   . Hepatitis   . HLD (hyperlipidemia)   . Hypertension    somewhat controlled  . 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 Polymyalgia rheumatica ([place])   . Sleep apnea   . Stroke [facility])     Past Surgical History:  Procedure Laterality Date  . ABDOMINAL HYSTERECTOMY    . BILATERAL SALPINGOOPHORECTOMY    . ESOPHAGEAL DILATION    . OOPHORECTOMY    . REDUCTION MAMMAPLASTY Bilateral 2003  . REPLACEMENT TOTAL KNEE Right   . TEE WITHOUT CARDIOVERSION N/A 03/31/2019   Procedure: TRANSESOPHAGEAL ECHOCARDIOGRAM (TEE);  Surgeon: Corey Skains, MD;  Location: [facility] ORS;  Service: Cardiovascular;  Laterality: N/A;  . TONSILLECTOMY AND ADENOIDECTOMY     Family History:  Family History  Problem Relation Age of Onset  . CAD Brother   . Kidney cancer Mother   . Breast cancer Cousin    Family Psychiatric  History: none Social History:  Social History   Substance and Sexual Activity  Alcohol Use No  . Alcohol/week: 0.0 standard drinks     Social History   Substance and Sexual Activity  Drug Use No    Social History   Socioeconomic History  . Marital status: Single    Spouse name: Not on file  . Number of children: Not on file  . Years of education: Not on file  . Highest education level: Not on file  Occupational History  .  Not on file  Social Needs  . Financial resource strain: Not on file  . Food insecurity    Worry: Not on file    Inability: Not on file  . Transportation needs    Medical: Not on file    Non-medical: Not on file  Tobacco Use  . Smoking status: Never Smoker  . Smokeless tobacco: Never Used  Substance and Sexual Activity  . Alcohol use: No    Alcohol/week: 0.0 standard drinks  . Drug use: No  . Sexual activity: Yes    Birth control/protection: Surgical  Lifestyle  . Physical activity    Days per week: Not on file    Minutes per session: Not on file  . Stress: Not on file   Relationships  . Social Herbalist on phone: Not on file    Gets together: Not on file    Attends religious service: Not on file    Active member of club or organization: Not on file    Attends meetings of clubs or organizations: Not on file    Relationship status: Not on file  Other Topics Concern  . Not on file  Social History Narrative  . Not on file   Additional Social History:    Allergies:   Allergies  Allergen Reactions  . Iodinated Diagnostic Agents Shortness Of Breath    Only with nuclear stress test per pt Only with nuclear stress test per pt   . Penicillins Rash    Rash at injection site. Pt states she can take Amoxicillin & Keflex   . Escitalopram   . Lithium   . Erythromycin Nausea Only and Nausea And Vomiting    Other reaction(s): Other (See Comments) dehydration     Labs:  Results for orders placed or performed during the hospital encounter of 04/03/19 (from the past 48 hour(s))  Glucose, capillary     Status: Abnormal   Collection Time: 04/03/19  7:15 PM  Result Value Ref Range   Glucose-Capillary 108 (H) 70 - 99 mg/dL  Comprehensive metabolic panel     Status: Abnormal   Collection Time: 04/03/19  7:53 PM  Result Value Ref Range   Sodium 142 135 - 145 mmol/L   Potassium 3.6 3.5 - 5.1 mmol/L   Chloride 107 98 - 111 mmol/L   CO2 23 22 - 32 mmol/L   Glucose, Bld 196 (H) 70 - 99 mg/dL   BUN 56 (H) 8 - 23 mg/dL   Creatinine, Ser 2.69 (H) 0.44 - 1.00 mg/dL   Calcium 9.4 8.9 - 10.3 mg/dL   Total Protein 7.0 6.5 - 8.1 g/dL   Albumin 3.8 3.5 - 5.0 g/dL   AST 78 (H) 15 - 41 U/L   ALT 57 (H) 0 - 44 U/L   Alkaline Phosphatase 66 38 - 126 U/L   Total Bilirubin 0.9 0.3 - 1.2 mg/dL   GFR calc non Af Amer 16 (L) >60 mL/min   GFR calc Af Amer 19 (L) >60 mL/min   Anion gap 12 5 - 15    Comment: Performed at [facility], [address] [place],  [address]  CBC     Status: None   Collection Time: 04/03/19  7:53 PM  Result  Value Ref Range   WBC 8.8 4.0 - 10.5 K/uL   RBC 4.23 3.87 - 5.11 MIL/uL   Hemoglobin 12.2 12.0 - 15.0 g/dL   HCT 36.3 36.0 - 46.0 %   MCV 85.8  80.0 - 100.0 fL   MCH 28.8 26.0 - 34.0 pg   MCHC 33.6 30.0 - 36.0 g/dL   RDW 14.6 11.5 - 15.5 %   Platelets 362 150 - 400 K/uL   nRBC 0.0 0.0 - 0.2 %    Comment: Performed at [facility], [address] [place], [place] [address]  Lactic acid, plasma     Status: None   Collection Time: 04/03/19  7:53 PM  Result Value Ref Range   Lactic Acid, Venous 1.7 0.5 - 1.9 mmol/L    Comment: Performed at [facility], [address] [place], [place] [address]  Troponin I (High Sensitivity)     Status: Abnormal   Collection Time: 04/03/19  7:53 PM  Result Value Ref Range   Troponin I (High Sensitivity) 100 (HH) <18 ng/L    Comment: CRITICAL RESULT CALLED TO, READ BACK BY AND VERIFIED WITH ZACK BRADY 04/03/19 @ 2123  [place] (NOTE) Elevated high sensitivity troponin I (hsTnI) values and significant  changes across serial measurements may suggest ACS but many other  chronic and acute conditions are known to elevate hsTnI results.  Refer to the "Links" section for chest pain algorithms and additional  guidance. Performed at [facility], [address] [place], [place] [address]   Culture, blood (routine x 2)     Status: None (Preliminary result)   Collection Time: 04/03/19  7:53 PM   Specimen: BLOOD  Result Value Ref Range   Specimen Description BLOOD RIGHT ANTECUBITAL    Special Requests      BOTTLES DRAWN AEROBIC AND ANAEROBIC Blood Culture adequate volume   Culture      NO GROWTH 2 DAYS Performed at [facility], [address] [place], [place] [address]    Report Status PENDING   Urinalysis, Complete w Microscopic     Status: Abnormal   Collection Time: 04/03/19  8:47 PM  Result Value Ref Range   Color, Urine YELLOW (A) YELLOW   APPearance HAZY (A) CLEAR   Specific Gravity, Urine 1.017 1.005 - 1.030    pH 5.0 5.0 - 8.0   Glucose, UA 150 (A) NEGATIVE mg/dL   Hgb urine dipstick SMALL (A) NEGATIVE   Bilirubin Urine NEGATIVE NEGATIVE   Ketones, ur 5 (A) NEGATIVE mg/dL   Protein, ur 100 (A) NEGATIVE mg/dL   Nitrite NEGATIVE NEGATIVE   Leukocytes,Ua NEGATIVE NEGATIVE   RBC / HPF 0-5 0 - 5 RBC/hpf   WBC, UA 0-5 0 - 5 WBC/hpf   Bacteria, UA NONE SEEN NONE SEEN   Squamous Epithelial / LPF NONE SEEN 0 - 5   Mucus PRESENT    Hyaline Casts, UA PRESENT    Granular Casts, UA PRESENT    Amorphous Crystal PRESENT     Comment: Performed at [facility], [address], [place], [place] [address]  SARS Coronavirus 2 [facility] order, Performed in [facility] hospital lab) Nasopharyngeal Nasopharyngeal Swab     Status: None   Collection Time: 04/03/19  8:47 PM   Specimen: Nasopharyngeal Swab  Result Value Ref Range   SARS Coronavirus 2 NEGATIVE NEGATIVE    Comment: (NOTE) If result is NEGATIVE SARS-CoV-2 target nucleic acids are NOT DETECTED. The SARS-CoV-2 RNA is generally detectable in upper and lower  respiratory specimens during the acute phase of infection. The lowest  concentration of SARS-CoV-2 viral copies this assay can detect is 250  copies / mL. A negative result does not preclude SARS-CoV-2 infection  and should not be used as the sole basis for treatment or other  patient management decisions.  A negative result may occur with  improper specimen collection / handling, submission of specimen other  than nasopharyngeal swab, presence of viral mutation(s) within the  areas targeted by this assay, and inadequate number of viral copies  (<250 copies / mL). A negative result must be combined with clinical  observations, patient history, and epidemiological information. If result is POSITIVE SARS-CoV-2 target nucleic acids are DETECTED. The SARS-CoV-2 RNA is generally detectable in upper and lower  respiratory specimens dur ing the acute phase of infection.  Positive   results are indicative of active infection with SARS-CoV-2.  Clinical  correlation with patient history and other diagnostic information is  necessary to determine patient infection status.  Positive results do  not rule out bacterial infection or co-infection with other viruses. If result is PRESUMPTIVE POSTIVE SARS-CoV-2 nucleic acids MAY BE PRESENT.   A presumptive positive result was obtained on the submitted specimen  and confirmed on repeat testing.  While 2019 novel coronavirus  (SARS-CoV-2) nucleic acids may be present in the submitted sample  additional confirmatory testing may be necessary for epidemiological  and / or clinical management purposes  to differentiate between  SARS-CoV-2 and other Sarbecovirus currently known to infect humans.  If clinically indicated additional testing with an alternate test  methodology [phone]) is advised. The SARS-CoV-2 RNA is generally  detectable in upper and lower respiratory sp ecimens during the acute  phase of infection. The expected result is Negative. Fact Sheet for Patients:  StrictlyIdeas.no Fact Sheet for Healthcare Providers: BankingDealers.co.za This test is not yet approved or cleared by the [place] FDA and has been authorized for detection and/or diagnosis of SARS-CoV-2 by FDA under an Emergency Use Authorization (EUA).  This EUA will remain in effect (meaning this test can be used) for the duration of the COVID-19 declaration under Section 564(b)(1) of the Act, 21 U.S.C. section 360bbb-3(b)(1), unless the authorization is terminated or revoked sooner. Performed at [facility], [address] [place], [place] [address]   Glucose, capillary     Status: Abnormal   Collection Time: 04/04/19 12:07 AM  Result Value Ref Range   Glucose-Capillary 130 (H) 70 - 99 mg/dL   Comment 1 Document in Chart   Culture, blood (routine x 2)     Status: None (Preliminary  result)   Collection Time: 04/04/19  1:09 AM   Specimen: BLOOD  Result Value Ref Range   Specimen Description BLOOD LEFT ASSIST CONTROL    Special Requests      BOTTLES DRAWN AEROBIC ONLY Blood Culture adequate volume   Culture      NO GROWTH 1 DAY Performed at [facility], [address] [place], [place] [address]    Report Status PENDING   Troponin I (High Sensitivity)     Status: Abnormal   Collection Time: 04/04/19  1:09 AM  Result Value Ref Range   Troponin I (High Sensitivity) 109 (HH) <18 ng/L    Comment: CRITICAL VALUE NOTED. VALUE IS CONSISTENT WITH PREVIOUSLY REPORTED/CALLED VALUE [place] (NOTE) Elevated high sensitivity troponin I (hsTnI) values and significant  changes across serial measurements may suggest ACS but many other  chronic and acute conditions are known to elevate hsTnI results.  Refer to the "Links" section for chest pain algorithms and additional  guidance. Performed at [facility], [address] [place],  [id number]   Basic metabolic  panel     Status: Abnormal   Collection Time: 04/04/19  1:10 AM  Result Value Ref Range   Sodium 139 135 - 145 mmol/L   Potassium 3.3 (L) 3.5 - 5.1 mmol/L   Chloride 109 98 - 111 mmol/L   CO2 20 (L) 22 - 32 mmol/L   Glucose, Bld 138 (H) 70 - 99 mg/dL   BUN 55 (H) 8 - 23 mg/dL   Creatinine, Ser 2.44 (H) 0.44 - 1.00 mg/dL   Calcium 8.5 (L) 8.9 - 10.3 mg/dL   GFR calc non Af Amer 18 (L) >60 mL/min   GFR calc Af Amer 21 (L) >60 mL/min   Anion gap 10 5 - 15    Comment: Performed at [facility], [address] [place], [place] [address]  CBC     Status: Abnormal   Collection Time: 04/04/19  1:10 AM  Result Value Ref Range   WBC 11.4 (H) 4.0 - 10.5 K/uL   RBC 3.56 (L) 3.87 - 5.11 MIL/uL   Hemoglobin 10.5 (L) 12.0 - 15.0 g/dL   HCT 30.3 (L) 36.0 - 46.0 %   MCV 85.1 80.0 - 100.0 fL   MCH 29.5 26.0 - 34.0 pg   MCHC 34.7 30.0 - 36.0 g/dL   RDW 14.3 11.5 - 15.5 %   Platelets 299 150  - 400 K/uL   nRBC 0.0 0.0 - 0.2 %    Comment: Performed at [facility], [address] [place], [place] [address]  Glucose, capillary     Status: Abnormal   Collection Time: 04/04/19  1:12 AM  Result Value Ref Range   Glucose-Capillary 132 (H) 70 - 99 mg/dL  Glucose, capillary     Status: None   Collection Time: 04/04/19  6:15 AM  Result Value Ref Range   Glucose-Capillary 98 70 - 99 mg/dL  Glucose, capillary     Status: Abnormal   Collection Time: 04/04/19  7:19 AM  Result Value Ref Range   Glucose-Capillary 102 (H) 70 - 99 mg/dL   Comment 1 Notify RN    Comment 2 Document in Chart   Glucose, capillary     Status: Abnormal   Collection Time: 04/04/19 11:23 AM  Result Value Ref Range   Glucose-Capillary 304 (H) 70 - 99 mg/dL   Comment 1 Notify RN    Comment 2 Document in Chart   Glucose, capillary     Status: Abnormal   Collection Time: 04/04/19 12:58 PM  Result Value Ref Range   Glucose-Capillary 348 (H) 70 - 99 mg/dL   Comment 1 Notify RN    Comment 2 Document in Chart   Glucose, capillary     Status: Abnormal   Collection Time: 04/04/19  5:02 PM  Result Value Ref Range   Glucose-Capillary 246 (H) 70 - 99 mg/dL   Comment 1 Notify RN    Comment 2 Document in Chart   Glucose, capillary     Status: Abnormal   Collection Time: 04/05/19  1:14 AM  Result Value Ref Range   Glucose-Capillary 287 (H) 70 - 99 mg/dL  Glucose, capillary     Status: Abnormal   Collection Time: 04/05/19  7:36 AM  Result Value Ref Range   Glucose-Capillary 195 (H) 70 - 99 mg/dL   Comment 1 Notify RN    Comment 2 Document in Chart   Glucose, capillary     Status: Abnormal   Collection Time: 04/05/19 11:33 AM  Result Value Ref Range  Glucose-Capillary 359 (H) 70 - 99 mg/dL   Comment 1 Notify RN    Comment 2 Document in Chart     Current Facility-Administered Medications  Medication Dose Route Frequency Provider Last Rate Last Dose  . acetaminophen (TYLENOL) tablet 650 mg  650 mg  Oral Q6H PRN Lance Coon, MD       Or  . acetaminophen (TYLENOL) suppository 650 mg  650 mg Rectal Q6H PRN Lance Coon, MD      . amLODipine (NORVASC) tablet 10 mg  10 mg Oral Daily Lance Coon, MD   10 mg at 04/05/19 [id number]  . aspirin EC tablet 81 mg  81 mg Oral Daily Lance Coon, MD   81 mg at 04/05/19 [id number]  . atorvastatin (LIPITOR) tablet 80 mg  80 mg Oral Daily Lance Coon, MD   80 mg at 04/05/19 0924  . heparin injection 5,000 Units  5,000 Units Subcutaneous Driscilla Moats, MD   5,000 Units at 04/05/19 0602  . insulin aspart (novoLOG) injection 0-9 Units  0-9 Units Subcutaneous TID AC & HS Henreitta Leber, MD   9 Units at 04/05/19 1216  . losartan (COZAAR) tablet 50 mg  50 mg Oral Daily Lance Coon, MD   50 mg at 04/05/19 [id number]  . ondansetron (ZOFRAN) tablet 4 mg  4 mg Oral Q6H PRN Lance Coon, MD       Or  . ondansetron [facility]) injection 4 mg  4 mg Intravenous Q6H PRN Lance Coon, MD      . oxyCODONE (Oxy IR/ROXICODONE) immediate release tablet 5 mg  5 mg Oral Q4H PRN Lance Coon, MD      . polyethylene glycol (MIRALAX / GLYCOLAX) packet 17 g  17 g Oral Daily Epifanio Lesches, MD   17 g at 04/05/19 [id number]    Musculoskeletal: Strength & Muscle Tone: within normal limits Gait & Station: normal Patient leans: N/A  Psychiatric Specialty Exam: Physical Exam  Nursing note and vitals reviewed. Constitutional: She is oriented to person, place, and time. She appears well-developed and well-nourished.  HENT:  Head: Normocephalic.  Neck: Normal range of motion.  Respiratory: Effort normal.  Musculoskeletal: Normal range of motion.  Neurological: She is alert and oriented to person, place, and time.  Psychiatric: Her speech is normal and behavior is normal. Judgment and thought content normal. Her affect is blunt. Cognition and memory are normal.    Review of Systems  Neurological: Positive for weakness.  Psychiatric/Behavioral: Positive for memory loss.  All other  systems reviewed and are negative.   Blood pressure (!) 155/58, pulse 70, temperature 97.6 F (36.4 C), temperature source Oral, resp. rate 16, height 5\' 1"  (1.549 m), weight 85.1 kg, SpO2 98 %.Body mass index is 35.45 kg/m.  General Appearance: Casual  Eye Contact:  Good  Speech:  Normal Rate  Volume:  Normal  Mood:  Euthymic  Affect:  Blunt  Thought Process:  Coherent and Descriptions of Associations: Intact  Orientation:  Full (Time, Place, and Person)  Thought Content:  WDL and Logical  Suicidal Thoughts:  No  Homicidal Thoughts:  No  Memory:  Immediate;   Good Recent;   Fair Remote;   Fair  Judgement:  Fair  Insight:  Fair  Psychomotor Activity:  Decreased  Concentration:  Concentration: Good and Attention Span: Good  Recall:  [place] of Knowledge:  Good  Language:  Good  Akathisia:  No  Handed:  Right  AIMS (if indicated):     Assets:  Housing Leisure Time Resilience Social Support  ADL's:  Intact  Cognition:  Impaired,  Mild  Sleep:       Treatment Plan Summary: Memory deficits Completed CURVES for capacity: Choose and communicate Can the patient make a choice and communicate that choice?  Yes Understand Can the patient understand benefits, risks and alternatives?  Yes What problem are you having right now that brought you to the hospital? She communicated her issue was her hypoglycemia and she utilized her panic button to call for help. What are the treatments for your problem? Glucose monitoring with proper diet, assistance by her housemates What are the risks of treatment and the odds you might have a side effect or bad outcome?  Understands hypo and hyperglycemia What will happen if nothing is done?  For her diabetes---become hyperglycemic or hypoglycemic and die Reason Can the patient make a logical, rational choice? Yes What factors are most important in your decision? Want to feel good Value Is the choice the patient makes consistent with their  values? Yes Emergency Is there an impending, emergent risk to the patient?  No Surrogate Is there a close relative (spouse, adult child, parent, sibling)?  Yes, [name]  Patient understands her situation, can make decisions, and understands her housemates assist her management of medications and care.  Social Work Consult: Would benefit from home health services Information from Education officer, museum to family/friends regarding placement would be beneficial also  Disposition: No evidence of imminent risk to self or others at present.   Supportive therapy provided about ongoing stressors.  Waylan Boga, NP 04/05/2019 12:41 PM

## 2019-04-05 NOTE — Plan of Care (Signed)

## 2019-04-05 NOTE — Plan of Care (Signed)
  Problem: Education: Goal: Knowledge of General Education information will improve Description: Including pain rating scale, medication(s)/side effects and non-pharmacologic comfort measures Outcome: Progressing   Problem: Clinical Measurements: Goal: Ability to maintain clinical measurements within normal limits will improve Outcome: Progressing Goal: Diagnostic test results will improve Outcome: Progressing   Problem: Activity: Goal: Risk for activity intolerance will decrease Outcome: Progressing   Problem: Skin Integrity: Goal: Risk for impaired skin integrity will decrease Outcome: Progressing

## 2019-04-06 LAB — CBC
HCT: 29.5 % — ABNORMAL LOW (ref 36.0–46.0)
Hemoglobin: 10 g/dL — ABNORMAL LOW (ref 12.0–15.0)
MCH: 29.9 pg (ref 26.0–34.0)
MCHC: 33.9 g/dL (ref 30.0–36.0)
MCV: 88.1 fL (ref 80.0–100.0)
Platelets: 281 10*3/uL (ref 150–400)
RBC: 3.35 MIL/uL — ABNORMAL LOW (ref 3.87–5.11)
RDW: 14.8 % (ref 11.5–15.5)
WBC: 7 10*3/uL (ref 4.0–10.5)
nRBC: 0 % (ref 0.0–0.2)

## 2019-04-06 LAB — BASIC METABOLIC PANEL
Anion gap: 8 (ref 5–15)
BUN: 53 mg/dL — ABNORMAL HIGH (ref 8–23)
CO2: 22 mmol/L (ref 22–32)
Calcium: 8.6 mg/dL — ABNORMAL LOW (ref 8.9–10.3)
Chloride: 110 mmol/L (ref 98–111)
Creatinine, Ser: 2.48 mg/dL — ABNORMAL HIGH (ref 0.44–1.00)
GFR calc Af Amer: 21 mL/min — ABNORMAL LOW (ref >60)
GFR calc non Af Amer: 18 mL/min — ABNORMAL LOW (ref >60)
Glucose, Bld: 291 mg/dL — ABNORMAL HIGH (ref 70–99)
Potassium: 4.1 mmol/L (ref 3.5–5.1)
Sodium: 140 mmol/L (ref 135–145)

## 2019-04-06 LAB — GLUCOSE, CAPILLARY
Glucose-Capillary: 167 mg/dL — ABNORMAL HIGH (ref 70–99)
Glucose-Capillary: 277 mg/dL — ABNORMAL HIGH (ref 70–99)
Glucose-Capillary: 310 mg/dL — ABNORMAL HIGH (ref 70–99)
Glucose-Capillary: 315 mg/dL — ABNORMAL HIGH (ref 70–99)
Glucose-Capillary: 392 mg/dL — ABNORMAL HIGH (ref 70–99)

## 2019-04-06 MED ORDER — HALOPERIDOL LACTATE 5 MG/ML IJ SOLN: 2.5000 mg | Freq: Four times a day (QID) | INTRAMUSCULAR | Status: DC | PRN

## 2019-04-06 MED ORDER — HALOPERIDOL LACTATE 5 MG/ML IJ SOLN
2.0000 mg | Freq: Once | INTRAMUSCULAR | Status: AC
  Administered 2019-04-06: 2 mg via INTRAMUSCULAR
  Filled 2019-04-06: qty 1

## 2019-04-06 MED ORDER — QUETIAPINE FUMARATE 25 MG PO TABS: 25.0000 mg | ORAL_TABLET | Freq: Every day | ORAL | Status: DC

## 2019-04-06 MED ORDER — HYDRALAZINE HCL 20 MG/ML IJ SOLN
10.0000 mg | INTRAMUSCULAR | Status: DC | PRN
  Administered 2019-04-06: 10 mg via INTRAVENOUS
  Filled 2019-04-06: qty 1

## 2019-04-06 MED ORDER — LORAZEPAM 2 MG/ML IJ SOLN
1.0000 mg | Freq: Once | INTRAMUSCULAR | Status: AC
  Administered 2019-04-06: 1 mg via INTRAMUSCULAR
  Filled 2019-04-06: qty 1

## 2019-04-06 MED ORDER — QUETIAPINE FUMARATE 25 MG PO TABS
25.0000 mg | ORAL_TABLET | Freq: Every day | ORAL | Status: DC
  Administered 2019-04-06: 25 mg via ORAL
  Filled 2019-04-06: qty 1

## 2019-04-06 NOTE — Consult Note (Addendum)
[facility] Consult   Reason for Consult:  Capacity  Referring Physician:  Dr Raeanne Barry Patient Identification: Holly Conner MRN:  [id number] Principal Diagnosis: Unresponsive episode Diagnosis:  Principal Problem:   Unresponsive episode Active Problems:   Hypoglycemia   Memory changes   Type 2 diabetes mellitus (HCC)   HTN (hypertension)   GERD (gastroesophageal reflux disease)   HLD (hyperlipidemia)   CKD (chronic kidney disease), stage IV (HCC)   Elevated troponin   Unresponsive  Total Time spent with patient: 35 minutes  Subjective:   Anette Dike is a 78 y.o. female patient admitted with hypoglycemia.  "I'm ok."    04/06/19:   Patient needs assistance with setting up her food tray and adjusting her bed to eat.  Calm, eating independently after set-up.  Denies pain or issues.  Agitated last night and restless, given Haldol and Ativan, asleep at noon when first assessed the patient.  RN reports she has been "up and down" to her bathroom and in her room today, restless.  MD called this morning for agitation medications as she appears to be having sundowning.    HPI:  78 yo female admitted for hypoglycemic issues.  She reports her blood sugar dropped and she fainted.  Ms Lintz has a panic button and used it.  Alert and oriented times three.  States if she feels faint in the future she will use her button.  She reports she lives with her friend, [name], and [name]'s mother, Roselyn Reef.  Medically stable and was scheduled to discharge but family/friends were concerned that she needed a higher level of care.  The patient reports they assist in her medication management and other things as they live with her.  Patient has capacity to make decisions at this time.  If her friends cannot help her, she would benefit from having home health services or the additional help would be beneficial.  [facility] assessment completed for capacity (see treatment plan).  Information  from social worker to family/friends regarding placement would be beneficial also.  Past Psychiatric History:   None   Risk to Self:  none Risk to Others:  none Prior Inpatient Therapy:  no psychiatrically admissions Prior Outpatient Therapy:  none  Past Medical History:  Past Medical History:  Diagnosis Date  . Arthritis   . Depression    controlled  . Diabetes mellitus without complication ([place])    managed well;   Marland Kitchen GERD (gastroesophageal reflux disease)   . Hepatitis   . HLD (hyperlipidemia)   . Hypertension    somewhat controlled  . 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 Polymyalgia rheumatica ([place])   . Sleep apnea   . Stroke [facility])     Past Surgical History:  Procedure Laterality Date  . ABDOMINAL HYSTERECTOMY    . BILATERAL SALPINGOOPHORECTOMY    . ESOPHAGEAL DILATION    . OOPHORECTOMY    . REDUCTION MAMMAPLASTY Bilateral 2003  . REPLACEMENT TOTAL KNEE Right   . TEE WITHOUT CARDIOVERSION N/A 03/31/2019   Procedure: TRANSESOPHAGEAL ECHOCARDIOGRAM (TEE);  Surgeon: Corey Skains, MD;  Location: [facility] ORS;  Service: Cardiovascular;  Laterality: N/A;  . TONSILLECTOMY AND ADENOIDECTOMY     Family History:  Family History  Problem Relation Age of Onset  . CAD Brother   . Kidney cancer Mother   . Breast cancer Cousin    Family Psychiatric  History: none Social History:  Social History   Substance and Sexual Activity  Alcohol Use No  . Alcohol/week: 0.0 standard drinks     Social History   Substance and Sexual Activity  Drug Use No    Social History   Socioeconomic History  . Marital status: Single    Spouse name: Not on file  . Number of children: Not on file  . Years of education: Not on file  . Highest education level: Not on file  Occupational History  . Not on file  Social Needs  . Financial resource strain: Not on file  . Food insecurity    Worry: Not on file    Inability: Not on file  . Transportation needs    Medical:  Not on file    Non-medical: Not on file  Tobacco Use  . Smoking status: Never Smoker  . Smokeless tobacco: Never Used  Substance and Sexual Activity  . Alcohol use: No    Alcohol/week: 0.0 standard drinks  . Drug use: No  . Sexual activity: Yes    Birth control/protection: Surgical  Lifestyle  . Physical activity    Days per week: Not on file    Minutes per session: Not on file  . Stress: Not on file  Relationships  . Social Herbalist on phone: Not on file    Gets together: Not on file    Attends religious service: Not on file    Active member of club or organization: Not on file    Attends meetings of clubs or organizations: Not on file    Relationship status: Not on file  Other Topics Concern  . Not on file  Social History Narrative  . Not on file   Additional Social History:    Allergies:   Allergies  Allergen Reactions  . Iodinated Diagnostic Agents Shortness Of Breath    Only with nuclear stress test per pt Only with nuclear stress test per pt   . Penicillins Rash    Rash at injection site. Pt states she can take Amoxicillin & Keflex   . Escitalopram   . Lithium   . Erythromycin Nausea Only and Nausea And Vomiting    Other reaction(s): Other (See Comments) dehydration     Labs:  Results for orders placed or performed during the hospital encounter of 04/03/19 (from the past 48 hour(s))  Glucose, capillary     Status: Abnormal   Collection Time: 04/05/19  1:14 AM  Result Value Ref Range   Glucose-Capillary 287 (H) 70 - 99 mg/dL  Glucose, capillary     Status: Abnormal   Collection Time: 04/05/19  7:36 AM  Result Value Ref Range   Glucose-Capillary 195 (H) 70 - 99 mg/dL   Comment 1 Notify RN    Comment 2 Document in Chart   Glucose, capillary     Status: Abnormal   Collection Time: 04/05/19 11:33 AM  Result Value Ref Range   Glucose-Capillary 359 (H) 70 - 99 mg/dL   Comment 1 Notify RN    Comment 2 Document in Chart   Glucose, capillary      Status: Abnormal   Collection Time: 04/05/19  5:02 PM  Result Value Ref Range   Glucose-Capillary 186 (H) 70 - 99 mg/dL   Comment 1 Notify RN    Comment 2 Document in Chart   Glucose, capillary     Status: Abnormal   Collection Time: 04/05/19  9:21 PM  Result Value Ref Range   Glucose-Capillary 242 (H) 70 - 99 mg/dL  Glucose, capillary  Status: Abnormal   Collection Time: 04/06/19 12:55 AM  Result Value Ref Range   Glucose-Capillary 310 (H) 70 - 99 mg/dL   Comment 1 Notify RN   CBC     Status: Abnormal   Collection Time: 04/06/19  5:41 AM  Result Value Ref Range   WBC 7.0 4.0 - 10.5 K/uL   RBC 3.35 (L) 3.87 - 5.11 MIL/uL   Hemoglobin 10.0 (L) 12.0 - 15.0 g/dL   HCT 29.5 (L) 36.0 - 46.0 %   MCV 88.1 80.0 - 100.0 fL   MCH 29.9 26.0 - 34.0 pg   MCHC 33.9 30.0 - 36.0 g/dL   RDW 14.8 11.5 - 15.5 %   Platelets 281 150 - 400 K/uL   nRBC 0.0 0.0 - 0.2 %    Comment: Performed at [facility], [address] [place], [place] [id number]  Basic metabolic panel     Status: Abnormal   Collection Time: 04/06/19  5:41 AM  Result Value Ref Range   Sodium 140 135 - 145 mmol/L   Potassium 4.1 3.5 - 5.1 mmol/L   Chloride 110 98 - 111 mmol/L   CO2 22 22 - 32 mmol/L   Glucose, Bld 291 (H) 70 - 99 mg/dL   BUN 53 (H) 8 - 23 mg/dL   Creatinine, Ser 2.48 (H) 0.44 - 1.00 mg/dL   Calcium 8.6 (L) 8.9 - 10.3 mg/dL   GFR calc non Af Amer 18 (L) >60 mL/min   GFR calc Af Amer 21 (L) >60 mL/min   Anion gap 8 5 - 15    Comment: Performed at [facility], [address] [place], [place] [address]  Glucose, capillary     Status: Abnormal   Collection Time: 04/06/19  8:12 AM  Result Value Ref Range   Glucose-Capillary 315 (H) 70 - 99 mg/dL  Glucose, capillary     Status: Abnormal   Collection Time: 04/06/19  1:09 PM  Result Value Ref Range   Glucose-Capillary 167 (H) 70 - 99 mg/dL    Current Facility-Administered Medications  Medication Dose Route Frequency Provider Last  Rate Last Dose  . acetaminophen (TYLENOL) tablet 650 mg  650 mg Oral Q6H PRN Lance Coon, MD       Or  . acetaminophen (TYLENOL) suppository 650 mg  650 mg Rectal Q6H PRN Lance Coon, MD      . amLODipine (NORVASC) tablet 10 mg  10 mg Oral Daily Lance Coon, MD   10 mg at 04/05/19 [id number]  . aspirin EC tablet 81 mg  81 mg Oral Daily Lance Coon, MD   81 mg at 04/05/19 [id number]  . atorvastatin (LIPITOR) tablet 80 mg  80 mg Oral Daily Lance Coon, MD   80 mg at 04/05/19 [id number]  . haloperidol lactate (HALDOL) injection 2.5 mg  2.5 mg Intravenous Q6H PRN Henreitta Leber, MD      . heparin injection 5,000 Units  5,000 Units Subcutaneous Camelia Phenes Lance Coon, MD   5,000 Units at 04/06/19 1408  . hydrALAZINE (APRESOLINE) injection 10 mg  10 mg Intravenous Q4H PRN Lance Coon, MD   10 mg at 04/06/19 0017  . insulin aspart (novoLOG) injection 0-9 Units  0-9 Units Subcutaneous TID AC & HS Henreitta Leber, MD   2 Units at 04/06/19 1408  . losartan (COZAAR) tablet 50 mg  50 mg Oral Daily Lance Coon, MD   50 mg at 04/05/19 [id number]  . ondansetron (ZOFRAN) tablet 4 mg  4  mg Oral Q6H PRN Lance Coon, MD       Or  . ondansetron [facility]) injection 4 mg  4 mg Intravenous Q6H PRN Lance Coon, MD      . oxyCODONE (Oxy IR/ROXICODONE) immediate release tablet 5 mg  5 mg Oral Q4H PRN Lance Coon, MD      . polyethylene glycol (MIRALAX / GLYCOLAX) packet 17 g  17 g Oral Daily Epifanio Lesches, MD   17 g at 04/05/19 0925  . QUEtiapine (SEROQUEL) tablet 25 mg  25 mg Oral QHS Henreitta Leber, MD        Musculoskeletal: Strength & Muscle Tone: within normal limits Gait & Station: normal Patient leans: N/A  Psychiatric Specialty Exam: Physical Exam  Nursing note and vitals reviewed. Constitutional: She is oriented to person, place, and time. She appears well-developed and well-nourished.  HENT:  Head: Normocephalic.  Neck: Normal range of motion.  Respiratory: Effort normal.  Musculoskeletal: Normal  range of motion.  Neurological: She is alert and oriented to person, place, and time.  Psychiatric: Her speech is normal and behavior is normal. Judgment and thought content normal. Her affect is blunt. Cognition and memory are impaired.    Review of Systems  Neurological: Positive for weakness.  Psychiatric/Behavioral: Positive for memory loss.  All other systems reviewed and are negative.   Blood pressure (!) 146/58, pulse 70, temperature 97.9 F (36.6 C), temperature source Oral, resp. rate 18, height 5\' 1"  (1.549 m), weight 85.1 kg, SpO2 99 %.Body mass index is 35.45 kg/m.  General Appearance: Casual  Eye Contact:  Good  Speech:  Normal Rate  Volume:  Normal  Mood:  Euthymic  Affect:  Blunt  Thought Process:  Coherent and Descriptions of Associations: Intact  Orientation:  Full (Time, Place, and Person)  Thought Content:  WDL and Logical  Suicidal Thoughts:  No  Homicidal Thoughts:  No  Memory:  Immediate;   Good Recent;   Fair Remote;   Fair  Judgement:  Fair  Insight:  Fair  Psychomotor Activity:  Decreased  Concentration:  Concentration: Good and Attention Span: Good  Recall:  [place] of Knowledge:  Good  Language:  Good  Akathisia:  No  Handed:  Right  AIMS (if indicated):     Assets:  Housing Leisure Time Resilience Social Support  ADL's:  Intact  Cognition:  Impaired,  Mild  Sleep:       Treatment Plan Summary: Agitation: -Started Seroquel 25 mg at bedtime  Social Work Consult: Would benefit from home health services Information from Education officer, museum to family/friends regarding placement would be beneficial also  Disposition: No evidence of imminent risk to self or others at present.   Supportive therapy provided about ongoing stressors.  Waylan Boga, NP 04/06/2019 5:36 PM

## 2019-04-06 NOTE — Progress Notes (Signed)
Pt was setting off her chair alarm trying to get up and walk to the bathroom.  When asked why she was wanting to get up, she was trying to say she needed to go to the bathroom and needed her walker, but she couldn't remember the word walker or bathroom.

## 2019-04-06 NOTE — Progress Notes (Signed)
Patient argumentative, confused, cussing out staff. Told charge nurse to "Fuck Off" Dr Marcille Blanco contacted Ativan and Haldol ordered and given. Patient may benefit from a sitter.

## 2019-04-06 NOTE — Progress Notes (Signed)
Patient hard to redirect, call bell goes off every 5-10 while awake. Sacral dressing removed

## 2019-04-06 NOTE — Progress Notes (Addendum)
Patient is confused, presses the call bell for nurse. Even when nurse is in room talking to her. Trying to climb out of low bed around rails. Difficult to redirect and is forgetful. Needs to be remind to not get up on her own, where she is and why she is here.

## 2019-04-06 NOTE — Progress Notes (Signed)
Assumed patient assigned today @ 1300. Patient confused and disoriented during shift. Also restless. She appears able to feed herself, however unable to provide other measures of person care. Multiple attempts to reorient patient... all attempts unsuccessful.

## 2019-04-06 NOTE — Progress Notes (Signed)
[facility] [place] NAME: Holly Conner    MR#:  [id number]  DATE OF BIRTH:  1941-02-15  SUBJECTIVE:   Patient developed periods of agitation and combativeness overnight and received some Haldol and Ativan.  She still seems confused.  No other acute events overnight.  REVIEW OF SYSTEMS:    Review of Systems  Unable to perform ROS: Mental acuity    Nutrition: Heart Healthy/Carb modified Tolerating Diet: Yes Tolerating PT: Eval noted.   DRUG ALLERGIES:   Allergies  Allergen Reactions  . Iodinated Diagnostic Agents Shortness Of Breath    Only with nuclear stress test per pt Only with nuclear stress test per pt   . Penicillins Rash    Rash at injection site. Pt states she can take Amoxicillin & Keflex   . Escitalopram   . Lithium   . Erythromycin Nausea Only and Nausea And Vomiting    Other reaction(s): Other (See Comments) dehydration     VITALS:  Blood pressure (!) 146/58, pulse 70, temperature 97.9 F (36.6 C), temperature source Oral, resp. rate 18, height 5\' 1"  (1.549 m), weight 85.1 kg, SpO2 99 %.  PHYSICAL EXAMINATION:   Physical Exam  GENERAL:  78 y.o.-year-old patient lying in bed confused but in NAD.  EYES: Pupils equal, round, reactive to light and accommodation. No scleral icterus. Extraocular muscles intact.  HEENT: Head atraumatic, normocephalic. Oropharynx and nasopharynx clear.  NECK:  Supple, no jugular venous distention. No thyroid enlargement, no tenderness.  LUNGS: Normal breath sounds bilaterally, no wheezing, rales, rhonchi. No use of accessory muscles of respiration.  CARDIOVASCULAR: S1, S2 normal. No murmurs, rubs, or gallops.  ABDOMEN: Soft, nontender, nondistended. Bowel sounds present. No organomegaly or mass.  EXTREMITIES: No cyanosis, clubbing or edema b/l.    NEUROLOGIC: Cranial nerves II through XII are intact. No focal Motor or sensory deficits b/l. Globally weak.    PSYCHIATRIC: The  patient is alert and oriented x 1.  SKIN: No obvious rash, lesion, or ulcer.    LABORATORY PANEL:   CBC Recent Labs  Lab 04/06/19 0541  WBC 7.0  HGB 10.0*  HCT 29.5*  PLT 281   ------------------------------------------------------------------------------------------------------------------  Chemistries  Recent Labs  Lab 04/03/19 1953  04/06/19 0541  NA 142   < > 140  K 3.6   < > 4.1  CL 107   < > 110  CO2 23   < > 22  GLUCOSE 196*   < > 291*  BUN 56*   < > 53*  CREATININE 2.69*   < > 2.48*  CALCIUM 9.4   < > 8.6*  AST 78*  --   --   ALT 57*  --   --   ALKPHOS 66  --   --   BILITOT 0.9  --   --    < > = values in this interval not displayed.   ------------------------------------------------------------------------------------------------------------------  Cardiac Enzymes No results for input(s): TROPONINI in the last 168 hours. ------------------------------------------------------------------------------------------------------------------  RADIOLOGY:  No results found.   ASSESSMENT AND PLAN:   78 year old female with past medical history of diabetes, hypertension, urinary incontinence, hyperlipidemia who presented to the hospital due to altered mental status and fall.  1.  Altered mental status/fall-secondary to hypoglycemia/uncderlying dementia.  -Hypoglycemia has resolved but patient continues to be quite altered and confused.  She probably has underlying cognitive decline/dementia.  Will give PRN Haldol for sundowning/agitation. -Also started on low-dose Seroquel. -Continue to follow  mental status.  Avoid Ativan.  2.  Hypoglycemia- because of patient's altered mental status.  Blood sugars improved with dextrose infusion. -Continue to hold patient's Tresiba insulin and also Victoza for now.  Continue sliding scale insulin for now and BS stable.   3.  Hyperlipidemia-continue atorvastatin.  4.  Essential hypertension-continue Norvasc, losartan.  5.   Recurrent falls- seen by physical therapy and they recommend home health rehab but pt's POA wants Assisted Living/SNF.   6.  CKD stage III-patient's creatinine is close to baseline and will continue to monitor.  Discussed plan of care with POA and she is wanting the patient to be placed to a skilled nursing facility/assisted living due to her recurrent falls and her cognitive decline.  We will have social work discuss this with patient's POA.     All the records are reviewed and case discussed with Care Management/Social Worker. Management plans discussed with the patient, family and they are in agreement.  CODE STATUS: Full code  DVT Prophylaxis: Hep SQ  TOTAL TIME TAKING CARE OF THIS PATIENT: 30 minutes.   POSSIBLE D/C IN 1-2 DAYS, DEPENDING ON CLINICAL CONDITION.   Henreitta Leber M.D on 04/06/2019 at 1:55 PM  Between 7am to 6pm - Pager - [phone]  After 6pm go to [email or web address] - Proofreader  [facility]  Office  [phone]  CC: Primary care physician; Dion Body, MD

## 2019-04-06 NOTE — Progress Notes (Signed)
Patient restless, no safety awareness, chair alarm on. Patient caught just time from trying to sit on leg rest of chair. Patient reoriented. IV pulled out, placed order for IV team.

## 2019-04-07 LAB — SARS CORONAVIRUS 2 BY RT PCR (HOSPITAL ORDER, PERFORMED IN ~~LOC~~ HOSPITAL LAB): SARS Coronavirus 2: NEGATIVE

## 2019-04-07 LAB — GLUCOSE, CAPILLARY: Glucose-Capillary: 330 mg/dL — ABNORMAL HIGH (ref 70–99)

## 2019-04-07 MED ORDER — QUETIAPINE FUMARATE 25 MG PO TABS: 12.5000 mg | ORAL_TABLET | Freq: Every day | ORAL | Status: DC

## 2019-04-07 MED ORDER — HALOPERIDOL LACTATE 5 MG/ML IJ SOLN
2.5000 mg | Freq: Four times a day (QID) | INTRAMUSCULAR | Status: DC | PRN
  Administered 2019-04-07: 2.5 mg via INTRAMUSCULAR
  Filled 2019-04-07: qty 1

## 2019-04-07 MED ORDER — QUETIAPINE FUMARATE 25 MG PO TABS
12.5000 mg | ORAL_TABLET | Freq: Every day | ORAL | Status: DC
  Administered 2019-04-07: 12.5 mg via ORAL
  Filled 2019-04-07: qty 1

## 2019-04-07 MED ORDER — QUETIAPINE FUMARATE 25 MG PO TABS: 25.0000 mg | ORAL_TABLET | Freq: Every day | ORAL | Status: DC

## 2019-04-07 MED ORDER — HALOPERIDOL 5 MG PO TABS
2.5000 mg | ORAL_TABLET | Freq: Four times a day (QID) | ORAL | Status: DC | PRN
  Filled 2019-04-07: qty 0.5

## 2019-04-07 NOTE — Care Management Important Message (Signed)
Important Message  Patient Details  Name: Holly Conner MRN: [id number] Date of Birth: 07-12-1941   Medicare Important Message Given:  Other (see comment)  Patient sleeping and unable to obtain signature.  A copy of the Important Message from Medicare left on patients bedside table.   Juliann Pulse A Celenia Hruska 04/07/2019, 11:30 AM

## 2019-04-07 NOTE — Discharge Summary (Signed)
[place] [facility] [place] NAME: Holly Conner    MR#:  [id number]  DATE OF BIRTH:  05-Aug-1941  DATE OF ADMISSION:  04/03/2019 ADMITTING PHYSICIAN: Lance Coon, MD  DATE OF DISCHARGE: 04/07/2019  PRIMARY CARE PHYSICIAN: Dion Body, MD    ADMISSION DIAGNOSIS:  Hypoglycemia [E16.2] Hypothermia, initial encounter [T68.XXXA]  DISCHARGE DIAGNOSIS:  Principal Problem:   Unresponsive episode Active Problems:   Type 2 diabetes mellitus (HCC)   HTN (hypertension)   GERD (gastroesophageal reflux disease)   HLD (hyperlipidemia)   CKD (chronic kidney disease), stage IV (HCC)   Hypoglycemia   Elevated troponin   Unresponsive   Memory changes   SECONDARY DIAGNOSIS:   Past Medical History:  Diagnosis Date  . Arthritis   . Depression    controlled  . Diabetes mellitus without complication ([place])    managed well;   Marland Kitchen GERD (gastroesophageal reflux disease)   . Hepatitis   . HLD (hyperlipidemia)   . Hypertension    somewhat controlled  . 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 Polymyalgia rheumatica ([place])   . Sleep apnea   . Stroke [facility])     HOSPITAL COURSE:   78 year old female with past medical history of diabetes, hypertension, urinary incontinence, hyperlipidemia who presented to the hospital due to altered mental status and fall.  1.  Altered mental status/fall-secondary to hypoglycemia/uncderlying dementia.  -Patient's hypoglycemia has resolved with dextrose infusion.  Despite having the hypoglycemia resolved patient continues to have episodes of sundowning and agitation and this is likely led to patient's underlying cognitive decline/dementia combined with recent stroke. -A psychiatric consult was obtained.  Patient has been started on some Seroquel and also PRN Haldol for agitation.  She continues to be agitated but cooperative at times. -Further changes to her meds can be done as an outpatient.  2.  DM  with Hypoglycemia-  due to patient's poor p.o. intake and not taking her insulin properly given her underlying cognitive decline. -Blood sugars have improved.  She is actually hyperglycemic now she will resume her [place], NovoLog with meals and also Victoza.  Further changes to her diabetic regimen can be done as an outpatient.    3.  Hyperlipidemia- she will continue atorvastatin.  4.  Essential hypertension- she will continue Norvasc, losartan.  5.  Recurrent falls-  due to patient's recurrent falls and cognitive decline physical therapy consult obtained and they recommended home health but she is likely not safe to be at home presently and therefore is being discharged to a skilled nursing facility for ongoing care.  6.  CKD stage III-patient's creatinine is close to baseline and can be further followed as outpatient.   7. Hx of Urinary Incontinence - pt. Will cont. Her Vesicare and Flomax.   DISCHARGE CONDITIONS:   Stable.   CONSULTS OBTAINED:    DRUG ALLERGIES:   Allergies  Allergen Reactions  . Iodinated Diagnostic Agents Shortness Of Breath    Only with nuclear stress test per pt Only with nuclear stress test per pt   . Penicillins Rash    Rash at injection site. Pt states she can take Amoxicillin & Keflex   . Escitalopram   . Lithium   . Erythromycin Nausea Only and Nausea And Vomiting    Other reaction(s): Other (See Comments) dehydration     DISCHARGE MEDICATIONS:   Allergies as of 04/07/2019      Reactions   Iodinated Diagnostic Agents  Shortness Of Breath   Only with nuclear stress test per pt Only with nuclear stress test per pt   Penicillins Rash   Rash at injection site. Pt states she can take Amoxicillin & Keflex   Escitalopram    Lithium    Erythromycin Nausea Only, Nausea And Vomiting   Other reaction(s): Other (See Comments) dehydration      Medication List    TAKE these medications   amLODipine 10 MG tablet Commonly known as:  NORVASC Take 1 tablet (10 mg total) by mouth daily.   aspirin EC 81 MG tablet Take 1 tablet (81 mg total) by mouth daily.   atorvastatin 80 MG tablet Commonly known as: LIPITOR Take 80 mg by mouth daily.   loratadine 10 MG tablet Commonly known as: CLARITIN Take 10 mg by mouth daily as needed for allergies.   losartan 50 MG tablet Commonly known as: COZAAR Take 50 mg by mouth daily.   multivitamin with minerals Tabs tablet Take 1 tablet by mouth daily.   NovoLOG FlexPen 100 UNIT/ML FlexPen Generic drug: insulin aspart Inject 7-9 Units into the skin 3 (three) times daily with meals.   pyridOXINE 100 MG tablet Commonly known as: VITAMIN B-6 Take 100 mg by mouth daily.   QUEtiapine 25 MG tablet Commonly known as: SEROQUEL Take 1 tablet (25 mg total) by mouth at bedtime.   QUEtiapine 25 MG tablet Commonly known as: SEROQUEL Take 0.5 tablets (12.5 mg total) by mouth daily. Start taking on: April 08, 2019   solifenacin 10 MG tablet Commonly known as: VESICARE Take 1 tablet (10 mg total) by mouth daily.   tamsulosin 0.4 MG Caps capsule Commonly known as: FLOMAX Take 1 capsule (0.4 mg total) by mouth at bedtime.   Tyler Aas FlexTouch 200 UNIT/ML Sopn Generic drug: Insulin Degludec Inject 30 Units into the skin daily.   Victoza 18 MG/3ML Sopn Generic drug: liraglutide Victoza 3-Pak 0.6 mg/0.1 mL (18 mg/3 mL) subcutaneous pen injector   VITAMIN B-12 PO Take by mouth.   vitamin C 1000 MG tablet Take 1,000 mg by mouth daily.         DISCHARGE INSTRUCTIONS:   DIET:  Cardiac diet and Diabetic diet  DISCHARGE CONDITION:  Stable  ACTIVITY:  Activity as tolerated  OXYGEN:  Home Oxygen: No.   Oxygen Delivery: room air  DISCHARGE LOCATION:  nursing home   If you experience worsening of your admission symptoms, develop shortness of breath, life threatening emergency, suicidal or homicidal thoughts you must seek medical attention immediately by calling  911 or calling your MD immediately  if symptoms less severe.  You Must read complete instructions/literature along with all the possible adverse reactions/side effects for all the Medicines you take and that have been prescribed to you. Take any new Medicines after you have completely understood and accpet all the possible adverse reactions/side effects.   Please note  You were cared for by a hospitalist during your hospital stay. If you have any questions about your discharge medications or the care you received while you were in the hospital after you are discharged, you can call the unit and asked to speak with the hospitalist on call if the hospitalist that took care of you is not available. Once you are discharged, your primary care physician will handle any further medical issues. Please note that NO REFILLS for any discharge medications will be authorized once you are discharged, as it is imperative that you return to your primary care physician (or  establish a relationship with a primary care physician if you do not have one) for your aftercare needs so that they can reassess your need for medications and monitor your lab values.     Today   Status stable, blood sugars are stable.  Having some periods of confusion and agitation.  Discussed with patient's POA and she would prefer discharge to a skilled nursing facility.  Will discharge to skilled nursing facility repeat COVID-19 test is negative.  VITAL SIGNS:  Blood pressure (!) 134/53, pulse 66, temperature 98 F (36.7 C), temperature source Oral, resp. rate 16, height 5\' 1"  (1.549 m), weight 85.1 kg, SpO2 97 %.  I/O:    Intake/Output Summary (Last 24 hours) at 04/07/2019 1103 Last data filed at 04/07/2019 1010 Gross per 24 hour  Intake 480 ml  Output 500 ml  Net -20 ml    PHYSICAL EXAMINATION:  GENERAL:  78 y.o.-year-old patient lying in the bed confused but in NAD.  EYES: Pupils equal, round, reactive to light and  accommodation. No scleral icterus. Extraocular muscles intact.  HEENT: Head atraumatic, normocephalic. Oropharynx and nasopharynx clear.  NECK:  Supple, no jugular venous distention. No thyroid enlargement, no tenderness.  LUNGS: Normal breath sounds bilaterally, no wheezing, rales,rhonchi. No use of accessory muscles of respiration.  CARDIOVASCULAR: S1, S2 normal. No murmurs, rubs, or gallops.  ABDOMEN: Soft, non-tender, non-distended. Bowel sounds present. No organomegaly or mass.  EXTREMITIES: No pedal edema, cyanosis, or clubbing.  NEUROLOGIC: Cranial nerves II through XII are intact. No focal motor or sensory defecits b/l.  PSYCHIATRIC: The patient is alert and oriented x 2.   SKIN: No obvious rash, lesion, or ulcer.   DATA REVIEW:   CBC Recent Labs  Lab 04/06/19 0541  WBC 7.0  HGB 10.0*  HCT 29.5*  PLT 281    Chemistries  Recent Labs  Lab 04/03/19 1953  04/06/19 0541  NA 142   < > 140  K 3.6   < > 4.1  CL 107   < > 110  CO2 23   < > 22  GLUCOSE 196*   < > 291*  BUN 56*   < > 53*  CREATININE 2.69*   < > 2.48*  CALCIUM 9.4   < > 8.6*  AST 78*  --   --   ALT 57*  --   --   ALKPHOS 66  --   --   BILITOT 0.9  --   --    < > = values in this interval not displayed.    Cardiac Enzymes No results for input(s): TROPONINI in the last 168 hours.  Microbiology Results  Results for orders placed or performed during the hospital encounter of 04/03/19  Culture, blood (routine x 2)     Status: None (Preliminary result)   Collection Time: 04/03/19  7:53 PM   Specimen: BLOOD  Result Value Ref Range Status   Specimen Description BLOOD RIGHT ANTECUBITAL  Final   Special Requests   Final    BOTTLES DRAWN AEROBIC AND ANAEROBIC Blood Culture adequate volume   Culture   Final    NO GROWTH 4 DAYS Performed at [facility], [address] [place], [place] [address]    Report Status PENDING  Incomplete  SARS Coronavirus 2 [facility] order, Performed in [facility]  hospital lab) Nasopharyngeal Nasopharyngeal Swab     Status: None   Collection Time: 04/03/19  8:47 PM   Specimen: Nasopharyngeal Swab  Result Value Ref Range  Status   SARS Coronavirus 2 NEGATIVE NEGATIVE Final    Comment: (NOTE) If result is NEGATIVE SARS-CoV-2 target nucleic acids are NOT DETECTED. The SARS-CoV-2 RNA is generally detectable in upper and lower  respiratory specimens during the acute phase of infection. The lowest  concentration of SARS-CoV-2 viral copies this assay can detect is 250  copies / mL. A negative result does not preclude SARS-CoV-2 infection  and should not be used as the sole basis for treatment or other  patient management decisions.  A negative result may occur with  improper specimen collection / handling, submission of specimen other  than nasopharyngeal swab, presence of viral mutation(s) within the  areas targeted by this assay, and inadequate number of viral copies  (<250 copies / mL). A negative result must be combined with clinical  observations, patient history, and epidemiological information. If result is POSITIVE SARS-CoV-2 target nucleic acids are DETECTED. The SARS-CoV-2 RNA is generally detectable in upper and lower  respiratory specimens dur ing the acute phase of infection.  Positive  results are indicative of active infection with SARS-CoV-2.  Clinical  correlation with patient history and other diagnostic information is  necessary to determine patient infection status.  Positive results do  not rule out bacterial infection or co-infection with other viruses. If result is PRESUMPTIVE POSTIVE SARS-CoV-2 nucleic acids MAY BE PRESENT.   A presumptive positive result was obtained on the submitted specimen  and confirmed on repeat testing.  While 2019 novel coronavirus  (SARS-CoV-2) nucleic acids may be present in the submitted sample  additional confirmatory testing may be necessary for epidemiological  and / or clinical management  purposes  to differentiate between  SARS-CoV-2 and other Sarbecovirus currently known to infect humans.  If clinically indicated additional testing with an alternate test  methodology [phone]) is advised. The SARS-CoV-2 RNA is generally  detectable in upper and lower respiratory sp ecimens during the acute  phase of infection. The expected result is Negative. Fact Sheet for Patients:  StrictlyIdeas.no Fact Sheet for Healthcare Providers: BankingDealers.co.za This test is not yet approved or cleared by the [place] FDA and has been authorized for detection and/or diagnosis of SARS-CoV-2 by FDA under an Emergency Use Authorization (EUA).  This EUA will remain in effect (meaning this test can be used) for the duration of the COVID-19 declaration under Section 564(b)(1) of the Act, 21 U.S.C. section 360bbb-3(b)(1), unless the authorization is terminated or revoked sooner. Performed at [facility], [address] [place], [place] [address]   Culture, blood (routine x 2)     Status: None (Preliminary result)   Collection Time: 04/04/19  1:09 AM   Specimen: BLOOD  Result Value Ref Range Status   Specimen Description BLOOD LEFT ASSIST CONTROL  Final   Special Requests   Final    BOTTLES DRAWN AEROBIC ONLY Blood Culture adequate volume   Culture   Final    NO GROWTH 3 DAYS Performed at [facility], [address], [place], [place] [address]    Report Status PENDING  Incomplete    RADIOLOGY:  No results found.    Management plans discussed with the patient, family and they are in agreement.  CODE STATUS:     Code Status Orders  (From admission, onward)         Start     Ordered   04/04/19 0103  Full code  Continuous     04/04/19 0102  TOTAL TIME TAKING CARE OF THIS PATIENT: 45 minutes.    Henreitta Leber M.D on 04/07/2019 at 11:03 AM  Between 7am to 6pm - Pager - [phone]  After  6pm go to [email or web address] - Proofreader  [facility]  Office  [phone]  CC: Primary care physician; Dion Body, MD

## 2019-04-07 NOTE — TOC Transition Note (Signed)
Transition of Care [facility]) - CM/SW Discharge Note   Patient Details  Name: Holly Conner MRN: [id number] Date of Birth: 1941-03-13  [facility] CM/SW Contact:  Holly Conner, Lenice Llamas Phone Number: [phone]  04/07/2019, 2:49 PM   Clinical Narrative: Clinical Social Worker (CSW) discussed case with MD. Per MD patient needs SNF placement and can't go home safely. CSW met with patient who was pleasant and alert and oriented X2. Patient was oriented to self and place. Patient was not oriented to time. Patient did know it was the year 2020. Patient is agreeable to SNF placement. FL2 complete and faxed out. Per patient she lives with her friends Roselyn Reef and Apolonio Schneiders. CSW presented bed offers to patient and provided her the CMS SNF list. Patient chose [facility]. CSW contacted patient's friend/ HPOA Apolonio Schneiders and made her aware of above. Per Apolonio Schneiders patient lives in [place] with a roommate Roselyn Reef and Roselyn Reef is [name]'s mother. Apolonio Schneiders reported that she is patient's HPOA and has taken care of her for several years. Per Apolonio Schneiders she lives close to patient and Roselyn Reef. Apolonio Schneiders reported that she has been trying to get patient placed in a facility and extra home care for a while. CSW explained that patient can go to [facility] under medicare for short term rehab. CSW explained that medicare will not pay for long term care in a facility. CSW explained that patient will have to pay out of pocket for SNF or apply for long term care medicaid. Apolonio Schneiders verbalized her understanding and is agreeable for patient to D/C to [facility].  Patient is medically stable for D/C to [facility] today. Per [facility] admissions coordinator at [facility] patient can come today to room 36-A. Per [name] they will help Apolonio Schneiders apply for medicaid and assist with the D/C plan after rehab. Patient had a negative covid test today 8/10. RN will call report and arrange EMS for transport. CSW sent D/C orders  to [facility] via [facility]. Patient and her HPOA Apolonio Schneiders are aware of D/C today. Please reconsult if future social work needs arise. CSW signing off.       Final next level of care: Skilled Nursing Facility Barriers to Discharge: Barriers Resolved   Patient Goals and CMS Choice Patient states their goals for this hospitalization and ongoing recovery are:: To feel better CMS Medicare.gov Compare Post Acute Care list provided to:: Patient Choice offered to / list presented to : Patient  Discharge Placement   Existing PASRR number confirmed : 04/07/19          Patient [facility] bed at: [facility] Patient to be transferred to facility by: [facility] Name of family member notified: Patient's friend/ HPOA Apolonio Schneiders is aware of D/C today. Patient and family notified of of transfer: 04/07/19  Discharge Plan and Services                DME Arranged: N/A         HH Arranged: NA          Social Determinants of Health (SDOH) Interventions     Readmission Risk Interventions No flowsheet data found.

## 2019-04-07 NOTE — Progress Notes (Signed)
   04/07/19 1600  Clinical Encounter Type  Visited With Patient  Visit Type Initial  Referral From Chaplain  Consult/Referral To Chaplain   This chaplain visited the patient after noting that her room telephone was ringing. Upon arrival, the patient was seen to be looking around the room/bed as if she was trying to determine the location of the ringing. This chaplain asked the patient if she needed assistance, to which she agreed. Chaplain put the phone onto the bed and made the patient aware of its location. No other needs at this time.

## 2019-04-07 NOTE — Progress Notes (Signed)
Offered to help patient wash up. Pt says they would like to have a bath, but she covered her head up with the blanket and wouldn't talk to me when I tried to discuss a bath.

## 2019-04-07 NOTE — Progress Notes (Signed)
Physical Therapy Treatment Patient Details Name: Holly Conner MRN: [id number] DOB: 07-02-1941 Today's Date: 04/07/2019    History of Present Illness Pt is 78 year old female patient with diabetes mellitus type 2, GERD, CVA, CKD stage III, admitted because of mechanical fall with hypoglycemia (blood sugar of 30 in ED). 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    PT Comments    Patient with RN and CNA at bedside, agreeable to attempt to mobilize. Pt much more lethargic this session (suspect due to medications) often with eyes closed, decreased quality of movement and decreased velocity. Pt ambulated in room and utilized standard commode with PT, CGA/supervision throughout. Pt demonstrated decreased safety awareness and some impulsivity as well. Pt did not need physical assist for transfers or ambulation but needed close supervision for safety. Discharge planning updated to included 24/7 supervision to address safety concerns. The patient would benefit from further skilled PT intervention to maximize independence, mobility, and decrease risk of falls.     Follow Up Recommendations  Supervision/Assistance - 24 hour;Home health PT     Equipment Recommendations  None recommended by PT(has RW at home)    Recommendations for Other Services       Precautions / Restrictions Precautions Precautions: Fall Restrictions Weight Bearing Restrictions: No    Mobility  Bed Mobility Overal bed mobility: Modified Independent                Transfers Overall transfer level: Needs assistance Equipment used: Rolling walker (2 wheeled) Transfers: Sit to/from Stand Sit to Stand: Supervision         General transfer comment: impulsive, no physical assist needed but verbal cues for hand placement to increase safety. Heavy reliance on UE support to rise (bed rails, commode grab  bars)  Ambulation/Gait Ambulation/Gait assistance: Min guard Gait Distance (Feet): 20 Feet Assistive device: Rolling walker (2 wheeled)   Gait velocity: decreased   General Gait Details: Pt with RW outside BOS this session, eyes often closed, would impulsively turn or let go of walker without verbal cues   Stairs             Wheelchair Mobility    Modified Rankin (Stroke Patients Only)       Balance Overall balance assessment: Needs assistance Sitting-balance support: No upper extremity supported Sitting balance-Leahy Scale: Good       Standing balance-Leahy Scale: Poor Standing balance comment: often reaches outside of RW for support                            Cognition Arousal/Alertness: Lethargic;Suspect due to medications Behavior During Therapy: Restless Overall Cognitive Status: No family/caregiver present to determine baseline cognitive functioning                                        Exercises Other Exercises Other Exercises: standard commode transfer with use of grab bars. supervision and verbal cues for safety. Other Exercises: seated marching, seated long arc quads pt actively trying to lay down , PT amendable due to pt lethargy    General Comments        Pertinent Vitals/Pain Pain Assessment: No/denies pain    Home Living  Prior Function            PT Goals (current goals can now be found in the care plan section) Progress towards PT goals: Progressing toward goals    Frequency    Min 2X/week      PT Plan Discharge plan needs to be updated    Co-evaluation              AM-PAC PT "6 Clicks" Mobility   Outcome Measure  Help needed turning from your back to your side while in a flat bed without using bedrails?: None Help needed moving from lying on your back to sitting on the side of a flat bed without using bedrails?: None Help needed moving to and from a bed to  a chair (including a wheelchair)?: A Little Help needed standing up from a chair using your arms (e.g., wheelchair or bedside chair)?: None Help needed to walk in hospital room?: A Little Help needed climbing 3-5 steps with a railing? : A Little 6 Click Score: 21    End of Session Equipment Utilized During Treatment: Gait belt Activity Tolerance: Patient limited by lethargy Patient left: in bed;with call bell/phone within reach;with bed alarm set Nurse Communication: Mobility status PT Visit Diagnosis: Muscle weakness (generalized) (M62.81);Difficulty in walking, not elsewhere classified (R26.2);History of falling (Z91.81);Other abnormalities of gait and mobility (R26.89)     Time: [id number] PT Time Calculation (min) (ACUTE ONLY): 13 min  Charges:  $Therapeutic Exercise: 8-22 mins                     Lieutenant Diego PT, DPT 11:01 AM,04/07/19 [id number]

## 2019-04-07 NOTE — NC FL2 (Signed)
[place] LEVEL OF CARE SCREENING TOOL     IDENTIFICATION  Patient Name: Holly Conner Birthdate: March 19, 1941 Sex: female Admission Date (Current Location): 04/03/2019  [place] and [place] Number:  Engineering geologist and Address:  [facility], [address] [place], [place] [address]      Provider Number: [id number]  Attending Physician Name and Address:  Henreitta Leber, MD  Relative Name and Phone Number:       Current Level of Care: Hospital Recommended Level of Care: [place] Prior Approval Number:    Date Approved/Denied:   PASRR Number: [id number]  Discharge Plan: SNF    Current Diagnoses: Patient Active Problem List   Diagnosis Date Noted  . Memory changes 04/05/2019  . Unresponsive 04/04/2019  . Unresponsive episode 04/03/2019  . Hypoglycemia 04/03/2019  . Elevated troponin 04/03/2019  . Weakness generalized 03/28/2019  . Hypertensive urgency 12/18/2018  . Stroke ([place]) 12/19/2015  . Type 2 diabetes mellitus ([place]) 12/19/2015  . HTN (hypertension) 12/19/2015  . GERD (gastroesophageal reflux disease) 12/19/2015  . HLD (hyperlipidemia) 12/19/2015  . CKD (chronic kidney disease), stage IV ([place]) 12/19/2015    Orientation RESPIRATION BLADDER Height & Weight     Self  Normal Continent Weight: 187 lb 9.6 oz (85.1 kg) Height:  5\' 1"  (154.9 cm)  BEHAVIORAL SYMPTOMS/MOOD NEUROLOGICAL BOWEL NUTRITION STATUS      Continent Diet(Diet: Heart Healthy/ Carb Modified.)  AMBULATORY STATUS COMMUNICATION OF NEEDS Skin   Extensive Assist Verbally Normal                       Personal Care Assistance Level of Assistance  Bathing, Feeding, Dressing Bathing Assistance: Limited assistance Feeding assistance: Limited assistance Dressing Assistance: Limited assistance     Functional Limitations Info  Sight, Hearing, Speech Sight Info: Adequate Hearing Info: Adequate Speech Info: Adequate    SPECIAL  CARE FACTORS FREQUENCY  PT (By licensed PT), OT (By licensed OT)     PT Frequency: 5 OT Frequency: 5            Contractures      Additional Factors Info  Code Status, Allergies Code Status Info: Full Code. Allergies Info: Iodinated Diagnostic Agents, Penicillins, Escitalopram, Lithium, Erythromycin           Current Medications (04/07/2019):  This is the current hospital active medication list Current Facility-Administered Medications  Medication Dose Route Frequency Provider Last Rate Last Dose  . acetaminophen (TYLENOL) tablet 650 mg  650 mg Oral Q6H PRN Lance Coon, MD       Or  . acetaminophen (TYLENOL) suppository 650 mg  650 mg Rectal Q6H PRN Lance Coon, MD      . amLODipine (NORVASC) tablet 10 mg  10 mg Oral Daily Lance Coon, MD   10 mg at 04/05/19 [id number]  . aspirin EC tablet 81 mg  81 mg Oral Daily Lance Coon, MD   81 mg at 04/05/19 [id number]  . atorvastatin (LIPITOR) tablet 80 mg  80 mg Oral Daily Lance Coon, MD   80 mg at 04/05/19 [id number]  . haloperidol (HALDOL) tablet 2.5 mg  2.5 mg Oral Q6H PRN Henreitta Leber, MD       Or  . haloperidol lactate (HALDOL) injection 2.5 mg  2.5 mg Intramuscular Q6H PRN Henreitta Leber, MD      . heparin injection 5,000 Units  5,000 Units Subcutaneous Camelia Phenes Lance Coon, MD   5,000 Units at 04/07/19 0548  .  hydrALAZINE (APRESOLINE) injection 10 mg  10 mg Intravenous Q4H PRN Lance Coon, MD   10 mg at 04/06/19 0017  . insulin aspart (novoLOG) injection 0-9 Units  0-9 Units Subcutaneous TID AC & HS Henreitta Leber, MD   5 Units at 04/06/19 2149  . losartan (COZAAR) tablet 50 mg  50 mg Oral Daily Lance Coon, MD   50 mg at 04/05/19 [id number]  . ondansetron (ZOFRAN) tablet 4 mg  4 mg Oral Q6H PRN Lance Coon, MD       Or  . ondansetron [facility]) injection 4 mg  4 mg Intravenous Q6H PRN Lance Coon, MD      . oxyCODONE (Oxy IR/ROXICODONE) immediate release tablet 5 mg  5 mg Oral Q4H PRN Lance Coon, MD      . polyethylene  glycol (MIRALAX / GLYCOLAX) packet 17 g  17 g Oral Daily Epifanio Lesches, MD   17 g at 04/05/19 0925  . QUEtiapine (SEROQUEL) tablet 12.5 mg  12.5 mg Oral Daily Patrecia Pour, NP      . QUEtiapine (SEROQUEL) tablet 25 mg  25 mg Oral QHS Patrecia Pour, NP   25 mg at 04/06/19 2142     Discharge Medications: Please see discharge summary for a list of discharge medications.  Relevant Imaging Results:  Relevant Lab Results:   Additional Information SSN: [id number]  Gianna Calef, Veronia Beets, [place]

## 2019-04-07 NOTE — Plan of Care (Signed)
Patient had been calmer at he start of the shift. She remains confused and restless. Will call for staff but then say she can't remember why she called. She has some expressive aphasia, but does come up with the word if given time. For example "walker." or "bathroom"    Problem: Education: Goal: Knowledge of General Education information will improve Description: Including pain rating scale, medication(s)/side effects and non-pharmacologic comfort measures Outcome: Progressing   Problem: Health Behavior/Discharge Planning: Goal: Ability to manage health-related needs will improve Outcome: Progressing   Problem: Clinical Measurements: Goal: Ability to maintain clinical measurements within normal limits will improve Outcome: Progressing Goal: Will remain free from infection Outcome: Progressing Goal: Diagnostic test results will improve Outcome: Progressing Goal: Respiratory complications will improve Outcome: Progressing Goal: Cardiovascular complication will be avoided Outcome: Progressing   Problem: Activity: Goal: Risk for activity intolerance will decrease Outcome: Progressing   Problem: Nutrition: Goal: Adequate nutrition will be maintained Outcome: Progressing   Problem: Coping: Goal: Level of anxiety will decrease Outcome: Progressing   Problem: Elimination: Goal: Will not experience complications related to urinary retention Outcome: Progressing   Problem: Safety: Goal: Ability to remain free from injury will improve Outcome: Progressing   Problem: Skin Integrity: Goal: Risk for impaired skin integrity will decrease Outcome: Progressing

## 2019-04-08 LAB — CULTURE, BLOOD (ROUTINE X 2)
Culture: NO GROWTH
Special Requests: ADEQUATE

## 2019-04-09 LAB — CULTURE, BLOOD (ROUTINE X 2)
Culture: NO GROWTH
Special Requests: ADEQUATE

## 2019-05-02 ENCOUNTER — Other Ambulatory Visit: Payer: Self-pay

## 2019-05-02 ENCOUNTER — Emergency Department: Payer: Medicare Other

## 2019-05-02 ENCOUNTER — Inpatient Hospital Stay: Payer: Medicare Other

## 2019-05-02 ENCOUNTER — Inpatient Hospital Stay
Admission: EM | Admit: 2019-05-02 | Discharge: 2019-05-05 | DRG: 291 | Disposition: A | Discharge status: Skilled Nursing Facility | Payer: Medicare Other | Source: Skilled Nursing Facility | Attending: Internal Medicine | Admitting: Internal Medicine

## 2019-05-02 DIAGNOSIS — N179 Acute kidney failure, unspecified: Secondary | ICD-10-CM | POA: Diagnosis present

## 2019-05-02 DIAGNOSIS — K219 Gastro-esophageal reflux disease without esophagitis: Secondary | ICD-10-CM | POA: Diagnosis present

## 2019-05-02 DIAGNOSIS — Z20828 Contact with and (suspected) exposure to other viral communicable diseases: Secondary | ICD-10-CM | POA: Diagnosis present

## 2019-05-02 DIAGNOSIS — Z8673 Personal history of transient ischemic attack (TIA), and cerebral infarction without residual deficits: Secondary | ICD-10-CM | POA: Diagnosis not present

## 2019-05-02 DIAGNOSIS — I5033 Acute on chronic diastolic (congestive) heart failure: Secondary | ICD-10-CM | POA: Diagnosis present

## 2019-05-02 DIAGNOSIS — I2510 Atherosclerotic heart disease of native coronary artery without angina pectoris: Secondary | ICD-10-CM | POA: Diagnosis present

## 2019-05-02 DIAGNOSIS — D649 Anemia, unspecified: Secondary | ICD-10-CM | POA: Diagnosis present

## 2019-05-02 DIAGNOSIS — I214 Non-ST elevation (NSTEMI) myocardial infarction: Secondary | ICD-10-CM | POA: Diagnosis present

## 2019-05-02 DIAGNOSIS — N184 Chronic kidney disease, stage 4 (severe): Secondary | ICD-10-CM | POA: Diagnosis present

## 2019-05-02 DIAGNOSIS — Z7982 Long term (current) use of aspirin: Secondary | ICD-10-CM

## 2019-05-02 DIAGNOSIS — J90 Pleural effusion, not elsewhere classified: Secondary | ICD-10-CM

## 2019-05-02 DIAGNOSIS — Z79899 Other long term (current) drug therapy: Secondary | ICD-10-CM

## 2019-05-02 DIAGNOSIS — M353 Polymyalgia rheumatica: Secondary | ICD-10-CM | POA: Diagnosis present

## 2019-05-02 DIAGNOSIS — E1122 Type 2 diabetes mellitus with diabetic chronic kidney disease: Secondary | ICD-10-CM | POA: Diagnosis present

## 2019-05-02 DIAGNOSIS — J189 Pneumonia, unspecified organism: Secondary | ICD-10-CM | POA: Diagnosis present

## 2019-05-02 DIAGNOSIS — Z90710 Acquired absence of both cervix and uterus: Secondary | ICD-10-CM

## 2019-05-02 DIAGNOSIS — K760 Fatty (change of) liver, not elsewhere classified: Secondary | ICD-10-CM | POA: Diagnosis present

## 2019-05-02 DIAGNOSIS — I248 Other forms of acute ischemic heart disease: Secondary | ICD-10-CM | POA: Diagnosis present

## 2019-05-02 DIAGNOSIS — I130 Hypertensive heart and chronic kidney disease with heart failure and stage 1 through stage 4 chronic kidney disease, or unspecified chronic kidney disease: Secondary | ICD-10-CM | POA: Diagnosis present

## 2019-05-02 DIAGNOSIS — F329 Major depressive disorder, single episode, unspecified: Secondary | ICD-10-CM | POA: Diagnosis present

## 2019-05-02 DIAGNOSIS — J9621 Acute and chronic respiratory failure with hypoxia: Secondary | ICD-10-CM | POA: Diagnosis present

## 2019-05-02 DIAGNOSIS — Z794 Long term (current) use of insulin: Secondary | ICD-10-CM

## 2019-05-02 DIAGNOSIS — Z96651 Presence of right artificial knee joint: Secondary | ICD-10-CM | POA: Diagnosis present

## 2019-05-02 DIAGNOSIS — I5021 Acute systolic (congestive) heart failure: Secondary | ICD-10-CM

## 2019-05-02 DIAGNOSIS — J9601 Acute respiratory failure with hypoxia: Secondary | ICD-10-CM

## 2019-05-02 DIAGNOSIS — E785 Hyperlipidemia, unspecified: Secondary | ICD-10-CM | POA: Diagnosis present

## 2019-05-02 DIAGNOSIS — E1165 Type 2 diabetes mellitus with hyperglycemia: Secondary | ICD-10-CM | POA: Diagnosis present

## 2019-05-02 LAB — CBC WITH DIFFERENTIAL/PLATELET
Abs Immature Granulocytes: 0.05 10*3/uL (ref 0.00–0.07)
Basophils Absolute: 0 10*3/uL (ref 0.0–0.1)
Basophils Relative: 0 %
Eosinophils Absolute: 0.1 10*3/uL (ref 0.0–0.5)
Eosinophils Relative: 1 %
HCT: 22.4 % — ABNORMAL LOW (ref 36.0–46.0)
Hemoglobin: 7.1 g/dL — ABNORMAL LOW (ref 12.0–15.0)
Immature Granulocytes: 1 %
Lymphocytes Relative: 7 %
Lymphs Abs: 0.7 10*3/uL (ref 0.7–4.0)
MCH: 29.8 pg (ref 26.0–34.0)
MCHC: 31.7 g/dL (ref 30.0–36.0)
MCV: 94.1 fL (ref 80.0–100.0)
Monocytes Absolute: 0.3 10*3/uL (ref 0.1–1.0)
Monocytes Relative: 3 %
Neutro Abs: 9 10*3/uL — ABNORMAL HIGH (ref 1.7–7.7)
Neutrophils Relative %: 88 %
Platelets: 487 10*3/uL — ABNORMAL HIGH (ref 150–400)
RBC: 2.38 MIL/uL — ABNORMAL LOW (ref 3.87–5.11)
RDW: 15.4 % (ref 11.5–15.5)
WBC: 10.2 10*3/uL (ref 4.0–10.5)
nRBC: 0 % (ref 0.0–0.2)

## 2019-05-02 LAB — CBC
HCT: 20.7 % — ABNORMAL LOW (ref 36.0–46.0)
Hemoglobin: 6.7 g/dL — ABNORMAL LOW (ref 12.0–15.0)
MCH: 30 pg (ref 26.0–34.0)
MCHC: 32.4 g/dL (ref 30.0–36.0)
MCV: 92.8 fL (ref 80.0–100.0)
Platelets: 465 10*3/uL — ABNORMAL HIGH (ref 150–400)
RBC: 2.23 MIL/uL — ABNORMAL LOW (ref 3.87–5.11)
RDW: 15.1 % (ref 11.5–15.5)
WBC: 8.6 10*3/uL (ref 4.0–10.5)
nRBC: 0 % (ref 0.0–0.2)

## 2019-05-02 LAB — BASIC METABOLIC PANEL
Anion gap: 14 (ref 5–15)
BUN: 38 mg/dL — ABNORMAL HIGH (ref 8–23)
CO2: 18 mmol/L — ABNORMAL LOW (ref 22–32)
Calcium: 8.6 mg/dL — ABNORMAL LOW (ref 8.9–10.3)
Chloride: 104 mmol/L (ref 98–111)
Creatinine, Ser: 2.97 mg/dL — ABNORMAL HIGH (ref 0.44–1.00)
GFR calc Af Amer: 17 mL/min — ABNORMAL LOW (ref >60)
GFR calc non Af Amer: 14 mL/min — ABNORMAL LOW (ref >60)
Glucose, Bld: 331 mg/dL — ABNORMAL HIGH (ref 70–99)
Potassium: 4.5 mmol/L (ref 3.5–5.1)
Sodium: 136 mmol/L (ref 135–145)

## 2019-05-02 LAB — IRON AND TIBC
Iron: 51 ug/dL (ref 28–170)
Saturation Ratios: 23 % (ref 10.4–31.8)
TIBC: 227 ug/dL — ABNORMAL LOW (ref 250–450)
UIBC: 176 ug/dL

## 2019-05-02 LAB — FOLATE: Folate: 10.3 ng/mL (ref >5.9)

## 2019-05-02 LAB — PROTIME-INR
INR: 1 (ref 0.8–1.2)
Prothrombin Time: 13.3 seconds (ref 11.4–15.2)

## 2019-05-02 LAB — ABO/RH: ABO/RH(D): A POS

## 2019-05-02 LAB — BLOOD GAS, ARTERIAL
Acid-base deficit: 0.5 mmol/L (ref 0.0–2.0)
Bicarbonate: 23.1 mmol/L (ref 20.0–28.0)
FIO2: 0.32
O2 Saturation: 92.9 %
Patient temperature: 37
pCO2 arterial: 34 mmHg (ref 32.0–48.0)
pH, Arterial: 7.44 (ref 7.350–7.450)
pO2, Arterial: 64 mmHg — ABNORMAL LOW (ref 83.0–108.0)

## 2019-05-02 LAB — SAMPLE TO BLOOD BANK

## 2019-05-02 LAB — GLUCOSE, CAPILLARY: Glucose-Capillary: 82 mg/dL (ref 70–99)

## 2019-05-02 LAB — BRAIN NATRIURETIC PEPTIDE: B Natriuretic Peptide: 1860 pg/mL — ABNORMAL HIGH (ref 0.0–100.0)

## 2019-05-02 LAB — TROPONIN I (HIGH SENSITIVITY)
Troponin I (High Sensitivity): 923 ng/L (ref <18)
Troponin I (High Sensitivity): 917 ng/L (ref <18)

## 2019-05-02 LAB — SARS CORONAVIRUS 2 BY RT PCR (HOSPITAL ORDER, PERFORMED IN ~~LOC~~ HOSPITAL LAB): SARS Coronavirus 2: NEGATIVE

## 2019-05-02 LAB — APTT: aPTT: 25 seconds (ref 24–36)

## 2019-05-02 MED ORDER — ATORVASTATIN CALCIUM 80 MG PO TABS
80.0000 mg | ORAL_TABLET | Freq: Every evening | ORAL | Status: DC
  Administered 2019-05-02 – 2019-05-04 (×3): 80 mg via ORAL
  Filled 2019-05-02 (×2): qty 1
  Filled 2019-05-02: qty 4

## 2019-05-02 MED ORDER — VITAMIN C 500 MG PO TABS
1000.0000 mg | ORAL_TABLET | Freq: Every day | ORAL | Status: DC
  Administered 2019-05-03 – 2019-05-05 (×3): 1000 mg via ORAL
  Filled 2019-05-02 (×4): qty 2

## 2019-05-02 MED ORDER — INSULIN GLARGINE 100 UNIT/ML ~~LOC~~ SOLN
24.0000 [IU] | Freq: Every day | SUBCUTANEOUS | Status: DC
  Filled 2019-05-02: qty 0.24

## 2019-05-02 MED ORDER — FUROSEMIDE 10 MG/ML IJ SOLN
40.0000 mg | Freq: Once | INTRAMUSCULAR | Status: AC
  Administered 2019-05-02: 15:00:00 40 mg via INTRAVENOUS
  Filled 2019-05-02: qty 4

## 2019-05-02 MED ORDER — VITAMIN B-6 50 MG PO TABS
100.0000 mg | ORAL_TABLET | Freq: Every day | ORAL | Status: DC
  Administered 2019-05-03 – 2019-05-05 (×3): 100 mg via ORAL
  Filled 2019-05-02 (×4): qty 2

## 2019-05-02 MED ORDER — ONDANSETRON HCL 4 MG/2ML IJ SOLN: 4.0000 mg | Freq: Four times a day (QID) | INTRAMUSCULAR | Status: DC | PRN

## 2019-05-02 MED ORDER — ASPIRIN EC 81 MG PO TBEC
81.0000 mg | DELAYED_RELEASE_TABLET | Freq: Every day | ORAL | Status: DC
  Administered 2019-05-03 – 2019-05-05 (×3): 81 mg via ORAL
  Filled 2019-05-02 (×3): qty 1

## 2019-05-02 MED ORDER — INSULIN ASPART 100 UNIT/ML ~~LOC~~ SOLN
0.0000 [IU] | Freq: Every day | SUBCUTANEOUS | Status: DC
  Administered 2019-05-03 – 2019-05-04 (×2): 2 [IU] via SUBCUTANEOUS
  Filled 2019-05-02 (×3): qty 1

## 2019-05-02 MED ORDER — VITAMIN B-12 100 MCG PO TABS
100.0000 ug | ORAL_TABLET | Freq: Every day | ORAL | Status: DC
  Administered 2019-05-03 – 2019-05-05 (×3): 100 ug via ORAL
  Filled 2019-05-02 (×4): qty 1

## 2019-05-02 MED ORDER — FUROSEMIDE 10 MG/ML IJ SOLN
40.0000 mg | Freq: Two times a day (BID) | INTRAMUSCULAR | Status: DC
  Administered 2019-05-03 – 2019-05-05 (×5): 40 mg via INTRAVENOUS
  Filled 2019-05-02 (×5): qty 4

## 2019-05-02 MED ORDER — ACETAMINOPHEN 325 MG PO TABS
650.0000 mg | ORAL_TABLET | Freq: Once | ORAL | Status: AC
  Administered 2019-05-02: 22:00:00 650 mg via ORAL
  Filled 2019-05-02: qty 2

## 2019-05-02 MED ORDER — INSULIN ASPART 100 UNIT/ML ~~LOC~~ SOLN
0.0000 [IU] | Freq: Three times a day (TID) | SUBCUTANEOUS | Status: DC
  Administered 2019-05-03 – 2019-05-04 (×3): 3 [IU] via SUBCUTANEOUS
  Administered 2019-05-04: 08:00:00 1 [IU] via SUBCUTANEOUS
  Administered 2019-05-05: 08:00:00 2 [IU] via SUBCUTANEOUS
  Administered 2019-05-05: 7 [IU] via SUBCUTANEOUS
  Filled 2019-05-02 (×6): qty 1

## 2019-05-02 MED ORDER — SODIUM CHLORIDE 0.9% IV SOLUTION
Freq: Once | INTRAVENOUS | Status: DC
  Filled 2019-05-02: qty 250

## 2019-05-02 MED ORDER — HEPARIN (PORCINE) 25000 UT/250ML-% IV SOLN
800.0000 [IU]/h | INTRAVENOUS | Status: DC
  Administered 2019-05-02: 16:00:00 800 [IU]/h via INTRAVENOUS
  Administered 2019-05-03: 1000 [IU]/h via INTRAVENOUS
  Administered 2019-05-04: 15:00:00 950 [IU]/h via INTRAVENOUS
  Filled 2019-05-02 (×3): qty 250

## 2019-05-02 MED ORDER — INSULIN ASPART 100 UNIT/ML ~~LOC~~ SOLN: 0.0000 [IU] | Freq: Three times a day (TID) | SUBCUTANEOUS | Status: DC

## 2019-05-02 MED ORDER — ASPIRIN 81 MG PO CHEW
324.0000 mg | CHEWABLE_TABLET | Freq: Once | ORAL | Status: AC
  Administered 2019-05-02: 15:00:00 324 mg via ORAL
  Filled 2019-05-02: qty 4

## 2019-05-02 MED ORDER — LORATADINE 10 MG PO TABS: 10.0000 mg | ORAL_TABLET | Freq: Every day | ORAL | Status: DC | PRN

## 2019-05-02 MED ORDER — ACETAMINOPHEN 325 MG PO TABS
650.0000 mg | ORAL_TABLET | ORAL | Status: DC | PRN
  Administered 2019-05-03: 650 mg via ORAL
  Filled 2019-05-02: qty 2

## 2019-05-02 MED ORDER — LOSARTAN POTASSIUM 50 MG PO TABS
50.0000 mg | ORAL_TABLET | Freq: Every day | ORAL | Status: DC
  Administered 2019-05-03 – 2019-05-05 (×3): 50 mg via ORAL
  Filled 2019-05-02 (×3): qty 1

## 2019-05-02 MED ORDER — NITROGLYCERIN 0.4 MG SL SUBL: 0.4000 mg | SUBLINGUAL_TABLET | SUBLINGUAL | Status: DC | PRN

## 2019-05-02 MED ORDER — ADULT MULTIVITAMIN W/MINERALS CH
1.0000 | ORAL_TABLET | Freq: Every day | ORAL | Status: DC
  Administered 2019-05-03 – 2019-05-05 (×3): 1 via ORAL
  Filled 2019-05-02 (×3): qty 1

## 2019-05-02 MED ORDER — TAMSULOSIN HCL 0.4 MG PO CAPS
0.4000 mg | ORAL_CAPSULE | Freq: Every day | ORAL | Status: DC
  Administered 2019-05-02 – 2019-05-04 (×3): 0.4 mg via ORAL
  Filled 2019-05-02 (×3): qty 1

## 2019-05-02 MED ORDER — HEPARIN BOLUS VIA INFUSION
4000.0000 [IU] | Freq: Once | INTRAVENOUS | Status: AC
  Administered 2019-05-02: 16:00:00 4000 [IU] via INTRAVENOUS
  Filled 2019-05-02: qty 4000

## 2019-05-02 MED ORDER — INSULIN DEGLUDEC 200 UNIT/ML ~~LOC~~ SOPN: 30.0000 [IU] | PEN_INJECTOR | Freq: Every day | SUBCUTANEOUS | Status: DC

## 2019-05-02 MED ORDER — ASPIRIN EC 81 MG PO TBEC: 81.0000 mg | DELAYED_RELEASE_TABLET | Freq: Every day | ORAL | Status: DC

## 2019-05-02 MED ORDER — DIPHENHYDRAMINE HCL 25 MG PO CAPS
25.0000 mg | ORAL_CAPSULE | Freq: Once | ORAL | Status: AC
  Administered 2019-05-02: 23:00:00 25 mg via ORAL

## 2019-05-02 MED ORDER — INSULIN ASPART 100 UNIT/ML ~~LOC~~ SOLN: 0.0000 [IU] | Freq: Every day | SUBCUTANEOUS | Status: DC

## 2019-05-02 NOTE — ED Notes (Signed)
Rectal temp checked, 96.8. Hemoccult done with small amount of stool obtained on temp probe. Hemoccult POSITIVE, verified with Dr. Charna Archer. Stool appears brown in color, not red or black. Per Dr. Charna Archer, heparin drip should be started as elevated trop/NSTEMI is more urgent than GI bleed. Heparin drip started per direction. Will continue to monitor.

## 2019-05-02 NOTE — ED Triage Notes (Signed)
Pt to ED via EMS from [facility] with Respiratory distress. PT found on the ground by EMS with oxygen saturation of 70% on RA. Oxygen saturation increased to 100% on NRB but WOB did not improve. CPAP applied and EMS reports a decreased WOB. Pt unable to speak in complete sentences in ED and 86% on RA. BiPAP applied quickly after arrival. PT alert and oriented x 4. Unknown if pt has had COVID exposure.

## 2019-05-02 NOTE — Consult Note (Signed)
Name: Holly Conner MRN: [id number] DOB: [birth year]    ADMISSION DATE:  05/02/2019 CONSULTATION DATE: 05/02/2019  REFERRING MD : Dr. Stark Jock   CHIEF COMPLAINT: Shortness of Breath   BRIEF PATIENT DESCRIPTION:  78 yo female admitted with elevated troponin concerning for NSTEMI on heparin gtt and acute on chronic hypoxic respiratory failure secondary acute diastolic CHF exacerbation and possible pneumonia requiring Bipap CT Cervical Spine results concerning for possible soft tissue foreign body within the proximal trachea.  CT Chest pending for further evaluation if confirmed pt will need bronchoscopy   SIGNIFICANT EVENTS/STUDIES:  09/4-Pt admitted to the stepdown unit requiring Bipap 09/4-CT Head/Cervical Spine revealed No evidence for acute intracranial abnormality. Atrophy and small vessel disease. Loss of cervical lordosis. No evidence for acute cervical spine abnormality. Question of soft tissue foreign body within the proximal trachea. Partially imaged bilateral pleural effusion. Consider CT of the chest for further evaluation.  HISTORY OF PRESENT ILLNESS:   This is an 78 yo female with a PMH of Stroke, OSA, Polymyalgia Rheumatica, Stage IV CKD (followed by [facility]), HTN, HLD, GERD, Type II Diabetes Mellitus, Depression, Arthritis, Cor Pulmonale, TIA, Fatty Liver, and Hyperparathyroidism.  She presented to [facility] ER on 09/4 via EMS from [facility] with respiratory distress.  Per ER notes EMS reported pts O2 sats upon their arrival were 70% on RA and pt with increased work of breathing.  Pt  placed on NRB, however she remained tachypneic requiring CPAP.  Upon arrival to the ER pt in severe respiratory distress and unable to speak in complete sentences requiring Bipap.  Pt also hypothermic temp 96.8 F. CXR concerning for pulmonary edema and atelectasis vs. pneumonia, COVID-19 negative.  Lab results revealed CO2 18, glucose 331, BUN 38, creatinine 2.97, BNP 1,860, troponin 923, hgb  7.1, and stool occult positive.  EKG showed nsr, left BBB, ST depression in V5 and V6.  Pt received aspirin, 40 mg iv lasix, and heparin gtt initiated due to concern of NSTEMI.  CT Head negative for acute intracranial abnormality, however concerning for possible soft tissue foreign body within the proximal trachea, CT Chest ordered for further evaluation.  Pt subsequently admitted to the stepdown unit by hospitalist team for additional workup and treatment.   PAST MEDICAL HISTORY :   has a past medical history of Arthritis, Depression, Diabetes mellitus without complication ([place]), GERD (gastroesophageal reflux disease), Hepatitis, HLD (hyperlipidemia), Hypertension, Kidney failure, Polymyalgia rheumatica ([place]), Sleep apnea, and Stroke ([place]).  has a past surgical history that includes Replacement total knee (Right); Tonsillectomy and adenoidectomy; Abdominal hysterectomy; Bilateral salpingoophorectomy; Esophageal dilation; Oophorectomy; Reduction mammaplasty (Bilateral, 2003); and TEE without cardioversion (N/A, 03/31/2019). Prior to Admission medications   Medication Sig Start Date End Date Taking? Authorizing Provider  amLODipine (NORVASC) 10 MG tablet Take 1 tablet (10 mg total) by mouth daily. 04/01/19  Yes Vaughan Basta, MD  Ascorbic Acid (VITAMIN C) 1000 MG tablet Take 1,000 mg by mouth daily.   Yes [provider]  aspirin EC 81 MG tablet Take 1 tablet (81 mg total) by mouth daily. 04/01/19 03/31/20 Yes Vaughan Basta, MD  atorvastatin (LIPITOR) 80 MG tablet Take 80 mg by mouth daily.    Yes [provider]  insulin aspart (NOVOLOG FLEXPEN) 100 UNIT/ML FlexPen Inject 0-12 Units into the skin See admin instructions. Inject under the skin three times daily with meals according to sliding scale:   200 or below: 0u 201-250: 2u 251-300: 4u 301-350: 6u 351-400: 8u 401-450: 10u 450 or higher:  12u and contact physician   Yes [provider]  Insulin Degludec  (TRESIBA FLEXTOUCH) 200 UNIT/ML SOPN Inject 30 Units into the skin daily. 04/01/19  Yes Vaughan Basta, MD  liraglutide (VICTOZA) 18 MG/3ML SOPN Inject 1.8 mg into the skin daily.    Yes [provider]  loratadine (CLARITIN) 10 MG tablet Take 10 mg by mouth daily as needed for allergies.   Yes [provider]  losartan (COZAAR) 50 MG tablet Take 50 mg by mouth daily.   Yes [provider]  Multiple Vitamin (MULTIVITAMIN WITH MINERALS) TABS tablet Take 1 tablet by mouth daily. 04/01/19  Yes Vaughan Basta, MD  pyridOXINE (VITAMIN B-6) 100 MG tablet Take 100 mg by mouth daily.   Yes [provider]  solifenacin (VESICARE) 10 MG tablet Take 1 tablet (10 mg total) by mouth daily. 10/11/18  Yes Stoioff, Ronda Fairly, MD  tamsulosin (FLOMAX) 0.4 MG CAPS capsule Take 1 capsule (0.4 mg total) by mouth at bedtime. 04/01/19  Yes Vaughan Basta, MD  vitamin B-12 (CYANOCOBALAMIN) 100 MCG tablet Take 100 mcg by mouth daily.    Yes [provider]  QUEtiapine (SEROQUEL) 25 MG tablet Take 0.5 tablets (12.5 mg total) by mouth daily. Patient not taking: Reported on 05/02/2019 04/08/19   Henreitta Leber, MD  QUEtiapine (SEROQUEL) 25 MG tablet Take 1 tablet (25 mg total) by mouth at bedtime. Patient not taking: Reported on 05/02/2019 04/07/19   Henreitta Leber, MD   Allergies  Allergen Reactions   Iodinated Diagnostic Agents Shortness Of Breath    Only with nuclear stress test per pt Only with nuclear stress test per pt    Penicillins Rash    Rash at injection site. Pt states she can take Amoxicillin & Keflex    Escitalopram    Lithium    Erythromycin Nausea Only and Nausea And Vomiting    Other reaction(s): Other (See Comments) dehydration     FAMILY HISTORY:  family history includes Breast cancer in her cousin; CAD in her brother; Kidney cancer in her mother. SOCIAL HISTORY:  reports that she has never smoked. She has never used smokeless  tobacco. She reports that she does not drink alcohol or use drugs.  REVIEW OF SYSTEMS: Positives in BOLD  Constitutional: Negative for fever, chills, weight loss, malaise/fatigue and diaphoresis.  HENT: Negative for hearing loss, ear pain, nosebleeds, congestion, sore throat, neck pain, tinnitus and ear discharge.   Eyes: Negative for blurred vision, double vision, photophobia, pain, discharge and redness.  Respiratory: Negative for cough, hemoptysis, sputum production, shortness of breath, wheezing and stridor.   Cardiovascular: Negative for chest pain, palpitations, orthopnea, claudication, leg swelling and PND.  Gastrointestinal: Negative for heartburn, nausea, vomiting, abdominal pain, diarrhea, constipation, blood in stool and melena.  Genitourinary: Negative for dysuria, urgency, frequency, hematuria and flank pain.  Musculoskeletal: Negative for myalgias, back pain, joint pain and falls.  Skin: Negative for itching and rash.  Neurological: Negative for dizziness, tingling, tremors, sensory change, speech change, focal weakness, seizures, loss of consciousness, weakness and headaches.  Endo/Heme/Allergies: Negative for environmental allergies and polydipsia. Does not bruise/bleed easily.  SUBJECTIVE:   VITAL SIGNS: Temp:  [96.8 F (36 C)] 96.8 F (36 C) (09/04 1530) Pulse Rate:  [73-84] 79 (09/04 1500) Resp:  [15-21] 17 (09/04 1500) BP: (103-127)/(43-87) 103/87 (09/04 1500) SpO2:  [93 %-100 %] 99 % (09/04 1500) Weight:  [85.1 kg] 85.1 kg (09/04 1229)  PHYSICAL EXAMINATION: General: No acute distress Neuro: Alert and  oriented x3, no focal deficits HEENT: PERRLA, trachea midline, no JVD, oral mucosa moist Cardiovascular: Apical pulse regular, S1-S2, no murmur regurg or gallop, +2 pulses bilaterally, no edema Lungs: Normal work of breathing, bilateral breath sounds, diminished in the bases with bibasilar Rales and fine crackles, no wheezing Abdomen: Nondistended, normal bowel  sounds in all 4 quadrants, palpation reveals no organomegaly Musculoskeletal: Positive range of motion, no joint deformities Skin: Warm and dry  Recent Labs  Lab 05/02/19 1232  NA 136  K 4.5  CL 104  CO2 18*  BUN 38*  CREATININE 2.97*  GLUCOSE 331*   Recent Labs  Lab 05/02/19 1232  HGB 7.1*  HCT 22.4*  WBC 10.2  PLT 487*   Ct Head Wo Contrast  Result Date: 05/02/2019 CLINICAL DATA:  from [facility] with Respiratory distress. PT found on the ground by EMS with oxygen saturation of 70% on RA. Oxygen saturation increased to 100% on NRB but WOB did not improve. EXAM: CT HEAD WITHOUT CONTRAST CT CERVICAL SPINE WITHOUT CONTRAST TECHNIQUE: Multidetector CT imaging of the head and cervical spine was performed following the standard protocol without intravenous contrast. Multiplanar CT image reconstructions of the cervical spine were also generated. COMPARISON:  04/03/2019 FINDINGS: CT HEAD FINDINGS Brain: There is central and cortical atrophy. Periventricular white matter changes are consistent with small vessel disease. There is no intra or extra-axial fluid collection or mass lesion. The basilar cisterns and ventricles have a normal appearance. There is no CT evidence for acute infarction or hemorrhage. Vascular: There is minimal atheromatous sclerotic calcification of the internal carotid arteries. No hyperdense vessels. Skull: Normal. Negative for fracture or focal lesion. Sinuses/Orbits: No acute finding. Other: None CT CERVICAL SPINE FINDINGS Alignment: There is loss of cervical lordosis. This may be secondary to splinting, soft tissue injury, or positioning. Otherwise alignment is normal. Skull base and vertebrae: No acute fracture. No primary bone lesion or focal pathologic process. Soft tissues and spinal canal: No prevertebral fluid or swelling. No visible canal hematoma. Disc levels: Disc height loss and uncovertebral spurring in the mid cervical spine. Upper chest: Partially  imaged bilateral pleural effusion. There is question of soft tissue foreign body within the proximal trachea. Other: There is atherosclerotic calcification of the carotid arteries. IMPRESSION: 1. No evidence for acute intracranial abnormality. 2. Atrophy and small vessel disease. 3. Loss of cervical lordosis. 4. No evidence for acute cervical spine abnormality. 5. Question of soft tissue foreign body within the proximal trachea. 6. Partially imaged bilateral pleural effusion. 7. Consider CT of the chest for further evaluation. These results were called by telephone at the time of interpretation on 05/02/2019 at 2:55 pm to Dr. Blake Divine , who verbally acknowledged these results. Electronically Signed   By: Nolon Nations M.D.   On: 05/02/2019 14:58   Ct Cervical Spine Wo Contrast  Result Date: 05/02/2019 CLINICAL DATA:  from [facility] with Respiratory distress. PT found on the ground by EMS with oxygen saturation of 70% on RA. Oxygen saturation increased to 100% on NRB but WOB did not improve. EXAM: CT HEAD WITHOUT CONTRAST CT CERVICAL SPINE WITHOUT CONTRAST TECHNIQUE: Multidetector CT imaging of the head and cervical spine was performed following the standard protocol without intravenous contrast. Multiplanar CT image reconstructions of the cervical spine were also generated. COMPARISON:  04/03/2019 FINDINGS: CT HEAD FINDINGS Brain: There is central and cortical atrophy. Periventricular white matter changes are consistent with small vessel disease. There is no intra or extra-axial fluid  collection or mass lesion. The basilar cisterns and ventricles have a normal appearance. There is no CT evidence for acute infarction or hemorrhage. Vascular: There is minimal atheromatous sclerotic calcification of the internal carotid arteries. No hyperdense vessels. Skull: Normal. Negative for fracture or focal lesion. Sinuses/Orbits: No acute finding. Other: None CT CERVICAL SPINE FINDINGS Alignment: There is  loss of cervical lordosis. This may be secondary to splinting, soft tissue injury, or positioning. Otherwise alignment is normal. Skull base and vertebrae: No acute fracture. No primary bone lesion or focal pathologic process. Soft tissues and spinal canal: No prevertebral fluid or swelling. No visible canal hematoma. Disc levels: Disc height loss and uncovertebral spurring in the mid cervical spine. Upper chest: Partially imaged bilateral pleural effusion. There is question of soft tissue foreign body within the proximal trachea. Other: There is atherosclerotic calcification of the carotid arteries. IMPRESSION: 1. No evidence for acute intracranial abnormality. 2. Atrophy and small vessel disease. 3. Loss of cervical lordosis. 4. No evidence for acute cervical spine abnormality. 5. Question of soft tissue foreign body within the proximal trachea. 6. Partially imaged bilateral pleural effusion. 7. Consider CT of the chest for further evaluation. These results were called by telephone at the time of interpretation on 05/02/2019 at 2:55 pm to Dr. Blake Divine , who verbally acknowledged these results. Electronically Signed   By: Nolon Nations M.D.   On: 05/02/2019 14:58   Dg Chest Portable 1 View  Result Date: 05/02/2019 CLINICAL DATA:  Respiratory distress. Hypoxia. EXAM: PORTABLE CHEST 1 VIEW COMPARISON:  04/03/2019. FINDINGS: Stable enlargement of the cardiac silhouette. Increased prominence of the pulmonary vasculature and interstitial markings. Interval patchy opacities at both lung bases and in the left mid and upper lung zones. Minimal patchy opacity in the right upper lung zone. No definite pleural fluid seen. Mild thoracic spine degenerative changes. IMPRESSION: 1. Interval patchy atelectasis, alveolar edema or pneumonia in both lungs. 2. Stable cardiomegaly with interval changes of congestive heart failure. Electronically Signed   By: Claudie Revering M.D.   On: 05/02/2019 13:26    ASSESSMENT /  PLAN:  Acute on chronic hypoxic respiratory failure secondary to acute diastolic CHF exacerbation and possible pneumonia  CT Cervical Spine results: possible soft tissue foreign body within trachea Supplemental O2 for dyspnea and/or hypoxia  Bipap qhs and prn  Scheduled iv lasix  Prn bronchodilatory therapy  CT Chest pending to evaluate possible soft tissue foreign body within the proximal trachea found on CT Cervical Spine results-if findings confirmed pt will require bronchoscopy  Trend WBC and monitor fever curve  Will start cefepime   Acute on chronic renal failure  Trend BMP  Replace electrolytes as indicated Monitor UOP  Avoid nephrotoxic medications   NSTEMI  Hx: Stroke, HLD, and HTN Continuous telemetry monitoring  Trend troponin's  Cardiology consulted appreciate input-heparin gtt per recommendations  Continue outpatient atorvastatin and losartin  Echo pending   Anemia-guiac positive  Transfused 1 unit of packed red blood cells; repeat hemoglobin up to 8.1 from 6.7 Trend CBC  Monitor for s/sx of bleeding and transfuse for hgb <7 Iron studies, folate level, and vitamin B12 pending  If pt shows signs of bleeding or hgb <7 will stop heparin gtt   Uncontrolled Type II Diabetes Mellitus  CBG's ac/hs SSI and scheduled lantus   Best Practice: Code Status:  Full. Diet: Clear liquid diet GI prophylaxis:  PPI. VTE prophylaxis:  SCD's / heparin.  Gracin Mcpartland S. Tukov ANP-BC Pulmonary and Critical Care Medicine  [employer] Pager [phone] or [phone]  NB: This document was prepared using Dragon voice recognition software and may include unintentional dictation errors.

## 2019-05-02 NOTE — ED Notes (Signed)
When arriving to pt room she had removed her IV from her right [facility]. Upon inspection there are no injuries. Had fall risk socks in place which were removed by her.

## 2019-05-02 NOTE — ED Notes (Signed)
Reapplied nonslip socks

## 2019-05-02 NOTE — ED Notes (Signed)
Date and time results received: 05/02/19 1322 (use smartphrase ".now" to insert current time)  Test: Troponin Critical Value: 923  Name of Provider Notified: [place]  Orders Received? Or Actions Taken?: no new orders at this time

## 2019-05-02 NOTE — ED Notes (Signed)
Multiple warm blankets placed on pt, wrapped over head.

## 2019-05-02 NOTE — H&P (Addendum)
[facility] NAME: Holly Conner    MR#:  [id number]  DATE OF BIRTH:  March 05, 1941  DATE OF ADMISSION:  05/02/2019  PRIMARY CARE PHYSICIAN: Dion Body, MD   REQUESTING/REFERRING PHYSICIAN: Blake Divine  CHIEF COMPLAINT:   Chief Complaint  Patient presents with   Respiratory Distress    HISTORY OF PRESENT ILLNESS:  Holly Conner  is a 78 y.o. female with a known history of CAD,Hypertension, diabetes mellitus chronic kidney disease stage IV and depression who was sent from nursing home to the emergency room with complaints of shortness of breath that have been ongoing since yesterday.  Denied any chest pain.  Patient was reported to have been hypoxic with oxygen saturation in the 70s.  Was already placed on BiPAP in the emergency room and oxygen saturation improved to the 90s.  Was evaluated in the emergency room and found to have elevated troponin of 923.  Twelve-lead EKG done showed normal sinus rhythm with old left bundle branch block.  B-type natriuretic peptide of 1860.  COVID test done was negative.  Chest x-ray done revealed interval patchy atelectasis, alveolar edema or pneumonia in both lungs.  Stable cardiomegaly with interval changes of CHF.  Patient was also noted to be hypothermic with temperature of 96.8.  Patient noted to have had a CT done in the emergency room with no acute intracranial finding.  There was also question of soft tissue foreign body within the proximal trachea and partially imaged bilateral pleural effusion.  CT chest recommended for further evaluation.  Patient currently stable on BiPAP.  Denied having any difficulty swallowing.  Denied any aspiration at at the nursing home.  Emergency room provider discussed case with cardiologist Dr. Clayborn Bigness already and patient already started on heparin drip.  Medical service called to admit patient for further evaluation and management.  PAST MEDICAL  HISTORY:   Past Medical History:  Diagnosis Date   Arthritis    Depression    controlled   Diabetes mellitus without complication ([place])    managed well;    GERD (gastroesophageal reflux disease)    Hepatitis    HLD (hyperlipidemia)    Hypertension    somewhat controlled   Kidney failure    4th stage; but bloodwork is stable;    Polymyalgia rheumatica (HCC)    Sleep apnea    Stroke [facility])     PAST SURGICAL HISTORY:   Past Surgical History:  Procedure Laterality Date   ABDOMINAL HYSTERECTOMY     BILATERAL SALPINGOOPHORECTOMY     ESOPHAGEAL DILATION     OOPHORECTOMY     REDUCTION MAMMAPLASTY Bilateral 2003   REPLACEMENT TOTAL KNEE Right    TEE WITHOUT CARDIOVERSION N/A 03/31/2019   Procedure: TRANSESOPHAGEAL ECHOCARDIOGRAM (TEE);  Surgeon: Corey Skains, MD;  Location: [facility] ORS;  Service: Cardiovascular;  Laterality: N/A;   TONSILLECTOMY AND ADENOIDECTOMY      SOCIAL HISTORY:   Social History   Tobacco Use   Smoking status: Never Smoker   Smokeless tobacco: Never Used  Substance Use Topics   Alcohol use: No    Alcohol/week: 0.0 standard drinks    FAMILY HISTORY:   Family History  Problem Relation Age of Onset   CAD Brother    Kidney cancer Mother    Breast cancer Cousin     DRUG ALLERGIES:   Allergies  Allergen Reactions   Iodinated Diagnostic Agents Shortness Of Breath    Only with nuclear stress test  per pt Only with nuclear stress test per pt    Penicillins Rash    Rash at injection site. Pt states she can take Amoxicillin & Keflex    Escitalopram    Lithium    Erythromycin Nausea Only and Nausea And Vomiting    Other reaction(s): Other (See Comments) dehydration     REVIEW OF SYSTEMS:   Review of Systems  Constitutional: Negative for chills and fever.  HENT: Negative for hearing loss and tinnitus.   Eyes: Negative for blurred vision.  Respiratory: Positive for shortness of breath. Negative for cough and  wheezing.   Cardiovascular: Negative for chest pain and palpitations.  Gastrointestinal: Negative for heartburn.  Genitourinary: Negative for dysuria.  Musculoskeletal: Negative for myalgias and neck pain.  Skin: Negative for itching and rash.  Neurological: Negative for dizziness and headaches.  Psychiatric/Behavioral: Negative for depression and hallucinations.    MEDICATIONS AT HOME:   Prior to Admission medications   Medication Sig Start Date End Date Taking? Authorizing Provider  amLODipine (NORVASC) 10 MG tablet Take 1 tablet (10 mg total) by mouth daily. 04/01/19  Yes Vaughan Basta, MD  Ascorbic Acid (VITAMIN C) 1000 MG tablet Take 1,000 mg by mouth daily.   Yes [provider]  aspirin EC 81 MG tablet Take 1 tablet (81 mg total) by mouth daily. 04/01/19 03/31/20 Yes Vaughan Basta, MD  atorvastatin (LIPITOR) 80 MG tablet Take 80 mg by mouth daily.    Yes [provider]  insulin aspart (NOVOLOG FLEXPEN) 100 UNIT/ML FlexPen Inject 0-12 Units into the skin See admin instructions. Inject under the skin three times daily with meals according to sliding scale:   200 or below: 0u 201-250: 2u 251-300: 4u 301-350: 6u 351-400: 8u 401-450: 10u 450 or higher: 12u and contact physician   Yes [provider]  Insulin Degludec (TRESIBA FLEXTOUCH) 200 UNIT/ML SOPN Inject 30 Units into the skin daily. 04/01/19  Yes Vaughan Basta, MD  liraglutide (VICTOZA) 18 MG/3ML SOPN Inject 1.8 mg into the skin daily.    Yes [provider]  loratadine (CLARITIN) 10 MG tablet Take 10 mg by mouth daily as needed for allergies.   Yes [provider]  losartan (COZAAR) 50 MG tablet Take 50 mg by mouth daily.   Yes [provider]  Multiple Vitamin (MULTIVITAMIN WITH MINERALS) TABS tablet Take 1 tablet by mouth daily. 04/01/19  Yes Vaughan Basta, MD  pyridOXINE (VITAMIN B-6) 100 MG tablet Take 100 mg by mouth daily.   Yes [provider]  solifenacin (VESICARE) 10 MG tablet Take 1 tablet (10 mg total) by mouth daily. 10/11/18  Yes Stoioff, Ronda Fairly, MD  tamsulosin (FLOMAX) 0.4 MG CAPS capsule Take 1 capsule (0.4 mg total) by mouth at bedtime. 04/01/19  Yes Vaughan Basta, MD  vitamin B-12 (CYANOCOBALAMIN) 100 MCG tablet Take 100 mcg by mouth daily.    Yes [provider]  QUEtiapine (SEROQUEL) 25 MG tablet Take 0.5 tablets (12.5 mg total) by mouth daily. Patient not taking: Reported on 05/02/2019 04/08/19   Henreitta Leber, MD  QUEtiapine (SEROQUEL) 25 MG tablet Take 1 tablet (25 mg total) by mouth at bedtime. Patient not taking: Reported on 05/02/2019 04/07/19   Henreitta Leber, MD      VITAL SIGNS:  Blood pressure 103/87, pulse 79, temperature (S) (!) 96.8 F (36 C), temperature source Rectal, resp. rate 17, weight 85.1 kg, SpO2 99 %.  PHYSICAL EXAMINATION:  Physical Exam  GENERAL:  78 y.o.-year-old  patient lying in the bed with no acute distress.  Patient currently on BiPAP EYES: Pupils equal, round, reactive to light and accommodation. No scleral icterus. Extraocular muscles intact.  HEENT: Head atraumatic, normocephalic. Oropharynx and nasopharynx clear.  NECK:  Supple, no jugular venous distention. No thyroid enlargement, no tenderness.  LUNGS: Mild rales bilaterally.  No wheezing.. No use of accessory muscles of respiration.  CARDIOVASCULAR: S1, S2 normal. No murmurs, rubs, or gallops.  ABDOMEN: Soft, nontender, nondistended. Bowel sounds present. No organomegaly or mass.  EXTREMITIES: No pedal edema, cyanosis, or clubbing.  NEUROLOGIC: Generalized weakness with no focal deficit  Gait not checked.  PSYCHIATRIC: The patient is alert and oriented x 3.  SKIN: No obvious rash, lesion, or ulcer.   LABORATORY PANEL:   CBC Recent Labs  Lab 05/02/19 1232  WBC 10.2  HGB 7.1*  HCT 22.4*  PLT 487*    ------------------------------------------------------------------------------------------------------------------  Chemistries  Recent Labs  Lab 05/02/19 1232  NA 136  K 4.5  CL 104  CO2 18*  GLUCOSE 331*  BUN 38*  CREATININE 2.97*  CALCIUM 8.6*   ------------------------------------------------------------------------------------------------------------------  Cardiac Enzymes No results for input(s): TROPONINI in the last 168 hours. ------------------------------------------------------------------------------------------------------------------  RADIOLOGY:  Ct Head Wo Contrast  Result Date: 05/02/2019 CLINICAL DATA:  from [facility] with Respiratory distress. PT found on the ground by EMS with oxygen saturation of 70% on RA. Oxygen saturation increased to 100% on NRB but WOB did not improve. EXAM: CT HEAD WITHOUT CONTRAST CT CERVICAL SPINE WITHOUT CONTRAST TECHNIQUE: Multidetector CT imaging of the head and cervical spine was performed following the standard protocol without intravenous contrast. Multiplanar CT image reconstructions of the cervical spine were also generated. COMPARISON:  04/03/2019 FINDINGS: CT HEAD FINDINGS Brain: There is central and cortical atrophy. Periventricular white matter changes are consistent with small vessel disease. There is no intra or extra-axial fluid collection or mass lesion. The basilar cisterns and ventricles have a normal appearance. There is no CT evidence for acute infarction or hemorrhage. Vascular: There is minimal atheromatous sclerotic calcification of the internal carotid arteries. No hyperdense vessels. Skull: Normal. Negative for fracture or focal lesion. Sinuses/Orbits: No acute finding. Other: None CT CERVICAL SPINE FINDINGS Alignment: There is loss of cervical lordosis. This may be secondary to splinting, soft tissue injury, or positioning. Otherwise alignment is normal. Skull base and vertebrae: No acute fracture. No primary  bone lesion or focal pathologic process. Soft tissues and spinal canal: No prevertebral fluid or swelling. No visible canal hematoma. Disc levels: Disc height loss and uncovertebral spurring in the mid cervical spine. Upper chest: Partially imaged bilateral pleural effusion. There is question of soft tissue foreign body within the proximal trachea. Other: There is atherosclerotic calcification of the carotid arteries. IMPRESSION: 1. No evidence for acute intracranial abnormality. 2. Atrophy and small vessel disease. 3. Loss of cervical lordosis. 4. No evidence for acute cervical spine abnormality. 5. Question of soft tissue foreign body within the proximal trachea. 6. Partially imaged bilateral pleural effusion. 7. Consider CT of the chest for further evaluation. These results were called by telephone at the time of interpretation on 05/02/2019 at 2:55 pm to Dr. Blake Divine , who verbally acknowledged these results. Electronically Signed   By: Nolon Nations M.D.   On: 05/02/2019 14:58   Ct Cervical Spine Wo Contrast  Result Date: 05/02/2019 CLINICAL DATA:  from [facility] with Respiratory distress. PT found on the ground by EMS with oxygen saturation of 70% on RA.  Oxygen saturation increased to 100% on NRB but WOB did not improve. EXAM: CT HEAD WITHOUT CONTRAST CT CERVICAL SPINE WITHOUT CONTRAST TECHNIQUE: Multidetector CT imaging of the head and cervical spine was performed following the standard protocol without intravenous contrast. Multiplanar CT image reconstructions of the cervical spine were also generated. COMPARISON:  04/03/2019 FINDINGS: CT HEAD FINDINGS Brain: There is central and cortical atrophy. Periventricular white matter changes are consistent with small vessel disease. There is no intra or extra-axial fluid collection or mass lesion. The basilar cisterns and ventricles have a normal appearance. There is no CT evidence for acute infarction or hemorrhage. Vascular: There is minimal  atheromatous sclerotic calcification of the internal carotid arteries. No hyperdense vessels. Skull: Normal. Negative for fracture or focal lesion. Sinuses/Orbits: No acute finding. Other: None CT CERVICAL SPINE FINDINGS Alignment: There is loss of cervical lordosis. This may be secondary to splinting, soft tissue injury, or positioning. Otherwise alignment is normal. Skull base and vertebrae: No acute fracture. No primary bone lesion or focal pathologic process. Soft tissues and spinal canal: No prevertebral fluid or swelling. No visible canal hematoma. Disc levels: Disc height loss and uncovertebral spurring in the mid cervical spine. Upper chest: Partially imaged bilateral pleural effusion. There is question of soft tissue foreign body within the proximal trachea. Other: There is atherosclerotic calcification of the carotid arteries. IMPRESSION: 1. No evidence for acute intracranial abnormality. 2. Atrophy and small vessel disease. 3. Loss of cervical lordosis. 4. No evidence for acute cervical spine abnormality. 5. Question of soft tissue foreign body within the proximal trachea. 6. Partially imaged bilateral pleural effusion. 7. Consider CT of the chest for further evaluation. These results were called by telephone at the time of interpretation on 05/02/2019 at 2:55 pm to Dr. Blake Divine , who verbally acknowledged these results. Electronically Signed   By: Nolon Nations M.D.   On: 05/02/2019 14:58   Dg Chest Portable 1 View  Result Date: 05/02/2019 CLINICAL DATA:  Respiratory distress. Hypoxia. EXAM: PORTABLE CHEST 1 VIEW COMPARISON:  04/03/2019. FINDINGS: Stable enlargement of the cardiac silhouette. Increased prominence of the pulmonary vasculature and interstitial markings. Interval patchy opacities at both lung bases and in the left mid and upper lung zones. Minimal patchy opacity in the right upper lung zone. No definite pleural fluid seen. Mild thoracic spine degenerative changes. IMPRESSION: 1.  Interval patchy atelectasis, alveolar edema or pneumonia in both lungs. 2. Stable cardiomegaly with interval changes of congestive heart failure. Electronically Signed   By: Claudie Revering M.D.   On: 05/02/2019 13:26      IMPRESSION AND PLAN:  Patient is an 78 year old female with history of diabetes mellitus, chronic kidney disease stage IV and CAD who presented to the emergency room with shortness of breath and found to have non-ST MI elevation.  Already started on heparin drip.  1.  Acute hypoxic respiratory failure Likely due to NSTEMI and pleural effusion Patient presented with oxygen saturation in the 70s on room air.  Now improved in the 90s with BiPAP. Being admitted to ICU.  2.  Non-ST MI elevation Patient presented with shortness of breath and an elevated troponin of greater than 900. Emergency room provider discussed case with cardiologist Dr. Clayborn Bigness and patient was started on heparin drip. I have placed consult and sent a message to Dr. Clayborn Bigness as well. Continue heparin drip. 2D echocardiogram to evaluate cardiac function  3.  Anemia Noted recent drop in hemoglobin to 7.1. Clinically no evidence of any bleeding.  Requested for Hemoccult test.  Iron studies and B12 level. Follow-up CBC in a.m.  If hemoglobin less than 7 4 packed red blood cell transfusion  4.  Question of incidental finding of possible foreign body in the trachea as well as possible pleural effusion on CT head Patient denied having any difficulty swallowing or having aspiration. Requested for CT chest as ordered. I discussed case with critical care physician Dr. Mortimer Fries.  Depending on findings on CT chest decision will be made if bronchoscopy is indicated.  5.  Diabetes mellitus type 2 Resume insulin.  Placed on sliding scale insulin.  Glycosylated hemoglobin level in a.m.  6.  Chronic kidney disease stage IV Monitor renal function.  To consider nephrology consult if worsening of renal function in  a.m.  7.  Mild hypothermia.  Temperature of 96.8. Patient has multiple blankets on.  Follow-up on repeat temperature  DVT prophylaxis; patient already on heparin drip  All the records are reviewed and case discussed with ED provider. Management plans discussed with the patient, and she is in agreement. I attempted calling patient's contact listed in the chart Ms. Rose, Rachael but no response.  CODE STATUS: Full code  TOTAL TIME TAKING CARE OF THIS PATIENT: 65 minutes.    Tramar Brueckner M.D on 05/02/2019 at 5:15 PM  Between 7am to 6pm - Pager - [phone]  After 6pm go to [email or web address] - Proofreader  [employer] [facility]  Office  [phone]  CC: Primary care physician; Dion Body, MD   Note: This dictation was prepared with Dragon dictation along with smaller phrase technology. Any transcriptional errors that result from this process are unintentional.

## 2019-05-02 NOTE — Consult Note (Signed)
ANTICOAGULATION CONSULT NOTE - Initial Consult  Pharmacy Consult for Heparin Infusion Dosing and monitoring  Indication: chest pain/ACS  Allergies  Allergen Reactions  . Iodinated Diagnostic Agents Shortness Of Breath    Only with nuclear stress test per pt Only with nuclear stress test per pt   . Penicillins Rash    Rash at injection site. Pt states she can take Amoxicillin & Keflex   . Escitalopram   . Lithium   . Erythromycin Nausea Only and Nausea And Vomiting    Other reaction(s): Other (See Comments) dehydration     Patient Measurements: Weight: 187 lb 9.8 oz (85.1 kg) Heparin Dosing Weight: 68 Kg  Vital Signs: BP: 106/60 (09/04 1300) Pulse Rate: 84 (09/04 1300)  Labs: Recent Labs    05/02/19 1232  HGB 7.1*  HCT 22.4*  PLT 487*  CREATININE 2.97*  TROPONINIHS 923*    Estimated Creatinine Clearance: 15 mL/min (A) (by C-G formula based on SCr of 2.97 mg/dL (H)).   Medical History: Past Medical History:  Diagnosis Date  . Arthritis   . Depression    controlled  . Diabetes mellitus without complication ([place])    managed well;   Marland Kitchen GERD (gastroesophageal reflux disease)   . Hepatitis   . HLD (hyperlipidemia)   . Hypertension    somewhat controlled  . 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 Polymyalgia rheumatica ([place])   . Sleep apnea   . Stroke [facility])     Assessment: Pharmacy consulted for heparin infusion dosing and monitoring in 78 yo female admitted with respiratory distress and elevated troponin.  CrCl ~15 mL/min   Hgb: 7.1    HCT: 22.4 . No anticoagulants prior to admission.   Troponin: 923   Goal of Therapy:  Heparin level 0.3-0.7 units/ml Monitor platelets by anticoagulation protocol: Yes   Plan:  MD aware of Hgb 7.1  4000 units bolus x 1 Start heparin infusion at 800 units/hr Check anti-Xa level in 8 hours and daily while on heparin Continue to monitor H&H and platelets  Pernell Dupre, PharmD, BCPS Clinical  Pharmacist 05/02/2019 2:47 PM

## 2019-05-02 NOTE — Progress Notes (Signed)
Care Alignment Note  Advanced Directives Documents (Living Will, Power of Attorney) currently in the EHR no advanced directives documents available .  Has the patient discussed their wishes with their family/healthcare power of attorney no.  What does the patient/decision maker understand about their medical condition and the natural course of their disease.   Patient presented with acute hypoxic respiratory failure.  Non-ST MI elevation.  History of diabetes mellitus.  Anemia.  What is the patient/decision maker's biggest fear or concern for the future pain and suffering   What is the most important goal for this patient should their health condition worsen maintenance of function.  Current   Code Status: Full Code  Current code status has been reviewed/updated.  Time spent:18 minutes

## 2019-05-02 NOTE — ED Provider Notes (Signed)
[facility] Emergency Department Provider Note   ____________________________________________   First MD Initiated Contact with Patient 05/02/19 1223     (approximate)  I have reviewed the triage vital signs and the nursing notes.   HISTORY  Chief Complaint Respiratory Distress    HPI Holly Conner is a 78 y.o. female with past medical history of CAD, hypertension, diabetes, CKD who presents to the ED with complaints of shortness of breath.  Patient has been noted to be increasingly short of breath over the past 24 hours by staff at [facility], where patient resides.  She has not had any fevers, cough, or chest pain.  Staff went to the other room to call EMS, and when they returned back to patient's room, she was found on the ground.  She was initially noted to have an O2 sat of 70% on room air, was placed on nonrebreather by EMS with improvement to 100%, however patient continued to have increased work of breathing.  She was subsequently transitioned to CPAP by EMS.  On arrival, patient denies any chest pain currently or prior.  She has not noticed any pain or swelling in her legs.  She denies any history of lung problems.        Past Medical History:  Diagnosis Date  . Arthritis   . Depression    controlled  . Diabetes mellitus without complication ([place])    managed well;   Marland Kitchen GERD (gastroesophageal reflux disease)   . Hepatitis   . HLD (hyperlipidemia)   . Hypertension    somewhat controlled  . 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 Polymyalgia rheumatica ([place])   . Sleep apnea   . Stroke [facility]     Patient Active Problem List   Diagnosis Date Noted  . NSTEMI (non-ST elevated myocardial infarction) ([place]) 05/02/2019  . Memory changes 04/05/2019  . Unresponsive 04/04/2019  . Unresponsive episode 04/03/2019  . Hypoglycemia 04/03/2019  . Elevated troponin 04/03/2019  . Weakness generalized 03/28/2019  .  Hypertensive urgency 12/18/2018  . Stroke ([place]) 12/19/2015  . Type 2 diabetes mellitus ([place]) 12/19/2015  . HTN (hypertension) 12/19/2015  . GERD (gastroesophageal reflux disease) 12/19/2015  . HLD (hyperlipidemia) 12/19/2015  . CKD (chronic kidney disease), stage IV ([place]) 12/19/2015    Past Surgical History:  Procedure Laterality Date  . ABDOMINAL HYSTERECTOMY    . BILATERAL SALPINGOOPHORECTOMY    . ESOPHAGEAL DILATION    . OOPHORECTOMY    . REDUCTION MAMMAPLASTY Bilateral 2003  . REPLACEMENT TOTAL KNEE Right   . TEE WITHOUT CARDIOVERSION N/A 03/31/2019   Procedure: TRANSESOPHAGEAL ECHOCARDIOGRAM (TEE);  Surgeon: Corey Skains, MD;  Location: [facility] ORS;  Service: Cardiovascular;  Laterality: N/A;  . TONSILLECTOMY AND ADENOIDECTOMY      Prior to Admission medications   Medication Sig Start Date End Date Taking? Authorizing Provider  amLODipine (NORVASC) 10 MG tablet Take 1 tablet (10 mg total) by mouth daily. 04/01/19  Yes Vaughan Basta, MD  Ascorbic Acid (VITAMIN C) 1000 MG tablet Take 1,000 mg by mouth daily.   Yes [provider]  aspirin EC 81 MG tablet Take 1 tablet (81 mg total) by mouth daily. 04/01/19 03/31/20 Yes Vaughan Basta, MD  atorvastatin (LIPITOR) 80 MG tablet Take 80 mg by mouth daily.    Yes [provider]  insulin aspart (NOVOLOG FLEXPEN) 100 UNIT/ML FlexPen Inject 0-12 Units into the skin See admin instructions. Inject under the skin three  times daily with meals according to sliding scale:   200 or below: 0u 201-250: 2u 251-300: 4u 301-350: 6u 351-400: 8u 401-450: 10u 450 or higher: 12u and contact physician   Yes [provider]  Insulin Degludec (TRESIBA FLEXTOUCH) 200 UNIT/ML SOPN Inject 30 Units into the skin daily. 04/01/19  Yes Vaughan Basta, MD  liraglutide (VICTOZA) 18 MG/3ML SOPN Inject 1.8 mg into the skin daily.    Yes [provider]  loratadine (CLARITIN) 10 MG tablet Take 10 mg by mouth  daily as needed for allergies.   Yes [provider]  losartan (COZAAR) 50 MG tablet Take 50 mg by mouth daily.   Yes [provider]  Multiple Vitamin (MULTIVITAMIN WITH MINERALS) TABS tablet Take 1 tablet by mouth daily. 04/01/19  Yes Vaughan Basta, MD  pyridOXINE (VITAMIN B-6) 100 MG tablet Take 100 mg by mouth daily.   Yes [provider]  solifenacin (VESICARE) 10 MG tablet Take 1 tablet (10 mg total) by mouth daily. 10/11/18  Yes Stoioff, Ronda Fairly, MD  tamsulosin (FLOMAX) 0.4 MG CAPS capsule Take 1 capsule (0.4 mg total) by mouth at bedtime. 04/01/19  Yes Vaughan Basta, MD  vitamin B-12 (CYANOCOBALAMIN) 100 MCG tablet Take 100 mcg by mouth daily.    Yes [provider]  QUEtiapine (SEROQUEL) 25 MG tablet Take 0.5 tablets (12.5 mg total) by mouth daily. Patient not taking: Reported on 05/02/2019 04/08/19   Henreitta Leber, MD  QUEtiapine (SEROQUEL) 25 MG tablet Take 1 tablet (25 mg total) by mouth at bedtime. Patient not taking: Reported on 05/02/2019 04/07/19   Henreitta Leber, MD    Allergies Iodinated diagnostic agents, Penicillins, Escitalopram, Lithium, and Erythromycin  Family History  Problem Relation Age of Onset  . CAD Brother   . Kidney cancer Mother   . Breast cancer Cousin     Social History Social History   Tobacco Use  . Smoking status: Never Smoker  . Smokeless tobacco: Never Used  Substance Use Topics  . Alcohol use: No    Alcohol/week: 0.0 standard drinks  . Drug use: No    Review of Systems  Constitutional: No fever/chills Eyes: No visual changes. ENT: No sore throat. Cardiovascular: Denies chest pain. Respiratory: Positive for shortness of breath. Gastrointestinal: No abdominal pain.  No nausea, no vomiting.  No diarrhea.  No constipation. Genitourinary: Negative for dysuria. Musculoskeletal: Negative for back pain. Skin: Negative for rash. Neurological: Negative for headaches, focal weakness or  numbness.  ____________________________________________   PHYSICAL EXAM:  VITAL SIGNS: ED Triage Vitals  Enc Vitals Group     BP      Pulse      Resp      Temp      Temp src      SpO2      Weight      Height      Head Circumference      Peak Flow      Pain Score      Pain Loc      Pain Edu?      Excl. in [place]?     Constitutional: Alert and oriented. Eyes: Conjunctivae are normal. Head: Atraumatic. Nose: No congestion/rhinnorhea. Mouth/Throat: Mucous membranes are moist. Neck: Normal ROM Cardiovascular: Normal rate, regular rhythm. Grossly normal heart sounds. Respiratory: Tachypneic with accessory muscle use.  Crackles to bilateral bases. Gastrointestinal: Soft and nontender. No distention. Genitourinary: deferred Musculoskeletal: No lower extremity tenderness nor edema. Neurologic:  Normal speech and language.  No gross focal neurologic deficits are appreciated. Skin:  Skin is warm, dry and intact. No rash noted. Psychiatric: Mood and affect are normal. Speech and behavior are normal.  ____________________________________________   LABS (all labs ordered are listed, but only abnormal results are displayed)  Labs Reviewed  CBC WITH DIFFERENTIAL/PLATELET - Abnormal; Notable for the following components:      Result Value   RBC 2.38 (*)    Hemoglobin 7.1 (*)    HCT 22.4 (*)    Platelets 487 (*)    Neutro Abs 9.0 (*)    All other components within normal limits  BASIC METABOLIC PANEL - Abnormal; Notable for the following components:   CO2 18 (*)    Glucose, Bld 331 (*)    BUN 38 (*)    Creatinine, Ser 2.97 (*)    Calcium 8.6 (*)    GFR calc non Af Amer 14 (*)    GFR calc Af Amer 17 (*)    All other components within normal limits  BRAIN NATRIURETIC PEPTIDE - Abnormal; Notable for the following components:   B Natriuretic Peptide 1,860.0 (*)    All other components within normal limits  BLOOD GAS, ARTERIAL - Abnormal; Notable for the following components:    pO2, Arterial 64 (*)    All other components within normal limits  TROPONIN I (HIGH SENSITIVITY) - Abnormal; Notable for the following components:   Troponin I (High Sensitivity) 923 (*)    All other components within normal limits  TROPONIN I (HIGH SENSITIVITY) - Abnormal; Notable for the following components:   Troponin I (High Sensitivity) 917 (*)    All other components within normal limits  SARS CORONAVIRUS 2 (HOSPITAL ORDER, [facility])  APTT  PROTIME-INR  HEPARIN LEVEL (UNFRACTIONATED)  CBC  BASIC METABOLIC PANEL  MAGNESIUM  PHOSPHORUS  HEMOGLOBIN A1C  FOLATE  IRON AND TIBC  VITAMIN B12   ____________________________________________  EKG  ED ECG REPORT I, Blake Divine, the attending physician, personally viewed and interpreted this ECG.   Date: 05/02/2019  EKG Time: 12:30  Rate: 83  Rhythm: normal sinus rhythm, LBBB  Axis: Normal  Intervals:left bundle branch block  ST&T Change: ST depression V5 and V6   PROCEDURES  Procedure(s) performed (including Critical Care):  .Critical Care Performed by: Blake Divine, MD Authorized by: Blake Divine, MD   Critical care provider statement:    Critical care time (minutes):  45   Critical care time was exclusive of:  Separately billable procedures and treating other patients and teaching time   Critical care was necessary to treat or prevent imminent or life-threatening deterioration of the following conditions:  Cardiac failure and respiratory failure   Critical care was time spent personally by me on the following activities:  Discussions with consultants, evaluation of patient's response to treatment, examination of patient, ordering and performing treatments and interventions, ordering and review of laboratory studies, ordering and review of radiographic studies, pulse oximetry, re-evaluation of patient's condition, obtaining history from patient or surrogate and review of old charts    I assumed direction of critical care for this patient from another provider in my specialty: no       ____________________________________________   INITIAL IMPRESSION / [place] / ED COURSE       78 year old female with history of CAD, hypertension, diabetes, CKD presents to the ED with approximately 1 day of worsening shortness of breath.  She was found on the ground by EMS with  O2 sats in the 70s, subsequently placed on CPAP and immediately transition to BiPAP upon arrival.  This seemed to result in improvement in patient's work of breathing and she is maintaining O2 sats appropriately on BiPAP.  EKG shows left bundle branch block pattern similar to prior, negative for S[place] criteria, but does have new ST depressions laterally.  Troponin noted to be elevated to greater than 900, patient additionally with elevated BNP and chest x-ray consistent with pulmonary edema.  No fevers or cough to suggest infectious process.  Concern for acute cardiac event leading to new onset heart failure.  Case discussed with Dr. Clayborn Bigness of cardiology, who agrees with plan for giving aspirin and starting heparin drip, will also diurese with Lasix.  Patient additionally noted to have low hemoglobin, which is a change for her over the past 3 weeks.  No blood reported in her stool and currently benefits of heparin drip would outweigh risks, but will need to closely monitor for bleeding.  Patient with improved breathing following diuresis, now self discontinued BiPAP.  Will place on nasal cannula for now and monitor for any worsening respiratory status.  Case discussed with hospitalist, who accepts patient for admission.      ____________________________________________   FINAL CLINICAL IMPRESSION(S) / ED DIAGNOSES  Final diagnoses:  NSTEMI (non-ST elevated myocardial infarction) ([place])  Acute respiratory failure with hypoxia (HCC)  Acute systolic congestive heart failure [facility])     ED  Discharge Orders    None       Note:  This document was prepared using Dragon voice recognition software and may include unintentional dictation errors.   Blake Divine, MD 05/02/19 [phone]

## 2019-05-02 NOTE — ED Notes (Signed)
ED TO INPATIENT HANDOFF REPORT  ED Nurse Name and Phone #: Alyssa/Zariyah Stephens, [phone]  S Name/Age/Gender Holly Conner 78 y.o. female Room/Bed: ED12A/ED12A  Code Status   Code Status: Full Code  Home/SNF/Other Skilled nursing facility Patient oriented to: self, place, time Is this baseline? Yes   Triage Complete: Triage complete  Chief Complaint respiratory distress  Triage Note Pt to ED via EMS from [facility] with Respiratory distress. PT found on the ground by EMS with oxygen saturation of 70% on RA. Oxygen saturation increased to 100% on NRB but WOB did not improve. CPAP applied and EMS reports a decreased WOB. Pt unable to speak in complete sentences in ED and 86% on RA. BiPAP applied quickly after arrival. PT alert and oriented x 4. Unknown if pt has had COVID exposure.    Allergies Allergies  Allergen Reactions  . Iodinated Diagnostic Agents Shortness Of Breath    Only with nuclear stress test per pt Only with nuclear stress test per pt   . Penicillins Rash    Rash at injection site. Pt states she can take Amoxicillin & Keflex   . Escitalopram   . Lithium   . Erythromycin Nausea Only and Nausea And Vomiting    Other reaction(s): Other (See Comments) dehydration     Level of Care/Admitting Diagnosis ED Disposition    ED Disposition Condition [facility] Area: [place] [[id number]]  Level of Care: ICU [6]  Covid Evaluation: Confirmed COVID Negative  Diagnosis: NSTEMI (non-ST elevated myocardial infarction) [id number]  Admitting Physician: Otila Back [[id number]]  Attending Physician: Otila Back [[id number]]  Estimated length of stay: 3 - 4 days  Certification:: I certify this patient will need inpatient services for at least 2 midnights  PT Class (Do Not Modify): Inpatient [101]  PT Acc Code (Do Not Modify): Private [1]       B Medical/Surgery History Past Medical History:  Diagnosis Date  . Arthritis   .  Depression    controlled  . Diabetes mellitus without complication ([place])    managed well;   Marland Kitchen GERD (gastroesophageal reflux disease)   . Hepatitis   . HLD (hyperlipidemia)   . Hypertension    somewhat controlled  . 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 Polymyalgia rheumatica ([place])   . Sleep apnea   . Stroke [facility])    Past Surgical History:  Procedure Laterality Date  . ABDOMINAL HYSTERECTOMY    . BILATERAL SALPINGOOPHORECTOMY    . ESOPHAGEAL DILATION    . OOPHORECTOMY    . REDUCTION MAMMAPLASTY Bilateral 2003  . REPLACEMENT TOTAL KNEE Right   . TEE WITHOUT CARDIOVERSION N/A 03/31/2019   Procedure: TRANSESOPHAGEAL ECHOCARDIOGRAM (TEE);  Surgeon: Corey Skains, MD;  Location: [facility] ORS;  Service: Cardiovascular;  Laterality: N/A;  . TONSILLECTOMY AND ADENOIDECTOMY       A IV Location/Drains/Wounds Patient Lines/Drains/Airways Status   Active Line/Drains/Airways    Name:   Placement date:   Placement time:   Site:   Days:   Peripheral IV 05/02/19 Right Antecubital   05/02/19    1232    Antecubital   less than 1   Peripheral IV 05/02/19 Left Forearm   05/02/19    1224    Forearm   less than 1   External Urinary Catheter   05/02/19    1450    -   less than 1  Intake/Output Last 24 hours No intake or output data in the 24 hours ending 05/02/19 1713  Labs/Imaging Results for orders placed or performed during the hospital encounter of 05/02/19 (from the past 48 hour(s))  CBC with Differential     Status: Abnormal   Collection Time: 05/02/19 12:32 PM  Result Value Ref Range   WBC 10.2 4.0 - 10.5 K/uL   RBC 2.38 (L) 3.87 - 5.11 MIL/uL   Hemoglobin 7.1 (L) 12.0 - 15.0 g/dL   HCT 22.4 (L) 36.0 - 46.0 %   MCV 94.1 80.0 - 100.0 fL   MCH 29.8 26.0 - 34.0 pg   MCHC 31.7 30.0 - 36.0 g/dL   RDW 15.4 11.5 - 15.5 %   Platelets 487 (H) 150 - 400 K/uL   nRBC 0.0 0.0 - 0.2 %   Neutrophils Relative % 88 %   Neutro Abs 9.0 (H) 1.7 - 7.7 K/uL    Lymphocytes Relative 7 %   Lymphs Abs 0.7 0.7 - 4.0 K/uL   Monocytes Relative 3 %   Monocytes Absolute 0.3 0.1 - 1.0 K/uL   Eosinophils Relative 1 %   Eosinophils Absolute 0.1 0.0 - 0.5 K/uL   Basophils Relative 0 %   Basophils Absolute 0.0 0.0 - 0.1 K/uL   Immature Granulocytes 1 %   Abs Immature Granulocytes 0.05 0.00 - 0.07 K/uL    Comment: Performed at [facility], [address] [place], [place] [id number]  Basic metabolic panel     Status: Abnormal   Collection Time: 05/02/19 12:32 PM  Result Value Ref Range   Sodium 136 135 - 145 mmol/L   Potassium 4.5 3.5 - 5.1 mmol/L   Chloride 104 98 - 111 mmol/L   CO2 18 (L) 22 - 32 mmol/L   Glucose, Bld 331 (H) 70 - 99 mg/dL   BUN 38 (H) 8 - 23 mg/dL   Creatinine, Ser 2.97 (H) 0.44 - 1.00 mg/dL   Calcium 8.6 (L) 8.9 - 10.3 mg/dL   GFR calc non Af Amer 14 (L) >60 mL/min   GFR calc Af Amer 17 (L) >60 mL/min   Anion gap 14 5 - 15    Comment: Performed at [facility], [address] [place], [place] [address]  Brain natriuretic peptide     Status: Abnormal   Collection Time: 05/02/19 12:32 PM  Result Value Ref Range   B Natriuretic Peptide 1,860.0 (H) 0.0 - 100.0 pg/mL    Comment: Performed at [facility], [place], [place] [address]  Troponin I (High Sensitivity)     Status: Abnormal   Collection Time: 05/02/19 12:32 PM  Result Value Ref Range   Troponin I (High Sensitivity) 923 (HH) <18 ng/L    Comment: CRITICAL RESULT CALLED TO, READ BACK BY AND VERIFIED WITH ALYSSA MCCULLEY AT 1322 ON 05/02/2019 JJB (NOTE) Elevated high sensitivity troponin I (hsTnI) values and significant  changes across serial measurements may suggest ACS but many other  chronic and acute conditions are known to elevate hsTnI results.  Refer to the "Links" section for chest pain algorithms and additional  guidance. Performed at [facility], [address] [place], [place] [address]   SARS  Coronavirus 2 [facility] order, Performed in [facility] hospital lab) Nasopharyngeal Nasopharyngeal Swab     Status: None   Collection Time: 05/02/19 12:32 PM   Specimen: Nasopharyngeal Swab  Result Value Ref Range   SARS Coronavirus 2 NEGATIVE NEGATIVE  Comment: (NOTE) If result is NEGATIVE SARS-CoV-2 target nucleic acids are NOT DETECTED. The SARS-CoV-2 RNA is generally detectable in upper and lower  respiratory specimens during the acute phase of infection. The lowest  concentration of SARS-CoV-2 viral copies this assay can detect is 250  copies / mL. A negative result does not preclude SARS-CoV-2 infection  and should not be used as the sole basis for treatment or other  patient management decisions.  A negative result may occur with  improper specimen collection / handling, submission of specimen other  than nasopharyngeal swab, presence of viral mutation(s) within the  areas targeted by this assay, and inadequate number of viral copies  (<250 copies / mL). A negative result must be combined with clinical  observations, patient history, and epidemiological information. If result is POSITIVE SARS-CoV-2 target nucleic acids are DETECTED. The SARS-CoV-2 RNA is generally detectable in upper and lower  respiratory specimens dur ing the acute phase of infection.  Positive  results are indicative of active infection with SARS-CoV-2.  Clinical  correlation with patient history and other diagnostic information is  necessary to determine patient infection status.  Positive results do  not rule out bacterial infection or co-infection with other viruses. If result is PRESUMPTIVE POSTIVE SARS-CoV-2 nucleic acids MAY BE PRESENT.   A presumptive positive result was obtained on the submitted specimen  and confirmed on repeat testing.  While 2019 novel coronavirus  (SARS-CoV-2) nucleic acids may be present in the submitted sample  additional confirmatory testing may be necessary for  epidemiological  and / or clinical management purposes  to differentiate between  SARS-CoV-2 and other Sarbecovirus currently known to infect humans.  If clinically indicated additional testing with an alternate test  methodology [phone]) is advised. The SARS-CoV-2 RNA is generally  detectable in upper and lower respiratory sp ecimens during the acute  phase of infection. The expected result is Negative. Fact Sheet for Patients:  StrictlyIdeas.no Fact Sheet for Healthcare Providers: BankingDealers.co.za This test is not yet approved or cleared by the [place] FDA and has been authorized for detection and/or diagnosis of SARS-CoV-2 by FDA under an Emergency Use Authorization (EUA).  This EUA will remain in effect (meaning this test can be used) for the duration of the COVID-19 declaration under Section 564(b)(1) of the Act, 21 U.S.C. section 360bbb-3(b)(1), unless the authorization is terminated or revoked sooner. Performed at [facility], [place], [place] [address]   Troponin I (High Sensitivity)     Status: Abnormal   Collection Time: 05/02/19  3:13 PM  Result Value Ref Range   Troponin I (High Sensitivity) 917 (HH) <18 ng/L    Comment: CRITICAL VALUE NOTED. VALUE IS CONSISTENT WITH PREVIOUSLY REPORTED/CALLED VALUE MJU (NOTE) Elevated high sensitivity troponin I (hsTnI) values and significant  changes across serial measurements may suggest ACS but many other  chronic and acute conditions are known to elevate hsTnI results.  Refer to the "Links" section for chest pain algorithms and additional  guidance. Performed at [facility], [address] [place], [place] [address]   APTT     Status: None   Collection Time: 05/02/19  3:13 PM  Result Value Ref Range   aPTT 25 24 - 36 seconds    Comment: Performed at [facility], [address] [place], [place] [address]  Protime-INR      Status: None   Collection Time: 05/02/19  3:13 PM  Result Value Ref Range   Prothrombin Time  13.3 11.4 - 15.2 seconds   INR 1.0 0.8 - 1.2    Comment: (NOTE) INR goal varies based on device and disease states. Performed at [facility], [address] [place], [place] [address]    Ct Head Wo Contrast  Result Date: 05/02/2019 CLINICAL DATA:  from [facility] with Respiratory distress. PT found on the ground by EMS with oxygen saturation of 70% on RA. Oxygen saturation increased to 100% on NRB but WOB did not improve. EXAM: CT HEAD WITHOUT CONTRAST CT CERVICAL SPINE WITHOUT CONTRAST TECHNIQUE: Multidetector CT imaging of the head and cervical spine was performed following the standard protocol without intravenous contrast. Multiplanar CT image reconstructions of the cervical spine were also generated. COMPARISON:  04/03/2019 FINDINGS: CT HEAD FINDINGS Brain: There is central and cortical atrophy. Periventricular white matter changes are consistent with small vessel disease. There is no intra or extra-axial fluid collection or mass lesion. The basilar cisterns and ventricles have a normal appearance. There is no CT evidence for acute infarction or hemorrhage. Vascular: There is minimal atheromatous sclerotic calcification of the internal carotid arteries. No hyperdense vessels. Skull: Normal. Negative for fracture or focal lesion. Sinuses/Orbits: No acute finding. Other: None CT CERVICAL SPINE FINDINGS Alignment: There is loss of cervical lordosis. This may be secondary to splinting, soft tissue injury, or positioning. Otherwise alignment is normal. Skull base and vertebrae: No acute fracture. No primary bone lesion or focal pathologic process. Soft tissues and spinal canal: No prevertebral fluid or swelling. No visible canal hematoma. Disc levels: Disc height loss and uncovertebral spurring in the mid cervical spine. Upper chest: Partially imaged bilateral pleural effusion. There is  question of soft tissue foreign body within the proximal trachea. Other: There is atherosclerotic calcification of the carotid arteries. IMPRESSION: 1. No evidence for acute intracranial abnormality. 2. Atrophy and small vessel disease. 3. Loss of cervical lordosis. 4. No evidence for acute cervical spine abnormality. 5. Question of soft tissue foreign body within the proximal trachea. 6. Partially imaged bilateral pleural effusion. 7. Consider CT of the chest for further evaluation. These results were called by telephone at the time of interpretation on 05/02/2019 at 2:55 pm to Dr. Blake Divine , who verbally acknowledged these results. Electronically Signed   By: Nolon Nations M.D.   On: 05/02/2019 14:58   Ct Cervical Spine Wo Contrast  Result Date: 05/02/2019 CLINICAL DATA:  from [facility] with Respiratory distress. PT found on the ground by EMS with oxygen saturation of 70% on RA. Oxygen saturation increased to 100% on NRB but WOB did not improve. EXAM: CT HEAD WITHOUT CONTRAST CT CERVICAL SPINE WITHOUT CONTRAST TECHNIQUE: Multidetector CT imaging of the head and cervical spine was performed following the standard protocol without intravenous contrast. Multiplanar CT image reconstructions of the cervical spine were also generated. COMPARISON:  04/03/2019 FINDINGS: CT HEAD FINDINGS Brain: There is central and cortical atrophy. Periventricular white matter changes are consistent with small vessel disease. There is no intra or extra-axial fluid collection or mass lesion. The basilar cisterns and ventricles have a normal appearance. There is no CT evidence for acute infarction or hemorrhage. Vascular: There is minimal atheromatous sclerotic calcification of the internal carotid arteries. No hyperdense vessels. Skull: Normal. Negative for fracture or focal lesion. Sinuses/Orbits: No acute finding. Other: None CT CERVICAL SPINE FINDINGS Alignment: There is loss of cervical lordosis. This may be  secondary to splinting, soft tissue injury, or positioning. Otherwise alignment is normal. Skull base and vertebrae: No  acute fracture. No primary bone lesion or focal pathologic process. Soft tissues and spinal canal: No prevertebral fluid or swelling. No visible canal hematoma. Disc levels: Disc height loss and uncovertebral spurring in the mid cervical spine. Upper chest: Partially imaged bilateral pleural effusion. There is question of soft tissue foreign body within the proximal trachea. Other: There is atherosclerotic calcification of the carotid arteries. IMPRESSION: 1. No evidence for acute intracranial abnormality. 2. Atrophy and small vessel disease. 3. Loss of cervical lordosis. 4. No evidence for acute cervical spine abnormality. 5. Question of soft tissue foreign body within the proximal trachea. 6. Partially imaged bilateral pleural effusion. 7. Consider CT of the chest for further evaluation. These results were called by telephone at the time of interpretation on 05/02/2019 at 2:55 pm to Dr. Blake Divine , who verbally acknowledged these results. Electronically Signed   By: Nolon Nations M.D.   On: 05/02/2019 14:58   Dg Chest Portable 1 View  Result Date: 05/02/2019 CLINICAL DATA:  Respiratory distress. Hypoxia. EXAM: PORTABLE CHEST 1 VIEW COMPARISON:  04/03/2019. FINDINGS: Stable enlargement of the cardiac silhouette. Increased prominence of the pulmonary vasculature and interstitial markings. Interval patchy opacities at both lung bases and in the left mid and upper lung zones. Minimal patchy opacity in the right upper lung zone. No definite pleural fluid seen. Mild thoracic spine degenerative changes. IMPRESSION: 1. Interval patchy atelectasis, alveolar edema or pneumonia in both lungs. 2. Stable cardiomegaly with interval changes of congestive heart failure. Electronically Signed   By: Claudie Revering M.D.   On: 05/02/2019 13:26    Pending Labs Unresulted Labs (From admission, onward)     Start     Ordered   05/03/19 0500  CBC  Tomorrow morning,   STAT     05/02/19 1448   05/03/19 0000  Heparin level (unfractionated)  Once-Timed,   STAT     05/02/19 1611          Vitals/Pain Today's Vitals   05/02/19 1400 05/02/19 1430 05/02/19 1500 05/02/19 1530  BP: (!) 113/44 127/60 103/87   Pulse: 75 78 79   Resp: 15 17 17    Temp:    (S) (!) 96.8 F (36 C)  TempSrc:    Rectal  SpO2: 99% 100% 99%   Weight:        Isolation Precautions No active isolations  Medications Medications  heparin bolus via infusion 4,000 Units (4,000 Units Intravenous Bolus from Bag 05/02/19 1544)    Followed by  heparin ADULT infusion 100 units/mL (25000 units/23mL sodium chloride 0.45%) (800 Units/hr Intravenous New Bag/Given 05/02/19 1547)  atorvastatin (LIPITOR) tablet 80 mg (has no administration in time range)  losartan (COZAAR) tablet 50 mg (has no administration in time range)  Insulin Degludec SOPN 30 Units (has no administration in time range)  tamsulosin (FLOMAX) capsule 0.4 mg (has no administration in time range)  vitamin B-12 (CYANOCOBALAMIN) tablet 100 mcg (has no administration in time range)  vitamin C (ASCORBIC ACID) tablet 1,000 mg (has no administration in time range)  multivitamin with minerals tablet 1 tablet (has no administration in time range)  pyridOXINE (VITAMIN B-6) tablet 100 mg (has no administration in time range)  loratadine (CLARITIN) tablet 10 mg (has no administration in time range)  aspirin EC tablet 81 mg (has no administration in time range)  nitroGLYCERIN (NITROSTAT) SL tablet 0.4 mg (has no administration in time range)  acetaminophen (TYLENOL) tablet 650 mg (has no administration in time range)  ondansetron (ZOFRAN) injection  4 mg (has no administration in time range)  aspirin chewable tablet 324 mg (324 mg Oral Given 05/02/19 1456)  furosemide (LASIX) injection 40 mg (40 mg Intravenous Given 05/02/19 1454)    Mobility walks with device     Focused  Assessments Cardiac Assessment Handoff:    Lab Results  Component Value Date   CKTOTAL 259 (H) 11/10/2012   CKMB 3.4 11/10/2012   TROPONINI 0.08 (HH) 12/19/2018   No results found for: DDIMER Does the Patient currently have chest pain? No  , Pulmonary Assessment Handoff:  Lung sounds:   O2 Device: Bi-PAP        R Recommendations: See Admitting Provider Note  Report given to:   Additional Notes:

## 2019-05-02 NOTE — ED Notes (Signed)
Pt's alarm going off and pt calling for help. Upon entrance pt had taken off BIPAP. MD informed and stated we could ween pt off. Pt O2 at 89%, 2L of O2 applied at this time. RN informed. Pt resting comfortably at this time.

## 2019-05-02 NOTE — ED Notes (Signed)
Spoke to MD Jannifer Franklin about pt. Pt is currently assigned to ICU/stepdown level of care. Pt no longer on bipap, has been on n/c O2 since 1755 with normal O2 sat. Per MD, pt will still need stepdown/intermediate care d/t elevated troponin, heparin drip, and GI bleed. Asked if pt need repeat CBC soon since next level is not due until tomorrow morning. MD states he will place an order for stat cbc.

## 2019-05-02 NOTE — ED Notes (Signed)
Repeat hemoglobin 6.7 reported to MD Willis. MD states he will order 1 unit PRBCs. Type and screen sent. Per MD, heparin drip should continue at this time. Will continue to monitor.

## 2019-05-03 ENCOUNTER — Inpatient Hospital Stay
Admit: 2019-05-03 | Discharge: 2019-05-03 | Disposition: A | Discharge status: Home / Self Care | Payer: Medicare Other | Attending: Internal Medicine | Admitting: Internal Medicine

## 2019-05-03 DIAGNOSIS — I214 Non-ST elevation (NSTEMI) myocardial infarction: Secondary | ICD-10-CM

## 2019-05-03 DIAGNOSIS — J9601 Acute respiratory failure with hypoxia: Secondary | ICD-10-CM

## 2019-05-03 LAB — GLUCOSE, CAPILLARY
Glucose-Capillary: 125 mg/dL — ABNORMAL HIGH (ref 70–99)
Glucose-Capillary: 61 mg/dL — ABNORMAL LOW (ref 70–99)
Glucose-Capillary: 63 mg/dL — ABNORMAL LOW (ref 70–99)
Glucose-Capillary: 62 mg/dL — ABNORMAL LOW (ref 70–99)
Glucose-Capillary: 107 mg/dL — ABNORMAL HIGH (ref 70–99)
Glucose-Capillary: 231 mg/dL — ABNORMAL HIGH (ref 70–99)
Glucose-Capillary: 228 mg/dL — ABNORMAL HIGH (ref 70–99)

## 2019-05-03 LAB — CBC
HCT: 25.3 % — ABNORMAL LOW (ref 36.0–46.0)
Hemoglobin: 8.1 g/dL — ABNORMAL LOW (ref 12.0–15.0)
MCH: 29.7 pg (ref 26.0–34.0)
MCHC: 32 g/dL (ref 30.0–36.0)
MCV: 92.7 fL (ref 80.0–100.0)
Platelets: 458 10*3/uL — ABNORMAL HIGH (ref 150–400)
RBC: 2.73 MIL/uL — ABNORMAL LOW (ref 3.87–5.11)
RDW: 15.3 % (ref 11.5–15.5)
WBC: 7.5 10*3/uL (ref 4.0–10.5)
nRBC: 0 % (ref 0.0–0.2)

## 2019-05-03 LAB — PREPARE RBC (CROSSMATCH)

## 2019-05-03 LAB — PHOSPHORUS: Phosphorus: 4.5 mg/dL (ref 2.5–4.6)

## 2019-05-03 LAB — MRSA PCR SCREENING: MRSA by PCR: NEGATIVE

## 2019-05-03 LAB — BASIC METABOLIC PANEL
Anion gap: 10 (ref 5–15)
BUN: 40 mg/dL — ABNORMAL HIGH (ref 8–23)
CO2: 21 mmol/L — ABNORMAL LOW (ref 22–32)
Calcium: 8.7 mg/dL — ABNORMAL LOW (ref 8.9–10.3)
Chloride: 110 mmol/L (ref 98–111)
Creatinine, Ser: 2.76 mg/dL — ABNORMAL HIGH (ref 0.44–1.00)
GFR calc Af Amer: 18 mL/min — ABNORMAL LOW (ref >60)
GFR calc non Af Amer: 16 mL/min — ABNORMAL LOW (ref >60)
Glucose, Bld: 102 mg/dL — ABNORMAL HIGH (ref 70–99)
Potassium: 3.9 mmol/L (ref 3.5–5.1)
Sodium: 141 mmol/L (ref 135–145)

## 2019-05-03 LAB — MAGNESIUM: Magnesium: 2 mg/dL (ref 1.7–2.4)

## 2019-05-03 LAB — HEMOGLOBIN A1C
Hgb A1c MFr Bld: 6 % — ABNORMAL HIGH (ref 4.8–5.6)
Mean Plasma Glucose: 125.5 mg/dL

## 2019-05-03 LAB — VITAMIN B12: Vitamin B-12: 437 pg/mL (ref 180–914)

## 2019-05-03 LAB — HEPARIN LEVEL (UNFRACTIONATED)
Heparin Unfractionated: 0.1 IU/mL — ABNORMAL LOW (ref 0.30–0.70)
Heparin Unfractionated: 0.57 IU/mL (ref 0.30–0.70)
Heparin Unfractionated: 0.46 IU/mL (ref 0.30–0.70)

## 2019-05-03 MED ORDER — DEXTROSE 50 % IV SOLN
12.5000 g | Freq: Once | INTRAVENOUS | Status: AC
  Administered 2019-05-03: 04:00:00 12.5 g via INTRAVENOUS

## 2019-05-03 MED ORDER — METOPROLOL TARTRATE 25 MG PO TABS
25.0000 mg | ORAL_TABLET | Freq: Two times a day (BID) | ORAL | Status: DC
  Administered 2019-05-03 – 2019-05-05 (×5): 25 mg via ORAL
  Filled 2019-05-03 (×5): qty 1

## 2019-05-03 MED ORDER — PANTOPRAZOLE SODIUM 40 MG IV SOLR
40.0000 mg | INTRAVENOUS | Status: DC
  Administered 2019-05-03 – 2019-05-05 (×3): 40 mg via INTRAVENOUS
  Filled 2019-05-03 (×3): qty 40

## 2019-05-03 MED ORDER — ORAL CARE MOUTH RINSE
15.0000 mL | Freq: Two times a day (BID) | OROMUCOSAL | Status: DC
  Administered 2019-05-03 (×2): 15 mL via OROMUCOSAL

## 2019-05-03 MED ORDER — CEFDINIR 300 MG PO CAPS
300.0000 mg | ORAL_CAPSULE | Freq: Every day | ORAL | Status: DC
  Administered 2019-05-03 – 2019-05-05 (×3): 300 mg via ORAL
  Filled 2019-05-03 (×3): qty 1

## 2019-05-03 MED ORDER — CHLORHEXIDINE GLUCONATE CLOTH 2 % EX PADS
6.0000 | MEDICATED_PAD | Freq: Every day | CUTANEOUS | Status: DC
  Administered 2019-05-03 – 2019-05-04 (×2): 6 via TOPICAL
  Filled 2019-05-03: qty 6

## 2019-05-03 MED ORDER — DEXTROSE-NACL 5-0.45 % IV SOLN: INTRAVENOUS | Status: DC

## 2019-05-03 MED ORDER — DEXTROSE 50 % IV SOLN
INTRAVENOUS | Status: AC
  Filled 2019-05-03: qty 50

## 2019-05-03 MED ORDER — HEPARIN BOLUS VIA INFUSION
2000.0000 [IU] | Freq: Once | INTRAVENOUS | Status: AC
  Administered 2019-05-03: 06:00:00 2000 [IU] via INTRAVENOUS
  Filled 2019-05-03: qty 2000

## 2019-05-03 NOTE — Progress Notes (Signed)
*  PRELIMINARY RESULTS* Echocardiogram 2D Echocardiogram has been performed.  Holly Conner Holly Conner 05/03/2019, 9:38 AM

## 2019-05-03 NOTE — Progress Notes (Signed)
[facility] [place] NAME: Holly Conner    MR#:  [id number]  DATE OF BIRTH:  1940-12-19  SUBJECTIVE:  Denies CP doing ok  BS was 63 this am  REVIEW OF SYSTEMS:     Review of Systems  Constitutional: Negative for fever, chills weight loss HENT: Negative for ear pain, nosebleeds, congestion, facial swelling, rhinorrhea, neck pain, neck stiffness and ear discharge.   Respiratory: Negative for cough, shortness of breath, wheezing  Cardiovascular: Negative for chest pain, palpitations and leg swelling.  Gastrointestinal: Negative for heartburn, abdominal pain, vomiting, diarrhea or consitpation Genitourinary: Negative for dysuria, urgency, frequency, hematuria Musculoskeletal: Negative for back pain or joint pain Neurological: Negative for dizziness, seizures, syncope, focal weakness,  numbness and headaches.  Hematological: Does not bruise/bleed easily.  Psychiatric/Behavioral: Negative for hallucinations, confusion, dysphoric mood    Tolerating Diet: yes      DRUG ALLERGIES:   Allergies  Allergen Reactions  . Iodinated Diagnostic Agents Shortness Of Breath    Only with nuclear stress test per pt Only with nuclear stress test per pt   . Penicillins Rash    Rash at injection site. Pt states she can take Amoxicillin & Keflex   . Escitalopram   . Lithium   . Erythromycin Nausea Only and Nausea And Vomiting    Other reaction(s): Other (See Comments) dehydration     VITALS:  Blood pressure (!) 155/75, pulse 67, temperature 97.9 F (36.6 C), resp. rate 15, height 5\' 1"  (1.549 m), weight 82.4 kg, SpO2 96 %.  PHYSICAL EXAMINATION:  Constitutional: Appears well-developed and well-nourished. No distress. HENT: Normocephalic. Marland Kitchen Oropharynx is clear and moist.  Eyes: Conjunctivae and EOM are normal. PERRLA, no scleral icterus.  Neck: Normal ROM. Neck supple. No JVD. No tracheal deviation. CVS: RRR, S1/S2 +, no murmurs, no  gallops, no carotid bruit.  Pulmonary: Effort and breath sounds normal, no stridor, rhonchi, wheezes, rales.  Abdominal: Soft. BS +,  no distension, tenderness, rebound or guarding.  Musculoskeletal: Normal range of motion. No edema and no tenderness.  Neuro: Alert. CN 2-12 grossly intact. No focal deficits. Skin: Skin is warm and dry. No rash noted. Psychiatric: Normal mood and affect.      LABORATORY PANEL:   CBC Recent Labs  Lab 05/03/19 0449  WBC 7.5  HGB 8.1*  HCT 25.3*  PLT 458*   ------------------------------------------------------------------------------------------------------------------  Chemistries  Recent Labs  Lab 05/03/19 0449  NA 141  K 3.9  CL 110  CO2 21*  GLUCOSE 102*  BUN 40*  CREATININE 2.76*  CALCIUM 8.7*  MG 2.0   ------------------------------------------------------------------------------------------------------------------  Cardiac Enzymes No results for input(s): TROPONINI in the last 168 hours. ------------------------------------------------------------------------------------------------------------------  RADIOLOGY:  Ct Head Wo Contrast  Result Date: 05/02/2019 CLINICAL DATA:  from [facility] with Respiratory distress. PT found on the ground by EMS with oxygen saturation of 70% on RA. Oxygen saturation increased to 100% on NRB but WOB did not improve. EXAM: CT HEAD WITHOUT CONTRAST CT CERVICAL SPINE WITHOUT CONTRAST TECHNIQUE: Multidetector CT imaging of the head and cervical spine was performed following the standard protocol without intravenous contrast. Multiplanar CT image reconstructions of the cervical spine were also generated. COMPARISON:  04/03/2019 FINDINGS: CT HEAD FINDINGS Brain: There is central and cortical atrophy. Periventricular white matter changes are consistent with small vessel disease. There is no intra or extra-axial fluid collection or mass lesion. The basilar cisterns and ventricles have a normal  appearance. There is no  CT evidence for acute infarction or hemorrhage. Vascular: There is minimal atheromatous sclerotic calcification of the internal carotid arteries. No hyperdense vessels. Skull: Normal. Negative for fracture or focal lesion. Sinuses/Orbits: No acute finding. Other: None CT CERVICAL SPINE FINDINGS Alignment: There is loss of cervical lordosis. This may be secondary to splinting, soft tissue injury, or positioning. Otherwise alignment is normal. Skull base and vertebrae: No acute fracture. No primary bone lesion or focal pathologic process. Soft tissues and spinal canal: No prevertebral fluid or swelling. No visible canal hematoma. Disc levels: Disc height loss and uncovertebral spurring in the mid cervical spine. Upper chest: Partially imaged bilateral pleural effusion. There is question of soft tissue foreign body within the proximal trachea. Other: There is atherosclerotic calcification of the carotid arteries. IMPRESSION: 1. No evidence for acute intracranial abnormality. 2. Atrophy and small vessel disease. 3. Loss of cervical lordosis. 4. No evidence for acute cervical spine abnormality. 5. Question of soft tissue foreign body within the proximal trachea. 6. Partially imaged bilateral pleural effusion. 7. Consider CT of the chest for further evaluation. These results were called by telephone at the time of interpretation on 05/02/2019 at 2:55 pm to Dr. Blake Divine , who verbally acknowledged these results. Electronically Signed   By: Nolon Nations M.D.   On: 05/02/2019 14:58   Ct Chest Wo Contrast  Result Date: 05/02/2019 CLINICAL DATA:  Chest pain EXAM: CT CHEST WITHOUT CONTRAST TECHNIQUE: Multidetector CT imaging of the chest was performed following the standard protocol without IV contrast. COMPARISON:  12/21/2012 FINDINGS: Cardiovascular: Heart is enlarged. Diffuse coronary artery and aortic calcifications. No evidence of aortic aneurysm. Mediastinum/Nodes: Borderline scattered  mediastinal lymph nodes. Low right paratracheal lymph node has a short axis diameter of 11 mm. Other similarly sized or smaller scattered mediastinal lymph nodes. No visible hilar adenopathy. No axial adenopathy. Isthmic thyroid nodule measures 2.4 by 2.0 cm. This has enlarged slightly since 2014 study when this measured 2.0 x 1.6 cm. Lungs/Pleura: They are are large bilateral pleural effusions. Extensive airspace disease in both lungs, most confluent in the lower lobes but also noted in both upper lobes compatible with multifocal pneumonia. Background of interstitial thickening and ground-glass opacities could reflect underlying edema. Upper Abdomen: Imaging into the upper abdomen shows no acute findings. Musculoskeletal: Chest wall soft tissues are unremarkable. No acute bony abnormality. IMPRESSION: Large bilateral pleural effusions. Cardiomegaly.  Diffuse coronary artery disease. Bilateral airspace opacities, most confluent in the lower lobes but also patchy opacities in the upper lobes most compatible with multifocal pneumonia. There may be a component of pulmonary edema in the background. Borderline sized mediastinal lymph nodes, likely reactive. Aortic Atherosclerosis (ICD10-I70.0). Electronically Signed   By: Rolm Baptise M.D.   On: 05/02/2019 21:53   Ct Cervical Spine Wo Contrast  Result Date: 05/02/2019 CLINICAL DATA:  from [facility] with Respiratory distress. PT found on the ground by EMS with oxygen saturation of 70% on RA. Oxygen saturation increased to 100% on NRB but WOB did not improve. EXAM: CT HEAD WITHOUT CONTRAST CT CERVICAL SPINE WITHOUT CONTRAST TECHNIQUE: Multidetector CT imaging of the head and cervical spine was performed following the standard protocol without intravenous contrast. Multiplanar CT image reconstructions of the cervical spine were also generated. COMPARISON:  04/03/2019 FINDINGS: CT HEAD FINDINGS Brain: There is central and cortical atrophy. Periventricular white  matter changes are consistent with small vessel disease. There is no intra or extra-axial fluid collection or mass lesion. The basilar cisterns and ventricles have a  normal appearance. There is no CT evidence for acute infarction or hemorrhage. Vascular: There is minimal atheromatous sclerotic calcification of the internal carotid arteries. No hyperdense vessels. Skull: Normal. Negative for fracture or focal lesion. Sinuses/Orbits: No acute finding. Other: None CT CERVICAL SPINE FINDINGS Alignment: There is loss of cervical lordosis. This may be secondary to splinting, soft tissue injury, or positioning. Otherwise alignment is normal. Skull base and vertebrae: No acute fracture. No primary bone lesion or focal pathologic process. Soft tissues and spinal canal: No prevertebral fluid or swelling. No visible canal hematoma. Disc levels: Disc height loss and uncovertebral spurring in the mid cervical spine. Upper chest: Partially imaged bilateral pleural effusion. There is question of soft tissue foreign body within the proximal trachea. Other: There is atherosclerotic calcification of the carotid arteries. IMPRESSION: 1. No evidence for acute intracranial abnormality. 2. Atrophy and small vessel disease. 3. Loss of cervical lordosis. 4. No evidence for acute cervical spine abnormality. 5. Question of soft tissue foreign body within the proximal trachea. 6. Partially imaged bilateral pleural effusion. 7. Consider CT of the chest for further evaluation. These results were called by telephone at the time of interpretation on 05/02/2019 at 2:55 pm to Dr. Blake Divine , who verbally acknowledged these results. Electronically Signed   By: Nolon Nations M.D.   On: 05/02/2019 14:58   Dg Chest Portable 1 View  Result Date: 05/02/2019 CLINICAL DATA:  Respiratory distress. Hypoxia. EXAM: PORTABLE CHEST 1 VIEW COMPARISON:  04/03/2019. FINDINGS: Stable enlargement of the cardiac silhouette. Increased prominence of the  pulmonary vasculature and interstitial markings. Interval patchy opacities at both lung bases and in the left mid and upper lung zones. Minimal patchy opacity in the right upper lung zone. No definite pleural fluid seen. Mild thoracic spine degenerative changes. IMPRESSION: 1. Interval patchy atelectasis, alveolar edema or pneumonia in both lungs. 2. Stable cardiomegaly with interval changes of congestive heart failure. Electronically Signed   By: Claudie Revering M.D.   On: 05/02/2019 13:26     ASSESSMENT AND PLAN:   78 year old female with chronic kidney disease stage IV and diabetes who presented to the emergency room due to shortness of breath.  1.  Acute hypoxic respiratory failure in the setting of multifocal pneumonia and bilateral pleural effusion/chronic diastolic heart failure: She weaned off of BiPAP and on nasal cannula. Continue IV Lasix Monitor intake and output with daily weight COVID negative   2.  Multifocal pneumonia: Continue Omnicef  3.  Elevated troponin: Currently on heparin drip Awaiting cardiology consult Follow-up on echo , Statin And metoprolol 4.  Anemia: Status post 1 unit PRBC Guaiac positive Panel consistent with chronic disease Hemoglobin stable 5.  Diabetes: Diabetes is controlled with A1c of 6.0  6.  Chronic kidney disease stage IV: Creatinine remains stable  7. HTN: Continue Losrtan (watch creatinine) add Metoprolol for better BP control   PT consult for d/c planning and CSW consult  Management plans discussed with the patient and she is in agreement.  CODE STATUS: FULL  TOTAL TIME TAKING CARE OF THIS PATIENT: 30 minutes.     POSSIBLE D/C 2 days, DEPENDING ON CLINICAL CONDITION.   Bettey Costa M.D on 05/03/2019 at 10:25 AM  Between 7am to 6pm - Pager - [phone] After 6pm go to [email or web address] - password [email or web address] [facility]  Office  [phone]  CC: Primary care physician; Dion Body, MD  Note: This  dictation was prepared with Dragon dictation along with smaller phrase technology. Any  transcriptional errors that result from this process are unintentional.

## 2019-05-03 NOTE — Consult Note (Signed)
ANTICOAGULATION CONSULT NOTE -   Pharmacy Consult for Heparin Infusion Dosing and monitoring  Indication: chest pain/ACS  Allergies  Allergen Reactions  . Iodinated Diagnostic Agents Shortness Of Breath    Only with nuclear stress test per pt Only with nuclear stress test per pt   . Penicillins Rash    Rash at injection site. Pt states she can take Amoxicillin & Keflex   . Escitalopram   . Lithium   . Erythromycin Nausea Only and Nausea And Vomiting    Other reaction(s): Other (See Comments) dehydration     Patient Measurements: Height: 5\' 2"  (157.5 cm) Weight: 184 lb 11.9 oz (83.8 kg) IBW/kg (Calculated) : 50.1 Heparin Dosing Weight: 68 Kg  Vital Signs: Temp: 97.6 F (36.4 C) (09/05 0400) Temp Source: Oral (09/05 0400) BP: 155/83 (09/05 0500) Pulse Rate: 72 (09/05 0500)  Labs: Recent Labs    05/02/19 1232 05/02/19 1513 05/02/19 2103 05/03/19 0449  HGB 7.1*  --  6.7* 8.1*  HCT 22.4*  --  20.7* 25.3*  PLT 487*  --  465* 458*  APTT  --  25  --   --   LABPROT  --  13.3  --   --   INR  --  1.0  --   --   HEPARINUNFRC  --   --   --  0.10*  CREATININE 2.97*  --   --  2.76*  TROPONINIHS 923* 917*  --   --     Estimated Creatinine Clearance: 16.3 mL/min (A) (by C-G formula based on SCr of 2.76 mg/dL (H)).   Medical History: Past Medical History:  Diagnosis Date  . Arthritis   . Depression    controlled  . Diabetes mellitus without complication ([place])    managed well;   Marland Kitchen GERD (gastroesophageal reflux disease)   . Hepatitis   . HLD (hyperlipidemia)   . Hypertension    somewhat controlled  . 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 Polymyalgia rheumatica ([place])   . Sleep apnea   . Stroke [facility])     Assessment: Pharmacy consulted for heparin infusion dosing and monitoring in 78 yo female admitted with respiratory distress and elevated troponin.  CrCl ~15 mL/min   Hgb: 7.1    HCT: 22.4 . No anticoagulants prior to admission.   Troponin:  923   9/5 @ 0449 HL = 0.10, subtherapeutic, confirmed w/ RN no problems w/ infusion  Goal of Therapy:  Heparin level 0.3-0.7 units/ml Monitor platelets by anticoagulation protocol: Yes   Plan:  Rebolus w/ Heparin 2000 units bolus x 1 Increase heparin infusion to 1000 units/hr Check anti-Xa level 8 hours after rate increase and daily while on heparin Continue to monitor H&H and platelets  Ena Dawley, PharmD Clinical Pharmacist 05/03/2019 5:35 AM

## 2019-05-03 NOTE — Progress Notes (Signed)
PT Cancellation Note  Patient Details Name: Holly Conner MRN: [id number] DOB: [birth year]   Cancelled Treatment:    Reason Eval/Treat Not Completed: Medical issues which prohibited therapy(Consult received and chart reviewed. Per chart, patient noted with elevated high-sensitivity troponin (923) with pending cardiology consult.  Currently on heparin drip.  Will hold PT evaluation at this time until cardiology consult complete and patient cleared for activity.)  Ardel Jagger H. Owens Shark, PT, DPT, NCS 05/03/19, 3:36 PM [phone]

## 2019-05-03 NOTE — ED Notes (Signed)
Depends and bed alarm applied.

## 2019-05-03 NOTE — Consult Note (Signed)
ANTICOAGULATION CONSULT NOTE -   Pharmacy Consult for Heparin Infusion Dosing and monitoring  Indication: chest pain/ACS  Allergies  Allergen Reactions  . Iodinated Diagnostic Agents Shortness Of Breath    Only with nuclear stress test per pt Only with nuclear stress test per pt   . Penicillins Rash    Rash at injection site. Pt states she can take Amoxicillin & Keflex   . Escitalopram   . Lithium   . Erythromycin Nausea Only and Nausea And Vomiting    Other reaction(s): Other (See Comments) dehydration     Patient Measurements: Height: 5\' 1"  (154.9 cm) Weight: 181 lb 9.6 oz (82.4 kg) IBW/kg (Calculated) : 47.8 Heparin Dosing Weight: 68 Kg  Vital Signs: Temp: 97.9 F (36.6 C) (09/05 1012) Temp Source: Oral (09/05 0800) BP: 155/75 (09/05 1012) Pulse Rate: 67 (09/05 1012)  Labs: Recent Labs    05/02/19 1232 05/02/19 1513 05/02/19 2103 05/03/19 0449 05/03/19 1355  HGB 7.1*  --  6.7* 8.1*  --   HCT 22.4*  --  20.7* 25.3*  --   PLT 487*  --  465* 458*  --   APTT  --  25  --   --   --   LABPROT  --  13.3  --   --   --   INR  --  1.0  --   --   --   HEPARINUNFRC  --   --   --  0.10* 0.57  CREATININE 2.97*  --   --  2.76*  --   TROPONINIHS 923* 917*  --   --   --     Estimated Creatinine Clearance: 15.8 mL/min (A) (by C-G formula based on SCr of 2.76 mg/dL (H)).   Medical History: Past Medical History:  Diagnosis Date  . Arthritis   . Depression    controlled  . Diabetes mellitus without complication ([place])    managed well;   Marland Kitchen GERD (gastroesophageal reflux disease)   . Hepatitis   . HLD (hyperlipidemia)   . Hypertension    somewhat controlled  . 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 Polymyalgia rheumatica ([place])   . Sleep apnea   . Stroke [facility])     Assessment: Pharmacy consulted for heparin infusion dosing and monitoring in 78 yo female admitted with respiratory distress and elevated troponin.  CrCl ~15 mL/min   Hgb: 7.1    HCT:  22.4 . No anticoagulants prior to admission.   Troponin: 923   9/5 @ 0449 HL = 0.10, subtherapeutic, confirmed w/ RN no problems w/ infusion 9/5 @ 1355 HL 0.57   Goal of Therapy:  Heparin level 0.3-0.7 units/ml Monitor platelets by anticoagulation protocol: Yes   Plan:  Heparin level therapeutic.  Continue heparin infusion at 1000 units/hr Check anti-Xa level 8 hours and daily CBC while on heparin Continue to monitor H&H and platelets  Oswald Hillock, PharmD, BCPS Clinical Pharmacist 05/03/2019 2:50 PM

## 2019-05-03 NOTE — Progress Notes (Addendum)
[facility] Progress Note Patient Name: Ireri Newbrough DOB: 07-06-1941 MRN: [id number]   Date of Service  05/03/2019  HPI/Events of Note  78/F with CAD, HTN, CKD IV, brought in due to shortness of breath. She required BIPAP, placed on heparin gtt for NSTEMI.    ABG 7.44/34/64 Crea 2.97 EKG sinus rhythm, St depression in the lateral leads. SARS CoV neg BNP 1860 Troponin 917 CXR - Interval patchy atelectasis, alveolar edema or pneumonia in both lungs. Stable cardiomegaly with interval changes of congestive heart failure. CT chest - Large bilateral pleural effusions.Cardiomegaly.  Diffuse coronary artery disease.  Bilateral airspace opacities, most confluent in the lower lobes but also patchy opacities in the upper lobes most compatible with multifocal pneumonia.  There may be a component of pulmonary edema in the background.  Borderline sized mediastinal lymph nodes, likely reactive. BP 143/69, HR 69, RR 19, O2 sats 94% on nasal cannula alone.   eICU Interventions  A> Acute respiratory failure with hypoxia Acute CHF exacerbation Pulmonary edema DM  Plan> Continue BIPAP prn.  She is lying flat now and is not in distress. Continue heparin gtt. Continue to trend troponins and H&H.   Diurese with Lasix and monitor I&Os. Continue ASA, losartan and statin. Monitor off antibiotics. Insulin for glucose control. Pt is on heparin gtt. No infication for GI prophylaxis.      Intervention Category Evaluation Type: New Patient Evaluation  Elsie Lincoln 05/03/2019, 3:49 AM

## 2019-05-04 ENCOUNTER — Inpatient Hospital Stay: Payer: Medicare Other

## 2019-05-04 LAB — CBC
HCT: 25.1 % — ABNORMAL LOW (ref 36.0–46.0)
Hemoglobin: 8.2 g/dL — ABNORMAL LOW (ref 12.0–15.0)
MCH: 29.8 pg (ref 26.0–34.0)
MCHC: 32.7 g/dL (ref 30.0–36.0)
MCV: 91.3 fL (ref 80.0–100.0)
Platelets: 428 10*3/uL — ABNORMAL HIGH (ref 150–400)
RBC: 2.75 MIL/uL — ABNORMAL LOW (ref 3.87–5.11)
RDW: 15.4 % (ref 11.5–15.5)
WBC: 5.8 10*3/uL (ref 4.0–10.5)
nRBC: 0 % (ref 0.0–0.2)

## 2019-05-04 LAB — BASIC METABOLIC PANEL
Anion gap: 10 (ref 5–15)
BUN: 38 mg/dL — ABNORMAL HIGH (ref 8–23)
CO2: 22 mmol/L (ref 22–32)
Calcium: 8.6 mg/dL — ABNORMAL LOW (ref 8.9–10.3)
Chloride: 107 mmol/L (ref 98–111)
Creatinine, Ser: 2.6 mg/dL — ABNORMAL HIGH (ref 0.44–1.00)
GFR calc Af Amer: 19 mL/min — ABNORMAL LOW (ref >60)
GFR calc non Af Amer: 17 mL/min — ABNORMAL LOW (ref >60)
Glucose, Bld: 151 mg/dL — ABNORMAL HIGH (ref 70–99)
Potassium: 4.3 mmol/L (ref 3.5–5.1)
Sodium: 139 mmol/L (ref 135–145)

## 2019-05-04 LAB — TYPE AND SCREEN
ABO/RH(D): A POS
Antibody Screen: NEGATIVE
Unit division: 0

## 2019-05-04 LAB — ECHOCARDIOGRAM COMPLETE
Height: 62 in
Weight: 2955.93 oz

## 2019-05-04 LAB — BPAM RBC
Blood Product Expiration Date: 202009262359
ISSUE DATE / TIME: 202009050043
Unit Type and Rh: 6200

## 2019-05-04 LAB — HEPARIN LEVEL (UNFRACTIONATED)
Heparin Unfractionated: 0.78 IU/mL — ABNORMAL HIGH (ref 0.30–0.70)
Heparin Unfractionated: 0.83 IU/mL — ABNORMAL HIGH (ref 0.30–0.70)

## 2019-05-04 LAB — GLUCOSE, CAPILLARY
Glucose-Capillary: 136 mg/dL — ABNORMAL HIGH (ref 70–99)
Glucose-Capillary: 203 mg/dL — ABNORMAL HIGH (ref 70–99)
Glucose-Capillary: 205 mg/dL — ABNORMAL HIGH (ref 70–99)

## 2019-05-04 NOTE — Consult Note (Signed)
ANTICOAGULATION CONSULT NOTE -   Pharmacy Consult for Heparin Infusion Dosing and monitoring  Indication: chest pain/ACS  Allergies  Allergen Reactions  . Iodinated Diagnostic Agents Shortness Of Breath    Only with nuclear stress test per pt Only with nuclear stress test per pt   . Penicillins Rash    Rash at injection site. Pt states she can take Amoxicillin & Keflex   . Escitalopram   . Lithium   . Erythromycin Nausea Only and Nausea And Vomiting    Other reaction(s): Other (See Comments) dehydration     Patient Measurements: Height: 5\' 1"  (154.9 cm) Weight: 181 lb 11.2 oz (82.4 kg) IBW/kg (Calculated) : 47.8 Heparin Dosing Weight: 68 Kg  Vital Signs: Temp: 97.6 F (36.4 C) (09/06 0749) Temp Source: Oral (09/06 0749) BP: 152/62 (09/06 0749) Pulse Rate: 64 (09/06 0749)  Labs: Recent Labs    05/02/19 1232 05/02/19 1513 05/02/19 2103  05/03/19 0449 05/03/19 1355 05/03/19 2221 05/04/19 0659  HGB 7.1*  --  6.7*  --  8.1*  --   --  8.2*  HCT 22.4*  --  20.7*  --  25.3*  --   --  25.1*  PLT 487*  --  465*  --  458*  --   --  428*  APTT  --  25  --   --   --   --   --   --   LABPROT  --  13.3  --   --   --   --   --   --   INR  --  1.0  --   --   --   --   --   --   HEPARINUNFRC  --   --   --    < > 0.10* 0.57 0.46 0.78*  CREATININE 2.97*  --   --   --  2.76*  --   --  2.60*  TROPONINIHS 923* 917*  --   --   --   --   --   --    < > = values in this interval not displayed.    Estimated Creatinine Clearance: 16.8 mL/min (A) (by C-G formula based on SCr of 2.6 mg/dL (H)).   Medical History: Past Medical History:  Diagnosis Date  . Arthritis   . Depression    controlled  . Diabetes mellitus without complication ([place])    managed well;   Marland Kitchen GERD (gastroesophageal reflux disease)   . Hepatitis   . HLD (hyperlipidemia)   . Hypertension    somewhat controlled  . 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 Polymyalgia rheumatica ([place])   .  Sleep apnea   . Stroke [facility])     Assessment: Pharmacy consulted for heparin infusion dosing and monitoring in 78 yo female admitted with respiratory distress and elevated troponin.  CrCl ~15 mL/min   Hgb: 7.1    HCT: 22.4 . No anticoagulants prior to admission.   Troponin: 923   9/5 @ 0449 HL = 0.10, subtherapeutic, confirmed w/ RN no problems w/ infusion 9/5 @ 1355 HL 0.57  9/5 @ 2221 HL 0.46, therapeutic x 2 9/6 @ 0659 HL 0.78 decrease rate to 950 units/hr  Goal of Therapy:  Heparin level 0.3-0.7 units/ml Monitor platelets by anticoagulation protocol: Yes   Plan:  Heparin level supratherapeutic.  Decrease heparin infusion to 950 units/hr Check anti-Xa level in 8 hours and CBC daily while on  heparin Continue to monitor H&H and platelets  Oswald Hillock, PharmD, BCPS Clinical Pharmacist 05/04/2019 8:18 AM

## 2019-05-04 NOTE — Consult Note (Signed)
ANTICOAGULATION CONSULT NOTE -   Pharmacy Consult for Heparin Infusion Dosing and monitoring  Indication: chest pain/ACS  Allergies  Allergen Reactions  . Iodinated Diagnostic Agents Shortness Of Breath    Only with nuclear stress test per pt Only with nuclear stress test per pt   . Penicillins Rash    Rash at injection site. Pt states she can take Amoxicillin & Keflex   . Escitalopram   . Lithium   . Erythromycin Nausea Only and Nausea And Vomiting    Other reaction(s): Other (See Comments) dehydration     Patient Measurements: Height: 5\' 1"  (154.9 cm) Weight: 181 lb 9.6 oz (82.4 kg) IBW/kg (Calculated) : 47.8 Heparin Dosing Weight: 68 Kg  Vital Signs: Temp: 97.7 F (36.5 C) (09/05 1932) Temp Source: Oral (09/05 1932) BP: 145/60 (09/05 1932) Pulse Rate: 72 (09/05 2001)  Labs: Recent Labs    05/02/19 1232 05/02/19 1513 05/02/19 2103 05/03/19 0449 05/03/19 1355 05/03/19 2221  HGB 7.1*  --  6.7* 8.1*  --   --   HCT 22.4*  --  20.7* 25.3*  --   --   PLT 487*  --  465* 458*  --   --   APTT  --  25  --   --   --   --   LABPROT  --  13.3  --   --   --   --   INR  --  1.0  --   --   --   --   HEPARINUNFRC  --   --   --  0.10* 0.57 0.46  CREATININE 2.97*  --   --  2.76*  --   --   TROPONINIHS 923* 917*  --   --   --   --     Estimated Creatinine Clearance: 15.8 mL/min (A) (by C-G formula based on SCr of 2.76 mg/dL (H)).   Medical History: Past Medical History:  Diagnosis Date  . Arthritis   . Depression    controlled  . Diabetes mellitus without complication ([place])    managed well;   Marland Kitchen GERD (gastroesophageal reflux disease)   . Hepatitis   . HLD (hyperlipidemia)   . Hypertension    somewhat controlled  . 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 Polymyalgia rheumatica ([place])   . Sleep apnea   . Stroke [facility])     Assessment: Pharmacy consulted for heparin infusion dosing and monitoring in 78 yo female admitted with respiratory  distress and elevated troponin.  CrCl ~15 mL/min   Hgb: 7.1    HCT: 22.4 . No anticoagulants prior to admission.   Troponin: 923   9/5 @ 0449 HL = 0.10, subtherapeutic, confirmed w/ RN no problems w/ infusion 9/5 @ 1355 HL 0.57  9/5 @ 2221 HL 0.46, therapeutic x 2  Goal of Therapy:  Heparin level 0.3-0.7 units/ml Monitor platelets by anticoagulation protocol: Yes   Plan:  Heparin level therapeutic.  Continue heparin infusion at 1000 units/hr Check anti-Xa level and CBC daily while on heparin Continue to monitor H&H and platelets  Ena Dawley, PharmD Clinical Pharmacist 05/04/2019 12:34 AM

## 2019-05-04 NOTE — Consult Note (Signed)
Reason for Consult:*Elevated troponin shortness of breath Referring Physician: Dr. Stark Jock hospitalist Dr. Netty Starring primary pico  Holly Conner is an 78 y.o. female.  HPI: Patient 78 year old female coronary disease hypertension diabetes chronic renal sufficiency depression sent from nursing home complains of shortness of breath going on since 2448 hrs. denied any chest pain.  Patient reportedly have hypoxic with oxygen sats in the 70s.  Patient was placed on BiPAP with significant improvement in oxygenation to the 90s.  Patient was found to have elevated troponin after serial studies EKG was unremarkable with an old left bundle branch block.  Patient had elevated BNP over 1800.  Patient was COVID negative chest x-ray showed patchy atelectasis patient had significant interval changes suggestive of congestive heart failure slightly hypothermic CAT scan also was equivocal patient had significant provement on BiPAP brought to the emergency room now here for further assessment evaluation.  Patient had significant mimic symptoms.  Patient steadfastly denied any significant chest pain now here for continued therapy  Past Medical History:  Diagnosis Date  . Arthritis   . Depression    controlled  . Diabetes mellitus without complication ([place])    managed well;   Marland Kitchen GERD (gastroesophageal reflux disease)   . Hepatitis   . HLD (hyperlipidemia)   . Hypertension    somewhat controlled  . 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 Polymyalgia rheumatica ([place])   . Sleep apnea   . Stroke [facility])     Past Surgical History:  Procedure Laterality Date  . ABDOMINAL HYSTERECTOMY    . BILATERAL SALPINGOOPHORECTOMY    . ESOPHAGEAL DILATION    . OOPHORECTOMY    . REDUCTION MAMMAPLASTY Bilateral 2003  . REPLACEMENT TOTAL KNEE Right   . TEE WITHOUT CARDIOVERSION N/A 03/31/2019   Procedure: TRANSESOPHAGEAL ECHOCARDIOGRAM (TEE);  Surgeon: Corey Skains, MD;  Location: [facility] ORS;  Service:  Cardiovascular;  Laterality: N/A;  . TONSILLECTOMY AND ADENOIDECTOMY      Family History  Problem Relation Age of Onset  . CAD Brother   . Kidney cancer Mother   . Breast cancer Cousin     Social History:  reports that she has never smoked. She has never used smokeless tobacco. She reports that she does not drink alcohol or use drugs.  Allergies:  Allergies  Allergen Reactions  . Iodinated Diagnostic Agents Shortness Of Breath    Only with nuclear stress test per pt Only with nuclear stress test per pt   . Penicillins Rash    Rash at injection site. Pt states she can take Amoxicillin & Keflex   . Escitalopram   . Lithium   . Erythromycin Nausea Only and Nausea And Vomiting    Other reaction(s): Other (See Comments) dehydration     Medications: I have reviewed the patient's current medications.  Results for orders placed or performed during the hospital encounter of 05/02/19 (from the past 48 hour(s))  Blood gas, arterial     Status: Abnormal   Collection Time: 05/02/19  5:23 PM  Result Value Ref Range   FIO2 0.32    Delivery systems NASAL CANNULA    pH, Arterial 7.44 7.350 - 7.450   pCO2 arterial 34 32.0 - 48.0 mmHg   pO2, Arterial 64 (L) 83.0 - 108.0 mmHg   Bicarbonate 23.1 20.0 - 28.0 mmol/L   Acid-base deficit 0.5 0.0 - 2.0 mmol/L   O2 Saturation 92.9 %   Patient temperature 37.0    Collection site  RIGHT RADIAL    Sample type ARTERIAL DRAW    Allens test (pass/fail) PASS PASS    Comment: Performed at [facility], [address] [place], [place] [address]  CBC     Status: Abnormal   Collection Time: 05/02/19  9:03 PM  Result Value Ref Range   WBC 8.6 4.0 - 10.5 K/uL   RBC 2.23 (L) 3.87 - 5.11 MIL/uL   Hemoglobin 6.7 (L) 12.0 - 15.0 g/dL   HCT 20.7 (L) 36.0 - 46.0 %   MCV 92.8 80.0 - 100.0 fL   MCH 30.0 26.0 - 34.0 pg   MCHC 32.4 30.0 - 36.0 g/dL   RDW 15.1 11.5 - 15.5 %   Platelets 465 (H) 150 - 400 K/uL   nRBC 0.0 0.0 - 0.2 %    Comment:  Performed at [facility], [address] [place], [place] [address]  Sample to Blood Bank     Status: None   Collection Time: 05/02/19  9:04 PM  Result Value Ref Range   Blood Bank Specimen SAMPLE AVAILABLE FOR TESTING    Sample Expiration      05/05/2019,2359 Performed at [facility], [address] [place], [place] [address]   Type and screen [place]     Status: None   Collection Time: 05/02/19  9:04 PM  Result Value Ref Range   ABO/RH(D) A POS    Antibody Screen NEG    Sample Expiration 05/05/2019,2359    Unit Number [id number]    Blood Component Type RED CELLS,LR    Unit division 00    Status of Unit ISSUED,FINAL    Transfusion Status OK TO TRANSFUSE    Crossmatch Result      Compatible Performed at [facility], [address] [place], [place] [address]   Prepare RBC     Status: None   Collection Time: 05/02/19  9:17 PM  Result Value Ref Range   Order Confirmation      ORDER PROCESSED BY BLOOD BANK Performed at [facility], [address] [place], [place] [address]   Glucose, capillary     Status: None   Collection Time: 05/02/19 10:27 PM  Result Value Ref Range   Glucose-Capillary 82 70 - 99 mg/dL  ABO/Rh     Status: None   Collection Time: 05/02/19 10:28 PM  Result Value Ref Range   ABO/RH(D)      A POS Performed at [facility], [address] [place], [place] [address]   MRSA PCR Screening     Status: None   Collection Time: 05/03/19  3:15 AM   Specimen: Nasopharyngeal  Result Value Ref Range   MRSA by PCR NEGATIVE NEGATIVE    Comment:        The GeneXpert MRSA Assay (FDA approved for NASAL specimens only), is one component of a comprehensive MRSA colonization surveillance program. It is not intended to diagnose MRSA infection nor to guide or monitor treatment for MRSA infections. Performed at [facility], [address] [place], [place] [address]    Glucose, capillary     Status: Abnormal   Collection Time: 05/03/19  3:35 AM  Result Value Ref Range   Glucose-Capillary 63 (L) 70 - 99 mg/dL  Glucose, capillary     Status: Abnormal   Collection Time: 05/03/19  3:42 AM  Result Value Ref Range   Glucose-Capillary 61 (L) 70 - 99 mg/dL  Glucose, capillary  Status: Abnormal   Collection Time: 05/03/19  4:01 AM  Result Value Ref Range   Glucose-Capillary 125 (H) 70 - 99 mg/dL  CBC     Status: Abnormal   Collection Time: 05/03/19  4:49 AM  Result Value Ref Range   WBC 7.5 4.0 - 10.5 K/uL   RBC 2.73 (L) 3.87 - 5.11 MIL/uL   Hemoglobin 8.1 (L) 12.0 - 15.0 g/dL   HCT 25.3 (L) 36.0 - 46.0 %   MCV 92.7 80.0 - 100.0 fL   MCH 29.7 26.0 - 34.0 pg   MCHC 32.0 30.0 - 36.0 g/dL   RDW 15.3 11.5 - 15.5 %   Platelets 458 (H) 150 - 400 K/uL   nRBC 0.0 0.0 - 0.2 %    Comment: Performed at [facility], [address] [place], [place] [id number]  Basic metabolic panel     Status: Abnormal   Collection Time: 05/03/19  4:49 AM  Result Value Ref Range   Sodium 141 135 - 145 mmol/L   Potassium 3.9 3.5 - 5.1 mmol/L   Chloride 110 98 - 111 mmol/L   CO2 21 (L) 22 - 32 mmol/L   Glucose, Bld 102 (H) 70 - 99 mg/dL   BUN 40 (H) 8 - 23 mg/dL   Creatinine, Ser 2.76 (H) 0.44 - 1.00 mg/dL   Calcium 8.7 (L) 8.9 - 10.3 mg/dL   GFR calc non Af Amer 16 (L) >60 mL/min   GFR calc Af Amer 18 (L) >60 mL/min   Anion gap 10 5 - 15    Comment: Performed at [facility], [address] [place], [place] [address]  Magnesium     Status: None   Collection Time: 05/03/19  4:49 AM  Result Value Ref Range   Magnesium 2.0 1.7 - 2.4 mg/dL    Comment: Performed at [facility], [address] [place], [place] [address]  Phosphorus     Status: None   Collection Time: 05/03/19  4:49 AM  Result Value Ref Range   Phosphorus 4.5 2.5 - 4.6 mg/dL    Comment: Performed at [facility], [address] [place], [place] [address]   Hemoglobin A1c     Status: Abnormal   Collection Time: 05/03/19  4:49 AM  Result Value Ref Range   Hgb A1c MFr Bld 6.0 (H) 4.8 - 5.6 %    Comment: (NOTE) Pre diabetes:          5.7%-6.4% Diabetes:              >6.4% Glycemic control for   <7.0% adults with diabetes    Mean Plasma Glucose 125.5 mg/dL    Comment: Performed at [facility], [place] [address], [place], [place] [address]  Heparin level (unfractionated)     Status: Abnormal   Collection Time: 05/03/19  4:49 AM  Result Value Ref Range   Heparin Unfractionated 0.10 (L) 0.30 - 0.70 IU/mL    Comment: (NOTE) If heparin results are below expected values, and patient dosage has  been confirmed, suggest follow up testing of antithrombin III levels. Performed at [facility], [address] [place], [place] [address]   Glucose, capillary     Status: Abnormal   Collection Time: 05/03/19  7:50 AM  Result Value Ref Range   Glucose-Capillary 62 (L) 70 - 99 mg/dL  Glucose, capillary     Status: Abnormal   Collection Time: 05/03/19 11:52 AM  Result Value Ref Range  Glucose-Capillary 107 (H) 70 - 99 mg/dL  Heparin level (unfractionated)     Status: None   Collection Time: 05/03/19  1:55 PM  Result Value Ref Range   Heparin Unfractionated 0.57 0.30 - 0.70 IU/mL    Comment: (NOTE) If heparin results are below expected values, and patient dosage has  been confirmed, suggest follow up testing of antithrombin III levels. Performed at [facility], [address] [place], [place] [address]   Glucose, capillary     Status: Abnormal   Collection Time: 05/03/19  4:53 PM  Result Value Ref Range   Glucose-Capillary 231 (H) 70 - 99 mg/dL  Glucose, capillary     Status: Abnormal   Collection Time: 05/03/19  8:03 PM  Result Value Ref Range   Glucose-Capillary 228 (H) 70 - 99 mg/dL   Comment 1 Notify RN    Comment 2 Document in Chart   Heparin level (unfractionated)     Status: None   Collection Time:  05/03/19 10:21 PM  Result Value Ref Range   Heparin Unfractionated 0.46 0.30 - 0.70 IU/mL    Comment: (NOTE) If heparin results are below expected values, and patient dosage has  been confirmed, suggest follow up testing of antithrombin III levels. Performed at [facility], [address] [place], [place] [address]   CBC     Status: Abnormal   Collection Time: 05/04/19  6:59 AM  Result Value Ref Range   WBC 5.8 4.0 - 10.5 K/uL   RBC 2.75 (L) 3.87 - 5.11 MIL/uL   Hemoglobin 8.2 (L) 12.0 - 15.0 g/dL   HCT 25.1 (L) 36.0 - 46.0 %   MCV 91.3 80.0 - 100.0 fL   MCH 29.8 26.0 - 34.0 pg   MCHC 32.7 30.0 - 36.0 g/dL   RDW 15.4 11.5 - 15.5 %   Platelets 428 (H) 150 - 400 K/uL   nRBC 0.0 0.0 - 0.2 %    Comment: Performed at [facility], [address] [place], [place] [id number]  Basic metabolic panel     Status: Abnormal   Collection Time: 05/04/19  6:59 AM  Result Value Ref Range   Sodium 139 135 - 145 mmol/L   Potassium 4.3 3.5 - 5.1 mmol/L   Chloride 107 98 - 111 mmol/L   CO2 22 22 - 32 mmol/L   Glucose, Bld 151 (H) 70 - 99 mg/dL   BUN 38 (H) 8 - 23 mg/dL   Creatinine, Ser 2.60 (H) 0.44 - 1.00 mg/dL   Calcium 8.6 (L) 8.9 - 10.3 mg/dL   GFR calc non Af Amer 17 (L) >60 mL/min   GFR calc Af Amer 19 (L) >60 mL/min   Anion gap 10 5 - 15    Comment: Performed at [facility], [place], [place] [address]  Heparin level (unfractionated)     Status: Abnormal   Collection Time: 05/04/19  6:59 AM  Result Value Ref Range   Heparin Unfractionated 0.78 (H) 0.30 - 0.70 IU/mL    Comment: (NOTE) If heparin results are below expected values, and patient dosage has  been confirmed, suggest follow up testing of antithrombin III levels. Performed at [facility], [address] [place], [place] [address]   Glucose, capillary     Status: Abnormal   Collection Time: 05/04/19  7:50 AM  Result Value Ref Range   Glucose-Capillary 136 (H) 70  - 99 mg/dL  Glucose, capillary     Status:  Abnormal   Collection Time: 05/04/19 11:26 AM  Result Value Ref Range   Glucose-Capillary 203 (H) 70 - 99 mg/dL    Dg Chest 1 View  Result Date: 05/04/2019 CLINICAL DATA:  Shortness of breath. Stage IV renal failure. EXAM: CHEST  1 VIEW COMPARISON:  May 02, 2019 FINDINGS: Enlarged cardiac silhouette. Calcific atherosclerotic disease of the aorta. Small bilateral pleural effusions. Mild mixed pattern pulmonary edema. Osseous structures are without acute abnormality. Soft tissues are grossly normal. IMPRESSION: 1. Enlarged cardiac silhouette. 2. Small bilateral pleural effusions. 3. Mild mixed pattern pulmonary edema. Electronically Signed   By: Fidela Salisbury M.D.   On: 05/04/2019 13:20   Ct Chest Wo Contrast  Result Date: 05/02/2019 CLINICAL DATA:  Chest pain EXAM: CT CHEST WITHOUT CONTRAST TECHNIQUE: Multidetector CT imaging of the chest was performed following the standard protocol without IV contrast. COMPARISON:  12/21/2012 FINDINGS: Cardiovascular: Heart is enlarged. Diffuse coronary artery and aortic calcifications. No evidence of aortic aneurysm. Mediastinum/Nodes: Borderline scattered mediastinal lymph nodes. Low right paratracheal lymph node has a short axis diameter of 11 mm. Other similarly sized or smaller scattered mediastinal lymph nodes. No visible hilar adenopathy. No axial adenopathy. Isthmic thyroid nodule measures 2.4 by 2.0 cm. This has enlarged slightly since 2014 study when this measured 2.0 x 1.6 cm. Lungs/Pleura: They are are large bilateral pleural effusions. Extensive airspace disease in both lungs, most confluent in the lower lobes but also noted in both upper lobes compatible with multifocal pneumonia. Background of interstitial thickening and ground-glass opacities could reflect underlying edema. Upper Abdomen: Imaging into the upper abdomen shows no acute findings. Musculoskeletal: Chest wall soft tissues are unremarkable.  No acute bony abnormality. IMPRESSION: Large bilateral pleural effusions. Cardiomegaly.  Diffuse coronary artery disease. Bilateral airspace opacities, most confluent in the lower lobes but also patchy opacities in the upper lobes most compatible with multifocal pneumonia. There may be a component of pulmonary edema in the background. Borderline sized mediastinal lymph nodes, likely reactive. Aortic Atherosclerosis (ICD10-I70.0). Electronically Signed   By: Rolm Baptise M.D.   On: 05/02/2019 21:53    Review of Systems  Constitutional: Positive for diaphoresis and malaise/fatigue.  HENT: Positive for congestion.   Eyes: Negative.   Respiratory: Positive for shortness of breath.   Cardiovascular: Positive for orthopnea and PND.  Gastrointestinal: Negative.   Genitourinary: Negative.   Musculoskeletal: Negative.   Skin: Negative.   Neurological: Positive for weakness.  Endo/Heme/Allergies: Negative.   Psychiatric/Behavioral: Negative.    Blood pressure (!) 144/56, pulse 74, temperature 97.9 F (36.6 C), temperature source Oral, resp. rate 16, height 5\' 1"  (1.549 m), weight 82.4 kg, SpO2 98 %. Physical Exam  Nursing note and vitals reviewed. Constitutional: She is oriented to person, place, and time. She appears well-developed and well-nourished.  HENT:  Head: Normocephalic and atraumatic.  Eyes: Pupils are equal, round, and reactive to light. Conjunctivae and EOM are normal.  Neck: Normal range of motion. Neck supple.  Cardiovascular: Normal rate and regular rhythm.  Murmur heard. Respiratory: Effort normal and breath sounds normal.  GI: Soft. Bowel sounds are normal.  Musculoskeletal: Normal range of motion.  Neurological: She is alert and oriented to person, place, and time. She has normal reflexes.  Skin: Skin is warm and dry.  Psychiatric: She has a normal mood and affect.    Assessment/Plan: Respiratory distress Elevated troponin Pneumonia Known coronary  disease Hypertension Diabetes type 2 Chronic renal insufficiency IV Polymyalgia rheumatica Shortness of breath . Plan Agree with admit  to telemetry Continue supplemental oxygen for acute hypoxic respiratory failure Broad-spectrum antibiotic therapy for possible pneumonia BiPAP to help with respiratory support Demand ischemia doubt non-STEMI Agree with short-term heparin therapy 24 to 48 hours Echocardiogram for assessment of left ventricular function Maximized anemia to 8 and 24 H&H Recommend GI evaluation for possible lower GI bleeding Agree with critical care and pulmonary input Maintain diabetes management and control with insulin Significant renal sufficiency involve nephrology DVT prophylaxis   Deidra Spease D Kyli Sorter 05/04/2019, 3:28 PM

## 2019-05-04 NOTE — Progress Notes (Signed)
[facility] [place] NAME: Holly Conner    MR#:  [id number]  DATE OF BIRTH:  [birth year]  SUBJECTIVE:  Patient doing much better this am Denies CP I spoke with patient's POA  REVIEW OF SYSTEMS:     Review of Systems  Constitutional: Negative for fever, chills weight loss HENT: Negative for ear pain, nosebleeds, congestion, facial swelling, rhinorrhea, neck pain, neck stiffness and ear discharge.   Respiratory: Negative for cough, shortness of breath, wheezing  Cardiovascular: Negative for chest pain, palpitations and leg swelling.  Gastrointestinal: Negative for heartburn, abdominal pain, vomiting, diarrhea or consitpation Genitourinary: Negative for dysuria, urgency, frequency, hematuria Musculoskeletal: Negative for back pain or joint pain Neurological: Negative for dizziness, seizures, syncope, focal weakness,  numbness and headaches.  Hematological: Does not bruise/bleed easily.  Psychiatric/Behavioral: Negative for hallucinations,  dysphoric mood +short term memory loss    Tolerating Diet: yes      DRUG ALLERGIES:   Allergies  Allergen Reactions  . Iodinated Diagnostic Agents Shortness Of Breath    Only with nuclear stress test per pt Only with nuclear stress test per pt   . Penicillins Rash    Rash at injection site. Pt states she can take Amoxicillin & Keflex   . Escitalopram   . Lithium   . Erythromycin Nausea Only and Nausea And Vomiting    Other reaction(s): Other (See Comments) dehydration     VITALS:  Blood pressure (!) 152/62, pulse 64, temperature 97.6 F (36.4 C), temperature source Oral, resp. rate 17, height 5\' 1"  (1.549 m), weight 82.4 kg, SpO2 100 %.  PHYSICAL EXAMINATION:  Constitutional: Appears well-developed and well-nourished. No distress. HENT: Normocephalic. Marland Kitchen Oropharynx is clear and moist.  Eyes: Conjunctivae and EOM are normal. PERRLA, no scleral icterus.  Neck: Normal ROM. Neck  supple. No JVD. No tracheal deviation. CVS: RRR, S1/S2 +, no murmurs, no gallops, no carotid bruit.  Pulmonary: Effort and breath sounds normal, no stridor, rhonchi, wheezes, rales.  Abdominal: Soft. BS +,  no distension, tenderness, rebound or guarding.  Musculoskeletal: Normal range of motion. No edema and no tenderness.  Neuro: Alert. CN 2-12 grossly intact. No focal deficits. Skin: Skin is warm and dry. No rash noted. Psychiatric: Normal mood and affect.      LABORATORY PANEL:   CBC Recent Labs  Lab 05/04/19 0659  WBC 5.8  HGB 8.2*  HCT 25.1*  PLT 428*   ------------------------------------------------------------------------------------------------------------------  Chemistries  Recent Labs  Lab 05/03/19 0449 05/04/19 0659  NA 141 139  K 3.9 4.3  CL 110 107  CO2 21* 22  GLUCOSE 102* 151*  BUN 40* 38*  CREATININE 2.76* 2.60*  CALCIUM 8.7* 8.6*  MG 2.0  --    ------------------------------------------------------------------------------------------------------------------  Cardiac Enzymes No results for input(s): TROPONINI in the last 168 hours. ------------------------------------------------------------------------------------------------------------------  RADIOLOGY:  Ct Head Wo Contrast  Result Date: 05/02/2019 CLINICAL DATA:  from [facility] with Respiratory distress. PT found on the ground by EMS with oxygen saturation of 70% on RA. Oxygen saturation increased to 100% on NRB but WOB did not improve. EXAM: CT HEAD WITHOUT CONTRAST CT CERVICAL SPINE WITHOUT CONTRAST TECHNIQUE: Multidetector CT imaging of the head and cervical spine was performed following the standard protocol without intravenous contrast. Multiplanar CT image reconstructions of the cervical spine were also generated. COMPARISON:  04/03/2019 FINDINGS: CT HEAD FINDINGS Brain: There is central and cortical atrophy. Periventricular white matter changes are consistent with small vessel  disease.  There is no intra or extra-axial fluid collection or mass lesion. The basilar cisterns and ventricles have a normal appearance. There is no CT evidence for acute infarction or hemorrhage. Vascular: There is minimal atheromatous sclerotic calcification of the internal carotid arteries. No hyperdense vessels. Skull: Normal. Negative for fracture or focal lesion. Sinuses/Orbits: No acute finding. Other: None CT CERVICAL SPINE FINDINGS Alignment: There is loss of cervical lordosis. This may be secondary to splinting, soft tissue injury, or positioning. Otherwise alignment is normal. Skull base and vertebrae: No acute fracture. No primary bone lesion or focal pathologic process. Soft tissues and spinal canal: No prevertebral fluid or swelling. No visible canal hematoma. Disc levels: Disc height loss and uncovertebral spurring in the mid cervical spine. Upper chest: Partially imaged bilateral pleural effusion. There is question of soft tissue foreign body within the proximal trachea. Other: There is atherosclerotic calcification of the carotid arteries. IMPRESSION: 1. No evidence for acute intracranial abnormality. 2. Atrophy and small vessel disease. 3. Loss of cervical lordosis. 4. No evidence for acute cervical spine abnormality. 5. Question of soft tissue foreign body within the proximal trachea. 6. Partially imaged bilateral pleural effusion. 7. Consider CT of the chest for further evaluation. These results were called by telephone at the time of interpretation on 05/02/2019 at 2:55 pm to Dr. Blake Divine , who verbally acknowledged these results. Electronically Signed   By: Nolon Nations M.D.   On: 05/02/2019 14:58   Ct Chest Wo Contrast  Result Date: 05/02/2019 CLINICAL DATA:  Chest pain EXAM: CT CHEST WITHOUT CONTRAST TECHNIQUE: Multidetector CT imaging of the chest was performed following the standard protocol without IV contrast. COMPARISON:  12/21/2012 FINDINGS: Cardiovascular: Heart is enlarged.  Diffuse coronary artery and aortic calcifications. No evidence of aortic aneurysm. Mediastinum/Nodes: Borderline scattered mediastinal lymph nodes. Low right paratracheal lymph node has a short axis diameter of 11 mm. Other similarly sized or smaller scattered mediastinal lymph nodes. No visible hilar adenopathy. No axial adenopathy. Isthmic thyroid nodule measures 2.4 by 2.0 cm. This has enlarged slightly since 2014 study when this measured 2.0 x 1.6 cm. Lungs/Pleura: They are are large bilateral pleural effusions. Extensive airspace disease in both lungs, most confluent in the lower lobes but also noted in both upper lobes compatible with multifocal pneumonia. Background of interstitial thickening and ground-glass opacities could reflect underlying edema. Upper Abdomen: Imaging into the upper abdomen shows no acute findings. Musculoskeletal: Chest wall soft tissues are unremarkable. No acute bony abnormality. IMPRESSION: Large bilateral pleural effusions. Cardiomegaly.  Diffuse coronary artery disease. Bilateral airspace opacities, most confluent in the lower lobes but also patchy opacities in the upper lobes most compatible with multifocal pneumonia. There may be a component of pulmonary edema in the background. Borderline sized mediastinal lymph nodes, likely reactive. Aortic Atherosclerosis (ICD10-I70.0). Electronically Signed   By: Rolm Baptise M.D.   On: 05/02/2019 21:53   Ct Cervical Spine Wo Contrast  Result Date: 05/02/2019 CLINICAL DATA:  from [facility] with Respiratory distress. PT found on the ground by EMS with oxygen saturation of 70% on RA. Oxygen saturation increased to 100% on NRB but WOB did not improve. EXAM: CT HEAD WITHOUT CONTRAST CT CERVICAL SPINE WITHOUT CONTRAST TECHNIQUE: Multidetector CT imaging of the head and cervical spine was performed following the standard protocol without intravenous contrast. Multiplanar CT image reconstructions of the cervical spine were also  generated. COMPARISON:  04/03/2019 FINDINGS: CT HEAD FINDINGS Brain: There is central and cortical atrophy. Periventricular white matter changes are  consistent with small vessel disease. There is no intra or extra-axial fluid collection or mass lesion. The basilar cisterns and ventricles have a normal appearance. There is no CT evidence for acute infarction or hemorrhage. Vascular: There is minimal atheromatous sclerotic calcification of the internal carotid arteries. No hyperdense vessels. Skull: Normal. Negative for fracture or focal lesion. Sinuses/Orbits: No acute finding. Other: None CT CERVICAL SPINE FINDINGS Alignment: There is loss of cervical lordosis. This may be secondary to splinting, soft tissue injury, or positioning. Otherwise alignment is normal. Skull base and vertebrae: No acute fracture. No primary bone lesion or focal pathologic process. Soft tissues and spinal canal: No prevertebral fluid or swelling. No visible canal hematoma. Disc levels: Disc height loss and uncovertebral spurring in the mid cervical spine. Upper chest: Partially imaged bilateral pleural effusion. There is question of soft tissue foreign body within the proximal trachea. Other: There is atherosclerotic calcification of the carotid arteries. IMPRESSION: 1. No evidence for acute intracranial abnormality. 2. Atrophy and small vessel disease. 3. Loss of cervical lordosis. 4. No evidence for acute cervical spine abnormality. 5. Question of soft tissue foreign body within the proximal trachea. 6. Partially imaged bilateral pleural effusion. 7. Consider CT of the chest for further evaluation. These results were called by telephone at the time of interpretation on 05/02/2019 at 2:55 pm to Dr. Blake Divine , who verbally acknowledged these results. Electronically Signed   By: Nolon Nations M.D.   On: 05/02/2019 14:58   Dg Chest Portable 1 View  Result Date: 05/02/2019 CLINICAL DATA:  Respiratory distress. Hypoxia. EXAM: PORTABLE  CHEST 1 VIEW COMPARISON:  04/03/2019. FINDINGS: Stable enlargement of the cardiac silhouette. Increased prominence of the pulmonary vasculature and interstitial markings. Interval patchy opacities at both lung bases and in the left mid and upper lung zones. Minimal patchy opacity in the right upper lung zone. No definite pleural fluid seen. Mild thoracic spine degenerative changes. IMPRESSION: 1. Interval patchy atelectasis, alveolar edema or pneumonia in both lungs. 2. Stable cardiomegaly with interval changes of congestive heart failure. Electronically Signed   By: Claudie Revering M.D.   On: 05/02/2019 13:26     ASSESSMENT AND PLAN:   78 year old female with chronic kidney disease stage IV and diabetes who presented to the emergency room due to shortness of breath.  1.  Acute hypoxic respiratory failure in the setting of multifocal pneumonia and bilateral pleural effusion/acute on chronic diastolic heart failure: She has  Been weaned off of BiPAP and on nasal cannula. Continue IV Lasix through today then transition to PO tomorrow Monitor intake and output with daily weight Outpatient CHF clinic and cardiology follow up CXR this am  COVID negative   2.  Multifocal pneumonia: Continue Omnicef D3/10  3.  Elevated troponin:  Currently on heparin drip D/w cardiology who is to see patient this am  Follow-up on echo Continue Statin and metoprolol  4.  Acute on chronic Anemia: Status post 1 unit PRBC Guaiac positive Anemia Panel consistent with chronic disease Hemoglobin stable  5.  Diabetes: Diabetes is controlled with A1c of 6.0  6.  Chronic kidney disease stage IV: Creatinine remains stable and is at baseline.   7. HTN: Continue Losrtan and  Metoprolol for better BP control   8. Short term memory loss: Will need outpatient neurology follow up.   PT consult for d/c planning and CSW consult  Management plans discussed with the patient and she is in agreement. D/w POA  CODE  STATUS: FULL  TOTAL TIME TAKING CARE OF THIS PATIENT: 36 minutes.     POSSIBLE D/C tomorrow, DEPENDING ON CLINICAL CONDITION.   Bettey Costa M.D on 05/04/2019 at 10:44 AM  Between 7am to 6pm - Pager - [phone] After 6pm go to [email or web address] - password [email or web address]  Office  [phone]  CC: Primary care physician; Dion Body, MD  Note: This dictation was prepared with Dragon dictation along with smaller phrase technology. Any transcriptional errors that result from this process are unintentional.

## 2019-05-04 NOTE — Consult Note (Signed)
ANTICOAGULATION CONSULT NOTE -   Pharmacy Consult for Heparin Infusion Dosing and monitoring  Indication: chest pain/ACS  Allergies  Allergen Reactions  . Iodinated Diagnostic Agents Shortness Of Breath    Only with nuclear stress test per pt Only with nuclear stress test per pt   . Penicillins Rash    Rash at injection site. Pt states she can take Amoxicillin & Keflex   . Escitalopram   . Lithium   . Erythromycin Nausea Only and Nausea And Vomiting    Other reaction(s): Other (See Comments) dehydration     Patient Measurements: Height: 5\' 1"  (154.9 cm) Weight: 181 lb 11.2 oz (82.4 kg) IBW/kg (Calculated) : 47.8 Heparin Dosing Weight: 68 Kg  Vital Signs: Temp: 97.9 F (36.6 C) (09/06 1526) Temp Source: Oral (09/06 1526) BP: 144/56 (09/06 1526) Pulse Rate: 74 (09/06 1526)  Labs: Recent Labs    05/02/19 1232 05/02/19 1513 05/02/19 2103 05/03/19 0449  05/03/19 2221 05/04/19 0659 05/04/19 1456  HGB 7.1*  --  6.7* 8.1*  --   --  8.2*  --   HCT 22.4*  --  20.7* 25.3*  --   --  25.1*  --   PLT 487*  --  465* 458*  --   --  428*  --   APTT  --  25  --   --   --   --   --   --   LABPROT  --  13.3  --   --   --   --   --   --   INR  --  1.0  --   --   --   --   --   --   HEPARINUNFRC  --   --   --  0.10*   < > 0.46 0.78* 0.83*  CREATININE 2.97*  --   --  2.76*  --   --  2.60*  --   TROPONINIHS 923* 917*  --   --   --   --   --   --    < > = values in this interval not displayed.    Estimated Creatinine Clearance: 16.8 mL/min (A) (by C-G formula based on SCr of 2.6 mg/dL (H)).   Medical History: Past Medical History:  Diagnosis Date  . Arthritis   . Depression    controlled  . Diabetes mellitus without complication ([place])    managed well;   Marland Kitchen GERD (gastroesophageal reflux disease)   . Hepatitis   . HLD (hyperlipidemia)   . Hypertension    somewhat controlled  . 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 Polymyalgia rheumatica ([place])   .  Sleep apnea   . Stroke [facility])     Assessment: Pharmacy consulted for heparin infusion dosing and monitoring in 78 yo female admitted with respiratory distress and elevated troponin.  CrCl ~15 mL/min   Hgb: 7.1    HCT: 22.4 . No anticoagulants prior to admission.   Troponin: 923   9/5 @ 0449 HL = 0.10, subtherapeutic, confirmed w/ RN no problems w/ infusion 9/5 @ 1355 HL 0.57  9/5 @ 2221 HL 0.46, therapeutic x 2 9/6 @ 0659 HL 0.78 decrease rate to 950 units/hr 9/6 @1500  HL 0.83, decrease rate to 800 units/hr  Goal of Therapy:  Heparin level 0.3-0.7 units/ml Monitor platelets by anticoagulation protocol: Yes   Plan:  Heparin level supratherapeutic.  Decrease heparin infusion to 800 units/hr Check anti-Xa  level in 8 hours and CBC daily while on heparin Continue to monitor H&H and platelets  Paulina Fusi, PharmD, BCPS 05/04/2019 5:45 PM

## 2019-05-04 NOTE — Progress Notes (Signed)
PT Cancellation Note  Patient Details Name: Kenda Nakasone MRN: [id number] DOB: [birth year]   Cancelled Treatment:    Reason Eval/Treat Not Completed: Medical issues which prohibited therapy(waiting for cardiology consult.)   Alanson Puls , PT DPT 05/04/2019, 10:41 AM

## 2019-05-04 NOTE — Plan of Care (Signed)
Patient continues to be confused. Not impulsive however bed alarm is on, yellow socks on, continue to reorient throughout shift.   Weaned to room air.  Problem: Education: Goal: Knowledge of General Education information will improve Description: Including pain rating scale, medication(s)/side effects and non-pharmacologic comfort measures Outcome: Not Progressing   Problem: Cardiac: Goal: Ability to achieve and maintain adequate cardiopulmonary perfusion will improve Outcome: Progressing

## 2019-05-05 LAB — CBC
HCT: 24.7 % — ABNORMAL LOW (ref 36.0–46.0)
Hemoglobin: 8 g/dL — ABNORMAL LOW (ref 12.0–15.0)
MCH: 29.9 pg (ref 26.0–34.0)
MCHC: 32.4 g/dL (ref 30.0–36.0)
MCV: 92.2 fL (ref 80.0–100.0)
Platelets: 427 10*3/uL — ABNORMAL HIGH (ref 150–400)
RBC: 2.68 MIL/uL — ABNORMAL LOW (ref 3.87–5.11)
RDW: 15.3 % (ref 11.5–15.5)
WBC: 5.8 10*3/uL (ref 4.0–10.5)
nRBC: 0 % (ref 0.0–0.2)

## 2019-05-05 LAB — BASIC METABOLIC PANEL
Anion gap: 8 (ref 5–15)
BUN: 48 mg/dL — ABNORMAL HIGH (ref 8–23)
CO2: 24 mmol/L (ref 22–32)
Calcium: 8.4 mg/dL — ABNORMAL LOW (ref 8.9–10.3)
Chloride: 104 mmol/L (ref 98–111)
Creatinine, Ser: 2.92 mg/dL — ABNORMAL HIGH (ref 0.44–1.00)
GFR calc Af Amer: 17 mL/min — ABNORMAL LOW (ref >60)
GFR calc non Af Amer: 15 mL/min — ABNORMAL LOW (ref >60)
Glucose, Bld: 203 mg/dL — ABNORMAL HIGH (ref 70–99)
Potassium: 4.4 mmol/L (ref 3.5–5.1)
Sodium: 136 mmol/L (ref 135–145)

## 2019-05-05 LAB — HEPARIN LEVEL (UNFRACTIONATED): Heparin Unfractionated: 0.59 IU/mL (ref 0.30–0.70)

## 2019-05-05 LAB — GLUCOSE, CAPILLARY
Glucose-Capillary: 197 mg/dL — ABNORMAL HIGH (ref 70–99)
Glucose-Capillary: 350 mg/dL — ABNORMAL HIGH (ref 70–99)

## 2019-05-05 MED ORDER — METOPROLOL TARTRATE 25 MG PO TABS: 25.0000 mg | ORAL_TABLET | Freq: Two times a day (BID) | ORAL | 0 refills | Status: AC

## 2019-05-05 MED ORDER — TRESIBA FLEXTOUCH 200 UNIT/ML ~~LOC~~ SOPN: 10.0000 [IU] | PEN_INJECTOR | Freq: Every day | SUBCUTANEOUS | 0 refills | Status: AC

## 2019-05-05 MED ORDER — CEFDINIR 300 MG PO CAPS: 300.0000 mg | ORAL_CAPSULE | Freq: Every day | ORAL | 0 refills | Status: AC

## 2019-05-05 NOTE — Discharge Summary (Signed)
[place] [facility] [place] [facility] [place] NAME: Holly Conner    MR#:  [id number]  DATE OF BIRTH:  12-Apr-1941  DATE OF ADMISSION:  05/02/2019 ADMITTING PHYSICIAN: Otila Back, MD  DATE OF DISCHARGE: 05/05/2019  PRIMARY CARE PHYSICIAN: Dion Body, MD    ADMISSION DIAGNOSIS:  NSTEMI (non-ST elevated myocardial infarction) ([place]) [id number] Acute systolic congestive heart failure ([place]) [I50.21] Acute respiratory failure with hypoxia ([place]) [J96.01]  DISCHARGE DIAGNOSIS:  Active Problems:   NSTEMI (non-ST elevated myocardial infarction) ([place])   Acute respiratory failure with hypoxia ([place])   SECONDARY DIAGNOSIS:   Past Medical History:  Diagnosis Date  . Arthritis   . Depression    controlled  . Diabetes mellitus without complication ([place])    managed well;   Marland Kitchen GERD (gastroesophageal reflux disease)   . Hepatitis   . HLD (hyperlipidemia)   . Hypertension    somewhat controlled  . 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 Polymyalgia rheumatica ([place])   . Sleep apnea   . Stroke [facility])     HOSPITAL COURSE:   78 year old female with chronic kidney disease stage IV and diabetes who presented to the emergency room due to shortness of breath.  1.  Acute hypoxic respiratory failure in the setting of multifocal pneumonia and bilateral pleural effusion/acute on chronic diastolic heart failure: She has been weaned off of BiPAP and nasal cannula.    COVID testing was negative   2.  Multifocal pneumonia: Continue Omnicef D 4/9  3.  Elevated troponin: Patient ruled out for ACS.  Elevated troponin due to demand ischemia.  Initially patient was placed on heparin drip.  Patient evaluated by cardiology.  Echocardiogram showed no wall motion abnormalities.  She will continue aspirin, statin and metoprolol.  4.  Acute on chronic Anemia: Status post 1 unit PRBC Anemia Panel consistent with chronic disease Hemoglobin has remained  stable  5.  Diabetes: Diabetes is controlled with A1c of 6.0 We have taken our office some of her diabetic medications and decreased dose of longer acting insulin.  She will continue sliding scale and ADA diet.  She needs close follow-up by her PCP.   6.  Chronic kidney disease stage IV: Creatinine has remained relatively stable.  Her creatinine is anywhere between 2.5-2.9.   7. HTN: Continue Losartan and  Metoprolol.  8. Short term memory loss: Will need outpatient neurology follow up. 9.  Acute on chronic diastolic heart failure: Patient was diuresed with Lasix.  She is referred to CHF clinic upon discharge.  She does not need Lasix upon discharge.  She will have follow-up with her cardiologist and at that time if indicated she will start Lasix. Weight should be monitored daily.   DISCHARGE CONDITIONS AND DIET:  Stable for discharge  Diabetic heart healthy diet  CONSULTS OBTAINED:    DRUG ALLERGIES:   Allergies  Allergen Reactions  . Iodinated Diagnostic Agents Shortness Of Breath    Only with nuclear stress test per pt Only with nuclear stress test per pt   . Penicillins Rash    Rash at injection site. Pt states she can take Amoxicillin & Keflex   . Escitalopram   . Lithium   . Erythromycin Nausea Only and Nausea And Vomiting    Other reaction(s): Other (See Comments) dehydration     DISCHARGE MEDICATIONS:   Allergies as of 05/05/2019      Reactions   Iodinated Diagnostic Agents Shortness Of Breath  Only with nuclear stress test per pt Only with nuclear stress test per pt   Penicillins Rash   Rash at injection site. Pt states she can take Amoxicillin & Keflex   Escitalopram    Lithium    Erythromycin Nausea Only, Nausea And Vomiting   Other reaction(s): Other (See Comments) dehydration      Medication List    STOP taking these medications   amLODipine 10 MG tablet Commonly known as: NORVASC   QUEtiapine 25 MG tablet Commonly known as: SEROQUEL    Victoza 18 MG/3ML Sopn Generic drug: liraglutide     TAKE these medications   aspirin EC 81 MG tablet Take 1 tablet (81 mg total) by mouth daily.   atorvastatin 80 MG tablet Commonly known as: LIPITOR Take 80 mg by mouth daily.   cefdinir 300 MG capsule Commonly known as: OMNICEF Take 1 capsule (300 mg total) by mouth daily for 3 days. Start taking on: May 06, 2019   loratadine 10 MG tablet Commonly known as: CLARITIN Take 10 mg by mouth daily as needed for allergies.   losartan 50 MG tablet Commonly known as: COZAAR Take 50 mg by mouth daily.   metoprolol tartrate 25 MG tablet Commonly known as: LOPRESSOR Take 1 tablet (25 mg total) by mouth 2 (two) times daily.   multivitamin with minerals Tabs tablet Take 1 tablet by mouth daily.   NovoLOG FlexPen 100 UNIT/ML FlexPen Generic drug: insulin aspart Inject 0-12 Units into the skin See admin instructions. Inject under the skin three times daily with meals according to sliding scale:   200 or below: 0u 201-250: 2u 251-300: 4u 301-350: 6u 351-400: 8u 401-450: 10u 450 or higher: 12u and contact physician   pyridOXINE 100 MG tablet Commonly known as: VITAMIN B-6 Take 100 mg by mouth daily.   solifenacin 10 MG tablet Commonly known as: VESICARE Take 1 tablet (10 mg total) by mouth daily.   tamsulosin 0.4 MG Caps capsule Commonly known as: FLOMAX Take 1 capsule (0.4 mg total) by mouth at bedtime.   Tyler Aas FlexTouch 200 UNIT/ML Sopn Generic drug: Insulin Degludec Inject 10 Units into the skin daily. What changed: how much to take   vitamin B-12 100 MCG tablet Commonly known as: CYANOCOBALAMIN Take 100 mcg by mouth daily.   vitamin C 1000 MG tablet Take 1,000 mg by mouth daily.         Today   CHIEF COMPLAINT:   No acute events overnight   VITAL SIGNS:  Blood pressure (!) 123/50, pulse 67, temperature 97.8 F (36.6 C), temperature source Oral, resp. rate 17, height 5\' 1"  (1.549 m),  weight 79.4 kg, SpO2 94 %.   REVIEW OF SYSTEMS:  Review of Systems  Constitutional: Negative.  Negative for chills, fever and malaise/fatigue.  HENT: Negative.  Negative for ear discharge, ear pain, hearing loss, nosebleeds and sore throat.   Eyes: Negative.  Negative for blurred vision and pain.  Respiratory: Negative.  Negative for cough, hemoptysis, shortness of breath and wheezing.   Cardiovascular: Negative.  Negative for chest pain, palpitations and leg swelling.  Gastrointestinal: Negative.  Negative for abdominal pain, blood in stool, diarrhea, nausea and vomiting.  Genitourinary: Negative.  Negative for dysuria.  Musculoskeletal: Negative.  Negative for back pain.  Skin: Negative.   Neurological: Negative for dizziness, tremors, speech change, focal weakness, seizures and headaches.  Endo/Heme/Allergies: Negative.  Does not bruise/bleed easily.  Psychiatric/Behavioral: Positive for memory loss. Negative for depression, hallucinations and suicidal ideas.  PHYSICAL EXAMINATION:  GENERAL:  78 y.o.-year-old patient lying in the bed with no acute distress.  NECK:  Supple, no jugular venous distention. No thyroid enlargement, no tenderness.  LUNGS: Normal breath sounds bilaterally, no wheezing, rales,rhonchi  No use of accessory muscles of respiration.  CARDIOVASCULAR: S1, S2 normal. No murmurs, rubs, or gallops.  ABDOMEN: Soft, non-tender, non-distended. Bowel sounds present. No organomegaly or mass.  EXTREMITIES: No pedal edema, cyanosis, or clubbing.  PSYCHIATRIC: The patient is alert and oriented x 3.  SKIN: No obvious rash, lesion, or ulcer.   DATA REVIEW:   CBC Recent Labs  Lab 05/05/19 0200  WBC 5.8  HGB 8.0*  HCT 24.7*  PLT 427*    Chemistries  Recent Labs  Lab 05/03/19 0449  05/05/19 0200  NA 141   < > 136  K 3.9   < > 4.4  CL 110   < > 104  CO2 21*   < > 24  GLUCOSE 102*   < > 203*  BUN 40*   < > 48*  CREATININE 2.76*   < > 2.92*  CALCIUM 8.7*    < > 8.4*  MG 2.0  --   --    < > = values in this interval not displayed.    Cardiac Enzymes No results for input(s): TROPONINI in the last 168 hours.  Microbiology Results  @MICRORSLT48 @  RADIOLOGY:  Dg Chest 1 View  Result Date: 05/04/2019 CLINICAL DATA:  Shortness of breath. Stage IV renal failure. EXAM: CHEST  1 VIEW COMPARISON:  May 02, 2019 FINDINGS: Enlarged cardiac silhouette. Calcific atherosclerotic disease of the aorta. Small bilateral pleural effusions. Mild mixed pattern pulmonary edema. Osseous structures are without acute abnormality. Soft tissues are grossly normal. IMPRESSION: 1. Enlarged cardiac silhouette. 2. Small bilateral pleural effusions. 3. Mild mixed pattern pulmonary edema. Electronically Signed   By: Fidela Salisbury M.D.   On: 05/04/2019 13:20      Allergies as of 05/05/2019      Reactions   Iodinated Diagnostic Agents Shortness Of Breath   Only with nuclear stress test per pt Only with nuclear stress test per pt   Penicillins Rash   Rash at injection site. Pt states she can take Amoxicillin & Keflex   Escitalopram    Lithium    Erythromycin Nausea Only, Nausea And Vomiting   Other reaction(s): Other (See Comments) dehydration      Medication List    STOP taking these medications   amLODipine 10 MG tablet Commonly known as: NORVASC   QUEtiapine 25 MG tablet Commonly known as: SEROQUEL   Victoza 18 MG/3ML Sopn Generic drug: liraglutide     TAKE these medications   aspirin EC 81 MG tablet Take 1 tablet (81 mg total) by mouth daily.   atorvastatin 80 MG tablet Commonly known as: LIPITOR Take 80 mg by mouth daily.   cefdinir 300 MG capsule Commonly known as: OMNICEF Take 1 capsule (300 mg total) by mouth daily for 3 days. Start taking on: May 06, 2019   loratadine 10 MG tablet Commonly known as: CLARITIN Take 10 mg by mouth daily as needed for allergies.   losartan 50 MG tablet Commonly known as: COZAAR Take 50 mg by  mouth daily.   metoprolol tartrate 25 MG tablet Commonly known as: LOPRESSOR Take 1 tablet (25 mg total) by mouth 2 (two) times daily.   multivitamin with minerals Tabs tablet Take 1 tablet by mouth daily.   NovoLOG FlexPen 100 UNIT/ML FlexPen  Generic drug: insulin aspart Inject 0-12 Units into the skin See admin instructions. Inject under the skin three times daily with meals according to sliding scale:   200 or below: 0u 201-250: 2u 251-300: 4u 301-350: 6u 351-400: 8u 401-450: 10u 450 or higher: 12u and contact physician   pyridOXINE 100 MG tablet Commonly known as: VITAMIN B-6 Take 100 mg by mouth daily.   solifenacin 10 MG tablet Commonly known as: VESICARE Take 1 tablet (10 mg total) by mouth daily.   tamsulosin 0.4 MG Caps capsule Commonly known as: FLOMAX Take 1 capsule (0.4 mg total) by mouth at bedtime.   Tyler Aas FlexTouch 200 UNIT/ML Sopn Generic drug: Insulin Degludec Inject 10 Units into the skin daily. What changed: how much to take   vitamin B-12 100 MCG tablet Commonly known as: CYANOCOBALAMIN Take 100 mcg by mouth daily.   vitamin C 1000 MG tablet Take 1,000 mg by mouth daily.          Management plans discussed with the patient and POA and she is in agreement. Stable for discharge   Patient should follow up with pcp  CODE STATUS:     Code Status Orders  (From admission, onward)         Start     Ordered   05/02/19 1707  Full code  Continuous     05/02/19 1708        Code Status History    Date Active Date Inactive Code Status Order ID Comments User Context   04/04/2019 0102 04/07/2019 1955 Full Code [id number]  Lance Coon, MD Inpatient   03/28/2019 0204 04/01/2019 2020 Full Code [id number]  Mayer Camel, NP Inpatient   12/18/2018 2039 12/20/2018 1744 Full Code [id number]  Sela Hua, MD Inpatient   12/19/2015 2325 12/20/2015 1749 Full Code [id number]  Lance Coon, MD Inpatient   Advance Care Planning Activity      TOTAL  TIME TAKING CARE OF THIS PATIENT: 38 minutes.    Note: This dictation was prepared with Dragon dictation along with smaller phrase technology. Any transcriptional errors that result from this process are unintentional.  Bettey Costa M.D on 05/05/2019 at 9:54 AM  Between 7am to 6pm - Pager - [phone] After 6pm go to [email or web address] - password [email or web address] [facility]  Office  [phone]  CC: Primary care physician; Dion Body, MD

## 2019-05-05 NOTE — TOC Transition Note (Signed)
Transition of Care [facility]) - CM/SW Discharge Note   Patient Details  Name: Holly Conner MRN: [id number] Date of Birth: 12-Aug-1941  [facility]) CM/SW Contact:  Ross Ludwig, LCSW Phone Number: 05/05/2019, 11:58 AM   Clinical Narrative:     Patient is an 78 year old female who is from [facility].  Patient is alert and oriented x2, patient has some confusion, assessment completed by reviewing patient's chart.  Patient has had an increase in confusion, patient has to have a neurology appointment scheduled.  Patient has been at [facility] for about a month, and plan is to transition to long term care.  CSW spoke to patient's [facility], and she did not express any concerns with patient returning back to SNF.  CSW to facilitate discharge planning, Patient to be d/c'ed today to [facility] room 28B. Patient and family agreeable to plans will transport via ems RN to call report.   Final next level of care: Skilled Nursing Facility Barriers to Discharge: Barriers Resolved   Patient Goals and CMS Choice Patient states their goals for this hospitalization and ongoing recovery are:: To return back to [place] Medicare.gov Compare Post Acute Care list provided to:: Patient Choice offered to / list presented to : [place] / Guardian  Discharge Placement   Existing PASRR number confirmed : 05/05/19            Patient to be transferred to facility by: [facility] Name of family member notified: [name] [phone] Patient and family notified of of transfer: 05/05/19  Discharge Plan and Services                DME Arranged: N/A                    Social Determinants of Health (SDOH) Interventions     Readmission Risk Interventions No flowsheet data found.

## 2019-05-05 NOTE — Care Management Important Message (Signed)
Important Message  Patient Details  Name: Holly Conner MRN: [id number] Date of Birth: Jul 19, 1941   Medicare Important Message Given:  Yes     [name] 05/05/2019, 11:37 AM

## 2019-05-05 NOTE — Consult Note (Signed)
ANTICOAGULATION CONSULT NOTE -   Pharmacy Consult for Heparin Infusion Dosing and monitoring  Indication: chest pain/ACS  Allergies  Allergen Reactions  . Iodinated Diagnostic Agents Shortness Of Breath    Only with nuclear stress test per pt Only with nuclear stress test per pt   . Penicillins Rash    Rash at injection site. Pt states she can take Amoxicillin & Keflex   . Escitalopram   . Lithium   . Erythromycin Nausea Only and Nausea And Vomiting    Other reaction(s): Other (See Comments) dehydration     Patient Measurements: Height: 5\' 1"  (154.9 cm) Weight: 181 lb 11.2 oz (82.4 kg) IBW/kg (Calculated) : 47.8 Heparin Dosing Weight: 68 Kg  Vital Signs: Temp: 97.5 F (36.4 C) (09/06 2006) Temp Source: Oral (09/06 2006) BP: 133/44 (09/06 2006) Pulse Rate: 74 (09/06 2006)  Labs: Recent Labs    05/02/19 1232 05/02/19 1513  05/03/19 0449  05/04/19 0659 05/04/19 1456 05/05/19 0200  HGB 7.1*  --    < > 8.1*  --  8.2*  --  8.0*  HCT 22.4*  --    < > 25.3*  --  25.1*  --  24.7*  PLT 487*  --    < > 458*  --  428*  --  427*  APTT  --  25  --   --   --   --   --   --   LABPROT  --  13.3  --   --   --   --   --   --   INR  --  1.0  --   --   --   --   --   --   HEPARINUNFRC  --   --   --  0.10*   < > 0.78* 0.83* 0.59  CREATININE 2.97*  --   --  2.76*  --  2.60*  --  2.92*  TROPONINIHS 923* 917*  --   --   --   --   --   --    < > = values in this interval not displayed.    Estimated Creatinine Clearance: 14.9 mL/min (A) (by C-G formula based on SCr of 2.92 mg/dL (H)).   Medical History: Past Medical History:  Diagnosis Date  . Arthritis   . Depression    controlled  . Diabetes mellitus without complication ([place])    managed well;   Marland Kitchen GERD (gastroesophageal reflux disease)   . Hepatitis   . HLD (hyperlipidemia)   . Hypertension    somewhat controlled  . 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 Polymyalgia rheumatica ([place])   . Sleep apnea    . Stroke [facility])     Assessment: Pharmacy consulted for heparin infusion dosing and monitoring in 78 yo female admitted with respiratory distress and elevated troponin.  CrCl ~15 mL/min   Hgb: 7.1    HCT: 22.4 . No anticoagulants prior to admission.   Troponin: 923   9/5 @ 0449 HL = 0.10, subtherapeutic, confirmed w/ RN no problems w/ infusion 9/5 @ 1355 HL 0.57  9/5 @ 2221 HL 0.46, therapeutic x 2 9/6 @ 0659 HL 0.78 decrease rate to 950 units/hr 9/6 @1500  HL 0.83, decrease rate to 800 units/hr 9/7 @ 0200 HL 0.59, therapeutic x 1  Goal of Therapy:  Heparin level 0.3-0.7 units/ml Monitor platelets by anticoagulation protocol: Yes   Plan:  Heparin level therapeutic.  Continue heparin infusion at 800 units/hr Check anti-Xa level in 8 hours to confirm and CBC daily while on heparin Continue to monitor H&H and platelets  Hart Robinsons, PharmD 05/05/2019 2:40 AM

## 2019-05-05 NOTE — Progress Notes (Signed)
Patient discharged back to [facility].  Report called and given. Tele and IV removed prior to discharge.

## 2019-05-05 NOTE — NC FL2 (Signed)
[place] LEVEL OF CARE SCREENING TOOL     IDENTIFICATION  Patient Name: Holly Conner Birthdate: 06-Mar-1941 Sex: female Admission Date (Current Location): 05/02/2019  [place] and [place] Number:  Engineering geologist and Address:  [facility], [address] [place], [place] [address]      Provider Number: [id number]  Attending Physician Name and Address:  Bettey Costa, MD  Relative Name and Phone Number:  Arelia Sneddon [phone]  or George, Sweney   [phone]    Current Level of Care: Hospital Recommended Level of Care: [place] Prior Approval Number:    Date Approved/Denied:   PASRR Number: [id number]  Discharge Plan: SNF    Current Diagnoses: Patient Active Problem List   Diagnosis Date Noted  . Acute respiratory failure with hypoxia ([place])   . NSTEMI (non-ST elevated myocardial infarction) ([place]) 05/02/2019  . Memory changes 04/05/2019  . Unresponsive 04/04/2019  . Unresponsive episode 04/03/2019  . Hypoglycemia 04/03/2019  . Elevated troponin 04/03/2019  . Weakness generalized 03/28/2019  . Hypertensive urgency 12/18/2018  . Stroke ([place]) 12/19/2015  . Type 2 diabetes mellitus ([place]) 12/19/2015  . HTN (hypertension) 12/19/2015  . GERD (gastroesophageal reflux disease) 12/19/2015  . HLD (hyperlipidemia) 12/19/2015  . CKD (chronic kidney disease), stage IV ([place]) 12/19/2015    Orientation RESPIRATION BLADDER Height & Weight     Self, Place  Normal Continent Weight: 175 lb 1.6 oz (79.4 kg) Height:  5\' 1"  (154.9 cm)  BEHAVIORAL SYMPTOMS/MOOD NEUROLOGICAL BOWEL NUTRITION STATUS      Continent Diet(Carb Modified)  AMBULATORY STATUS COMMUNICATION OF NEEDS Skin   Limited Assist Verbally Surgical wounds                       Personal Care Assistance Level of Assistance  Dressing, Feeding, Bathing Bathing Assistance: Limited assistance Feeding assistance: Independent Dressing  Assistance: Limited assistance     Functional Limitations Info  Hearing, Sight, Speech Sight Info: Adequate Hearing Info: Adequate Speech Info: Adequate    SPECIAL CARE FACTORS FREQUENCY  OT (By licensed OT), PT (By licensed PT)     PT Frequency: Minimum 5x a week OT Frequency: Minimum 5x a week            Contractures Contractures Info: Not present    Additional Factors Info  Code Status, Allergies, Insulin Sliding Scale Code Status Info: Full Code Allergies Info: Iodinated Diagnostic Agents Penicillins Escitalopram Lithium Erythromycin   Insulin Sliding Scale Info: insulin aspart (novoLOG) injection 0-9 Units 3x a day with meals       Current Medications (05/05/2019):  This is the current hospital active medication list Current Facility-Administered Medications  Medication Dose Route Frequency Provider Last Rate Last Dose  . acetaminophen (TYLENOL) tablet 650 mg  650 mg Oral Q4H PRN Stark Jock, Jude, MD   650 mg at 05/03/19 2001  . aspirin EC tablet 81 mg  81 mg Oral Daily Ojie, Jude, MD   81 mg at 05/05/19 0800  . atorvastatin (LIPITOR) tablet 80 mg  80 mg Oral QPM Ojie, Jude, MD   80 mg at 05/04/19 1659  . cefdinir (OMNICEF) capsule 300 mg  300 mg Oral Daily Bettey Costa, MD   300 mg at 05/05/19 0759  . insulin aspart (novoLOG) injection 0-5 Units  0-5 Units Subcutaneous QHS Stark Jock, Jude, MD   2 Units at 05/04/19 2134  . insulin aspart (novoLOG) injection 0-9 Units  0-9 Units Subcutaneous TID WC Stark Jock, Jude, MD  2 Units at 05/05/19 0758  . loratadine (CLARITIN) tablet 10 mg  10 mg Oral Daily PRN Ojie, Jude, MD      . losartan (COZAAR) tablet 50 mg  50 mg Oral Daily Ojie, Jude, MD   50 mg at 05/05/19 0800  . MEDLINE mouth rinse  15 mL Mouth Rinse BID Tukov-Yual, Magdalene S, NP   15 mL at 05/03/19 2007  . metoprolol tartrate (LOPRESSOR) tablet 25 mg  25 mg Oral BID Bettey Costa, MD   25 mg at 05/05/19 0801  . multivitamin with minerals tablet 1 tablet  1 tablet Oral Daily Ojie,  Jude, MD   1 tablet at 05/05/19 0800  . nitroGLYCERIN (NITROSTAT) SL tablet 0.4 mg  0.4 mg Sublingual Q5 Min x 3 PRN Ojie, Jude, MD      . ondansetron (ZOFRAN) injection 4 mg  4 mg Intravenous Q6H PRN Ojie, Jude, MD      . pantoprazole (PROTONIX) injection 40 mg  40 mg Intravenous Q24H Tukov-Yual, Magdalene S, NP   40 mg at 05/05/19 0759  . pyridOXINE (VITAMIN B-6) tablet 100 mg  100 mg Oral Daily Ojie, Jude, MD   100 mg at 05/04/19 1151  . tamsulosin (FLOMAX) capsule 0.4 mg  0.4 mg Oral QHS Ojie, Jude, MD   0.4 mg at 05/04/19 2134  . vitamin B-12 (CYANOCOBALAMIN) tablet 100 mcg  100 mcg Oral Daily Ojie, Jude, MD   100 mcg at 05/04/19 1151  . vitamin C (ASCORBIC ACID) tablet 1,000 mg  1,000 mg Oral Daily Ojie, Jude, MD   1,000 mg at 05/05/19 0800     Discharge Medications: Please see discharge summary for a list of discharge medications.  Relevant Imaging Results:  Relevant Lab Results:   Additional Information SSN [id number]  Ross Ludwig, LCSW

## 2019-05-06 LAB — GLUCOSE, CAPILLARY: Glucose-Capillary: 209 mg/dL — ABNORMAL HIGH (ref 70–99)

## 2019-05-09 NOTE — Progress Notes (Deleted)
   Patient ID: Genovia Moldenhauer, female    DOB: Mar 09, 1941, 78 y.o.   MRN: [id number]  HPI  Ms Pilgreen is a 78 y/o female with a history of   Echo report from 05/03/2019 reviewed and showed an EF of 50-55% along with moderate MR.   Admitted 05/02/2019 due to pneumonia and HF. Cardiology consult obtained. Weaned off of bipap. COVID test negative. Antibiotics given. Elevated troponin thought to be due to demand ischemia. Ruled out for ACS. 1 unit of PRBC's given for anemia. Discharged after 3 days. Admitted 04/03/2019 due to AMS due to hypoglycemia/ dementia. Given IV fluids. Cardiology and psychiatry consults obtained. Discharged after 4 days.   She presents today for her initial visit with a chief complaint of   Review of Systems    Physical Exam    Assessment & Plan:  1: Chronic heart failure with preserved ejection fraction- - NYHA class - saw cardiology ([place]) 01/15/2019 - BNP 05/02/2019 was 1860.0  2: HTN- - BP - saw PCP ([place]) 03/17/2019 - BMP 05/05/2019 reviewed and showed sodium 136, potassium 4.4, creatinine 2.92 and GFR 15  3: DM- - A1c 05/03/2019 was 6.0%  4: Dementia-

## 2019-05-11 ENCOUNTER — Other Ambulatory Visit: Payer: Self-pay

## 2019-05-11 ENCOUNTER — Inpatient Hospital Stay
Admission: EM | Admit: 2019-05-11 | Discharge: 2019-05-16 | DRG: 871 | Disposition: A | Discharge status: Skilled Nursing Facility | Payer: Medicare Other | Attending: Internal Medicine | Admitting: Internal Medicine

## 2019-05-11 ENCOUNTER — Emergency Department: Payer: Medicare Other

## 2019-05-11 DIAGNOSIS — R451 Restlessness and agitation: Secondary | ICD-10-CM | POA: Diagnosis not present

## 2019-05-11 DIAGNOSIS — E785 Hyperlipidemia, unspecified: Secondary | ICD-10-CM | POA: Diagnosis present

## 2019-05-11 DIAGNOSIS — Z8051 Family history of malignant neoplasm of kidney: Secondary | ICD-10-CM

## 2019-05-11 DIAGNOSIS — N179 Acute kidney failure, unspecified: Secondary | ICD-10-CM | POA: Diagnosis not present

## 2019-05-11 DIAGNOSIS — I2510 Atherosclerotic heart disease of native coronary artery without angina pectoris: Secondary | ICD-10-CM | POA: Diagnosis present

## 2019-05-11 DIAGNOSIS — N184 Chronic kidney disease, stage 4 (severe): Secondary | ICD-10-CM | POA: Diagnosis present

## 2019-05-11 DIAGNOSIS — E1122 Type 2 diabetes mellitus with diabetic chronic kidney disease: Secondary | ICD-10-CM | POA: Diagnosis present

## 2019-05-11 DIAGNOSIS — Y95 Nosocomial condition: Secondary | ICD-10-CM | POA: Diagnosis present

## 2019-05-11 DIAGNOSIS — J189 Pneumonia, unspecified organism: Secondary | ICD-10-CM

## 2019-05-11 DIAGNOSIS — I252 Old myocardial infarction: Secondary | ICD-10-CM

## 2019-05-11 DIAGNOSIS — M353 Polymyalgia rheumatica: Secondary | ICD-10-CM | POA: Diagnosis present

## 2019-05-11 DIAGNOSIS — Z794 Long term (current) use of insulin: Secondary | ICD-10-CM

## 2019-05-11 DIAGNOSIS — Z515 Encounter for palliative care: Secondary | ICD-10-CM

## 2019-05-11 DIAGNOSIS — I130 Hypertensive heart and chronic kidney disease with heart failure and stage 1 through stage 4 chronic kidney disease, or unspecified chronic kidney disease: Secondary | ICD-10-CM | POA: Diagnosis present

## 2019-05-11 DIAGNOSIS — K219 Gastro-esophageal reflux disease without esophagitis: Secondary | ICD-10-CM | POA: Diagnosis present

## 2019-05-11 DIAGNOSIS — F419 Anxiety disorder, unspecified: Secondary | ICD-10-CM | POA: Diagnosis present

## 2019-05-11 DIAGNOSIS — A419 Sepsis, unspecified organism: Principal | ICD-10-CM | POA: Diagnosis present

## 2019-05-11 DIAGNOSIS — J9621 Acute and chronic respiratory failure with hypoxia: Secondary | ICD-10-CM | POA: Diagnosis present

## 2019-05-11 DIAGNOSIS — I5023 Acute on chronic systolic (congestive) heart failure: Secondary | ICD-10-CM | POA: Diagnosis not present

## 2019-05-11 DIAGNOSIS — J440 Chronic obstructive pulmonary disease with acute lower respiratory infection: Secondary | ICD-10-CM | POA: Diagnosis present

## 2019-05-11 DIAGNOSIS — G4733 Obstructive sleep apnea (adult) (pediatric): Secondary | ICD-10-CM | POA: Diagnosis present

## 2019-05-11 DIAGNOSIS — J810 Acute pulmonary edema: Secondary | ICD-10-CM | POA: Diagnosis not present

## 2019-05-11 DIAGNOSIS — K759 Inflammatory liver disease, unspecified: Secondary | ICD-10-CM | POA: Diagnosis present

## 2019-05-11 DIAGNOSIS — Z7982 Long term (current) use of aspirin: Secondary | ICD-10-CM

## 2019-05-11 DIAGNOSIS — F05 Delirium due to known physiological condition: Secondary | ICD-10-CM | POA: Diagnosis not present

## 2019-05-11 DIAGNOSIS — D638 Anemia in other chronic diseases classified elsewhere: Secondary | ICD-10-CM | POA: Diagnosis present

## 2019-05-11 DIAGNOSIS — Z8673 Personal history of transient ischemic attack (TIA), and cerebral infarction without residual deficits: Secondary | ICD-10-CM

## 2019-05-11 DIAGNOSIS — F329 Major depressive disorder, single episode, unspecified: Secondary | ICD-10-CM | POA: Diagnosis present

## 2019-05-11 DIAGNOSIS — J9601 Acute respiratory failure with hypoxia: Secondary | ICD-10-CM | POA: Diagnosis not present

## 2019-05-11 DIAGNOSIS — Z7189 Other specified counseling: Secondary | ICD-10-CM

## 2019-05-11 DIAGNOSIS — G4700 Insomnia, unspecified: Secondary | ICD-10-CM | POA: Diagnosis not present

## 2019-05-11 DIAGNOSIS — Z20828 Contact with and (suspected) exposure to other viral communicable diseases: Secondary | ICD-10-CM | POA: Diagnosis present

## 2019-05-11 DIAGNOSIS — I5033 Acute on chronic diastolic (congestive) heart failure: Secondary | ICD-10-CM | POA: Diagnosis present

## 2019-05-11 DIAGNOSIS — Z90710 Acquired absence of both cervix and uterus: Secondary | ICD-10-CM

## 2019-05-11 DIAGNOSIS — Z66 Do not resuscitate: Secondary | ICD-10-CM | POA: Diagnosis present

## 2019-05-11 DIAGNOSIS — Z881 Allergy status to other antibiotic agents status: Secondary | ICD-10-CM

## 2019-05-11 DIAGNOSIS — Z888 Allergy status to other drugs, medicaments and biological substances status: Secondary | ICD-10-CM

## 2019-05-11 DIAGNOSIS — F339 Major depressive disorder, recurrent, unspecified: Secondary | ICD-10-CM | POA: Diagnosis not present

## 2019-05-11 DIAGNOSIS — Z79899 Other long term (current) drug therapy: Secondary | ICD-10-CM

## 2019-05-11 DIAGNOSIS — M1990 Unspecified osteoarthritis, unspecified site: Secondary | ICD-10-CM | POA: Diagnosis present

## 2019-05-11 DIAGNOSIS — Z880 Allergy status to penicillin: Secondary | ICD-10-CM

## 2019-05-11 DIAGNOSIS — F01518 Vascular dementia, unspecified severity, with other behavioral disturbance: Secondary | ICD-10-CM

## 2019-05-11 DIAGNOSIS — F0151 Vascular dementia with behavioral disturbance: Secondary | ICD-10-CM | POA: Diagnosis not present

## 2019-05-11 DIAGNOSIS — Z23 Encounter for immunization: Secondary | ICD-10-CM | POA: Diagnosis present

## 2019-05-11 DIAGNOSIS — R410 Disorientation, unspecified: Secondary | ICD-10-CM | POA: Diagnosis not present

## 2019-05-11 DIAGNOSIS — J9600 Acute respiratory failure, unspecified whether with hypoxia or hypercapnia: Secondary | ICD-10-CM | POA: Diagnosis not present

## 2019-05-11 DIAGNOSIS — M4800 Spinal stenosis, site unspecified: Secondary | ICD-10-CM | POA: Diagnosis present

## 2019-05-11 DIAGNOSIS — Z96651 Presence of right artificial knee joint: Secondary | ICD-10-CM | POA: Diagnosis present

## 2019-05-11 LAB — BLOOD GAS, ARTERIAL
Acid-base deficit: 6.2 mmol/L — ABNORMAL HIGH (ref 0.0–2.0)
Bicarbonate: 17.7 mmol/L — ABNORMAL LOW (ref 20.0–28.0)
Delivery systems: POSITIVE
Expiratory PAP: 8
FIO2: 0.4
Inspiratory PAP: 16
O2 Saturation: 97.4 %
Patient temperature: 37
pCO2 arterial: 30 mmHg — ABNORMAL LOW (ref 32.0–48.0)
pH, Arterial: 7.38 (ref 7.350–7.450)
pO2, Arterial: 97 mmHg (ref 83.0–108.0)

## 2019-05-11 LAB — CBC WITH DIFFERENTIAL/PLATELET
Abs Immature Granulocytes: 0.1 10*3/uL — ABNORMAL HIGH (ref 0.00–0.07)
Basophils Absolute: 0 10*3/uL (ref 0.0–0.1)
Basophils Relative: 0 %
Eosinophils Absolute: 0 10*3/uL (ref 0.0–0.5)
Eosinophils Relative: 0 %
HCT: 27 % — ABNORMAL LOW (ref 36.0–46.0)
Hemoglobin: 8.8 g/dL — ABNORMAL LOW (ref 12.0–15.0)
Immature Granulocytes: 1 %
Lymphocytes Relative: 5 %
Lymphs Abs: 0.8 10*3/uL (ref 0.7–4.0)
MCH: 30.3 pg (ref 26.0–34.0)
MCHC: 32.6 g/dL (ref 30.0–36.0)
MCV: 93.1 fL (ref 80.0–100.0)
Monocytes Absolute: 0.6 10*3/uL (ref 0.1–1.0)
Monocytes Relative: 3 %
Neutro Abs: 15.7 10*3/uL — ABNORMAL HIGH (ref 1.7–7.7)
Neutrophils Relative %: 91 %
Platelets: 351 10*3/uL (ref 150–400)
RBC: 2.9 MIL/uL — ABNORMAL LOW (ref 3.87–5.11)
RDW: 15 % (ref 11.5–15.5)
WBC: 17.2 10*3/uL — ABNORMAL HIGH (ref 4.0–10.5)
nRBC: 0 % (ref 0.0–0.2)

## 2019-05-11 LAB — COMPREHENSIVE METABOLIC PANEL WITH GFR
ALT: 20 U/L (ref 0–44)
AST: 27 U/L (ref 15–41)
Albumin: 2.8 g/dL — ABNORMAL LOW (ref 3.5–5.0)
Alkaline Phosphatase: 76 U/L (ref 38–126)
Anion gap: 12 (ref 5–15)
BUN: 44 mg/dL — ABNORMAL HIGH (ref 8–23)
CO2: 19 mmol/L — ABNORMAL LOW (ref 22–32)
Calcium: 8.7 mg/dL — ABNORMAL LOW (ref 8.9–10.3)
Chloride: 104 mmol/L (ref 98–111)
Creatinine, Ser: 2.89 mg/dL — ABNORMAL HIGH (ref 0.44–1.00)
GFR calc Af Amer: 17 mL/min — ABNORMAL LOW
GFR calc non Af Amer: 15 mL/min — ABNORMAL LOW
Glucose, Bld: 296 mg/dL — ABNORMAL HIGH (ref 70–99)
Potassium: 4.9 mmol/L (ref 3.5–5.1)
Sodium: 135 mmol/L (ref 135–145)
Total Bilirubin: 1.1 mg/dL (ref 0.3–1.2)
Total Protein: 6.1 g/dL — ABNORMAL LOW (ref 6.5–8.1)

## 2019-05-11 LAB — LACTIC ACID, PLASMA
Lactic Acid, Venous: 2.2 mmol/L (ref 0.5–1.9)
Lactic Acid, Venous: 1.1 mmol/L (ref 0.5–1.9)

## 2019-05-11 LAB — TROPONIN I (HIGH SENSITIVITY)
Troponin I (High Sensitivity): 110 ng/L (ref <18)
Troponin I (High Sensitivity): 144 ng/L (ref <18)

## 2019-05-11 LAB — SARS CORONAVIRUS 2 BY RT PCR (HOSPITAL ORDER, PERFORMED IN ~~LOC~~ HOSPITAL LAB): SARS Coronavirus 2: NEGATIVE

## 2019-05-11 LAB — BRAIN NATRIURETIC PEPTIDE: B Natriuretic Peptide: 3933 pg/mL — ABNORMAL HIGH (ref 0.0–100.0)

## 2019-05-11 LAB — PROCALCITONIN: Procalcitonin: 5.32 ng/mL

## 2019-05-11 MED ORDER — SODIUM CHLORIDE 0.9 % IV SOLN
2.0000 g | INTRAVENOUS | Status: DC
  Administered 2019-05-12 – 2019-05-14 (×3): 2 g via INTRAVENOUS
  Filled 2019-05-11 (×5): qty 2

## 2019-05-11 MED ORDER — VANCOMYCIN HCL 1.5 G IV SOLR
1500.0000 mg | Freq: Once | INTRAVENOUS | Status: AC
  Administered 2019-05-11: 20:00:00 1500 mg via INTRAVENOUS
  Filled 2019-05-11: qty 1500

## 2019-05-11 MED ORDER — FUROSEMIDE 10 MG/ML IJ SOLN
20.0000 mg | Freq: Two times a day (BID) | INTRAMUSCULAR | Status: DC
  Administered 2019-05-12 – 2019-05-15 (×8): 20 mg via INTRAVENOUS
  Filled 2019-05-11 (×3): qty 4
  Filled 2019-05-11: qty 2
  Filled 2019-05-11 (×4): qty 4
  Filled 2019-05-11: qty 2

## 2019-05-11 MED ORDER — ASPIRIN EC 81 MG PO TBEC
81.0000 mg | DELAYED_RELEASE_TABLET | Freq: Every day | ORAL | Status: DC
  Administered 2019-05-12 – 2019-05-16 (×5): 81 mg via ORAL
  Filled 2019-05-11 (×5): qty 1

## 2019-05-11 MED ORDER — ATORVASTATIN CALCIUM 20 MG PO TABS
80.0000 mg | ORAL_TABLET | Freq: Every day | ORAL | Status: DC
  Administered 2019-05-12 – 2019-05-16 (×5): 80 mg via ORAL
  Filled 2019-05-11 (×5): qty 4

## 2019-05-11 MED ORDER — LOSARTAN POTASSIUM 50 MG PO TABS: 50.0000 mg | ORAL_TABLET | Freq: Every day | ORAL | Status: DC

## 2019-05-11 MED ORDER — LORATADINE 10 MG PO TABS: 10.0000 mg | ORAL_TABLET | Freq: Every day | ORAL | Status: DC | PRN

## 2019-05-11 MED ORDER — VANCOMYCIN HCL 1.5 G IV SOLR
1500.0000 mg | Freq: Once | INTRAVENOUS | Status: DC
  Filled 2019-05-11: qty 1500

## 2019-05-11 MED ORDER — LEVOFLOXACIN IN D5W 750 MG/150ML IV SOLN: 750.0000 mg | INTRAVENOUS | Status: DC

## 2019-05-11 MED ORDER — VITAMIN B-6 50 MG PO TABS
100.0000 mg | ORAL_TABLET | Freq: Every day | ORAL | Status: DC
  Administered 2019-05-12 – 2019-05-16 (×5): 100 mg via ORAL
  Filled 2019-05-11 (×5): qty 2

## 2019-05-11 MED ORDER — FUROSEMIDE 10 MG/ML IJ SOLN: 20.0000 mg | Freq: Every day | INTRAMUSCULAR | Status: DC

## 2019-05-11 MED ORDER — TAMSULOSIN HCL 0.4 MG PO CAPS
0.4000 mg | ORAL_CAPSULE | Freq: Every day | ORAL | Status: DC
  Administered 2019-05-12 – 2019-05-15 (×4): 0.4 mg via ORAL
  Filled 2019-05-11 (×4): qty 1

## 2019-05-11 MED ORDER — DARIFENACIN HYDROBROMIDE ER 7.5 MG PO TB24
7.5000 mg | ORAL_TABLET | Freq: Every day | ORAL | Status: DC
  Administered 2019-05-12 – 2019-05-15 (×4): 7.5 mg via ORAL
  Filled 2019-05-11 (×7): qty 1

## 2019-05-11 MED ORDER — VANCOMYCIN VARIABLE DOSE PER UNSTABLE RENAL FUNCTION (PHARMACIST DOSING): Status: DC

## 2019-05-11 MED ORDER — METOPROLOL TARTRATE 25 MG PO TABS
25.0000 mg | ORAL_TABLET | Freq: Two times a day (BID) | ORAL | Status: DC
  Administered 2019-05-12 – 2019-05-15 (×8): 25 mg via ORAL
  Filled 2019-05-11 (×9): qty 1

## 2019-05-11 MED ORDER — DEXAMETHASONE SODIUM PHOSPHATE 10 MG/ML IJ SOLN
10.0000 mg | Freq: Once | INTRAMUSCULAR | Status: AC
  Administered 2019-05-11: 20:00:00 10 mg via INTRAVENOUS
  Filled 2019-05-11: qty 1

## 2019-05-11 MED ORDER — ADULT MULTIVITAMIN W/MINERALS CH
1.0000 | ORAL_TABLET | Freq: Every day | ORAL | Status: DC
  Administered 2019-05-12 – 2019-05-16 (×5): 1 via ORAL
  Filled 2019-05-11 (×5): qty 1

## 2019-05-11 MED ORDER — FUROSEMIDE 10 MG/ML IJ SOLN
40.0000 mg | Freq: Once | INTRAMUSCULAR | Status: AC
  Administered 2019-05-11: 20:00:00 40 mg via INTRAVENOUS
  Filled 2019-05-11: qty 4

## 2019-05-11 MED ORDER — CHLORHEXIDINE GLUCONATE CLOTH 2 % EX PADS
6.0000 | MEDICATED_PAD | Freq: Every day | CUTANEOUS | Status: DC
  Administered 2019-05-12 – 2019-05-15 (×4): 6 via TOPICAL

## 2019-05-11 MED ORDER — VITAMIN B-12 100 MCG PO TABS
100.0000 ug | ORAL_TABLET | Freq: Every day | ORAL | Status: DC
  Administered 2019-05-12 – 2019-05-16 (×5): 100 ug via ORAL
  Filled 2019-05-11 (×5): qty 1

## 2019-05-11 MED ORDER — INSULIN GLARGINE 100 UNIT/ML ~~LOC~~ SOLN
10.0000 [IU] | Freq: Every day | SUBCUTANEOUS | Status: DC
  Filled 2019-05-11: qty 0.1

## 2019-05-11 MED ORDER — VANCOMYCIN HCL 500 MG IV SOLR: 500.0000 mg | INTRAVENOUS | Status: DC

## 2019-05-11 MED ORDER — SODIUM CHLORIDE 0.9 % IV SOLN
2.0000 g | Freq: Once | INTRAVENOUS | Status: AC
  Administered 2019-05-11: 20:00:00 2 g via INTRAVENOUS
  Filled 2019-05-11: qty 2

## 2019-05-11 MED ORDER — VITAMIN C 500 MG PO TABS
1000.0000 mg | ORAL_TABLET | Freq: Every day | ORAL | Status: DC
  Administered 2019-05-12 – 2019-05-16 (×5): 1000 mg via ORAL
  Filled 2019-05-11 (×4): qty 2

## 2019-05-11 NOTE — Progress Notes (Signed)
Placed patient on bipap due to respiratory distress. Under covid precautions. All precautions taken. Filters in place. Unit checked and verified. Patient tolerated interventions well

## 2019-05-11 NOTE — H&P (Addendum)
[facility] [place] NAME: Holly Conner    MR#:  [id number]  DATE OF BIRTH:  04-Mar-1941  DATE OF ADMISSION:  05/11/2019  PRIMARY CARE PHYSICIAN: Dion Body, MD   REQUESTING/REFERRING PHYSICIAN: Duffy Bruce, MD  CHIEF COMPLAINT:   Chief Complaint  Patient presents with   Shortness of Breath    HISTORY OF PRESENT ILLNESS:  Holly Conner  is a 78 y.o. female with a known history of CAD,Hypertension, diabetes mellitus chronic kidney disease stage IV and depression is being admitted for her on chronic hypoxic respiratory failure requiring BiPAP.  Patient reports having cough, fever.  She started having acute onset of shortness of breath, fever, and weakness over the last 24 hours. She has reported intermittent confusion due to her recent strokes. She feels tired and fatigued and has been having chills.   PAST MEDICAL HISTORY:   Past Medical History:  Diagnosis Date   Arthritis    Depression    controlled   Diabetes mellitus without complication ([place])    managed well;    GERD (gastroesophageal reflux disease)    Hepatitis    HLD (hyperlipidemia)    Hypertension    somewhat controlled   Kidney failure    4th stage; but bloodwork is stable;    Polymyalgia rheumatica (HCC)    Sleep apnea    Stroke [facility])     PAST SURGICAL HISTORY:   Past Surgical History:  Procedure Laterality Date   ABDOMINAL HYSTERECTOMY     BILATERAL SALPINGOOPHORECTOMY     ESOPHAGEAL DILATION     OOPHORECTOMY     REDUCTION MAMMAPLASTY Bilateral 2003   REPLACEMENT TOTAL KNEE Right    TEE WITHOUT CARDIOVERSION N/A 03/31/2019   Procedure: TRANSESOPHAGEAL ECHOCARDIOGRAM (TEE);  Surgeon: Corey Skains, MD;  Location: [facility] ORS;  Service: Cardiovascular;  Laterality: N/A;   TONSILLECTOMY AND ADENOIDECTOMY      SOCIAL HISTORY:   Social History   Tobacco Use   Smoking status: Never Smoker   Smokeless tobacco:  Never Used  Substance Use Topics   Alcohol use: No    Alcohol/week: 0.0 standard drinks    FAMILY HISTORY:   Family History  Problem Relation Age of Onset   CAD Brother    Kidney cancer Mother    Breast cancer Cousin     DRUG ALLERGIES:   Allergies  Allergen Reactions   Iodinated Diagnostic Agents Shortness Of Breath    Only with nuclear stress test per pt Only with nuclear stress test per pt    Penicillins Rash    Rash at injection site. Pt states she can take Amoxicillin & Keflex    Escitalopram    Lithium    Erythromycin Nausea Only and Nausea And Vomiting    Other reaction(s): Other (See Comments) dehydration     REVIEW OF SYSTEMS:   Review of Systems  Constitutional: Positive for chills and fever. Negative for diaphoresis, malaise/fatigue and weight loss.  HENT: Negative for ear discharge, ear pain, hearing loss, nosebleeds, sore throat and tinnitus.   Eyes: Negative for blurred vision and pain.  Respiratory: Positive for cough and shortness of breath. Negative for hemoptysis and wheezing.   Cardiovascular: Negative for chest pain, palpitations, orthopnea and leg swelling.  Gastrointestinal: Negative for abdominal pain, blood in stool, constipation, diarrhea, heartburn, nausea and vomiting.  Genitourinary: Negative for dysuria, frequency and urgency.  Musculoskeletal: Negative for back pain and myalgias.  Skin: Negative for itching and rash.  Neurological: Negative for dizziness, tingling, tremors, focal weakness, seizures, weakness and headaches.  Psychiatric/Behavioral: Negative for depression. The patient is not nervous/anxious.    MEDICATIONS AT HOME:   Prior to Admission medications   Medication Sig Start Date End Date Taking? Authorizing Provider  Ascorbic Acid (VITAMIN C) 1000 MG tablet Take 1,000 mg by mouth daily.   Yes [provider]  aspirin EC 81 MG tablet Take 1 tablet (81 mg total) by mouth daily. 04/01/19 03/31/20 Yes Vaughan Basta, MD  atorvastatin (LIPITOR) 80 MG tablet Take 80 mg by mouth daily.    Yes [provider]  insulin aspart (NOVOLOG FLEXPEN) 100 UNIT/ML FlexPen Inject 0-12 Units into the skin See admin instructions. Inject under the skin three times daily with meals according to sliding scale:   200 or below: 0u 201-250: 2u 251-300: 4u 301-350: 6u 351-400: 8u 401-450: 10u 450 or higher: 12u and contact physician   Yes [provider]  Insulin Degludec (TRESIBA FLEXTOUCH) 200 UNIT/ML SOPN Inject 10 Units into the skin daily. 05/05/19  Yes Mody, Ulice Bold, MD  losartan (COZAAR) 50 MG tablet Take 50 mg by mouth daily.   Yes [provider]  metoprolol tartrate (LOPRESSOR) 25 MG tablet Take 1 tablet (25 mg total) by mouth 2 (two) times daily. 05/05/19  Yes Bettey Costa, MD  Multiple Vitamin (MULTIVITAMIN WITH MINERALS) TABS tablet Take 1 tablet by mouth daily. 04/01/19  Yes Vaughan Basta, MD  pyridOXINE (VITAMIN B-6) 100 MG tablet Take 100 mg by mouth daily.   Yes [provider]  solifenacin (VESICARE) 10 MG tablet Take 1 tablet (10 mg total) by mouth daily. 10/11/18  Yes Stoioff, Ronda Fairly, MD  tamsulosin (FLOMAX) 0.4 MG CAPS capsule Take 1 capsule (0.4 mg total) by mouth at bedtime. 04/01/19  Yes Vaughan Basta, MD  vitamin B-12 (CYANOCOBALAMIN) 100 MCG tablet Take 100 mcg by mouth daily.    Yes [provider]  loratadine (CLARITIN) 10 MG tablet Take 10 mg by mouth daily as needed for allergies.    [provider]      VITAL SIGNS:  Blood pressure (!) 153/97, pulse 85, temperature (!) 101.4 F (38.6 C), temperature source Rectal, resp. rate 20, height 5\' 1"  (1.549 m), weight 79.4 kg, SpO2 98 %.  PHYSICAL EXAMINATION:  Physical Exam  GENERAL:  78 y.o.-year-old patient lying in the bed -on BiPAP EYES: Pupils equal, round, reactive to light and accommodation. No scleral icterus. Extraocular muscles intact.  HEENT: Head atraumatic,  normocephalic. Oropharynx and nasopharynx clear.  NECK:  Supple, no jugular venous distention. No thyroid enlargement, no tenderness.  LUNGS: Decreased breath sounds bilaterally, no wheezing, has rales,rhonchi at bases. she is using accessory muscles of respiration.  CARDIOVASCULAR: S1, S2 normal. No murmurs, rubs, or gallops.  ABDOMEN: Soft, nontender, nondistended. Bowel sounds present. No organomegaly or mass.  EXTREMITIES: No pedal edema, cyanosis, or clubbing.  NEUROLOGIC: Cranial nerves II through XII are intact. Muscle strength 5/5 in all extremities. Sensation intact. Gait not checked.  PSYCHIATRIC: The patient is alert but difficult to evaluate due to BiPAP on SKIN: No obvious rash, lesion, or ulcer.  LABORATORY PANEL:   CBC Recent Labs  Lab 05/11/19 1857  WBC 17.2*  HGB 8.8*  HCT 27.0*  PLT 351   ------------------------------------------------------------------------------------------------------------------  Chemistries  Recent Labs  Lab 05/11/19 1857  NA 135  K 4.9  CL 104  CO2 19*  GLUCOSE 296*  BUN 44*  CREATININE 2.89*  CALCIUM 8.7*  AST  27  ALT 20  ALKPHOS 76  BILITOT 1.1   ------------------------------------------------------------------------------------------------------------------  Cardiac Enzymes No results for input(s): TROPONINI in the last 168 hours. ------------------------------------------------------------------------------------------------------------------  RADIOLOGY:  Dg Chest Portable 1 View  Result Date: 05/11/2019 CLINICAL DATA:  Shortness of breath EXAM: PORTABLE CHEST 1 VIEW COMPARISON:  05/04/2019, 05/02/2019 FINDINGS: Cardiomegaly. Interval worsening of left greater than right interstitial and ground-glass disease. No large effusion. No pneumothorax. IMPRESSION: Cardiomegaly. Interval worsening of diffuse left greater than right interstitial and ground-glass opacity, which may reflect edema, diffuse pneumonia, or combination of  the 2 Electronically Signed   By: Donavan Foil M.D.   On: 05/11/2019 19:21   IMPRESSION AND PLAN:  Patient is an 78 year old female with history of diabetes mellitus, chronic kidney disease stage IV and CAD who presented to the emergency room with shortness of breath, cough fever and chills  1.  Acute on chronic hypoxic respiratory failure -Requiring BiPAP. Being admitted to ICU.  2.   Sepsis: Present on admission -Due to pneumonia  3.   Acute on chronic diastolic CHF -Cardiology consultation -Serial troponins -Gentle diuresis as able  4.   Pneumonia -We will start IV antibiotic  5.  Diabetes mellitus type 2 - sliding scale insulin.  ICU hyperglycemia protocol will be followed  6.  Chronic kidney disease stage IV Monitor renal function.  To consider nephrology consult if worsening of renal function in a.m.  7.  Anemia of chronic disease Hemoglobin 8.8   Notified Dr Wallis Bamberg at [facility]  All the records are reviewed and case discussed with ED provider. Management plans discussed with the patient, nursing and they are in agreement.  CODE STATUS: Full code  TOTAL TIME (critical care) TAKING CARE OF THIS PATIENT: 45 minutes.    Max Sane M.D on 05/11/2019 at 10:43 PM  Between 7am to 6pm - Pager - [phone]  After 6pm go to [email or web address] - Proofreader  [facility]  Office  [phone]  CC: Primary care physician; Dion Body, MD   Note: This dictation was prepared with Dragon dictation along with smaller phrase technology. Any transcriptional errors that result from this process are unintentional.

## 2019-05-11 NOTE — Consult Note (Signed)
Pharmacy Antibiotic Note  Holly Conner is a 78 y.o. female admitted on 05/11/2019 with pneumonia.  Pharmacy has been consulted for Cefepime and Vancomycin dosing. Patient has PMH of CKD IV.   Plan: Vancomycin 1500mg  IV x 1 dose in ED. Start Vancomycin 500 mg IV Q 48 hrs. Goal AUC 400-550. Expected AUC: 445.9 SCr used: 2.89 Css: 13.1  Start Cefepime 2g IV every 24 hours.   Height: 5\' 1"  (154.9 cm) Weight: 175 lb 1.6 oz (79.4 kg) IBW/kg (Calculated) : 47.8  Temp (24hrs), Avg:100 F (37.8 C), Min:98.5 F (36.9 C), Max:101.4 F (38.6 C)  Recent Labs  Lab 05/05/19 0200 05/11/19 1857 05/11/19 2105  WBC 5.8 17.2*  --   CREATININE 2.92* 2.89*  --   LATICACIDVEN  --  2.2* 1.1    Estimated Creatinine Clearance: 14.8 mL/min (A) (by C-G formula based on SCr of 2.89 mg/dL (H)).    Allergies  Allergen Reactions  . Iodinated Diagnostic Agents Shortness Of Breath    Only with nuclear stress test per pt Only with nuclear stress test per pt   . Penicillins Rash    Rash at injection site. Pt states she can take Amoxicillin & Keflex   . Escitalopram   . Lithium   . Erythromycin Nausea Only and Nausea And Vomiting    Other reaction(s): Other (See Comments) dehydration     Antimicrobials this admission: 9/13 vancomycin >>  9/13 Cefepime >>   Microbiology results: 9/13  BCx: pending 9/14 MRSA PCR: pending 9/13 SARS Coronavirus 2: negative   Thank you for allowing pharmacy to be a part of this patient's care.  Pernell Dupre, PharmD, BCPS Clinical Pharmacist 05/11/2019 10:13 PM

## 2019-05-11 NOTE — ED Notes (Signed)
Pt at end of bed with all monitoring equipment off. Pt is very confused at this time and does follow commands.

## 2019-05-11 NOTE — ED Notes (Signed)
ED TO INPATIENT HANDOFF REPORT  ED Nurse Name and Phone #: Wells Guiles [phone]  S Name/Age/Gender Holly Conner 78 y.o. female Room/Bed: ED09A/ED09A  Code Status   Code Status: Prior  Home/SNF/Other Nursing Home Patient oriented to: self Is this baseline? Pt has dementia   Triage Complete: Triage complete  Chief Complaint difficulty breathing  Triage Note Pt to ED via [facility] from [facility] care with shortness of breath. Per Ems, pt found with low sats 63% on room air. Pt seen to been using accessory muscles with increased work of breathing. Oxygen saturation increased to 100% on 4L [place]. Per pt, chronic use of oxygen of unknown amount.    Allergies Allergies  Allergen Reactions  . Iodinated Diagnostic Agents Shortness Of Breath    Only with nuclear stress test per pt Only with nuclear stress test per pt   . Penicillins Rash    Rash at injection site. Pt states she can take Amoxicillin & Keflex   . Escitalopram   . Lithium   . Erythromycin Nausea Only and Nausea And Vomiting    Other reaction(s): Other (See Comments) dehydration     Level of Care/Admitting Diagnosis ED Disposition    ED Disposition Condition [facility] [facility]: [place] [100120]  Level of Care: ICU [6]  Covid Evaluation: Confirmed COVID Negative  Diagnosis: Acute respiratory failure ([place]) [518.81.ICD-9-CM]  Admitting Physician: Max Sane [id number]  Attending Physician: Max Sane [id number]  Estimated length of stay: past midnight tomorrow  Certification:: I certify this patient will need inpatient services for at least 2 midnights  PT Class (Do Not Modify): Inpatient [101]  PT Acc Code (Do Not Modify): Private [1]       B Medical/Surgery History Past Medical History:  Diagnosis Date  . Arthritis   . Depression    controlled  . Diabetes mellitus without complication ([place])    managed well;   Marland Kitchen GERD (gastroesophageal reflux disease)   . Hepatitis    . HLD (hyperlipidemia)   . Hypertension    somewhat controlled  . 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 Polymyalgia rheumatica ([place])   . Sleep apnea   . Stroke [facility])    Past Surgical History:  Procedure Laterality Date  . ABDOMINAL HYSTERECTOMY    . BILATERAL SALPINGOOPHORECTOMY    . ESOPHAGEAL DILATION    . OOPHORECTOMY    . REDUCTION MAMMAPLASTY Bilateral 2003  . REPLACEMENT TOTAL KNEE Right   . TEE WITHOUT CARDIOVERSION N/A 03/31/2019   Procedure: TRANSESOPHAGEAL ECHOCARDIOGRAM (TEE);  Surgeon: Corey Skains, MD;  Location: [facility] ORS;  Service: Cardiovascular;  Laterality: N/A;  . TONSILLECTOMY AND ADENOIDECTOMY       A IV Location/Drains/Wounds Patient Lines/Drains/Airways Status   Active Line/Drains/Airways    Name:   Placement date:   Placement time:   Site:   Days:   Peripheral IV 05/11/19 Left Forearm   05/11/19    1855    Forearm   less than 1   Peripheral IV 05/11/19 Right Forearm   05/11/19    1910    Forearm   less than 1   External Urinary Catheter   05/02/19    1450    -   9   Wound / Incision (Open or Dehisced) 05/03/19 Venous stasis ulcer Abdomen Lower;Medial;Right   05/03/19    0455    Abdomen   8  Intake/Output Last 24 hours No intake or output data in the 24 hours ending 05/11/19 2200  Labs/Imaging Results for orders placed or performed during the hospital encounter of 05/11/19 (from the past 48 hour(s))  CBC with Differential     Status: Abnormal   Collection Time: 05/11/19  6:57 PM  Result Value Ref Range   WBC 17.2 (H) 4.0 - 10.5 K/uL   RBC 2.90 (L) 3.87 - 5.11 MIL/uL   Hemoglobin 8.8 (L) 12.0 - 15.0 g/dL   HCT 27.0 (L) 36.0 - 46.0 %   MCV 93.1 80.0 - 100.0 fL   MCH 30.3 26.0 - 34.0 pg   MCHC 32.6 30.0 - 36.0 g/dL   RDW 15.0 11.5 - 15.5 %   Platelets 351 150 - 400 K/uL   nRBC 0.0 0.0 - 0.2 %   Neutrophils Relative % 91 %   Neutro Abs 15.7 (H) 1.7 - 7.7 K/uL   Lymphocytes Relative 5 %   Lymphs Abs 0.8 0.7 -  4.0 K/uL   Monocytes Relative 3 %   Monocytes Absolute 0.6 0.1 - 1.0 K/uL   Eosinophils Relative 0 %   Eosinophils Absolute 0.0 0.0 - 0.5 K/uL   Basophils Relative 0 %   Basophils Absolute 0.0 0.0 - 0.1 K/uL   Immature Granulocytes 1 %   Abs Immature Granulocytes 0.10 (H) 0.00 - 0.07 K/uL    Comment: Performed at [facility], [address] [place], [place] [address]  Comprehensive metabolic panel     Status: Abnormal   Collection Time: 05/11/19  6:57 PM  Result Value Ref Range   Sodium 135 135 - 145 mmol/L   Potassium 4.9 3.5 - 5.1 mmol/L   Chloride 104 98 - 111 mmol/L   CO2 19 (L) 22 - 32 mmol/L   Glucose, Bld 296 (H) 70 - 99 mg/dL   BUN 44 (H) 8 - 23 mg/dL   Creatinine, Ser 2.89 (H) 0.44 - 1.00 mg/dL   Calcium 8.7 (L) 8.9 - 10.3 mg/dL   Total Protein 6.1 (L) 6.5 - 8.1 g/dL   Albumin 2.8 (L) 3.5 - 5.0 g/dL   AST 27 15 - 41 U/L   ALT 20 0 - 44 U/L   Alkaline Phosphatase 76 38 - 126 U/L   Total Bilirubin 1.1 0.3 - 1.2 mg/dL   GFR calc non Af Amer 15 (L) >60 mL/min   GFR calc Af Amer 17 (L) >60 mL/min   Anion gap 12 5 - 15    Comment: Performed at [facility], [address] [place], [place] [address]  Brain natriuretic peptide     Status: Abnormal   Collection Time: 05/11/19  6:57 PM  Result Value Ref Range   B Natriuretic Peptide 3,933.0 (H) 0.0 - 100.0 pg/mL    Comment: Performed at [facility], [address] [place], [place] [address]  Troponin I (High Sensitivity)     Status: Abnormal   Collection Time: 05/11/19  6:57 PM  Result Value Ref Range   Troponin I (High Sensitivity) 110 (HH) <18 ng/L    Comment: CRITICAL RESULT CALLED TO, READ BACK BY AND VERIFIED WITH Khylee Algeo @1939  ON 05/11/2019 BY FMW (NOTE) Elevated high sensitivity troponin I (hsTnI) values and significant  changes across serial measurements may suggest ACS but many other  chronic and acute conditions are known to elevate hsTnI results.  Refer to the "Links"  section for chest pain algorithms and additional  guidance. Performed at [facility], [place], [place] [address]   Lactic acid, plasma     Status: Abnormal   Collection Time: 05/11/19  6:57 PM  Result Value Ref Range   Lactic Acid, Venous 2.2 (HH) 0.5 - 1.9 mmol/L    Comment: CRITICAL RESULT CALLED TO, READ BACK BY AND VERIFIED WITH Naba Sneed @1932  ON 05/11/2019 BY FMW Performed at [facility], [address] [place], [place] [address]   SARS Coronavirus 2 [facility] order, Performed in [facility] hospital lab) Nasopharyngeal Nasopharyngeal Swab     Status: None   Collection Time: 05/11/19  7:48 PM   Specimen: Nasopharyngeal Swab  Result Value Ref Range   SARS Coronavirus 2 NEGATIVE NEGATIVE    Comment: (NOTE) If result is NEGATIVE SARS-CoV-2 target nucleic acids are NOT DETECTED. The SARS-CoV-2 RNA is generally detectable in upper and lower  respiratory specimens during the acute phase of infection. The lowest  concentration of SARS-CoV-2 viral copies this assay can detect is 250  copies / mL. A negative result does not preclude SARS-CoV-2 infection  and should not be used as the sole basis for treatment or other  patient management decisions.  A negative result may occur with  improper specimen collection / handling, submission of specimen other  than nasopharyngeal swab, presence of viral mutation(s) within the  areas targeted by this assay, and inadequate number of viral copies  (<250 copies / mL). A negative result must be combined with clinical  observations, patient history, and epidemiological information. If result is POSITIVE SARS-CoV-2 target nucleic acids are DETECTED. The SARS-CoV-2 RNA is generally detectable in upper and lower  respiratory specimens dur ing the acute phase of infection.  Positive  results are indicative of active infection with SARS-CoV-2.  Clinical  correlation with patient history and other diagnostic  information is  necessary to determine patient infection status.  Positive results do  not rule out bacterial infection or co-infection with other viruses. If result is PRESUMPTIVE POSTIVE SARS-CoV-2 nucleic acids MAY BE PRESENT.   A presumptive positive result was obtained on the submitted specimen  and confirmed on repeat testing.  While 2019 novel coronavirus  (SARS-CoV-2) nucleic acids may be present in the submitted sample  additional confirmatory testing may be necessary for epidemiological  and / or clinical management purposes  to differentiate between  SARS-CoV-2 and other Sarbecovirus currently known to infect humans.  If clinically indicated additional testing with an alternate test  methodology [phone]) is advised. The SARS-CoV-2 RNA is generally  detectable in upper and lower respiratory sp ecimens during the acute  phase of infection. The expected result is Negative. Fact Sheet for Patients:  StrictlyIdeas.no Fact Sheet for Healthcare Providers: BankingDealers.co.za This test is not yet approved or cleared by the [place] FDA and has been authorized for detection and/or diagnosis of SARS-CoV-2 by FDA under an Emergency Use Authorization (EUA).  This EUA will remain in effect (meaning this test can be used) for the duration of the COVID-19 declaration under Section 564(b)(1) of the Act, 21 U.S.C. section 360bbb-3(b)(1), unless the authorization is terminated or revoked sooner. Performed at [facility], [address] [place], [place] [address]   Lactic acid, plasma     Status: None   Collection Time: 05/11/19  9:05 PM  Result Value Ref Range   Lactic Acid, Venous 1.1 0.5 - 1.9 mmol/L    Comment: Performed at [facility], [address], [place], [place] [address]   Dg  Chest Portable 1 View  Result Date: 05/11/2019 CLINICAL DATA:  Shortness of breath EXAM: PORTABLE CHEST 1 VIEW COMPARISON:   05/04/2019, 05/02/2019 FINDINGS: Cardiomegaly. Interval worsening of left greater than right interstitial and ground-glass disease. No large effusion. No pneumothorax. IMPRESSION: Cardiomegaly. Interval worsening of diffuse left greater than right interstitial and ground-glass opacity, which may reflect edema, diffuse pneumonia, or combination of the 2 Electronically Signed   By: Donavan Foil M.D.   On: 05/11/2019 19:21    Pending Labs Unresulted Labs (From admission, onward)    Start     Ordered   05/11/19 2147  Blood gas, arterial ([facility] & [facility] ONLY)  ONCE - STAT,   STAT    Question:  Room air or oxygen  Answer:  Oxygen   05/11/19 2146   05/11/19 1900  Blood culture (routine x 2)  BLOOD CULTURE X 2,   STAT     05/11/19 1900   Signed and Held  HIV antibody (Routine Screening)  Once,   R     Signed and Held   Signed and Held  Culture, blood (Routine X 2) w Reflex to ID Panel  (Severe pneumonia (requires ICU care) in adults without resistant organism risk factors )  BLOOD CULTURE X 2,   STAT     Signed and Held   Signed and Held  Culture, sputum-assessment  (Severe pneumonia (requires ICU care) in adults without resistant organism risk factors )  Once,   STAT     Signed and Held   Signed and Held  Legionella Urine Antigen  (Severe pneumonia (requires ICU care) in adults without resistant organism risk factors )  Once,   STAT     Signed and Held   Signed and Held  Strep pneumoniae urinary antigen  (Severe pneumonia (requires ICU care) in adults without resistant organism risk factors )  Once,   STAT     Signed and Held   Signed and Held  MRSA PCR Screening  (Severe pneumonia (requires ICU care) in adults without resistant organism risk factors )  Once,   R     Signed and Held          Vitals/Pain Today's Vitals   05/11/19 2000 05/11/19 2030 05/11/19 2100 05/11/19 2130  BP: 140/75 (!) 131/59 118/64 (!) 163/69  Pulse: 87 75 69 79  Resp: (!) 21 (!) 23 18 (!) 23  Temp:      TempSrc:       SpO2: 99% 99% 96% 100%  Weight:      Height:      PainSc:        Isolation Precautions No active isolations  Medications Medications  Vancomycin (VANCOCIN) 1,500 mg in sodium chloride 0.9 % 500 mL IVPB (1,500 mg Intravenous New Bag/Given 05/11/19 2012)  dexamethasone (DECADRON) injection 10 mg (10 mg Intravenous Given 05/11/19 1950)  ceFEPIme (MAXIPIME) 2 g in sodium chloride 0.9 % 100 mL IVPB (0 g Intravenous Stopped 05/11/19 2047)  furosemide (LASIX) injection 40 mg (40 mg Intravenous Given 05/11/19 1959)    Mobility walks with device Moderate fall risk   Focused Assessments Pulmonary Assessment Handoff:  Lung sounds: L Breath Sounds: Clear R Breath Sounds: Clear O2 Device: Nasal Cannula O2 Flow Rate (L/min): 4 L/min      R Recommendations: See Admitting Provider Note  Report given to:   Additional Notes:

## 2019-05-11 NOTE — ED Triage Notes (Addendum)
Pt to ED via [facility] from [facility] care with shortness of breath. Per Ems, pt found with low sats 63% on room air. Pt seen to been using accessory muscles with increased work of breathing. Oxygen saturation increased to 100% on 4L [place]. Per pt, chronic use of oxygen of unknown amount.

## 2019-05-11 NOTE — ED Provider Notes (Signed)
[facility] Emergency Department Provider Note  ____________________________________________   First MD Initiated Contact with Patient 05/11/19 1841     (approximate)  I have reviewed the triage vital signs and the nursing notes.   HISTORY  Chief Complaint Shortness of Breath    HPI Holly Conner is a 78 y.o. female here with cough, fever.  The patient reportedly has developed acute onset of shortness of breath, fever, and weakness over the last 24 hours.  She was just recently hospitalized for multiple strokes, as well as recent admission for CHF with pneumonia.  She had been recovering well until the last several days.  She states that she feels incredibly short of breath and weak.  She has reported intermittent confusion due to her recent strokes.  She denies any chest pain.  She states she feels tired and fatigued and has been having chills.  She does not recall any specific fevers.  No alleviating or aggravating factors.  Symptoms do feel somewhat similar to when she is admitted in the past.  She does not recall any increased lower extremity edema.  No other complaints.        Past Medical History:  Diagnosis Date  . Arthritis   . Depression    controlled  . Diabetes mellitus without complication ([place])    managed well;   Marland Kitchen GERD (gastroesophageal reflux disease)   . Hepatitis   . HLD (hyperlipidemia)   . Hypertension    somewhat controlled  . 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 Polymyalgia rheumatica ([place])   . Sleep apnea   . Stroke [facility])     Patient Active Problem List   Diagnosis Date Noted  . Acute respiratory failure ([place]) 05/11/2019  . Acute respiratory failure with hypoxia ([place])   . NSTEMI (non-ST elevated myocardial infarction) ([place]) 05/02/2019  . Memory changes 04/05/2019  . Unresponsive 04/04/2019  . Unresponsive episode 04/03/2019  . Hypoglycemia 04/03/2019  . Elevated troponin 04/03/2019  . Weakness  generalized 03/28/2019  . Hypertensive urgency 12/18/2018  . Stroke ([place]) 12/19/2015  . Type 2 diabetes mellitus ([place]) 12/19/2015  . HTN (hypertension) 12/19/2015  . GERD (gastroesophageal reflux disease) 12/19/2015  . HLD (hyperlipidemia) 12/19/2015  . CKD (chronic kidney disease), stage IV ([place]) 12/19/2015    Past Surgical History:  Procedure Laterality Date  . ABDOMINAL HYSTERECTOMY    . BILATERAL SALPINGOOPHORECTOMY    . ESOPHAGEAL DILATION    . OOPHORECTOMY    . REDUCTION MAMMAPLASTY Bilateral 2003  . REPLACEMENT TOTAL KNEE Right   . TEE WITHOUT CARDIOVERSION N/A 03/31/2019   Procedure: TRANSESOPHAGEAL ECHOCARDIOGRAM (TEE);  Surgeon: Corey Skains, MD;  Location: [facility] ORS;  Service: Cardiovascular;  Laterality: N/A;  . TONSILLECTOMY AND ADENOIDECTOMY      Prior to Admission medications   Medication Sig Start Date End Date Taking? Authorizing Provider  Ascorbic Acid (VITAMIN C) 1000 MG tablet Take 1,000 mg by mouth daily.   Yes [provider]  aspirin EC 81 MG tablet Take 1 tablet (81 mg total) by mouth daily. 04/01/19 03/31/20 Yes Vaughan Basta, MD  atorvastatin (LIPITOR) 80 MG tablet Take 80 mg by mouth daily.    Yes [provider]  insulin aspart (NOVOLOG FLEXPEN) 100 UNIT/ML FlexPen Inject 0-12 Units into the skin See admin instructions. Inject under the skin three times daily with meals according to sliding scale:   200 or below: 0u 201-250: 2u 251-300: 4u 301-350: 6u 351-400:  8u 401-450: 10u 450 or higher: 12u and contact physician   Yes [provider]  Insulin Degludec (TRESIBA FLEXTOUCH) 200 UNIT/ML SOPN Inject 10 Units into the skin daily. 05/05/19  Yes Mody, Ulice Bold, MD  losartan (COZAAR) 50 MG tablet Take 50 mg by mouth daily.   Yes [provider]  metoprolol tartrate (LOPRESSOR) 25 MG tablet Take 1 tablet (25 mg total) by mouth 2 (two) times daily. 05/05/19  Yes Bettey Costa, MD  Multiple Vitamin (MULTIVITAMIN WITH  MINERALS) TABS tablet Take 1 tablet by mouth daily. 04/01/19  Yes Vaughan Basta, MD  pyridOXINE (VITAMIN B-6) 100 MG tablet Take 100 mg by mouth daily.   Yes [provider]  solifenacin (VESICARE) 10 MG tablet Take 1 tablet (10 mg total) by mouth daily. 10/11/18  Yes Stoioff, Ronda Fairly, MD  tamsulosin (FLOMAX) 0.4 MG CAPS capsule Take 1 capsule (0.4 mg total) by mouth at bedtime. 04/01/19  Yes Vaughan Basta, MD  vitamin B-12 (CYANOCOBALAMIN) 100 MCG tablet Take 100 mcg by mouth daily.    Yes [provider]  loratadine (CLARITIN) 10 MG tablet Take 10 mg by mouth daily as needed for allergies.    [provider]    Allergies Iodinated diagnostic agents, Penicillins, Escitalopram, Lithium, and Erythromycin  Family History  Problem Relation Age of Onset  . CAD Brother   . Kidney cancer Mother   . Breast cancer Cousin     Social History Social History   Tobacco Use  . Smoking status: Never Smoker  . Smokeless tobacco: Never Used  Substance Use Topics  . Alcohol use: No    Alcohol/week: 0.0 standard drinks  . Drug use: No    Review of Systems  Review of Systems  Constitutional: Positive for fatigue. Negative for fever.  HENT: Negative for congestion and sore throat.   Eyes: Negative for visual disturbance.  Respiratory: Positive for cough, shortness of breath and wheezing.   Cardiovascular: Positive for leg swelling. Negative for chest pain.  Gastrointestinal: Negative for abdominal pain, diarrhea, nausea and vomiting.  Genitourinary: Negative for flank pain.  Musculoskeletal: Negative for back pain and neck pain.  Skin: Negative for rash and wound.  Neurological: Positive for weakness.  All other systems reviewed and are negative.    ____________________________________________  PHYSICAL EXAM:      VITAL SIGNS: ED Triage Vitals  Enc Vitals Group     BP 05/11/19 1844 (!) 127/57     Pulse Rate 05/11/19 1844 93     Resp 05/11/19  1844 (!) 26     Temp 05/11/19 1844 98.5 F (36.9 C)     Temp Source 05/11/19 1844 Oral     SpO2 05/11/19 1844 95 %     Weight 05/11/19 1847 175 lb 1.6 oz (79.4 kg)     Height 05/11/19 1847 5\' 1"  (1.549 m)     Head Circumference --      Peak Flow --      Pain Score 05/11/19 1846 0     Pain Loc --      Pain Edu? --      Excl. in [place]? --      Physical Exam Vitals signs and nursing note reviewed.  Constitutional:      General: She is not in acute distress.    Appearance: She is well-developed.  HENT:     Head: Normocephalic and atraumatic.  Eyes:     Conjunctiva/sclera: Conjunctivae normal.  Neck:     Musculoskeletal: Neck  supple.  Cardiovascular:     Rate and Rhythm: Normal rate and regular rhythm.     Heart sounds: Normal heart sounds. No murmur. No friction rub.  Pulmonary:     Effort: Tachypnea and respiratory distress present.     Breath sounds: Examination of the right-upper field reveals rales. Examination of the left-upper field reveals rales. Examination of the right-middle field reveals rales. Examination of the left-middle field reveals rales. Examination of the right-lower field reveals rales. Examination of the left-lower field reveals rales. Wheezing and rales present.  Abdominal:     General: There is no distension.     Palpations: Abdomen is soft.     Tenderness: There is no abdominal tenderness.  Musculoskeletal:     Right lower leg: Edema present.     Left lower leg: Edema present.  Skin:    General: Skin is warm.     Capillary Refill: Capillary refill takes less than 2 seconds.  Neurological:     Mental Status: She is alert and oriented to person, place, and time.     Motor: No abnormal muscle tone.       ____________________________________________   LABS (all labs ordered are listed, but only abnormal results are displayed)  Labs Reviewed  CBC WITH DIFFERENTIAL/PLATELET - Abnormal; Notable for the following components:      Result Value   WBC  17.2 (*)    RBC 2.90 (*)    Hemoglobin 8.8 (*)    HCT 27.0 (*)    Neutro Abs 15.7 (*)    Abs Immature Granulocytes 0.10 (*)    All other components within normal limits  COMPREHENSIVE METABOLIC PANEL - Abnormal; Notable for the following components:   CO2 19 (*)    Glucose, Bld 296 (*)    BUN 44 (*)    Creatinine, Ser 2.89 (*)    Calcium 8.7 (*)    Total Protein 6.1 (*)    Albumin 2.8 (*)    GFR calc non Af Amer 15 (*)    GFR calc Af Amer 17 (*)    All other components within normal limits  BRAIN NATRIURETIC PEPTIDE - Abnormal; Notable for the following components:   B Natriuretic Peptide 3,933.0 (*)    All other components within normal limits  LACTIC ACID, PLASMA - Abnormal; Notable for the following components:   Lactic Acid, Venous 2.2 (*)    All other components within normal limits  BLOOD GAS, ARTERIAL - Abnormal; Notable for the following components:   pCO2 arterial 30 (*)    Bicarbonate 17.7 (*)    Acid-base deficit 6.2 (*)    All other components within normal limits  GLUCOSE, CAPILLARY - Abnormal; Notable for the following components:   Glucose-Capillary 445 (*)    All other components within normal limits  GLUCOSE, CAPILLARY - Abnormal; Notable for the following components:   Glucose-Capillary 496 (*)    All other components within normal limits  TROPONIN I (HIGH SENSITIVITY) - Abnormal; Notable for the following components:   Troponin I (High Sensitivity) 110 (*)    All other components within normal limits  TROPONIN I (HIGH SENSITIVITY) - Abnormal; Notable for the following components:   Troponin I (High Sensitivity) 144 (*)    All other components within normal limits  SARS CORONAVIRUS 2 (HOSPITAL ORDER, [place] [facility])  CULTURE, BLOOD (ROUTINE X 2)  CULTURE, BLOOD (ROUTINE X 2)  EXPECTORATED SPUTUM ASSESSMENT W REFEX TO RESP CULTURE  MRSA PCR SCREENING  LACTIC ACID, PLASMA  PROCALCITONIN  PROCALCITONIN  HIV ANTIBODY (ROUTINE  TESTING W REFLEX)  LEGIONELLA PNEUMOPHILA SEROGP 1 UR AG  STREP PNEUMONIAE URINARY ANTIGEN    ____________________________________________  EKG: Sinus rhythm, ventricular rate 93.  LVH with intraventricular conduction delay.  Borderline prolonged QTC.  No ST elevations or depressions.  Nonspecific T wave inversions. ________________________________________  RADIOLOGY All imaging, including plain films, CT scans, and ultrasounds, independently reviewed by me, and interpretations confirmed via formal radiology reads.  ED MD interpretation:   CXR: Multifocal PNA with likely superimposed CHF  Official radiology report(s): Dg Chest Portable 1 View  Result Date: 05/11/2019 CLINICAL DATA:  Shortness of breath EXAM: PORTABLE CHEST 1 VIEW COMPARISON:  05/04/2019, 05/02/2019 FINDINGS: Cardiomegaly. Interval worsening of left greater than right interstitial and ground-glass disease. No large effusion. No pneumothorax. IMPRESSION: Cardiomegaly. Interval worsening of diffuse left greater than right interstitial and ground-glass opacity, which may reflect edema, diffuse pneumonia, or combination of the 2 Electronically Signed   By: Donavan Foil M.D.   On: 05/11/2019 19:21    ____________________________________________  PROCEDURES   Procedure(s) performed (including Critical Care):  .Critical Care Performed by: Duffy Bruce, MD Authorized by: Lavonia Drafts, MD   Critical care provider statement:    Critical care time (minutes):  35   Critical care time was exclusive of:  Separately billable procedures and treating other patients and teaching time   Critical care was necessary to treat or prevent imminent or life-threatening deterioration of the following conditions:  Circulatory failure, cardiac failure, sepsis and respiratory failure   Critical care was time spent personally by me on the following activities:  Development of treatment plan with patient or surrogate, discussions with  consultants, evaluation of patient's response to treatment, examination of patient, obtaining history from patient or surrogate, ordering and performing treatments and interventions, ordering and review of laboratory studies, ordering and review of radiographic studies, pulse oximetry, re-evaluation of patient's condition and review of old charts   I assumed direction of critical care for this patient from another provider in my specialty: no      ____________________________________________  INITIAL IMPRESSION / MDM / [place] / ED COURSE  As part of my medical decision making, I reviewed the following data within the electronic MEDICAL RECORD NUMBER Notes from prior ED visits and [place] Controlled Substance Database      *Irelynn Clopton was evaluated in Emergency Department on 05/12/2019 for the symptoms described in the history of present illness. She was evaluated in the context of the global COVID-19 pandemic, which necessitated consideration that the patient might be at risk for infection with the SARS-CoV-2 virus that causes COVID-19. Institutional protocols and algorithms that pertain to the evaluation of patients at risk for COVID-19 are in a state of rapid change based on information released by regulatory bodies including the CDC and federal and state organizations. These policies and algorithms were followed during the patient's care in the ED.  Some ED evaluations and interventions may be delayed as a result of limited staffing during the pandemic.*   Clinical Course as of May 11 116  Nancy Fetter May 11, 6423  6055 78 year old female here with severe shortness of breath, tachypnea, and mild acute on chronic hypoxia.  Imaging shows severe multifocal pneumonia versus but likely concomitant with CHF.  Patient has significant leukocytosis, mild acute on chronic kidney injury, and markedly elevated BNP.   [CI]    Clinical Course User Index [CI] Duffy Bruce, MD  Medical Decision  Making: As above. Pt improving on BIPAP which is reassuring, though CXR is highly concerning. Admit for resp failure 2/2 PNA, CHF. Lasix, IV ABX given.  Regarding goals of care, I had a long discussion with pt's HCPOA. At this time, pt is full code but had expressed previously that she would not want to prolong life artificially if her condition was deemed to not be recoverable. For now, HCPOA would prefer full code with short term intubation if needed, with reassessment thereafter. ____________________________________________  FINAL CLINICAL IMPRESSION(S) / ED DIAGNOSES  Final diagnoses:  Acute on chronic systolic congestive heart failure (HCC)  Multifocal pneumonia  Acute on chronic respiratory failure with hypoxia ([place])     MEDICATIONS GIVEN DURING THIS VISIT:  Medications  vitamin B-12 (CYANOCOBALAMIN) tablet 100 mcg (has no administration in time range)  pyridOXINE (VITAMIN B-6) tablet 100 mg (has no administration in time range)  vitamin C (ASCORBIC ACID) tablet 1,000 mg (has no administration in time range)  darifenacin (ENABLEX) 24 hr tablet 7.5 mg (has no administration in time range)  atorvastatin (LIPITOR) tablet 80 mg (has no administration in time range)  loratadine (CLARITIN) tablet 10 mg (has no administration in time range)  losartan (COZAAR) tablet 50 mg (has no administration in time range)  aspirin EC tablet 81 mg (has no administration in time range)  tamsulosin (FLOMAX) capsule 0.4 mg (0.4 mg Oral Not Given 05/12/19 0009)  multivitamin with minerals tablet 1 tablet (has no administration in time range)  metoprolol tartrate (LOPRESSOR) tablet 25 mg (25 mg Oral Not Given 05/12/19 0009)  insulin glargine (LANTUS) injection 10 Units (has no administration in time range)  ceFEPIme (MAXIPIME) 2 g in sodium chloride 0.9 % 100 mL IVPB (has no administration in time range)  vancomycin (VANCOCIN) 500 mg in sodium chloride 0.9 % 100 mL IVPB (has no administration in time range)   furosemide (LASIX) injection 20 mg (has no administration in time range)  Chlorhexidine Gluconate Cloth 2 % PADS 6 each (has no administration in time range)  dextrose 5 %-0.45 % sodium chloride infusion (has no administration in time range)  insulin regular bolus via infusion 0-10 Units (has no administration in time range)  insulin regular, human (MYXREDLIN) 100 units/ 100 mL infusion (4.4 Units/hr Intravenous New Bag/Given 05/12/19 0104)  dextrose 50 % solution 25 mL (has no administration in time range)  0.9 %  sodium chloride infusion (has no administration in time range)  dexamethasone (DECADRON) injection 10 mg (10 mg Intravenous Given 05/11/19 1950)  ceFEPIme (MAXIPIME) 2 g in sodium chloride 0.9 % 100 mL IVPB (0 g Intravenous Stopped 05/11/19 2047)  Vancomycin (VANCOCIN) 1,500 mg in sodium chloride 0.9 % 500 mL IVPB (0 mg Intravenous Stopped 05/11/19 2228)  furosemide (LASIX) injection 40 mg (40 mg Intravenous Given 05/11/19 1959)     ED Discharge Orders    None       Note:  This document was prepared using Dragon voice recognition software and may include unintentional dictation errors.   Duffy Bruce, MD 05/12/19 [phone]

## 2019-05-11 NOTE — Consult Note (Signed)
PHARMACY -  BRIEF ANTIBIOTIC NOTE   Pharmacy has received consult(s) for Vancomycin/Cefepime from an ED provider.  The patient's profile has been reviewed for ht/wt/allergies/indication/available labs.    One time order(s) placed for Vancomycin 1500mg  IV x 1 and  Cefepime 2g x1  Further antibiotics/pharmacy consults should be ordered by admitting physician if indicated.                       Thank you,  Lu Duffel, PharmD, BCPS Clinical Pharmacist 05/11/2019 7:40 PM

## 2019-05-12 ENCOUNTER — Ambulatory Visit: Payer: Medicare Other | Admitting: Family

## 2019-05-12 DIAGNOSIS — J810 Acute pulmonary edema: Secondary | ICD-10-CM

## 2019-05-12 DIAGNOSIS — J9601 Acute respiratory failure with hypoxia: Secondary | ICD-10-CM

## 2019-05-12 LAB — BASIC METABOLIC PANEL
Anion gap: 11 (ref 5–15)
BUN: 50 mg/dL — ABNORMAL HIGH (ref 8–23)
CO2: 19 mmol/L — ABNORMAL LOW (ref 22–32)
Calcium: 8.6 mg/dL — ABNORMAL LOW (ref 8.9–10.3)
Chloride: 105 mmol/L (ref 98–111)
Creatinine, Ser: 3.14 mg/dL — ABNORMAL HIGH (ref 0.44–1.00)
GFR calc Af Amer: 15 mL/min — ABNORMAL LOW (ref >60)
GFR calc non Af Amer: 13 mL/min — ABNORMAL LOW (ref >60)
Glucose, Bld: 354 mg/dL — ABNORMAL HIGH (ref 70–99)
Potassium: 4.2 mmol/L (ref 3.5–5.1)
Sodium: 135 mmol/L (ref 135–145)

## 2019-05-12 LAB — GLUCOSE, CAPILLARY
Glucose-Capillary: 107 mg/dL — ABNORMAL HIGH (ref 70–99)
Glucose-Capillary: 108 mg/dL — ABNORMAL HIGH (ref 70–99)
Glucose-Capillary: 111 mg/dL — ABNORMAL HIGH (ref 70–99)
Glucose-Capillary: 121 mg/dL — ABNORMAL HIGH (ref 70–99)
Glucose-Capillary: 168 mg/dL — ABNORMAL HIGH (ref 70–99)
Glucose-Capillary: 214 mg/dL — ABNORMAL HIGH (ref 70–99)
Glucose-Capillary: 232 mg/dL — ABNORMAL HIGH (ref 70–99)
Glucose-Capillary: 273 mg/dL — ABNORMAL HIGH (ref 70–99)
Glucose-Capillary: 342 mg/dL — ABNORMAL HIGH (ref 70–99)
Glucose-Capillary: 373 mg/dL — ABNORMAL HIGH (ref 70–99)
Glucose-Capillary: 401 mg/dL — ABNORMAL HIGH (ref 70–99)
Glucose-Capillary: 422 mg/dL — ABNORMAL HIGH (ref 70–99)
Glucose-Capillary: 445 mg/dL — ABNORMAL HIGH (ref 70–99)
Glucose-Capillary: 479 mg/dL — ABNORMAL HIGH (ref 70–99)
Glucose-Capillary: 496 mg/dL — ABNORMAL HIGH (ref 70–99)

## 2019-05-12 LAB — PHOSPHORUS: Phosphorus: 3.8 mg/dL (ref 2.5–4.6)

## 2019-05-12 LAB — PROCALCITONIN: Procalcitonin: 17.31 ng/mL

## 2019-05-12 LAB — MAGNESIUM: Magnesium: 1.7 mg/dL (ref 1.7–2.4)

## 2019-05-12 LAB — MRSA PCR SCREENING: MRSA by PCR: NEGATIVE

## 2019-05-12 MED ORDER — DEXTROSE 50 % IV SOLN: 25.0000 mL | INTRAVENOUS | Status: DC | PRN

## 2019-05-12 MED ORDER — INSULIN DETEMIR 100 UNIT/ML ~~LOC~~ SOLN
7.0000 [IU] | Freq: Every day | SUBCUTANEOUS | Status: DC
  Administered 2019-05-12 – 2019-05-13 (×2): 7 [IU] via SUBCUTANEOUS
  Filled 2019-05-12 (×4): qty 0.07

## 2019-05-12 MED ORDER — INSULIN ASPART 100 UNIT/ML ~~LOC~~ SOLN
0.0000 [IU] | Freq: Every day | SUBCUTANEOUS | Status: DC
  Administered 2019-05-14: 22:00:00 3 [IU] via SUBCUTANEOUS
  Administered 2019-05-15: 22:00:00 2 [IU] via SUBCUTANEOUS
  Filled 2019-05-12 (×3): qty 1

## 2019-05-12 MED ORDER — INSULIN ASPART 100 UNIT/ML ~~LOC~~ SOLN
0.0000 [IU] | Freq: Three times a day (TID) | SUBCUTANEOUS | Status: DC
  Administered 2019-05-12 – 2019-05-13 (×2): 3 [IU] via SUBCUTANEOUS
  Administered 2019-05-13 (×2): 5 [IU] via SUBCUTANEOUS
  Administered 2019-05-14: 2 [IU] via SUBCUTANEOUS
  Administered 2019-05-14: 13:00:00 7 [IU] via SUBCUTANEOUS
  Administered 2019-05-14: 5 [IU] via SUBCUTANEOUS
  Administered 2019-05-15: 9 [IU] via SUBCUTANEOUS
  Administered 2019-05-15: 09:00:00 3 [IU] via SUBCUTANEOUS
  Administered 2019-05-16: 12:00:00 5 [IU] via SUBCUTANEOUS
  Filled 2019-05-12 (×12): qty 1

## 2019-05-12 MED ORDER — HEPARIN SODIUM (PORCINE) 5000 UNIT/ML IJ SOLN
5000.0000 [IU] | Freq: Three times a day (TID) | INTRAMUSCULAR | Status: DC
  Administered 2019-05-12 – 2019-05-16 (×12): 5000 [IU] via SUBCUTANEOUS
  Filled 2019-05-12 (×12): qty 1

## 2019-05-12 MED ORDER — SODIUM CHLORIDE 0.9 % IV SOLN: INTRAVENOUS | Status: DC

## 2019-05-12 MED ORDER — DEXTROSE-NACL 5-0.45 % IV SOLN: INTRAVENOUS | Status: DC

## 2019-05-12 MED ORDER — MAGNESIUM SULFATE IN D5W 1-5 GM/100ML-% IV SOLN
1.0000 g | Freq: Once | INTRAVENOUS | Status: AC
  Administered 2019-05-12: 1 g via INTRAVENOUS
  Filled 2019-05-12: qty 100

## 2019-05-12 MED ORDER — INSULIN REGULAR(HUMAN) IN NACL 100-0.9 UT/100ML-% IV SOLN
INTRAVENOUS | Status: DC
  Administered 2019-05-12: 1.8 [IU]/h via INTRAVENOUS
  Administered 2019-05-12: 4.4 [IU]/h via INTRAVENOUS
  Filled 2019-05-12 (×2): qty 100

## 2019-05-12 MED ORDER — INSULIN REGULAR BOLUS VIA INFUSION
0.0000 [IU] | Freq: Three times a day (TID) | INTRAVENOUS | Status: DC
  Filled 2019-05-12: qty 10

## 2019-05-12 NOTE — Consult Note (Signed)
PULMONARY / CRITICAL CARE MEDICINE  Name: Holly Conner MRN: [id number] DOB: 05-26-1941    LOS: 1  Referring Provider: Dr. Manuella Ghazi Reason for Referral: Acute hypoxic respiratory failure requiring BiPAP  HPI: 78 year old female with a medical history as indicated below who presented to the ED via EMS from [place] with complaints of shortness of breath and hypoxia.  When EMS arrived, patient's SPO2 was 63% on room air and she was using accessory muscles.  She was placed on oxygen at 4 L and transferred to the emergency room.  At the ED patient continued to be confused, tachypneic and hypoxic hence she was placed on BiPAP.  She was also febrile and very weak.  She has had recent hospitalizations for multiple strokes, CHF and pneumonia.  Her chest x-ray in the ED showed interval worsening of diffuse groundglass opacities and interstitial edema left greater than right suggestive of both acute pulmonary edema and possible pneumonia.  Her WBC was 17.2 with a hemoglobin of 8.8 and hematocrit of 27.  Her procalcitonin was initially 5.32 and increased to 17.31.  Her lactic acid was 2.2 and her proBNP was 3933.  Her initial troponin was 110 and peaked at 144.  Being admitted to the ICU for further management. Of note patient was recently hospitalized on 05/02/2019 with an NSTEMI and multifocal pneumonia Past Medical History:  Diagnosis Date  . Arthritis   . Depression    controlled  . Diabetes mellitus without complication ([place])    managed well;   Marland Kitchen GERD (gastroesophageal reflux disease)   . Hepatitis   . HLD (hyperlipidemia)   . Hypertension    somewhat controlled  . 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 Polymyalgia rheumatica ([place])   . Sleep apnea   . Stroke [facility])    Past Surgical History:  Procedure Laterality Date  . ABDOMINAL HYSTERECTOMY    . BILATERAL SALPINGOOPHORECTOMY    . ESOPHAGEAL DILATION    . OOPHORECTOMY    . REDUCTION MAMMAPLASTY Bilateral  2003  . REPLACEMENT TOTAL KNEE Right   . TEE WITHOUT CARDIOVERSION N/A 03/31/2019   Procedure: TRANSESOPHAGEAL ECHOCARDIOGRAM (TEE);  Surgeon: Corey Skains, MD;  Location: [facility] ORS;  Service: Cardiovascular;  Laterality: N/A;  . TONSILLECTOMY AND ADENOIDECTOMY     No current facility-administered medications on file prior to encounter.    Current Outpatient Medications on File Prior to Encounter  Medication Sig  . Ascorbic Acid (VITAMIN C) 1000 MG tablet Take 1,000 mg by mouth daily.  Marland Kitchen aspirin EC 81 MG tablet Take 1 tablet (81 mg total) by mouth daily.  Marland Kitchen atorvastatin (LIPITOR) 80 MG tablet Take 80 mg by mouth daily.   . insulin aspart (NOVOLOG FLEXPEN) 100 UNIT/ML FlexPen Inject 0-12 Units into the skin See admin instructions. Inject under the skin three times daily with meals according to sliding scale:   200 or below: 0u 201-250: 2u 251-300: 4u 301-350: 6u 351-400: 8u 401-450: 10u 450 or higher: 12u and contact physician  . Insulin Degludec (TRESIBA FLEXTOUCH) 200 UNIT/ML SOPN Inject 10 Units into the skin daily.  Marland Kitchen losartan (COZAAR) 50 MG tablet Take 50 mg by mouth daily.  . metoprolol tartrate (LOPRESSOR) 25 MG tablet Take 1 tablet (25 mg total) by mouth 2 (two) times daily.  . Multiple Vitamin (MULTIVITAMIN WITH MINERALS) TABS tablet Take 1 tablet by mouth daily.  Marland Kitchen pyridOXINE (VITAMIN B-6) 100 MG tablet Take 100 mg by mouth daily.  . solifenacin (  VESICARE) 10 MG tablet Take 1 tablet (10 mg total) by mouth daily.  . tamsulosin (FLOMAX) 0.4 MG CAPS capsule Take 1 capsule (0.4 mg total) by mouth at bedtime.  . vitamin B-12 (CYANOCOBALAMIN) 100 MCG tablet Take 100 mcg by mouth daily.   Marland Kitchen loratadine (CLARITIN) 10 MG tablet Take 10 mg by mouth daily as needed for allergies.    Allergies Allergies  Allergen Reactions  . Iodinated Diagnostic Agents Shortness Of Breath    Only with nuclear stress test per pt Only with nuclear stress test per pt   . Penicillins Rash    Rash  at injection site. Pt states she can take Amoxicillin & Keflex   . Escitalopram   . Lithium   . Erythromycin Nausea Only and Nausea And Vomiting    Other reaction(s): Other (See Comments) dehydration     Family History Family History  Problem Relation Age of Onset  . CAD Brother   . Kidney cancer Mother   . Breast cancer Cousin    Social History  reports that she has never smoked. She has never used smokeless tobacco. She reports that she does not drink alcohol or use drugs.  Review Of Systems: Unable to obtain as patient is on continuous BiPAP  VITAL SIGNS: BP (!) 127/53   Pulse 65   Temp (!) 101.4 F (38.6 C) (Rectal)   Resp 16   Ht 5\' 1"  (1.549 m)   Wt 79.4 kg   SpO2 100%   BMI 33.08 kg/m   HEMODYNAMICS:    VENTILATOR SETTINGS:    INTAKE / OUTPUT: I/O last 3 completed shifts: In: 501.1 [IV Piggyback:501.1] Out: -   PHYSICAL EXAMINATION: General: Chronically ill looking, in moderate respiratory distress HEENT: PERRLA, trachea midline, mild JVD Neuro: Alert and oriented x1, moves all extremities, follows some basic commands Cardiovascular: Apical pulse regular, S1-S2, no murmur regurg or gallop, +2 pulses bilaterally, no edema Lungs: Mild increase in work of breathing, bilateral breath sounds, diminished in the bases, and fine crackles in all lung fields Abdomen: Nondistended, normal bowel sounds in all 4 quadrants, palpation reveals no organomegaly Musculoskeletal: Positive range of motion, no joint deformities Skin: Warm and dry  LABS:  BMET Recent Labs  Lab 05/11/19 1857 05/12/19 0513  NA 135 135  K 4.9 4.2  CL 104 105  CO2 19* 19*  BUN 44* 50*  CREATININE 2.89* 3.14*  GLUCOSE 296* 354*    Electrolytes Recent Labs  Lab 05/11/19 1857 05/12/19 0513  CALCIUM 8.7* 8.6*  MG  --  1.7  PHOS  --  3.8    CBC Recent Labs  Lab 05/11/19 1857  WBC 17.2*  HGB 8.8*  HCT 27.0*  PLT 351    Coag's No results for input(s): APTT, INR in the  last 168 hours.  Sepsis Markers Recent Labs  Lab 05/11/19 1857 05/11/19 2105 05/12/19 0513  LATICACIDVEN 2.2* 1.1  --   PROCALCITON  --  5.32 17.31    ABG Recent Labs  Lab 05/11/19 2202  PHART 7.38  PCO2ART 30*  PO2ART 97    Liver Enzymes Recent Labs  Lab 05/11/19 1857  AST 27  ALT 20  ALKPHOS 76  BILITOT 1.1  ALBUMIN 2.8*    Cardiac Enzymes No results for input(s): TROPONINI, PROBNP in the last 168 hours.  Glucose Recent Labs  Lab 05/12/19 0304 05/12/19 0356 05/12/19 0409 05/12/19 0458 05/12/19 0604 05/12/19 0713  GLUCAP 422* 401* 373* 342* 273* 214*    Imaging  Dg Chest Portable 1 View  Result Date: 05/11/2019 CLINICAL DATA:  Shortness of breath EXAM: PORTABLE CHEST 1 VIEW COMPARISON:  05/04/2019, 05/02/2019 FINDINGS: Cardiomegaly. Interval worsening of left greater than right interstitial and ground-glass disease. No large effusion. No pneumothorax. IMPRESSION: Cardiomegaly. Interval worsening of diffuse left greater than right interstitial and ground-glass opacity, which may reflect edema, diffuse pneumonia, or combination of the 2 Electronically Signed   By: Donavan Foil M.D.   On: 05/11/2019 19:21    STUDIES:  03/31/2019 IMPRESSIONS    1. The left ventricle has low normal systolic function, with an ejection fraction of 50-55%. The cavity size was normal.  2. The right ventricle has normal systolc function. The cavity was normal. There is no increase in right ventricular wall thickness.  3. Mild thickening of the mitral valve leaflet. Mild calcification of the mitral valve leaflet. No mitral valve vegetation visualized.  4. No trisuspid valve vegetation visualized.  5. Mild thickening of the aortic valve.  6. No vegetation on the aortic valve.  7. There is evidence of plaque in the ascending aorta and aortic arch.  CULTURES: Blood cultures x2 Urine cultures  ANTIBIOTICS: Vancomycin>> Cefepime>>  SIGNIFICANT EVENTS: 05/11/2019:  Admitted  LINES/TUBES: Peripheral IVs  DISCUSSION: 78 year old female presenting with acute hypoxic respiratory failure secondary to acute pulmonary edema and pneumonia  ASSESSMENT  Acute hypoxic respiratory failure secondary to pneumonia and acute pulmonary edema Acute pulmonary edema Healthcare acquired pneumonia Sepsis secondary to pneumonia Chronic kidney disease stage III Type 2 diabetes mellitus Anemia of chronic disease  PLAN Continues BiPAP and titrate to nasal cannula as tolerated Hemodynamics per ICU protocol IV diuresis Broad-spectrum antibiotics as above Trend procalcitonin and adjust antibiotics Blood glucose monitoring with sliding scale insulin coverage ABG and chest x-ray as needed Trend renal indices and correct electrolytes Monitor hemoglobin and hematocrit and transfuse for hemoglobin less than 7 Continue all home medications  Best Practice: Code Status: Full code Diet: N.p.o. while on BiPAP and carb modified diet once off BiPAP GI prophylaxis: Not indicated VTE prophylaxis: SCDs and and subcu heparin  FAMILY  - Updates: Patient and family updated on current treatment plan by ED and admitting service.  Will update with any changes in treatment plan   S. Genoveva Ill, ANP -[facility] [place] [facility] [place] Pager [phone] or [phone]  NB: This document was prepared using Dragon voice recognition software and may include unintentional dictation errors.    05/12/2019, 7:59 AM

## 2019-05-12 NOTE — Consult Note (Signed)
Reason for Consult: Shortness of breath respiratory failure Referring Physician: Dr. Netty Starring primary Dr. Manuella Ghazi hospitalist  Holly Conner is an 77 y.o. female.  HPI: Patient is a 78 year old female I would drink not tonight not tonight.  Patient has history of acute on chronic respiratory failure with hypoxemia presented with dyspnea shortness of breath low-grade temperature and cough chest x-ray had diffuse nonspecific findings consistent with possible infiltrate versus atelectasis was present antibiotic therapy supplemental oxygen in place intensive care unit for respiratory support patient was placed on BiPAP with significant improvement in symptoms.  Patient denies any significant pain complain of generalized weakness fatigue  Past Medical History:  Diagnosis Date  . Arthritis   . Depression    controlled  . Diabetes mellitus without complication ([place])    managed well;   Marland Kitchen GERD (gastroesophageal reflux disease)   . Hepatitis   . HLD (hyperlipidemia)   . Hypertension    somewhat controlled  . 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 Polymyalgia rheumatica ([place])   . Sleep apnea   . Stroke [facility])     Past Surgical History:  Procedure Laterality Date  . ABDOMINAL HYSTERECTOMY    . BILATERAL SALPINGOOPHORECTOMY    . ESOPHAGEAL DILATION    . OOPHORECTOMY    . REDUCTION MAMMAPLASTY Bilateral 2003  . REPLACEMENT TOTAL KNEE Right   . TEE WITHOUT CARDIOVERSION N/A 03/31/2019   Procedure: TRANSESOPHAGEAL ECHOCARDIOGRAM (TEE);  Surgeon: Corey Skains, MD;  Location: [facility] ORS;  Service: Cardiovascular;  Laterality: N/A;  . TONSILLECTOMY AND ADENOIDECTOMY      Family History  Problem Relation Age of Onset  . CAD Brother   . Kidney cancer Mother   . Breast cancer Cousin     Social History:  reports that she has never smoked. She has never used smokeless tobacco. She reports that she does not drink alcohol or use drugs.  Allergies:  Allergies  Allergen  Reactions  . Iodinated Diagnostic Agents Shortness Of Breath    Only with nuclear stress test per pt Only with nuclear stress test per pt   . Penicillins Rash    Rash at injection site. Pt states she can take Amoxicillin & Keflex   . Escitalopram   . Lithium   . Erythromycin Nausea Only and Nausea And Vomiting    Other reaction(s): Other (See Comments) dehydration     Medications: I have reviewed the patient's current medications.  Results for orders placed or performed during the hospital encounter of 05/11/19 (from the past 48 hour(s))  CBC with Differential     Status: Abnormal   Collection Time: 05/11/19  6:57 PM  Result Value Ref Range   WBC 17.2 (H) 4.0 - 10.5 K/uL   RBC 2.90 (L) 3.87 - 5.11 MIL/uL   Hemoglobin 8.8 (L) 12.0 - 15.0 g/dL   HCT 27.0 (L) 36.0 - 46.0 %   MCV 93.1 80.0 - 100.0 fL   MCH 30.3 26.0 - 34.0 pg   MCHC 32.6 30.0 - 36.0 g/dL   RDW 15.0 11.5 - 15.5 %   Platelets 351 150 - 400 K/uL   nRBC 0.0 0.0 - 0.2 %   Neutrophils Relative % 91 %   Neutro Abs 15.7 (H) 1.7 - 7.7 K/uL   Lymphocytes Relative 5 %   Lymphs Abs 0.8 0.7 - 4.0 K/uL   Monocytes Relative 3 %   Monocytes Absolute 0.6 0.1 - 1.0 K/uL   Eosinophils Relative 0 %  Eosinophils Absolute 0.0 0.0 - 0.5 K/uL   Basophils Relative 0 %   Basophils Absolute 0.0 0.0 - 0.1 K/uL   Immature Granulocytes 1 %   Abs Immature Granulocytes 0.10 (H) 0.00 - 0.07 K/uL    Comment: Performed at [facility], [address] [place], [place] [address]  Comprehensive metabolic panel     Status: Abnormal   Collection Time: 05/11/19  6:57 PM  Result Value Ref Range   Sodium 135 135 - 145 mmol/L   Potassium 4.9 3.5 - 5.1 mmol/L   Chloride 104 98 - 111 mmol/L   CO2 19 (L) 22 - 32 mmol/L   Glucose, Bld 296 (H) 70 - 99 mg/dL   BUN 44 (H) 8 - 23 mg/dL   Creatinine, Ser 2.89 (H) 0.44 - 1.00 mg/dL   Calcium 8.7 (L) 8.9 - 10.3 mg/dL   Total Protein 6.1 (L) 6.5 - 8.1 g/dL   Albumin 2.8 (L) 3.5 - 5.0 g/dL    AST 27 15 - 41 U/L   ALT 20 0 - 44 U/L   Alkaline Phosphatase 76 38 - 126 U/L   Total Bilirubin 1.1 0.3 - 1.2 mg/dL   GFR calc non Af Amer 15 (L) >60 mL/min   GFR calc Af Amer 17 (L) >60 mL/min   Anion gap 12 5 - 15    Comment: Performed at [facility], [address] [place], [place] [address]  Brain natriuretic peptide     Status: Abnormal   Collection Time: 05/11/19  6:57 PM  Result Value Ref Range   B Natriuretic Peptide 3,933.0 (H) 0.0 - 100.0 pg/mL    Comment: Performed at [facility], [address] [place], [place] [address]  Troponin I (High Sensitivity)     Status: Abnormal   Collection Time: 05/11/19  6:57 PM  Result Value Ref Range   Troponin I (High Sensitivity) 110 (HH) <18 ng/L    Comment: CRITICAL RESULT CALLED TO, READ BACK BY AND VERIFIED WITH REBECCA LYNN @1939  ON 05/11/2019 BY FMW (NOTE) Elevated high sensitivity troponin I (hsTnI) values and significant  changes across serial measurements may suggest ACS but many other  chronic and acute conditions are known to elevate hsTnI results.  Refer to the "Links" section for chest pain algorithms and additional  guidance. Performed at [facility], [address] [place], [place] [address]   Lactic acid, plasma     Status: Abnormal   Collection Time: 05/11/19  6:57 PM  Result Value Ref Range   Lactic Acid, Venous 2.2 (HH) 0.5 - 1.9 mmol/L    Comment: CRITICAL RESULT CALLED TO, READ BACK BY AND VERIFIED WITH REBECCA LYNN @1932  ON 05/11/2019 BY FMW Performed at [facility], [address] [place], [place] [address]   Blood culture (routine x 2)     Status: None (Preliminary result)   Collection Time: 05/11/19  6:57 PM   Specimen: BLOOD  Result Value Ref Range   Specimen Description BLOOD BLOOD LEFT FOREARM    Special Requests      BOTTLES DRAWN AEROBIC AND ANAEROBIC Blood Culture adequate volume   Culture      NO GROWTH < 12 HOURS Performed at [facility], [address], [place], [place] [address]    Report Status PENDING   Blood culture (routine x 2)     Status: None (Preliminary result)   Collection Time: 05/11/19  6:58 PM   Specimen: BLOOD  Result Value  Ref Range   Specimen Description BLOOD RIGHT ARM    Special Requests      BOTTLES DRAWN AEROBIC AND ANAEROBIC Blood Culture adequate volume   Culture      NO GROWTH < 12 HOURS Performed at [facility], [address] [place], [place] [address]    Report Status PENDING   SARS Coronavirus 2 [facility] order, Performed in [facility] hospital lab) Nasopharyngeal Nasopharyngeal Swab     Status: None   Collection Time: 05/11/19  7:48 PM   Specimen: Nasopharyngeal Swab  Result Value Ref Range   SARS Coronavirus 2 NEGATIVE NEGATIVE    Comment: (NOTE) If result is NEGATIVE SARS-CoV-2 target nucleic acids are NOT DETECTED. The SARS-CoV-2 RNA is generally detectable in upper and lower  respiratory specimens during the acute phase of infection. The lowest  concentration of SARS-CoV-2 viral copies this assay can detect is 250  copies / mL. A negative result does not preclude SARS-CoV-2 infection  and should not be used as the sole basis for treatment or other  patient management decisions.  A negative result may occur with  improper specimen collection / handling, submission of specimen other  than nasopharyngeal swab, presence of viral mutation(s) within the  areas targeted by this assay, and inadequate number of viral copies  (<250 copies / mL). A negative result must be combined with clinical  observations, patient history, and epidemiological information. If result is POSITIVE SARS-CoV-2 target nucleic acids are DETECTED. The SARS-CoV-2 RNA is generally detectable in upper and lower  respiratory specimens dur ing the acute phase of infection.  Positive  results are indicative of active infection with SARS-CoV-2.  Clinical  correlation with patient history and other  diagnostic information is  necessary to determine patient infection status.  Positive results do  not rule out bacterial infection or co-infection with other viruses. If result is PRESUMPTIVE POSTIVE SARS-CoV-2 nucleic acids MAY BE PRESENT.   A presumptive positive result was obtained on the submitted specimen  and confirmed on repeat testing.  While 2019 novel coronavirus  (SARS-CoV-2) nucleic acids may be present in the submitted sample  additional confirmatory testing may be necessary for epidemiological  and / or clinical management purposes  to differentiate between  SARS-CoV-2 and other Sarbecovirus currently known to infect humans.  If clinically indicated additional testing with an alternate test  methodology [phone]) is advised. The SARS-CoV-2 RNA is generally  detectable in upper and lower respiratory sp ecimens during the acute  phase of infection. The expected result is Negative. Fact Sheet for Patients:  StrictlyIdeas.no Fact Sheet for Healthcare Providers: BankingDealers.co.za This test is not yet approved or cleared by the [place] FDA and has been authorized for detection and/or diagnosis of SARS-CoV-2 by FDA under an Emergency Use Authorization (EUA).  This EUA will remain in effect (meaning this test can be used) for the duration of the COVID-19 declaration under Section 564(b)(1) of the Act, 21 U.S.C. section 360bbb-3(b)(1), unless the authorization is terminated or revoked sooner. Performed at [facility], [address] [place], [place] [address]   Lactic acid, plasma     Status: None   Collection Time: 05/11/19  9:05 PM  Result Value Ref Range   Lactic Acid, Venous 1.1 0.5 - 1.9 mmol/L    Comment: Performed at [facility], [place], [place] [address]  Troponin I (High Sensitivity)     Status: Abnormal   Collection Time: 05/11/19  9:05 PM  Result  Value Ref Range    Troponin I (High Sensitivity) 144 (HH) <18 ng/L    Comment: READ BACK AND VERIFIED WITH REBECCA LYNN AT 2203 ON 05/11/19 RWW (NOTE) Elevated high sensitivity troponin I (hsTnI) values and significant  changes across serial measurements may suggest ACS but many other  chronic and acute conditions are known to elevate hsTnI results.  Refer to the "Links" section for chest pain algorithms and additional  guidance. Performed at [facility], [address] [place], [place] [address]   Procalcitonin - Baseline     Status: None   Collection Time: 05/11/19  9:05 PM  Result Value Ref Range   Procalcitonin 5.32 ng/mL    Comment:        Interpretation: PCT > 2 ng/mL: Systemic infection (sepsis) is likely, unless other causes are known. (NOTE)       Sepsis PCT Algorithm           Lower Respiratory Tract                                      Infection PCT Algorithm    ----------------------------     ----------------------------         PCT < 0.25 ng/mL                PCT < 0.10 ng/mL         Strongly encourage             Strongly discourage   discontinuation of antibiotics    initiation of antibiotics    ----------------------------     -----------------------------       PCT 0.25 - 0.50 ng/mL            PCT 0.10 - 0.25 ng/mL               OR       >80% decrease in PCT            Discourage initiation of                                            antibiotics      Encourage discontinuation           of antibiotics    ----------------------------     -----------------------------         PCT >= 0.50 ng/mL              PCT 0.26 - 0.50 ng/mL               AND       <80% decrease in PCT              Encourage initiation of                                             antibiotics       Encourage continuation           of antibiotics    ----------------------------     -----------------------------        PCT >= 0.50 ng/mL  PCT > 0.50 ng/mL               AND          increase in PCT                  Strongly encourage                                      initiation of antibiotics    Strongly encourage escalation           of antibiotics                                     -----------------------------                                           PCT <= 0.25 ng/mL                                                 OR                                        > 80% decrease in PCT                                     Discontinue / Do not initiate                                             antibiotics Performed at [facility], [address] [place], [place] [address]   Blood gas, arterial ([facility] & [facility] ONLY)     Status: Abnormal   Collection Time: 05/11/19 10:02 PM  Result Value Ref Range   FIO2 0.40    Delivery systems BILEVEL POSITIVE AIRWAY PRESSURE    Inspiratory PAP 16    Expiratory PAP 8    pH, Arterial 7.38 7.350 - 7.450   pCO2 arterial 30 (L) 32.0 - 48.0 mmHg   pO2, Arterial 97 83.0 - 108.0 mmHg   Bicarbonate 17.7 (L) 20.0 - 28.0 mmol/L   Acid-base deficit 6.2 (H) 0.0 - 2.0 mmol/L   O2 Saturation 97.4 %   Patient temperature 37.0    Collection site LEFT BRACHIAL    Sample type ARTERIAL DRAW    Allens test (pass/fail) PASS PASS    Comment: Performed at [facility], [address] [place], [place] [address]  Glucose, capillary     Status: Abnormal   Collection Time: 05/11/19 11:54 PM  Result Value Ref Range   Glucose-Capillary 445 (H) 70 - 99 mg/dL  MRSA PCR Screening     Status: None   Collection Time: 05/12/19 12:20 AM   Specimen: Nasal Mucosa; Nasopharyngeal  Result Value Ref Range   MRSA by PCR NEGATIVE NEGATIVE    Comment:  The GeneXpert MRSA Assay (FDA approved for NASAL specimens only), is one component of a comprehensive MRSA colonization surveillance program. It is not intended to diagnose MRSA infection nor to guide or monitor treatment for MRSA infections. Performed at [facility], [address] [place], [place] [address]   Glucose, capillary     Status: Abnormal   Collection Time: 05/12/19 12:57 AM  Result Value Ref Range   Glucose-Capillary 496 (H) 70 - 99 mg/dL  Glucose, capillary     Status: Abnormal   Collection Time: 05/12/19  2:02 AM  Result Value Ref Range   Glucose-Capillary 479 (H) 70 - 99 mg/dL  Glucose, capillary     Status: Abnormal   Collection Time: 05/12/19  3:04 AM  Result Value Ref Range   Glucose-Capillary 422 (H) 70 - 99 mg/dL  Glucose, capillary     Status: Abnormal   Collection Time: 05/12/19  3:56 AM  Result Value Ref Range   Glucose-Capillary 401 (H) 70 - 99 mg/dL  Glucose, capillary     Status: Abnormal   Collection Time: 05/12/19  4:09 AM  Result Value Ref Range   Glucose-Capillary 373 (H) 70 - 99 mg/dL  Glucose, capillary     Status: Abnormal   Collection Time: 05/12/19  4:58 AM  Result Value Ref Range   Glucose-Capillary 342 (H) 70 - 99 mg/dL  Procalcitonin     Status: None   Collection Time: 05/12/19  5:13 AM  Result Value Ref Range   Procalcitonin 17.31 ng/mL    Comment:        Interpretation: PCT >= 10 ng/mL: Important systemic inflammatory response, almost exclusively due to severe bacterial sepsis or septic shock. (NOTE)       Sepsis PCT Algorithm           Lower Respiratory Tract                                      Infection PCT Algorithm    ----------------------------     ----------------------------         PCT < 0.25 ng/mL                PCT < 0.10 ng/mL         Strongly encourage             Strongly discourage   discontinuation of antibiotics    initiation of antibiotics    ----------------------------     -----------------------------       PCT 0.25 - 0.50 ng/mL            PCT 0.10 - 0.25 ng/mL               OR       >80% decrease in PCT            Discourage initiation of                                            antibiotics      Encourage discontinuation           of antibiotics     ----------------------------     -----------------------------         PCT >= 0.50 ng/mL  PCT 0.26 - 0.50 ng/mL                AND       <80% decrease in PCT             Encourage initiation of                                             antibiotics       Encourage continuation           of antibiotics    ----------------------------     -----------------------------        PCT >= 0.50 ng/mL                  PCT > 0.50 ng/mL               AND         increase in PCT                  Strongly encourage                                      initiation of antibiotics    Strongly encourage escalation           of antibiotics                                     -----------------------------                                           PCT <= 0.25 ng/mL                                                 OR                                        > 80% decrease in PCT                                     Discontinue / Do not initiate                                             antibiotics Performed at [facility], [address] [place], [place] [id number]   Basic metabolic panel     Status: Abnormal   Collection Time: 05/12/19  5:13 AM  Result Value Ref Range   Sodium 135 135 - 145 mmol/L   Potassium 4.2 3.5 - 5.1 mmol/L   Chloride 105 98 - 111 mmol/L   CO2 19 (L) 22 - 32 mmol/L   Glucose, Bld 354 (H) 70 - 99 mg/dL   BUN 50 (  H) 8 - 23 mg/dL   Creatinine, Ser 3.14 (H) 0.44 - 1.00 mg/dL   Calcium 8.6 (L) 8.9 - 10.3 mg/dL   GFR calc non Af Amer 13 (L) >60 mL/min   GFR calc Af Amer 15 (L) >60 mL/min   Anion gap 11 5 - 15    Comment: Performed at [facility], [address] [place], [place] [address]  Magnesium     Status: None   Collection Time: 05/12/19  5:13 AM  Result Value Ref Range   Magnesium 1.7 1.7 - 2.4 mg/dL    Comment: Performed at [facility], [address] [place], [place] [address]  Phosphorus     Status: None   Collection Time:  05/12/19  5:13 AM  Result Value Ref Range   Phosphorus 3.8 2.5 - 4.6 mg/dL    Comment: Performed at [facility], [address] [place], [place] [address]  Glucose, capillary     Status: Abnormal   Collection Time: 05/12/19  6:04 AM  Result Value Ref Range   Glucose-Capillary 273 (H) 70 - 99 mg/dL  Glucose, capillary     Status: Abnormal   Collection Time: 05/12/19  7:13 AM  Result Value Ref Range   Glucose-Capillary 214 (H) 70 - 99 mg/dL  Glucose, capillary     Status: Abnormal   Collection Time: 05/12/19  8:51 AM  Result Value Ref Range   Glucose-Capillary 121 (H) 70 - 99 mg/dL  Glucose, capillary     Status: Abnormal   Collection Time: 05/12/19 10:12 AM  Result Value Ref Range   Glucose-Capillary 108 (H) 70 - 99 mg/dL  Glucose, capillary     Status: Abnormal   Collection Time: 05/12/19 11:16 AM  Result Value Ref Range   Glucose-Capillary 107 (H) 70 - 99 mg/dL  Glucose, capillary     Status: Abnormal   Collection Time: 05/12/19 11:55 AM  Result Value Ref Range   Glucose-Capillary 111 (H) 70 - 99 mg/dL  Glucose, capillary     Status: Abnormal   Collection Time: 05/12/19  5:03 PM  Result Value Ref Range   Glucose-Capillary 232 (H) 70 - 99 mg/dL  Glucose, capillary     Status: Abnormal   Collection Time: 05/12/19  8:48 PM  Result Value Ref Range   Glucose-Capillary 168 (H) 70 - 99 mg/dL   Comment 1 Notify RN     Dg Chest Portable 1 View  Result Date: 05/11/2019 CLINICAL DATA:  Shortness of breath EXAM: PORTABLE CHEST 1 VIEW COMPARISON:  05/04/2019, 05/02/2019 FINDINGS: Cardiomegaly. Interval worsening of left greater than right interstitial and ground-glass disease. No large effusion. No pneumothorax. IMPRESSION: Cardiomegaly. Interval worsening of diffuse left greater than right interstitial and ground-glass opacity, which may reflect edema, diffuse pneumonia, or combination of the 2 Electronically Signed   By: Donavan Foil M.D.   On: 05/11/2019 19:21     Review of Systems  Unable to perform ROS: Acuity of condition   Blood pressure (!) 162/66, pulse 73, temperature 97.8 F (36.6 C), temperature source Oral, resp. rate 17, height 5\' 1"  (1.549 m), weight 79.4 kg, SpO2 96 %. Physical Exam  Nursing note and vitals reviewed. Constitutional: She appears well-developed and well-nourished. She appears listless.  HENT:  Head: Normocephalic and atraumatic.  Eyes: Pupils are equal, round, and reactive to light. Conjunctivae and EOM are normal.  Neck: Normal range of motion. Neck supple.  Cardiovascular: Normal rate and regular rhythm.  Murmur heard. Respiratory: She  has decreased breath sounds. She has rhonchi.  GI: Soft. Bowel sounds are normal.  Musculoskeletal: Normal range of motion.  Neurological: She has normal reflexes. She appears listless. Gait abnormal.  Skin: Skin is warm and dry.  Psychiatric: She has a normal mood and affect.    Assessment/Plan: SOD/DOE Bronchitis PNA AMS Weakness HTN DM Hypoxemia COPD OSA CRI . Plan Agree with admit to intensive care unit Continue respiratory critical care therapy inhalers supplemental oxygen Agree with CPAP BiPAP for respiratory failure Agree with broad-spectrum antibiotic therapy for possible bronchitis pneumonia Low-dose diuretic therapy for mild low-grade failure Continue inhalers for COPD bronchitis Follow-up renal function for renal sufficiency Continue diabetes management and control Recommend conservative cardiac input at this point I suspect this is mostly respiratory rather than cardiac    Soo Steelman D Shyler Holzman 05/12/2019, 11:17 PM

## 2019-05-12 NOTE — Consult Note (Signed)
Pharmacy Antibiotic Note  Holly Conner is a 78 y.o. female admitted on 05/11/2019 with healthcare acquired pneumonia. CXR significant for interval worsening of left greater than right interstitial and ground glass opacity consistent with edema, pneumonia, or a combination of the two. Pharmacy has been consulted for Cefepime dosing.   Today is day two of therapy. Vancomycin was discontinued after MRSA PCR screening resulted negative. PCT elevated today from 5.32 >> 17.31. Labs on admission notable for leukocytosis with left shift. Patient with low grade fever. 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     Plan: Continue cefepime 2g IV every 24 hours (renally adjusted)  Will continue to assess appropriateness of antibiotic regimen and renal function and adjust dosing as indicated.   Height: 5\' 1"  (154.9 cm) Weight: 175 lb 1.6 oz (79.4 kg) IBW/kg (Calculated) : 47.8  Temp (24hrs), Avg:100.2 F (37.9 C), Min:98.5 F (36.9 C), Max:101.4 F (38.6 C)  Recent Labs  Lab 05/11/19 1857 05/11/19 2105 05/12/19 0513  WBC 17.2*  --   --   CREATININE 2.89*  --  3.14*  LATICACIDVEN 2.2* 1.1  --     Estimated Creatinine Clearance: 13.6 mL/min (A) (by C-G formula based on SCr of 3.14 mg/dL (H)).    Allergies  Allergen Reactions  . Iodinated Diagnostic Agents Shortness Of Breath    Only with nuclear stress test per pt Only with nuclear stress test per pt   . Penicillins Rash    Rash at injection site. Pt states she can take Amoxicillin & Keflex   . Escitalopram   . Lithium   . Erythromycin Nausea Only and Nausea And Vomiting    Other reaction(s): Other (See Comments) dehydration     Antimicrobials this admission: 9/13 vancomycin >> 9/14 9/13 cefepime >>   Microbiology results: 9/13 Sputum: pending 9/13  BCx: NGTD 9/14 MRSA PCR: pnegative 9/13 SARS Coronavirus 2: negative   Thank you for allowing pharmacy to be a part of this patient's care.  [place] Resident 05/12/2019 2:11 PM

## 2019-05-12 NOTE — Progress Notes (Signed)
Pt transferred to RM # [id number] at this time. VSS prior to transfer. Report given to [facility], Therapist, sports.

## 2019-05-12 NOTE — Progress Notes (Signed)
PHARMACIST - PHYSICIAN COMMUNICATION  CONCERNING:  Degludec Tyler Aas) Insulin Order  DESCRIPTION:  Degludec Tyler Aas) is a Non-Formulary medication with an approved AUTO-SUBSTITUTION through [facility].   Per policy, glargine (Lantus) is substituted on a unit-to-unit basis.   ACTION: Patients order for Degludec 10units has been changed to Glargine 10units.   Pernell Dupre, PharmD, BCPS Clinical Pharmacist 05/12/2019 12:05 AM

## 2019-05-12 NOTE — TOC Initial Note (Signed)
Transition of Care [facility]) - Initial/Assessment Note    Patient Details  Name: Holly Conner MRN: [id number] Date of Birth: 03-Jul-1941  Transition of Care [facility] 2) CM/SW Contact:    Holly Conner, [place] Phone Number: 05/12/2019, 4:30 PM  Clinical Narrative:  [facility] team consulted for high readmission risk. CSW met with patient to discuss discharge plan. Patient reports that she lives with her friend who is her POA. Per patient friend's mom takes care of her and helps her. CSW noted in chart that patient arrived from [facility]. CSW questioned patient about [facility] and patient states that she has been there but was unable to tell CSW how long she has been there or why she was there. CSW unable to reach patient's friend at this time. CSW spoke with Holly Conner, admissions coordinator at [facility]. Holly Conner states that patient has been at [facility] for short term rehab and her friend Holly Conner is her POA and Media planner. Holly Conner reports that they have been working on Insurance underwriter for long term care at [facility]. Holly Conner also reports that Holly Conner's daughter is the one helping to care for patient not her mother. Holly Conner says that patient can return to facility when ready for discharge. CSW will continue to follow and reach out to patient's POA.                  Expected Discharge Plan: Skilled Nursing Facility Barriers to Discharge: Continued Medical Work up   Patient Goals and CMS Choice   CMS Medicare.gov Compare Post Acute Care list provided to:: Patient Choice offered to / list presented to : Patient  Expected Discharge Plan and Services Expected Discharge Plan: [place]       Living arrangements for the past 2 months: [place]                                      Prior Living Arrangements/Services Living arrangements for the past 2 months: [place] Lives with::  Friends Patient language and need for interpreter reviewed:: Yes Do you feel safe going back to the place where you live?: Yes      Need for Family Participation in Patient Care: Yes (Comment) Care giver support system in place?: Yes (comment)   Criminal Activity/Legal Involvement Pertinent to Current Situation/Hospitalization: No - Comment as needed  Activities of Daily Living   ADL Screening (condition at time of admission) Patient's cognitive ability adequate to safely complete daily activities?: No Is the patient deaf or have difficulty hearing?: No Does the patient have difficulty seeing, even when wearing glasses/contacts?: No Does the patient have difficulty concentrating, remembering, or making decisions?: Yes Patient able to express need for assistance with ADLs?: Yes Does the patient have difficulty dressing or bathing?: Yes Independently performs ADLs?: No Communication: Independent Dressing (OT): Needs assistance Is this a change from baseline?: Pre-admission baseline Grooming: Needs assistance Is this a change from baseline?: Pre-admission baseline Feeding: Independent Bathing: Needs assistance Is this a change from baseline?: Pre-admission baseline Toileting: Needs assistance Is this a change from baseline?: Pre-admission baseline In/Out Bed: Independent Walks in Home: Independent Does the patient have difficulty walking or climbing stairs?: Yes Weakness of Legs: Both Weakness of Arms/Hands: None  Permission Sought/Granted Permission sought to share information with : Case Manager, Customer service manager, Family Supports Permission granted to share information with : Yes, Verbal Permission  Granted              Emotional Assessment Appearance:: Appears younger than stated age   Affect (typically observed): Pleasant, Hopeful Orientation: : Oriented to Self, Oriented to Place Alcohol / Substance Use: Not Applicable Psych Involvement: No  (comment)  Admission diagnosis:  Acute on chronic systolic congestive heart failure (HCC) [I50.23] Acute on chronic respiratory failure with hypoxia (HCC) [J96.21] Multifocal pneumonia [J18.9] Patient Active Problem List   Diagnosis Date Noted  . Acute respiratory failure ([place]) 05/11/2019  . Acute respiratory failure with hypoxia ([place])   . NSTEMI (non-ST elevated myocardial infarction) ([place]) 05/02/2019  . Memory changes 04/05/2019  . Unresponsive 04/04/2019  . Unresponsive episode 04/03/2019  . Hypoglycemia 04/03/2019  . Elevated troponin 04/03/2019  . Weakness generalized 03/28/2019  . Hypertensive urgency 12/18/2018  . Stroke ([place]) 12/19/2015  . Type 2 diabetes mellitus ([place]) 12/19/2015  . HTN (hypertension) 12/19/2015  . GERD (gastroesophageal reflux disease) 12/19/2015  . HLD (hyperlipidemia) 12/19/2015  . CKD (chronic kidney disease), stage IV ([place]) 12/19/2015   PCP:  Dion Body, MD Pharmacy:   [facility] [address] [place] [address] Phone: [phone] Fax: [place] [address]), [place] - [place] [address]) [place] [address] Phone: [phone] Fax: [phone]     Social Determinants of Health ([place]) Interventions    Readmission Risk Interventions Readmission Risk Prevention Plan 05/12/2019  Transportation Screening Complete  Medication Review (RN Care Manager) Referral to Pharmacy  PCP or Specialist appointment within 3-5 days of discharge Complete  HRI or Home Care Consult Complete  SW Recovery Care/Counseling Consult Complete  Palliative Care Screening Not [place] Complete  Some recent data might be hidden

## 2019-05-13 LAB — BASIC METABOLIC PANEL
Anion gap: 10 (ref 5–15)
BUN: 64 mg/dL — ABNORMAL HIGH (ref 8–23)
CO2: 21 mmol/L — ABNORMAL LOW (ref 22–32)
Calcium: 8.8 mg/dL — ABNORMAL LOW (ref 8.9–10.3)
Chloride: 108 mmol/L (ref 98–111)
Creatinine, Ser: 2.98 mg/dL — ABNORMAL HIGH (ref 0.44–1.00)
GFR calc Af Amer: 16 mL/min — ABNORMAL LOW (ref >60)
GFR calc non Af Amer: 14 mL/min — ABNORMAL LOW (ref >60)
Glucose, Bld: 194 mg/dL — ABNORMAL HIGH (ref 70–99)
Potassium: 4.3 mmol/L (ref 3.5–5.1)
Sodium: 139 mmol/L (ref 135–145)

## 2019-05-13 LAB — CBC
HCT: 25 % — ABNORMAL LOW (ref 36.0–46.0)
Hemoglobin: 8.1 g/dL — ABNORMAL LOW (ref 12.0–15.0)
MCH: 29.9 pg (ref 26.0–34.0)
MCHC: 32.4 g/dL (ref 30.0–36.0)
MCV: 92.3 fL (ref 80.0–100.0)
Platelets: 320 10*3/uL (ref 150–400)
RBC: 2.71 MIL/uL — ABNORMAL LOW (ref 3.87–5.11)
RDW: 14.4 % (ref 11.5–15.5)
WBC: 19.1 10*3/uL — ABNORMAL HIGH (ref 4.0–10.5)
nRBC: 0 % (ref 0.0–0.2)

## 2019-05-13 LAB — GLUCOSE, CAPILLARY
Glucose-Capillary: 209 mg/dL — ABNORMAL HIGH (ref 70–99)
Glucose-Capillary: 284 mg/dL — ABNORMAL HIGH (ref 70–99)
Glucose-Capillary: 254 mg/dL — ABNORMAL HIGH (ref 70–99)
Glucose-Capillary: 170 mg/dL — ABNORMAL HIGH (ref 70–99)

## 2019-05-13 LAB — HIV ANTIBODY (ROUTINE TESTING W REFLEX): HIV Screen 4th Generation wRfx: NONREACTIVE

## 2019-05-13 LAB — PROCALCITONIN: Procalcitonin: 23.47 ng/mL

## 2019-05-13 LAB — STREP PNEUMONIAE URINARY ANTIGEN: Strep Pneumo Urinary Antigen: NEGATIVE

## 2019-05-13 MED ORDER — INSULIN DETEMIR 100 UNIT/ML ~~LOC~~ SOLN
5.0000 [IU] | Freq: Two times a day (BID) | SUBCUTANEOUS | Status: DC
  Administered 2019-05-13 – 2019-05-15 (×4): 5 [IU] via SUBCUTANEOUS
  Filled 2019-05-13 (×5): qty 0.05

## 2019-05-13 MED ORDER — SODIUM CHLORIDE 0.9 % IV SOLN
500.0000 mg | Freq: Every day | INTRAVENOUS | Status: DC
  Administered 2019-05-13 – 2019-05-15 (×3): 500 mg via INTRAVENOUS
  Filled 2019-05-13 (×5): qty 500

## 2019-05-13 NOTE — Progress Notes (Signed)
Patient has pulled out multiple IVs, 2 today. Another PIV has been started. PIV site taped and wrapped with gauze and bilateral mitts have been put on patient by floor nurse while VAST nurse at [facility]. MD aware. Garfield Cornea VAST

## 2019-05-13 NOTE — Progress Notes (Signed)
Family Meeting Note  Advance Directive:yes  Today a meeting took place with the Patient.  The following clinical team members were present during this meeting:MD  The following were discussed:Patient's diagnosis: Sepsis with healthcare associated pneumonia, chronic renal failure, diastolic congestive heart failure, Patient's progosis: Unable to determine and Goals for treatment: Full Code  She does not have any close family members and she would like her friend/roommate to be her power of attorney. She would like to be a full code for short period of time but if there is no chances of recovery then she would like to be extubated at that point.  She said that she had discussed that with her friend and power of attorney.  Additional follow-up to be provided: PMD  Time spent during discussion:20 minutes  Vaughan Basta, MD

## 2019-05-13 NOTE — Progress Notes (Signed)
[place] [facility] [place] NAME: Holly Conner    MR#:  [id number]  DATE OF BIRTH:  [birth year]  SUBJECTIVE:  CHIEF COMPLAINT:   Chief Complaint  Patient presents with  . Shortness of Breath   Came with SOB, required Bipap. Recent admisison to hospital for similar reasons. Was off BIPAP when I saw in ICU.  REVIEW OF SYSTEMS:  CONSTITUTIONAL: No fever, fatigue or weakness.  EYES: No blurred or double vision.  EARS, NOSE, AND THROAT: No tinnitus or ear pain.  RESPIRATORY: No cough,have shortness of breath, no wheezing or hemoptysis.  CARDIOVASCULAR: No chest pain, orthopnea, edema.  GASTROINTESTINAL: No nausea, vomiting, diarrhea or abdominal pain.  GENITOURINARY: No dysuria, hematuria.  ENDOCRINE: No polyuria, nocturia,  HEMATOLOGY: No anemia, easy bruising or bleeding SKIN: No rash or lesion. MUSCULOSKELETAL: No joint pain or arthritis.   NEUROLOGIC: No tingling, numbness, weakness.  PSYCHIATRY: No anxiety or depression.   ROS  DRUG ALLERGIES:   Allergies  Allergen Reactions  . Iodinated Diagnostic Agents Shortness Of Breath    Only with nuclear stress test per pt Only with nuclear stress test per pt   . Penicillins Rash    Rash at injection site. Pt states she can take Amoxicillin & Keflex   . Escitalopram   . Lithium   . Erythromycin Nausea Only and Nausea And Vomiting    Other reaction(s): Other (See Comments) dehydration     VITALS:  Blood pressure (!) 150/58, pulse 77, temperature 98.2 F (36.8 C), temperature source Oral, resp. rate 16, height 5\' 1"  (1.549 m), weight 79.4 kg, SpO2 92 %.  PHYSICAL EXAMINATION:  GENERAL:  78 y.o.-year-old patient lying in the bed with no acute distress.  EYES: Pupils equal, round, reactive to light and accommodation. No scleral icterus. Extraocular muscles intact.  HEENT: Head atraumatic, normocephalic. Oropharynx and nasopharynx clear.  NECK:  Supple, no jugular venous distention.  No thyroid enlargement, no tenderness.  LUNGS: Normal breath sounds bilaterally, no wheezing, rales,rhonchi or crepitation. No use of accessory muscles of respiration.  CARDIOVASCULAR: S1, S2 normal. No murmurs, rubs, or gallops.  ABDOMEN: Soft, nontender, nondistended. Bowel sounds present. No organomegaly or mass.  EXTREMITIES: No pedal edema, cyanosis, or clubbing.  NEUROLOGIC: Cranial nerves II through XII are intact. Muscle strength 4/5 in all extremities. Sensation intact. Gait not checked.  PSYCHIATRIC: The patient is alert and oriented x 3.  SKIN: No obvious rash, lesion, or ulcer.   Physical Exam LABORATORY PANEL:   CBC Recent Labs  Lab 05/11/19 1857  WBC 17.2*  HGB 8.8*  HCT 27.0*  PLT 351   ------------------------------------------------------------------------------------------------------------------  Chemistries  Recent Labs  Lab 05/11/19 1857 05/12/19 0513 05/13/19 0429  NA 135 135 139  K 4.9 4.2 4.3  CL 104 105 108  CO2 19* 19* 21*  GLUCOSE 296* 354* 194*  BUN 44* 50* 64*  CREATININE 2.89* 3.14* 2.98*  CALCIUM 8.7* 8.6* 8.8*  MG  --  1.7  --   AST 27  --   --   ALT 20  --   --   ALKPHOS 76  --   --   BILITOT 1.1  --   --    ------------------------------------------------------------------------------------------------------------------  Cardiac Enzymes No results for input(s): TROPONINI in the last 168 hours. ------------------------------------------------------------------------------------------------------------------  RADIOLOGY:  Dg Chest Portable 1 View  Result Date: 05/11/2019 CLINICAL DATA:  Shortness of breath EXAM: PORTABLE CHEST 1 VIEW COMPARISON:  05/04/2019, 05/02/2019 FINDINGS: Cardiomegaly. Interval worsening of  left greater than right interstitial and ground-glass disease. No large effusion. No pneumothorax. IMPRESSION: Cardiomegaly. Interval worsening of diffuse left greater than right interstitial and ground-glass opacity, which  may reflect edema, diffuse pneumonia, or combination of the 2 Electronically Signed   By: Donavan Foil M.D.   On: 05/11/2019 19:21    ASSESSMENT AND PLAN:   Active Problems:   Acute respiratory failure [facility])  Patient is an 78 year old female with history of diabetes mellitus,chronic kidney disease stage IV and CAD who presented to the emergency room with shortness of breath, cough fever and chills  1.Acute on chronic hypoxic respiratory failure -Requiring BiPAP. admitted to ICU. - Improved now, on nasal canula oxygen.  2.  Sepsis: Present on admission -Due to pneumonia  3.  Acute on chronic diastolic CHF -Cardiology consultation awaited. -Serial troponins slightly high, likely stress induced. -Gentle diuresis as able  4.  HCAPneumonia -IV antibiotic broad spectrum for HCAP - MRSA pCR negative, stopped vanc.  5.Diabetes mellitus type 2 - sliding scale insulin.  ICU hyperglycemia protocol will be followed  6. Chronic kidney disease stage IV Monitor renal function. To consider nephrology consult if worsening of renal function.  7. Anemia of chronic disease Hemoglobin 8.8     All the records are reviewed and case discussed with Care Management/Social Workerr. Management plans discussed with the patient, family and they are in agreement.  CODE STATUS: Full.  TOTAL TIME TAKING CARE OF THIS PATIENT: 35 minutes.     POSSIBLE D/C IN 2-3 DAYS, DEPENDING ON CLINICAL CONDITION.   Vaughan Basta M.D on 05/13/2019   Between 7am to 6pm - Pager - [phone]  After 6pm go to [email or web address] - password [email or web address] [facility] [place] [facility]  Office  [phone]  CC: Primary care physician; Dion Body, MD  Note: This dictation was prepared with Dragon dictation along with smaller phrase technology. Any transcriptional errors that result from this process are unintentional.

## 2019-05-13 NOTE — Progress Notes (Signed)
[place] at [place] NAME: Holly Conner    MR#:  [id number]  DATE OF BIRTH:  [birth year]  SUBJECTIVE:  CHIEF COMPLAINT:   Chief Complaint  Patient presents with  . Shortness of Breath   Came with SOB, required Bipap. Recent admisison to hospital for similar reasons. Was off BIPAP  in ICU.  Improved and so transferred to floor. She had some episodes of confusion and pulling out her lines and wires with nursing staff today  REVIEW OF SYSTEMS:  CONSTITUTIONAL: No fever, fatigue or weakness.  EYES: No blurred or double vision.  EARS, NOSE, AND THROAT: No tinnitus or ear pain.  RESPIRATORY: No cough,have shortness of breath, no wheezing or hemoptysis.  CARDIOVASCULAR: No chest pain, orthopnea, edema.  GASTROINTESTINAL: No nausea, vomiting, diarrhea or abdominal pain.  GENITOURINARY: No dysuria, hematuria.  ENDOCRINE: No polyuria, nocturia,  HEMATOLOGY: No anemia, easy bruising or bleeding SKIN: No rash or lesion. MUSCULOSKELETAL: No joint pain or arthritis.   NEUROLOGIC: No tingling, numbness, weakness.  PSYCHIATRY: No anxiety or depression.   ROS  DRUG ALLERGIES:   Allergies  Allergen Reactions  . Iodinated Diagnostic Agents Shortness Of Breath    Only with nuclear stress test per pt Only with nuclear stress test per pt   . Penicillins Rash    Rash at injection site. Pt states she can take Amoxicillin & Keflex   . Escitalopram   . Lithium   . Erythromycin Nausea Only and Nausea And Vomiting    Other reaction(s): Other (See Comments) dehydration     VITALS:  Blood pressure (!) 139/58, pulse 70, temperature 98.2 F (36.8 C), temperature source Oral, resp. rate 15, height 5\' 1"  (1.549 m), weight 79.4 kg, SpO2 94 %.  PHYSICAL EXAMINATION:  GENERAL:  78 y.o.-year-old patient lying in the bed with no acute distress.  EYES: Pupils equal, round, reactive to light and accommodation. No scleral icterus. Extraocular muscles  intact.  HEENT: Head atraumatic, normocephalic. Oropharynx and nasopharynx clear.  NECK:  Supple, no jugular venous distention. No thyroid enlargement, no tenderness.  LUNGS: Normal breath sounds bilaterally, no wheezing, rales,rhonchi or crepitation. No use of accessory muscles of respiration.  CARDIOVASCULAR: S1, S2 normal. No murmurs, rubs, or gallops.  ABDOMEN: Soft, nontender, nondistended. Bowel sounds present. No organomegaly or mass.  EXTREMITIES: No pedal edema, cyanosis, or clubbing.  NEUROLOGIC: Cranial nerves II through XII are intact. Muscle strength 4/5 in all extremities. Sensation intact. Gait not checked.  PSYCHIATRIC: The patient is alert and oriented x 2-3.  SKIN: No obvious rash, lesion, or ulcer.   Physical Exam LABORATORY PANEL:   CBC Recent Labs  Lab 05/13/19 0429  WBC 19.1*  HGB 8.1*  HCT 25.0*  PLT 320   ------------------------------------------------------------------------------------------------------------------  Chemistries  Recent Labs  Lab 05/11/19 1857 05/12/19 0513 05/13/19 0429  NA 135 135 139  K 4.9 4.2 4.3  CL 104 105 108  CO2 19* 19* 21*  GLUCOSE 296* 354* 194*  BUN 44* 50* 64*  CREATININE 2.89* 3.14* 2.98*  CALCIUM 8.7* 8.6* 8.8*  MG  --  1.7  --   AST 27  --   --   ALT 20  --   --   ALKPHOS 76  --   --   BILITOT 1.1  --   --    ------------------------------------------------------------------------------------------------------------------  Cardiac Enzymes No results for input(s): TROPONINI in the last 168 hours. ------------------------------------------------------------------------------------------------------------------  RADIOLOGY:  Dg Chest Portable 1 View  Result  Date: 05/11/2019 CLINICAL DATA:  Shortness of breath EXAM: PORTABLE CHEST 1 VIEW COMPARISON:  05/04/2019, 05/02/2019 FINDINGS: Cardiomegaly. Interval worsening of left greater than right interstitial and ground-glass disease. No large effusion. No  pneumothorax. IMPRESSION: Cardiomegaly. Interval worsening of diffuse left greater than right interstitial and ground-glass opacity, which may reflect edema, diffuse pneumonia, or combination of the 2 Electronically Signed   By: Donavan Foil M.D.   On: 05/11/2019 19:21    ASSESSMENT AND PLAN:   Active Problems:   Acute respiratory failure [employer])  Patient is an 78 year old female with history of diabetes mellitus,chronic kidney disease stage IV and CAD who presented to the emergency room with shortness of breath, cough fever and chills  1.Acute on chronic hypoxic respiratory failure -Required BiPAP. admitted to ICU. - Improved now, on nasal canula oxygen.  Transfer to floor. -Try to taper to room air.  2.  Sepsis: Present on admission -Due to pneumonia, improved now.  3.  Acute on chronic diastolic CHF -Cardiology consultation appreciated, no change in plan. -Serial troponins slightly high, likely stress induced. -Gentle diuresis as able  4.  HCAPneumonia -IV antibiotic broad spectrum for HCAP - MRSA pCR negative, stopped vanc. -Spoke to pulmonary- Dr.kasa-as patient's procalcitonin is rising, added azithromycin for atypical coverage.  5.Diabetes mellitus type 2 - sliding scale insulin.    Long-acting insulin added as per diabetes nurse.  6. Chronic kidney disease stage IV Monitor renal function. To consider nephrology consult if worsening of renal function. -Stable for now.  7. Anemia of chronic disease Hemoglobin 8.1     All the records are reviewed and case discussed with Care Management/Social Workerr. Management plans discussed with the patient, family and they are in agreement.  CODE STATUS: Full.  TOTAL TIME TAKING CARE OF THIS PATIENT: 35 minutes.    POSSIBLE D/C IN 2-3 DAYS, DEPENDING ON CLINICAL CONDITION.   Vaughan Basta M.D on 05/13/2019   Between 7am to 6pm - Pager - [phone]  After 6pm go to [email or web address] - password  [email or web address] [facility]  Office  [phone]  CC: Primary care physician; Dion Body, MD  Note: This dictation was prepared with Dragon dictation along with smaller phrase technology. Any transcriptional errors that result from this process are unintentional.

## 2019-05-13 NOTE — Progress Notes (Signed)
PT Cancellation Note  Patient Details Name: Holly Conner MRN: [id number] DOB: 06/07/1941   Cancelled Treatment:    Reason Eval/Treat Not Completed: Other (comment)(Chart reviewed; PT entered room, pt in sidelying lights off. Pt requesting PT to return so she can get some rest. Despite education, pt still declining. PT will follow up as able.)  Dixie Dials, SPT  Victormanuel Mclure 05/13/2019, 3:50 PM

## 2019-05-13 NOTE — Progress Notes (Signed)
Inpatient Diabetes Program Recommendations  AACE/ADA: New Consensus Statement on Inpatient Glycemic Control (2015)  Target Ranges:  Prepandial:   less than 140 mg/dL      Peak postprandial:   less than 180 mg/dL (1-2 hours)      Critically ill patients:  140 - 180 mg/dL   Lab Results  Component Value Date   GLUCAP 209 (H) 05/13/2019   HGBA1C 6.0 (H) 05/03/2019    Review of Glycemic Control Results for Holly Conner, Holly Conner (MRN [id number]) as of 05/13/2019 10:35  Ref. Range 05/12/2019 11:55 05/12/2019 17:03 05/12/2019 20:48 05/13/2019 07:55  Glucose-Capillary Latest Ref Range: 70 - 99 mg/dL 111 (H) 232 (H) 168 (H) 209 (H)   Diabetes history: DM 2 Outpatient Diabetes medications:  Novolog 0-12 units tid with meals, Tresiba 10 mg daily Current orders for Inpatient glycemic control:  Novolog sensitive tid with meals and HS Levemir 7 units daily Inpatient Diabetes Program Recommendations:   Note fasting CBG>200 mg/dL Consider changing Levemir to 5 units bid to ensure that it is lasting 24 hours.    Thanks  Adah Perl, RN, BC-ADM Inpatient Diabetes Coordinator Pager [phone] (8a-5p)

## 2019-05-14 DIAGNOSIS — Z515 Encounter for palliative care: Secondary | ICD-10-CM

## 2019-05-14 DIAGNOSIS — F01518 Vascular dementia, unspecified severity, with other behavioral disturbance: Secondary | ICD-10-CM

## 2019-05-14 DIAGNOSIS — F0151 Vascular dementia with behavioral disturbance: Secondary | ICD-10-CM

## 2019-05-14 DIAGNOSIS — I5023 Acute on chronic systolic (congestive) heart failure: Secondary | ICD-10-CM

## 2019-05-14 DIAGNOSIS — Z7189 Other specified counseling: Secondary | ICD-10-CM

## 2019-05-14 DIAGNOSIS — Z8673 Personal history of transient ischemic attack (TIA), and cerebral infarction without residual deficits: Secondary | ICD-10-CM

## 2019-05-14 LAB — BASIC METABOLIC PANEL
Anion gap: 10 (ref 5–15)
BUN: 64 mg/dL — ABNORMAL HIGH (ref 8–23)
CO2: 22 mmol/L (ref 22–32)
Calcium: 8.5 mg/dL — ABNORMAL LOW (ref 8.9–10.3)
Chloride: 105 mmol/L (ref 98–111)
Creatinine, Ser: 3.03 mg/dL — ABNORMAL HIGH (ref 0.44–1.00)
GFR calc Af Amer: 16 mL/min — ABNORMAL LOW (ref >60)
GFR calc non Af Amer: 14 mL/min — ABNORMAL LOW (ref >60)
Glucose, Bld: 205 mg/dL — ABNORMAL HIGH (ref 70–99)
Potassium: 3.9 mmol/L (ref 3.5–5.1)
Sodium: 137 mmol/L (ref 135–145)

## 2019-05-14 LAB — CBC
HCT: 24.2 % — ABNORMAL LOW (ref 36.0–46.0)
Hemoglobin: 7.9 g/dL — ABNORMAL LOW (ref 12.0–15.0)
MCH: 29.4 pg (ref 26.0–34.0)
MCHC: 32.6 g/dL (ref 30.0–36.0)
MCV: 90 fL (ref 80.0–100.0)
Platelets: 285 10*3/uL (ref 150–400)
RBC: 2.69 MIL/uL — ABNORMAL LOW (ref 3.87–5.11)
RDW: 14.1 % (ref 11.5–15.5)
WBC: 10.7 10*3/uL — ABNORMAL HIGH (ref 4.0–10.5)
nRBC: 0 % (ref 0.0–0.2)

## 2019-05-14 LAB — VITAMIN B12: Vitamin B-12: 659 pg/mL (ref 180–914)

## 2019-05-14 LAB — GLUCOSE, CAPILLARY
Glucose-Capillary: 194 mg/dL — ABNORMAL HIGH (ref 70–99)
Glucose-Capillary: 308 mg/dL — ABNORMAL HIGH (ref 70–99)
Glucose-Capillary: 257 mg/dL — ABNORMAL HIGH (ref 70–99)
Glucose-Capillary: 253 mg/dL — ABNORMAL HIGH (ref 70–99)

## 2019-05-14 LAB — PROCALCITONIN: Procalcitonin: 15.19 ng/mL

## 2019-05-14 LAB — RETICULOCYTES
Immature Retic Fract: 12.4 % (ref 2.3–15.9)
RBC.: 2.63 MIL/uL — ABNORMAL LOW (ref 3.87–5.11)
Retic Count, Absolute: 61.5 10*3/uL (ref 19.0–186.0)
Retic Ct Pct: 2.3 % (ref 0.4–3.1)

## 2019-05-14 LAB — IRON AND TIBC
Iron: 17 ug/dL — ABNORMAL LOW (ref 28–170)
Saturation Ratios: 10 % — ABNORMAL LOW (ref 10.4–31.8)
TIBC: 179 ug/dL — ABNORMAL LOW (ref 250–450)
UIBC: 163 ug/dL

## 2019-05-14 LAB — FOLATE: Folate: 8.6 ng/mL (ref >5.9)

## 2019-05-14 LAB — LEGIONELLA PNEUMOPHILA SEROGP 1 UR AG: L. pneumophila Serogp 1 Ur Ag: NEGATIVE

## 2019-05-14 LAB — FERRITIN: Ferritin: 160 ng/mL (ref 11–307)

## 2019-05-14 MED ORDER — SENNOSIDES-DOCUSATE SODIUM 8.6-50 MG PO TABS
2.0000 | ORAL_TABLET | Freq: Two times a day (BID) | ORAL | Status: DC
  Administered 2019-05-14 – 2019-05-16 (×4): 2 via ORAL
  Filled 2019-05-14 (×4): qty 2

## 2019-05-14 MED ORDER — INSULIN ASPART 100 UNIT/ML ~~LOC~~ SOLN
4.0000 [IU] | Freq: Three times a day (TID) | SUBCUTANEOUS | Status: DC
  Administered 2019-05-14 – 2019-05-15 (×3): 4 [IU] via SUBCUTANEOUS
  Filled 2019-05-14 (×3): qty 1

## 2019-05-14 MED ORDER — OLANZAPINE 5 MG PO TBDP
2.5000 mg | ORAL_TABLET | Freq: Every day | ORAL | Status: DC
  Administered 2019-05-14 – 2019-05-15 (×2): 2.5 mg via ORAL
  Filled 2019-05-14 (×3): qty 0.5

## 2019-05-14 MED ORDER — BISACODYL 5 MG PO TBEC
10.0000 mg | DELAYED_RELEASE_TABLET | Freq: Every day | ORAL | Status: DC | PRN
  Administered 2019-05-15: 09:00:00 10 mg via ORAL
  Filled 2019-05-14: qty 2

## 2019-05-14 MED ORDER — OLANZAPINE 5 MG PO TBDP
2.5000 mg | ORAL_TABLET | Freq: Every day | ORAL | Status: DC
  Filled 2019-05-14: qty 0.5

## 2019-05-14 MED ORDER — BISACODYL 10 MG RE SUPP
10.0000 mg | Freq: Every day | RECTAL | Status: DC | PRN
  Administered 2019-05-15: 13:00:00 10 mg via RECTAL
  Filled 2019-05-14: qty 1

## 2019-05-14 MED ORDER — ZIPRASIDONE MESYLATE 20 MG IM SOLR
10.0000 mg | Freq: Four times a day (QID) | INTRAMUSCULAR | Status: DC | PRN
  Administered 2019-05-15 (×2): 10 mg via INTRAMUSCULAR
  Filled 2019-05-14 (×5): qty 20

## 2019-05-14 MED ORDER — OLANZAPINE 5 MG PO TBDP: 2.5000 mg | ORAL_TABLET | Freq: Every day | ORAL | Status: DC

## 2019-05-14 NOTE — Progress Notes (Signed)
Pt  Given meds this morning , noted to be coughing with Vitamins, this writer crushed and she tolerated better with applesauce. May need ST consult. Ambulated around unit with PT without difficulty . IV abx being administered , IV site wrapped with occlusive and intact and flushed without difficulty . Will continue to monitor

## 2019-05-14 NOTE — Progress Notes (Signed)
Inpatient Diabetes Program Recommendations  AACE/ADA: New Consensus Statement on Inpatient Glycemic Control (2015)  Target Ranges:  Prepandial:   less than 140 mg/dL      Peak postprandial:   less than 180 mg/dL (1-2 hours)      Critically ill patients:  140 - 180 mg/dL   Lab Results  Component Value Date   GLUCAP 308 (H) 05/14/2019   HGBA1C 6.0 (H) 05/03/2019    Review of Glycemic Control Results for Holly Conner, Holly Conner (MRN [id number]) as of 05/14/2019 13:44  Ref. Range 05/13/2019 07:55 05/13/2019 11:44 05/13/2019 16:52 05/13/2019 21:06 05/14/2019 08:49 05/14/2019 12:30  Glucose-Capillary Latest Ref Range: 70 - 99 mg/dL 209 (H) 284 (H) 254 (H) 170 (H) 194 (H) 308 (H)   Diabetes history: DM 2 Outpatient Diabetes medications: Novolog 2-12 units tid with meals, Tresiba 10 units daily  Current orders for Inpatient glycemic control:  Levemir 5 units bid Novolog 0-9 units tid + hs  Inpatient Diabetes Program Recommendations:    Glucose trends increased into 300's after meals.  Consider Novolog 4 units tid meal coverage if patient consumes at least 50% of meals.  Thanks,  Tama Headings RN, MSN, BC-ADM Inpatient Diabetes Coordinator Team Pager [phone] (8a-5p)

## 2019-05-14 NOTE — Progress Notes (Signed)
[facility] [place] NAME: Holly Conner    MR#:  [id number]  DATE OF BIRTH:  Dec 15, 1940  SUBJECTIVE:  CHIEF COMPLAINT:   Chief Complaint  Patient presents with  . Shortness of Breath   Came with SOB, required Bipap. Recent admisison to hospital for similar reasons. Was off BIPAP  in ICU.  Improved and so transferred to floor. She had some episodes of confusion and pulling out her lines and wires with nursing staff last evening, sitter at bedside.  She seems comfortable and alert  REVIEW OF SYSTEMS:  CONSTITUTIONAL: No fever, fatigue or weakness.  EYES: No blurred or double vision.  EARS, NOSE, AND THROAT: No tinnitus or ear pain.  RESPIRATORY: No cough,have shortness of breath, no wheezing or hemoptysis.  CARDIOVASCULAR: No chest pain, orthopnea, edema.  GASTROINTESTINAL: No nausea, vomiting, diarrhea or abdominal pain.  GENITOURINARY: No dysuria, hematuria.  ENDOCRINE: No polyuria, nocturia,  HEMATOLOGY: No anemia, easy bruising or bleeding SKIN: No rash or lesion. MUSCULOSKELETAL: No joint pain or arthritis.   NEUROLOGIC: No tingling, numbness, weakness.  PSYCHIATRY: No anxiety or depression.   ROS  DRUG ALLERGIES:   Allergies  Allergen Reactions  . Iodinated Diagnostic Agents Shortness Of Breath    Only with nuclear stress test per pt Only with nuclear stress test per pt   . Penicillins Rash    Rash at injection site. Pt states she can take Amoxicillin & Keflex   . Escitalopram   . Lithium   . Erythromycin Nausea Only and Nausea And Vomiting    Other reaction(s): Other (See Comments) dehydration     VITALS:  Blood pressure (!) 151/49, pulse 69, temperature 97.8 F (36.6 C), temperature source Oral, resp. rate 18, height 5\' 1"  (1.549 m), weight 79.4 kg, SpO2 95 %. PHYSICAL EXAMINATION:  GENERAL:  78 y.o.-year-old patient lying in the bed with no acute distress.  EYES: Pupils equal, round, reactive to light and  accommodation. No scleral icterus. Extraocular muscles intact.  HEENT: Head atraumatic, normocephalic. Oropharynx and nasopharynx clear.  NECK:  Supple, no jugular venous distention. No thyroid enlargement, no tenderness.  LUNGS: Normal breath sounds bilaterally, no wheezing, rales,rhonchi or crepitation. No use of accessory muscles of respiration.  CARDIOVASCULAR: S1, S2 normal. No murmurs, rubs, or gallops.  ABDOMEN: Soft, nontender, nondistended. Bowel sounds present. No organomegaly or mass.  EXTREMITIES: No pedal edema, cyanosis, or clubbing.  NEUROLOGIC: Cranial nerves II through XII are intact. Muscle strength 4/5 in all extremities. Sensation intact. Gait not checked.  PSYCHIATRIC: The patient is alert and oriented x 2-3.  SKIN: No obvious rash, lesion, or ulcer.   Physical Exam LABORATORY PANEL:   CBC Recent Labs  Lab 05/14/19 0350  WBC 10.7*  HGB 7.9*  HCT 24.2*  PLT 285   ------------------------------------------------------------------------------------------------------------------  Chemistries  Recent Labs  Lab 05/11/19 1857 05/12/19 0513  05/14/19 0350  NA 135 135   < > 137  K 4.9 4.2   < > 3.9  CL 104 105   < > 105  CO2 19* 19*   < > 22  GLUCOSE 296* 354*   < > 205*  BUN 44* 50*   < > 64*  CREATININE 2.89* 3.14*   < > 3.03*  CALCIUM 8.7* 8.6*   < > 8.5*  MG  --  1.7  --   --   AST 27  --   --   --   ALT 20  --   --   --  ALKPHOS 76  --   --   --   BILITOT 1.1  --   --   --    < > = values in this interval not displayed.   ------------------------------------------------------------------------------------------------------------------  Cardiac Enzymes No results for input(s): TROPONINI in the last 168 hours. ------------------------------------------------------------------------------------------------------------------  RADIOLOGY:  No results found.  ASSESSMENT AND PLAN:   Active Problems:   Acute respiratory failure [facility])  Patient is an  78 year old female with history of diabetes mellitus,chronic kidney disease stage IV and CAD who presented to the emergency room with shortness of breath, cough fever and chills  1.Acute on chronic hypoxic respiratory failure: Initially required BiPAP and stepdown care but now -On room air.  2.  Sepsis: Present on admission -Due to pneumonia, improved now.  3.  Acute on chronic diastolic CHF -Cardiology consultation appreciated, no change in plan. -Serial troponins slightly high, likely stress induced. -Gentle diuresis as able  4.  HCAPneumonia -IV antibiotic broad spectrum for HCAP - MRSA pCR negative, stopped vanc. - azithromycin for atypical coverage.  5.Diabetes mellitus type 2 - sliding scale insulin.    Long-acting insulin added as per diabetes nurse.  6. Chronic kidney disease stage IV Monitor renal function. To consider nephrology consult if worsening of renal function. -Stable for now.  7. Anemia of chronic disease Hemoglobin 7.9 -We will check anemia panel  8.  Agitation: Likely due to acute delirium   Physical therapy recommends skilled nursing facility.  Social worker is on board coordinating   All the records are reviewed and case discussed with Care Management/Social Workerr. Management plans discussed with the patient, nursing and they are in agreement.  CODE STATUS: Full.  TOTAL TIME TAKING CARE OF THIS PATIENT: 35 minutes.    POSSIBLE D/C IN 1-2 DAYS, DEPENDING ON CLINICAL CONDITION.   Max Sane M.D on 05/14/2019   Between 7am to 6pm - Pager - [phone]  After 6pm go to [email or web address] - password [email or web address] [facility]  Office  [phone]  CC: Primary care physician; Dion Body, MD  Note: This dictation was prepared with Dragon dictation along with smaller phrase technology. Any transcriptional errors that result from this process are unintentional.

## 2019-05-14 NOTE — Progress Notes (Signed)
Pt daughter called expressing concerns about pt care and wanted update .Marland KitchenMarland KitchenThereasa Distance and shared with daughter that pt is not agitated at this time. BS have been running a little high, however given SSI . Appetite adequate . PO fluids encouraged and accepted. IV abts infusing without difficulty . Will continue to monitor

## 2019-05-14 NOTE — Progress Notes (Signed)
RN reports pt has removed multiple PIV's. Recommended mittens/restraints be considered as appropriate. RN reports pt removes with teeth. New site covered with sleeve and wrapped with coban and tape. Attempted discussion with pt about protecting PIV; pt tearful and changes topic. Denies any needs at this time. Denies pain.

## 2019-05-14 NOTE — Consult Note (Signed)
[address] [place], [place] [address] Phone [phone]. Fax [phone]  Date: 05/14/2019                  Patient Name:  Holly Conner  MRN: [id number]  DOB: 1941-07-13  Age / Sex: 78 y.o., female         PCP: Dion Body, MD                 Service Requesting Consult: IM/ Max Sane, MD                 Reason for Consult: ARF            History of Present Illness: Patient is a 78 y.o. female with medical problems of chronic kidney disease stage IV, depression, diabetes type 2, hypertension, spinal stenosis, stroke 2017, myopathy 2014, osteoarthritis.  History of right knee replacement , who was admitted to [facility] on 05/11/2019 .   Patient is routinely followed by [facility] nephrology for chronic kidney disease and hypertension.  She last saw Dr. Radene Knee in February 2020.  She is followed by Dr. Gabriel Carina for diabetes.     Chronic kidney disease is thought to be secondary to longstanding hypertension and diabetes.  Her baseline was close to 2.5/ GFR 18 in August.  She is admitted via EMS from [place] care for shortness of breath.  Patient was found to be hypoxic with oxygen saturation of 63% on room air.  Improved with oxygen supplementation.  She initially required BiPAP.  She was admitted for intermittent confusion History of recent stroke, tiredness, fatigue and chills  Patient currently has a sitter in room for orientation  She has pulled out her iv 8 times already Also tries to get up from bed and is a fall risk   Medications: Outpatient medications: Medications Prior to Admission  Medication Sig Dispense Refill Last Dose  . Ascorbic Acid (VITAMIN C) 1000 MG tablet Take 1,000 mg by mouth daily.   05/11/2019 at [place]  . aspirin EC 81 MG tablet Take 1 tablet (81 mg total) by mouth daily. 150 tablet 2 05/11/2019 at 0745  . atorvastatin (LIPITOR) 80 MG tablet Take 80 mg by mouth daily.    05/10/2019 at 2000  . insulin aspart (NOVOLOG FLEXPEN) 100 UNIT/ML  FlexPen Inject 0-12 Units into the skin See admin instructions. Inject under the skin three times daily with meals according to sliding scale:   200 or below: 0u 201-250: 2u 251-300: 4u 301-350: 6u 351-400: 8u 401-450: 10u 450 or higher: 12u and contact physician   05/11/2019 at 0800  . Insulin Degludec (TRESIBA FLEXTOUCH) 200 UNIT/ML SOPN Inject 10 Units into the skin daily. 3 mL 0 05/11/2019 at 0745  . losartan (COZAAR) 50 MG tablet Take 50 mg by mouth daily.   05/11/2019 at [place]  . metoprolol tartrate (LOPRESSOR) 25 MG tablet Take 1 tablet (25 mg total) by mouth 2 (two) times daily. 600 tablet 0 05/11/2019 at 0745  . Multiple Vitamin (MULTIVITAMIN WITH MINERALS) TABS tablet Take 1 tablet by mouth daily. 30 tablet 0 05/11/2019 at 0745  . pyridOXINE (VITAMIN B-6) 100 MG tablet Take 100 mg by mouth daily.   05/11/2019 at [place]  . solifenacin (VESICARE) 10 MG tablet Take 1 tablet (10 mg total) by mouth daily. 90 tablet 3 05/11/2019 at 0745  . tamsulosin (FLOMAX) 0.4 MG CAPS capsule Take 1 capsule (0.4 mg total) by mouth at bedtime. 30 capsule 0 05/10/2019 at 2000  . vitamin  B-12 (CYANOCOBALAMIN) 100 MCG tablet Take 100 mcg by mouth daily.    05/11/2019 at [place]  . loratadine (CLARITIN) 10 MG tablet Take 10 mg by mouth daily as needed for allergies.   prn at prn    Current medications: Current Facility-Administered Medications  Medication Dose Route Frequency Provider Last Rate Last Dose  . aspirin EC tablet 81 mg  81 mg Oral Daily Max Sane, MD   81 mg at 05/14/19 0932  . atorvastatin (LIPITOR) tablet 80 mg  80 mg Oral Daily Manuella Ghazi, Vipul, MD   80 mg at 05/14/19 0930  . azithromycin (ZITHROMAX) 500 mg in sodium chloride 0.9 % 250 mL IVPB  500 mg Intravenous Daily Flora Lipps, MD 250 mL/hr at 05/14/19 1111 500 mg at 05/14/19 1111  . ceFEPIme (MAXIPIME) 2 g in sodium chloride 0.9 % 100 mL IVPB  2 g Intravenous Q24H Pernell Dupre, RPH   Stopped at 05/13/19 2045  . Chlorhexidine Gluconate Cloth 2 %  PADS 6 each  6 each Topical Daily Tukov-Yual, Magdalene S, NP   6 each at 05/14/19 0942  . darifenacin (ENABLEX) 24 hr tablet 7.5 mg  7.5 mg Oral Daily Max Sane, MD   7.5 mg at 05/14/19 0943  . dextrose 5 %-0.45 % sodium chloride infusion   Intravenous Continuous Tukov-Yual, Magdalene S, NP      . dextrose 50 % solution 25 mL  25 mL Intravenous PRN Tukov-Yual, Magdalene S, NP      . furosemide (LASIX) injection 20 mg  20 mg Intravenous BID Max Sane, MD   20 mg at 05/14/19 0925  . heparin injection 5,000 Units  5,000 Units Subcutaneous Q8H Tukov-Yual, Magdalene S, NP   5,000 Units at 05/14/19 0523  . insulin aspart (novoLOG) injection 0-5 Units  0-5 Units Subcutaneous QHS Kasa, Kurian, MD      . insulin aspart (novoLOG) injection 0-9 Units  0-9 Units Subcutaneous TID WC Flora Lipps, MD   2 Units at 05/14/19 0927  . insulin detemir (LEVEMIR) injection 5 Units  5 Units Subcutaneous BID Vaughan Basta, MD   5 Units at 05/14/19 [phone]  . loratadine (CLARITIN) tablet 10 mg  10 mg Oral Daily PRN Max Sane, MD      . metoprolol tartrate (LOPRESSOR) tablet 25 mg  25 mg Oral BID Max Sane, MD   25 mg at 05/14/19 0932  . multivitamin with minerals tablet 1 tablet  1 tablet Oral Daily Max Sane, MD   1 tablet at 05/14/19 0934  . pyridOXINE (VITAMIN B-6) tablet 100 mg  100 mg Oral Daily Max Sane, MD   100 mg at 05/14/19 0934  . tamsulosin (FLOMAX) capsule 0.4 mg  0.4 mg Oral QHS Max Sane, MD   0.4 mg at 05/13/19 2126  . vitamin B-12 (CYANOCOBALAMIN) tablet 100 mcg  100 mcg Oral Daily Max Sane, MD   100 mcg at 05/14/19 0933  . vitamin C (ASCORBIC ACID) tablet 1,000 mg  1,000 mg Oral Daily Max Sane, MD   1,000 mg at 05/14/19 [id number]      Allergies: Allergies  Allergen Reactions  . Iodinated Diagnostic Agents Shortness Of Breath    Only with nuclear stress test per pt Only with nuclear stress test per pt   . Penicillins Rash    Rash at injection site. Pt states she can take  Amoxicillin & Keflex   . Escitalopram   . Lithium   . Erythromycin Nausea Only and Nausea And Vomiting  Other reaction(s): Other (See Comments) dehydration       Past Medical History: Past Medical History:  Diagnosis Date  . Arthritis   . Depression    controlled  . Diabetes mellitus without complication ([place])    managed well;   [place] GERD (gastroesophageal reflux disease)   . Hepatitis   . HLD (hyperlipidemia)   . Hypertension    somewhat controlled  . 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 Polymyalgia rheumatica ([place])   . Sleep apnea   . Stroke [facility])      Past Surgical History: Past Surgical History:  Procedure Laterality Date  . ABDOMINAL HYSTERECTOMY    . BILATERAL SALPINGOOPHORECTOMY    . ESOPHAGEAL DILATION    . OOPHORECTOMY    . REDUCTION MAMMAPLASTY Bilateral 2003  . REPLACEMENT TOTAL KNEE Right   . TEE WITHOUT CARDIOVERSION N/A 03/31/2019   Procedure: TRANSESOPHAGEAL ECHOCARDIOGRAM (TEE);  Surgeon: Corey Skains, MD;  Location: [facility] ORS;  Service: Cardiovascular;  Laterality: N/A;  . TONSILLECTOMY AND ADENOIDECTOMY       Family History: Family History  Problem Relation Age of Onset  . CAD Brother   . Kidney cancer Mother   . Breast cancer Cousin      Social History: Social History   Socioeconomic History  . Marital status: Single    Spouse name: Not on file  . Number of children: Not on file  . Years of education: Not on file  . Highest education level: Not on file  Occupational History  . Not on file  Social Needs  . Financial resource strain: Not on file  . Food insecurity    Worry: Not on file    Inability: Not on file  . Transportation needs    Medical: Not on file    Non-medical: Not on file  Tobacco Use  . Smoking status: Never Smoker  . Smokeless tobacco: Never Used  Substance and Sexual Activity  . Alcohol use: No    Alcohol/week: 0.0 standard drinks  . Drug use: No  . Sexual activity: Yes    Birth  control/protection: Surgical  Lifestyle  . Physical activity    Days per week: Not on file    Minutes per session: Not on file  . Stress: Not on file  Relationships  . Social Herbalist on phone: Not on file    Gets together: Not on file    Attends religious service: Not on file    Active member of club or organization: Not on file    Attends meetings of clubs or organizations: Not on file    Relationship status: Not on file  . Intimate partner violence    Fear of current or ex partner: Not on file    Emotionally abused: Not on file    Physically abused: Not on file    Forced sexual activity: Not on file  Other Topics Concern  . Not on file  Social History Narrative  . Not on file     Review of Systems: Gen: denies any c/o  HEENT: denies any c/o  CV: denies any c/o  Resp: denies any c/o  [id number] any c/o  GU : denies any c/o  MS: denies any c/o  Derm:   denies any c/o  Psych:denies any c/o  Heme: denies any c/o  Neuro: denies any c/o  Endocrine: denies any c/o   Vital Signs: Blood pressure (!) 151/49, pulse 69,  temperature 97.8 F (36.6 C), temperature source Oral, resp. rate 18, height 5\' 1"  (1.549 m), weight 79.4 kg, SpO2 95 %.   Intake/Output Summary (Last 24 hours) at 05/14/2019 1219 Last data filed at 05/14/2019 1010 Gross per 24 hour  Intake 1070.32 ml  Output 2950 ml  Net -1879.68 ml    Weight trends: [employer]   05/11/19 1847  Weight: 79.4 kg   Physical Exam: General:  frail elderly, laying in bed, eating dinner, sitter in room  HEENT Anicteric, moist oral mucus membranes  Neck:  supple  Lungs: Clear to auscultation b/l  Heart::  regular  Abdomen: soft  Extremities:  no edema  Neurologic: Alert, able to answer simple questions  Skin: No acute rashes    Lab results: Basic Metabolic Panel: Recent Labs  Lab 05/12/19 0513 05/13/19 0429 05/14/19 0350  NA 135 139 137  K 4.2 4.3 3.9  CL 105 108 105  CO2 19* 21* 22  GLUCOSE  354* 194* 205*  BUN 50* 64* 64*  CREATININE 3.14* 2.98* 3.03*  CALCIUM 8.6* 8.8* 8.5*  MG 1.7  --   --   PHOS 3.8  --   --     Liver Function Tests: Recent Labs  Lab 05/11/19 1857  AST 27  ALT 20  ALKPHOS 76  BILITOT 1.1  PROT 6.1*  ALBUMIN 2.8*   No results for input(s): LIPASE, AMYLASE in the last 168 hours. No results for input(s): AMMONIA in the last 168 hours.  CBC: Recent Labs  Lab 05/11/19 1857 05/13/19 0429 05/14/19 0350  WBC 17.2* 19.1* 10.7*  NEUTROABS 15.7*  --   --   HGB 8.8* 8.1* 7.9*  HCT 27.0* 25.0* 24.2*  MCV 93.1 92.3 90.0  PLT 351 320 285    Cardiac Enzymes: No results for input(s): CKTOTAL, TROPONINI in the last 168 hours.  BNP: Invalid input(s): POCBNP  CBG: Recent Labs  Lab 05/13/19 0755 05/13/19 1144 05/13/19 1652 05/13/19 2106 05/14/19 0849  GLUCAP 209* 284* 254* 170* 194*    Microbiology: Recent Results (from the past 720 hour(s))  SARS Coronavirus 2 [facility] order, Performed in [facility] hospital lab) Nasopharyngeal Nasopharyngeal Swab     Status: None   Collection Time: 05/02/19 12:32 PM   Specimen: Nasopharyngeal Swab  Result Value Ref Range Status   SARS Coronavirus 2 NEGATIVE NEGATIVE Final    Comment: (NOTE) If result is NEGATIVE SARS-CoV-2 target nucleic acids are NOT DETECTED. The SARS-CoV-2 RNA is generally detectable in upper and lower  respiratory specimens during the acute phase of infection. The lowest  concentration of SARS-CoV-2 viral copies this assay can detect is 250  copies / mL. A negative result does not preclude SARS-CoV-2 infection  and should not be used as the sole basis for treatment or other  patient management decisions.  A negative result may occur with  improper specimen collection / handling, submission of specimen other  than nasopharyngeal swab, presence of viral mutation(s) within the  areas targeted by this assay, and inadequate number of viral copies  (<250 copies / mL). A negative  result must be combined with clinical  observations, patient history, and epidemiological information. If result is POSITIVE SARS-CoV-2 target nucleic acids are DETECTED. The SARS-CoV-2 RNA is generally detectable in upper and lower  respiratory specimens dur ing the acute phase of infection.  Positive  results are indicative of active infection with SARS-CoV-2.  Clinical  correlation with patient history and other diagnostic information is  necessary to  determine patient infection status.  Positive results do  not rule out bacterial infection or co-infection with other viruses. If result is PRESUMPTIVE POSTIVE SARS-CoV-2 nucleic acids MAY BE PRESENT.   A presumptive positive result was obtained on the submitted specimen  and confirmed on repeat testing.  While 2019 novel coronavirus  (SARS-CoV-2) nucleic acids may be present in the submitted sample  additional confirmatory testing may be necessary for epidemiological  and / or clinical management purposes  to differentiate between  SARS-CoV-2 and other Sarbecovirus currently known to infect humans.  If clinically indicated additional testing with an alternate test  methodology [phone]) is advised. The SARS-CoV-2 RNA is generally  detectable in upper and lower respiratory sp ecimens during the acute  phase of infection. The expected result is Negative. Fact Sheet for Patients:  StrictlyIdeas.no Fact Sheet for Healthcare Providers: BankingDealers.co.za This test is not yet approved or cleared by the [place] FDA and has been authorized for detection and/or diagnosis of SARS-CoV-2 by FDA under an Emergency Use Authorization (EUA).  This EUA will remain in effect (meaning this test can be used) for the duration of the COVID-19 declaration under Section 564(b)(1) of the Act, 21 U.S.C. section 360bbb-3(b)(1), unless the authorization is terminated or revoked sooner. Performed at  [facility], [address] [place], [place] [address]   MRSA PCR Screening     Status: None   Collection Time: 05/03/19  3:15 AM   Specimen: Nasopharyngeal  Result Value Ref Range Status   MRSA by PCR NEGATIVE NEGATIVE Final    Comment:        The GeneXpert MRSA Assay (FDA approved for NASAL specimens only), is one component of a comprehensive MRSA colonization surveillance program. It is not intended to diagnose MRSA infection nor to guide or monitor treatment for MRSA infections. Performed at [facility], [address] [place], [place] [address]   Blood culture (routine x 2)     Status: None (Preliminary result)   Collection Time: 05/11/19  6:57 PM   Specimen: BLOOD  Result Value Ref Range Status   Specimen Description BLOOD BLOOD LEFT FOREARM  Final   Special Requests   Final    BOTTLES DRAWN AEROBIC AND ANAEROBIC Blood Culture adequate volume   Culture   Final    NO GROWTH 3 DAYS Performed at [facility], [address] [place], [place] [address]    Report Status PENDING  Incomplete  Blood culture (routine x 2)     Status: None (Preliminary result)   Collection Time: 05/11/19  6:58 PM   Specimen: BLOOD  Result Value Ref Range Status   Specimen Description BLOOD RIGHT ARM  Final   Special Requests   Final    BOTTLES DRAWN AEROBIC AND ANAEROBIC Blood Culture adequate volume   Culture   Final    NO GROWTH 3 DAYS Performed at [facility], [address] [place], [place] [address]    Report Status PENDING  Incomplete  SARS Coronavirus 2 [facility] order, Performed in [place] hospital lab) Nasopharyngeal Nasopharyngeal Swab     Status: None   Collection Time: 05/11/19  7:48 PM   Specimen: Nasopharyngeal Swab  Result Value Ref Range Status   SARS Coronavirus 2 NEGATIVE NEGATIVE Final    Comment: (NOTE) If result is NEGATIVE SARS-CoV-2 target nucleic acids are NOT DETECTED. The SARS-CoV-2 RNA is generally detectable  in upper and lower  respiratory specimens during the acute phase of infection. The lowest  concentration of SARS-CoV-2 viral copies this assay can detect is 250  copies / mL. A negative result does not preclude SARS-CoV-2 infection  and should not be used as the sole basis for treatment or other  patient management decisions.  A negative result may occur with  improper specimen collection / handling, submission of specimen other  than nasopharyngeal swab, presence of viral mutation(s) within the  areas targeted by this assay, and inadequate number of viral copies  (<250 copies / mL). A negative result must be combined with clinical  observations, patient history, and epidemiological information. If result is POSITIVE SARS-CoV-2 target nucleic acids are DETECTED. The SARS-CoV-2 RNA is generally detectable in upper and lower  respiratory specimens dur ing the acute phase of infection.  Positive  results are indicative of active infection with SARS-CoV-2.  Clinical  correlation with patient history and other diagnostic information is  necessary to determine patient infection status.  Positive results do  not rule out bacterial infection or co-infection with other viruses. If result is PRESUMPTIVE POSTIVE SARS-CoV-2 nucleic acids MAY BE PRESENT.   A presumptive positive result was obtained on the submitted specimen  and confirmed on repeat testing.  While 2019 novel coronavirus  (SARS-CoV-2) nucleic acids may be present in the submitted sample  additional confirmatory testing may be necessary for epidemiological  and / or clinical management purposes  to differentiate between  SARS-CoV-2 and other Sarbecovirus currently known to infect humans.  If clinically indicated additional testing with an alternate test  methodology [phone]) is advised. The SARS-CoV-2 RNA is generally  detectable in upper and lower respiratory sp ecimens during the acute  phase of infection. The expected result is  Negative. Fact Sheet for Patients:  StrictlyIdeas.no Fact Sheet for Healthcare Providers: BankingDealers.co.za This test is not yet approved or cleared by the [place] FDA and has been authorized for detection and/or diagnosis of SARS-CoV-2 by FDA under an Emergency Use Authorization (EUA).  This EUA will remain in effect (meaning this test can be used) for the duration of the COVID-19 declaration under Section 564(b)(1) of the Act, 21 U.S.C. section 360bbb-3(b)(1), unless the authorization is terminated or revoked sooner. Performed at [facility], [address] [place], [place] [address]   MRSA PCR Screening     Status: None   Collection Time: 05/12/19 12:20 AM   Specimen: Nasal Mucosa; Nasopharyngeal  Result Value Ref Range Status   MRSA by PCR NEGATIVE NEGATIVE Final    Comment:        The GeneXpert MRSA Assay (FDA approved for NASAL specimens only), is one component of a comprehensive MRSA colonization surveillance program. It is not intended to diagnose MRSA infection nor to guide or monitor treatment for MRSA infections. Performed at [facility], [address] [place], [place] [address]      Coagulation Studies: No results for input(s): LABPROT, INR in the last 72 hours.  Urinalysis: No results for input(s): COLORURINE, LABSPEC, PHURINE, GLUCOSEU, HGBUR, BILIRUBINUR, KETONESUR, PROTEINUR, UROBILINOGEN, NITRITE, LEUKOCYTESUR in the last 72 hours.  Invalid input(s): APPERANCEUR      Imaging:  No results found.   Assessment & Plan: Pt is a 78 y.o. caucasian  female with medical problems of chronic kidney disease stage IV, depression, diabetes type 2, hypertension, spinal stenosis, stroke 2017, myopathy 2014, osteoarthritis.  History of right knee replacement, was admitted on 05/11/2019 with weakness, hypoxic resp failure.   # Chronic kidney disease stage IV # Diabetes type 2 with CKD #  confusion # Anemia, iron deficiency  Her CKD is thought to be secondary to DM and HTN Baseline of 2.5 from 8/9.  Increase in creatinine is progression vs volume related  Careful diuresis at present, getting iv lasix Agree to hold losartan No Acute indication for HD at present.  Will follow along and monitor closelty       LOS: 3 Habib Kise 9/16/202012:19 PM    Note: This note was prepared with Dragon dictation. Any transcription errors are unintentional

## 2019-05-14 NOTE — Evaluation (Signed)
Clinical/Bedside Swallow Evaluation Patient Details  Name: Holly Conner MRN: [id number] Date of Birth: May 30, 1941  Today's Date: 05/14/2019 Time: SLP Start Time (ACUTE ONLY): [id number] SLP Stop Time (ACUTE ONLY): 1345 SLP Time Calculation (min) (ACUTE ONLY): 60 min  Past Medical History:  Past Medical History:  Diagnosis Date  . Arthritis   . Depression    controlled  . Diabetes mellitus without complication ([place])    managed well;   Marland Kitchen GERD (gastroesophageal reflux disease)   . Hepatitis   . HLD (hyperlipidemia)   . Hypertension    somewhat controlled  . 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 Polymyalgia rheumatica ([place])   . Sleep apnea   . Stroke [facility])    Past Surgical History:  Past Surgical History:  Procedure Laterality Date  . ABDOMINAL HYSTERECTOMY    . BILATERAL SALPINGOOPHORECTOMY    . ESOPHAGEAL DILATION    . OOPHORECTOMY    . REDUCTION MAMMAPLASTY Bilateral 2003  . REPLACEMENT TOTAL KNEE Right   . TEE WITHOUT CARDIOVERSION N/A 03/31/2019   Procedure: TRANSESOPHAGEAL ECHOCARDIOGRAM (TEE);  Surgeon: Corey Skains, MD;  Location: [facility] ORS;  Service: Cardiovascular;  Laterality: N/A;  . TONSILLECTOMY AND ADENOIDECTOMY     HPI:  Pt is a 78 y.o. female w/ medical history including GERD w/ Esophageal dilation, Depression, DM, HTN, strokes who was recently admitted to this facility. Unsure of pt's baseline Cognitive status; see recent Head CT where atrophy was noted.  She returns w/ cough and SOB.  The patient reportedly has developed acute onset of shortness of breath, fever, and weakness over the last 24 hours.  She was just recently hospitalized for multiple strokes, as well as recent admission for CHF with pneumonia.  She had been recovering well until the last several days.  She states that she feels incredibly short of breath and weak.  She has reported intermittent confusion due to her recent strokes.  Pt was recently admitted/discharged w/ dx of  Large bilateral pleural effusions Extensive airspace disease in both lungs, most confluent in the lower lobes but also noted in both upper lobes compatible with multifocal pneumonia; Cardiomegaly; Diffuse coronary artery disease, per CT scan.  Current CXR reveals worsening disease process.  Pt denies any difficulty swallowing; has not changed her eating habits nor lost weight.  NSG reported pt is eating "well" at her meals w/ no overt s/s of aspiration noted.  NSG reported coughing when pt attempted swallowing Pills w/ water this morning.    Assessment / Plan / Recommendation Clinical Impression  Pt appears to present w/ adequate oropharyngeal phase swallowing w/ no overt s/s of aspiration noted during po trials at beside evaluation. Pt alert, fed self w/ min setup support. She was verbally responsive and followed instructions from SLP. She declined further trials stating she had recently eaten Lunch meal (NSG staff denied any overt s/s of aspiration during the Lunch meal). Pt consumed trials of thin liquids, purees, and soft solids w/ no decline in vocal quality or respiratory status during/post trials. No coughing. Oral phase appeared [facility] for bolus mastication, A-P transfer and oral clearing. Noted lingual sweeping to complete clearing. OM exam appeared [facility] w/ no unilateral weakness. Recommend continued current regular diet w/ thin liquids; general aspiration precautions; GERD/Reflux precautions (baseline). Recommend using ice cream/applesauce/yogurt for swallowing (especially Larger ones) for easier swallowing -- if any difficulty or overt s/s of aspiration noted when attempting to swallow w/ liquids. Swallowing Pills can be  awkward and increase risk for aspriation, coughing when using liquids; a puree may be helpful for ease of swallowing especially if there are many Pills to swallow.  SLP Visit Diagnosis: Dysphagia, unspecified (R13.10)(Pills)    Aspiration Risk  (reduced following general aspiration  precautions)    Diet Recommendation  Regular diet w/ thin liquids; general aspiration precautions; Reflux/GERD precautions baseline  Medication Administration: Whole meds with puree(for safer swallowing overall)    Other  Recommendations Recommended Consults: (Dietician f/u) Oral Care Recommendations: Oral care BID;Patient independent with oral care;Staff/trained caregiver to provide oral care Other Recommendations: (n/a)   Follow up Recommendations None      Frequency and Duration (n/a)  (n/a)       Prognosis Prognosis for Safe Diet Advancement: Good Barriers to Reach Goals: (GERD)      Swallow Study   General Date of Onset: 05/11/19 HPI: Pt is a 78 y.o. female w/ medical history including GERD w/ Esophageal dilation, Depression, DM, HTN, strokes who was recently admitted to this facility. Unsure of pt's baseline Cognitive status; see recent Head CT where atrophy was noted.  She returns w/ cough and SOB.  The patient reportedly has developed acute onset of shortness of breath, fever, and weakness over the last 24 hours.  She was just recently hospitalized for multiple strokes, as well as recent admission for CHF with pneumonia.  She had been recovering well until the last several days.  She states that she feels incredibly short of breath and weak.  She has reported intermittent confusion due to her recent strokes.  Pt was recently admitted/discharged w/ dx of Large bilateral pleural effusions Extensive airspace disease in both lungs, most confluent in the lower lobes but also noted in both upper lobes compatible with multifocal pneumonia; Cardiomegaly; Diffuse coronary artery disease, per CT scan.  Current CXR reveals worsening disease process.  Pt denies any difficulty swallowing; has not changed her eating habits nor lost weight.  NSG reported pt is eating "well" at her meals w/ no overt s/s of aspiration noted.  NSG reported coughing when pt attempted swallowing Pills w/ water this  morning.  Type of Study: Bedside Swallow Evaluation Previous Swallow Assessment: 03/28/2019 - no deficits noted then Diet Prior to this Study: Regular;Thin liquids Temperature Spikes Noted: No(wbc 10.7 declining) Respiratory Status: Room air History of Recent Intubation: No Behavior/Cognition: Alert;Cooperative;Pleasant mood(unsure of baseline Cognitive status) Oral Cavity Assessment: Within Functional Limits Oral Care Completed by SLP: Recent completion by staff Oral Cavity - Dentition: Dentures, top;Dentures, bottom Vision: Functional for self-feeding Self-Feeding Abilities: Able to feed self;Needs set up Patient Positioning: Upright in bed(needed min positiong) Baseline Vocal Quality: Normal Volitional Cough: Strong Volitional Swallow: Able to elicit    Oral/Motor/Sensory Function Overall Oral Motor/Sensory Function: Within functional limits   Ice Chips Ice chips: Not tested   Thin Liquid Thin Liquid: Within functional limits Presentation: Self Fed;Straw(~8-10 trials)    Nectar Thick Nectar Thick Liquid: Not tested   Honey Thick Honey Thick Liquid: Not tested   Puree Puree: Within functional limits Presentation: Spoon(fed; 2 trials)   Solid     Solid: Within functional limits Presentation: Spoon(fed; 2 trials)        Orinda Kenner, MS, CCC-SLP Gabryela Kimbrell 05/14/2019,2:53 PM

## 2019-05-14 NOTE — Progress Notes (Signed)
Resting in bed at this time

## 2019-05-14 NOTE — Consult Note (Signed)
Consultation Note Date: 05/14/2019   Patient Name: Holly Conner  DOB: December 24, 1940  MRN: [id number]  Age / Sex: 78 y.o., female  PCP: Dion Body, MD Referring Physician: Max Sane, MD  Reason for Consultation: Establishing goals of care  HPI/Patient Profile: 78 y.o. female  with past medical history of CVA, CHF, dementia, CKD admitted on 05/11/2019 with HCAP. This is a recent readmission for the same problem. She has had several hospital visits and admissions this year. She is in LTC at [facility]. Palliative medicine consulted for Holly Conner.   Clinical Assessment and Goals of Care: Evaluated patient at bedside. She was pleasant in bed, resting, enjoying television. Tells me she feels ok for now, she does not enjoy being in the hospital. She is unable to elicit specific information regarding her state of health or her health history. She tells me her friend Holly Conner is coming up with a plan for her, and she prefers that I speak with Holly regarding her healthcare decisions.  Spoke with Holly Conner who has recently been designated as patient's legal guardian. She relates that patient has had significant cognitive declines. Holly attended a court hearing today as a rep for Holly Conner. Holly relates this charge resulted from Holly Conner cognitive declined that she compensated so well for that noone would recognize.   Holly's main [place] for Holly Conner is that she lives the remainder of her lifetime in a place where she is safe, well cared for, and unafraid.   We discussed her multiple comorbidities- and the risk for more decline. The purpose of Palliative care discussion and advanced care planning was discussed. We discussed Holly's role in this decision making process as Holly Conner's guardian. I encouraged Holly to not bear the burden of making decisions herself, rather considering what decisions  Holly Conner would make if she were clear minded and in her best days- and could hear what our discussion entailed about her future and current quality of life.   Holly expressed grief at the loss of the person that she knew several years ago who attended to her children like another grandparent. She has been surprised by the rapidity of Holly Conner's cognitive and physical decline.  Emotional support and review of Holly Conner's illnesses and trajectories were discussed.   Primary Decision Maker HCPOA- Holly Conner    SUMMARY OF RECOMMENDATIONS   - Full code for now- however, will discuss further with Holly tomorrow - today's discussion was prolonged and focused mostly on explaining Palliative role and supporting HCPOA with caregiver burden -Followup discussion planned with HCOPA tomorrow morning at 10am -Delirium/Sundowning- noted per discussion with Holly and nursing- trial Olanzapine 2.5mg  QHS can adjust upwards as needed- please continue at discharge  Code Status/Advance Care Planning:  Full code    Primary Diagnoses: Present on Admission: . Acute respiratory failure (HCC)  Allergies  Allergen Reactions  . Iodinated Diagnostic Agents Shortness Of Breath    Only with nuclear stress test per pt Only with nuclear stress test per pt   . Penicillins Rash  Rash at injection site. Pt states she can take Amoxicillin & Keflex   . Escitalopram   . Lithium   . Erythromycin Nausea Only and Nausea And Vomiting    Other reaction(s): Other (See Comments) dehydration    Review of Systems  Constitutional: Positive for activity change and fatigue.    Physical Exam Vitals signs and nursing note reviewed.  Cardiovascular:     Rate and Rhythm: Normal rate.  Pulmonary:     Effort: Pulmonary effort is normal.  Neurological:     Mental Status: She is alert.  Psychiatric:        Mood and Affect: Mood normal.        Behavior: Behavior normal.     Vital Signs: BP (!) 151/49 (BP  Location: Left Arm)   Pulse 69   Temp 97.8 F (36.6 C) (Oral)   Resp 18   Ht 5\' 1"  (1.549 m)   Wt 79.4 kg   SpO2 95%   BMI 33.08 kg/m  Pain Scale: 0-10 POSS *See Group Information*: 1-Acceptable,Awake and alert Pain Score: 0-No pain   SpO2: SpO2: 95 % O2 Device:SpO2: 95 % O2 Flow Rate: .O2 Flow Rate (L/min): 2 L/min  IO: Intake/output summary:   Intake/Output Summary (Last 24 hours) at 05/14/2019 1747 Last data filed at 05/14/2019 1231 Gross per 24 hour  Intake 830.32 ml  Output 2750 ml  Net -1919.68 ml    LBM: Last BM Date: 05/10/19 Baseline Weight: Weight: 79.4 kg Most recent weight: Weight: 79.4 kg     Palliative Assessment/Data: PPS: 50%     Thank you for this consult. Palliative medicine will continue to follow and assist as needed.   Time In: 1545 Time Out:1715 Time Total: 90 minutes Greater than 50%  of this time was spent counseling and coordinating care related to the above assessment and plan.  Signed by: Mariana Kaufman, AGNP-C Palliative Medicine    Please contact Palliative Medicine Team phone at [phone] for questions and concerns.  For individual provider: See Shea Evans

## 2019-05-14 NOTE — Evaluation (Signed)
Physical Therapy Evaluation Patient Details Name: Holly Conner MRN: [id number] DOB: 05-01-1941 Today's Date: 05/14/2019   History of Present Illness  Pt is 78 year old female patient with diabetes mellitus type 2, GERD, CVA, CKD stage III. She has recent admissions, and was most recently admitted for coughing, fever, SOB, and weakness.    Clinical Impression  Upon entry, pt is in sidelying with sitter in room with her. When assessing orientation, she was unable to recall the month/day, however able to state place, year, and current president. When discussing her home environment, she discussed a single-floor home with a friend that she had lived in before. After viewing previous notes, it was discovered that she was admitted from [facility] medical care. She reports to have had at least one fall in the last 6 months, but unable to state how many in the last 3 months. Evaluation began with general strength assessment of UE and LE; pt was able to grip fingers. LE strength was generally 4+/5; bilateral hip flexor strength was a 3+/5. She was able to ambulate ~100 ft with the use of a RW. She walked with a step-through pattern with reuced speed and stride length. Pt oxygen and HR were monitored throughout walk; they remained stable, however pt had fast labored breathing and reported fatigue. Pt needed redirection/tactile cues throughout treatment. Pt will to continue to benefit from skilled PT to address reduced strength and activity tolerance. Current recommendation for follow up care is SNF due to acute decline in functional status and decreased caregiver support. Pt may benefit from transition to memory care unit/long term facility due to cognitive deficits/ safety concerns.     Follow Up Recommendations SNF;Supervision/Assistance - 24 hour    Equipment Recommendations  Rolling walker with 5" wheels    Recommendations for Other Services       Precautions / Restrictions Precautions Precautions:  Fall Restrictions Weight Bearing Restrictions: No      Mobility  Bed Mobility Overal bed mobility: Needs Assistance Bed Mobility: Supine to Sit     Supine to sit: Supervision     General bed mobility comments: Pt able to roll and sit up in bed with only the use of bedrails; supervision required for safety  Transfers Overall transfer level: Needs assistance Equipment used: Rolling walker (2 wheeled) Transfers: Sit to/from Stand Sit to Stand: Min assist         General transfer comment: Pt able to transfer sit<>stand with min assist for safety.   Ambulation/Gait Ambulation/Gait assistance: Min assist Gait Distance (Feet): 100 Feet Assistive device: Rolling walker (2 wheeled) Gait Pattern/deviations: Step-through pattern;Decreased stride length     General Gait Details: Pt was able to ambulate ~100 ft with the use of a RW. She walked with a step-through pattern with reuced speed and stride length. Pt oxygen and HR were monitored throughout walk; they remained stable, however pt had fast labored breathing.  Stairs            Wheelchair Mobility    Modified Rankin (Stroke Patients Only)       Balance Overall balance assessment: Needs assistance Sitting-balance support: Feet supported Sitting balance-Leahy Scale: Good     Standing balance support: Bilateral upper extremity supported Standing balance-Leahy Scale: Good                               Pertinent Vitals/Pain Pain Assessment: No/denies pain    Home Living Family/patient expects to be discharged  to:: Private residence Living Arrangements: Non-relatives/Friends Available Help at Discharge: Friend(s);Available PRN/intermittently Type of Home: House Home Access: Stairs to enter Entrance Stairs-Rails: None Entrance Stairs-Number of Steps: 2 Home Layout: One level Home Equipment: Cane - single point      Prior Function Level of Independence: Independent with assistive device(s)                Hand Dominance        Extremity/Trunk Assessment   Upper Extremity Assessment Upper Extremity Assessment: Overall WFL for tasks assessed    Lower Extremity Assessment Lower Extremity Assessment: Generalized weakness    Cervical / Trunk Assessment Cervical / Trunk Assessment: Kyphotic  Communication      Cognition Arousal/Alertness: Awake/alert Behavior During Therapy: WFL for tasks assessed/performed Overall Cognitive Status: Within Functional Limits for tasks assessed                                 General Comments: Pt unable to recall the month/day, but able to state place, year, and current president. When discussing her home D/C environment, she talked about a single-floor home with a friend that she had lived in before. After viewing previous notes, it was discovered that she was admitted from [facility] medical care.      General Comments      Exercises Other Exercises Other Exercises: Pt. amb ~100 ft   Assessment/Plan    PT Assessment Patient needs continued PT services  PT Problem List Decreased strength;Decreased activity tolerance;Decreased balance;Decreased mobility       PT Treatment Interventions Gait training;Stair training;Functional mobility training;Therapeutic activities;Therapeutic exercise;Patient/family education    PT Goals (Current goals can be found in the Care Plan section)  Acute Rehab PT Goals Patient Stated Goal: to return home PT Goal Formulation: With patient Time For Goal Achievement: 05/28/19 Potential to Achieve Goals: Fair    Frequency Min 2X/week   Barriers to discharge        Co-evaluation               AM-PAC PT "6 Clicks" Mobility  Outcome Measure Help needed turning from your back to your side while in a flat bed without using bedrails?: None Help needed moving from lying on your back to sitting on the side of a flat bed without using bedrails?: A Little Help needed moving to  and from a bed to a chair (including a wheelchair)?: A Little Help needed standing up from a chair using your arms (e.g., wheelchair or bedside chair)?: A Little Help needed to walk in hospital room?: A Little Help needed climbing 3-5 steps with a railing? : A Lot 6 Click Score: 18    End of Session Equipment Utilized During Treatment: Gait belt Activity Tolerance: Patient limited by fatigue Patient left: in chair;with call bell/phone within reach;with chair alarm set;with nursing/sitter in room Nurse Communication: Mobility status PT Visit Diagnosis: Unsteadiness on feet (R26.81);History of falling (Z91.81)    Time: [id number] PT Time Calculation (min) (ACUTE ONLY): 32 min   Charges:   PT Evaluation $PT Eval Low Complexity: 1 Low PT Treatments $Therapeutic Activity: 8-22 mins        Chick Cousins, SPT 05/14/2019, 1:26 PM

## 2019-05-14 NOTE — Progress Notes (Signed)
Pt crying when asked what is wrong she states " These side rails are up , shaking at side rail , can we take these down?" Educated pt that they are used for safety as a fall precaution , pt is crying redirection semi-helpful , sitter states she has been rolling back and forth in bed. Will notify MD of increased anxiety

## 2019-05-14 NOTE — TOC Progression Note (Signed)
Transition of Care [facility]) - Progression Note    Patient Details  Name: Holly Conner MRN: [id number] Date of Birth: Mar 31, 1941  Transition of Care [facility]) CM/SW [place], RN Phone Number: 05/14/2019, 11:49 AM  Clinical Narrative:    Reached out the the POA friend [name] and did not get an answer, Left a VM requesting a call back and left my contact information   Expected Discharge Plan: [place] Barriers to Discharge: Continued Medical Work up  Expected Discharge Plan and Services Expected Discharge Plan: [place] arrangements for the past 2 months: [place]                                       Social Determinants of Health (SDOH) Interventions    Readmission Risk Interventions Readmission Risk Prevention Plan 05/12/2019  Transportation Screening Complete  Medication Review [employer]) Referral to Pharmacy  PCP or Specialist appointment within 3-5 days of discharge Complete  HRI or Home Care Consult Complete  SW Recovery Care/Counseling Consult Complete  Palliative Care Screening Not Applicable  Skilled Nursing Facility Complete  Some recent data might be hidden

## 2019-05-14 NOTE — TOC Progression Note (Signed)
Transition of Care [facility]) - Progression Note    Patient Details  Name: Holly Conner MRN: [id number] Date of Birth: 09-10-1940  Transition of Care [facility]) CM/SW [place], RN Phone Number: 05/14/2019, 1:41 PM  Clinical Narrative:    Talked with [name] on the phone, she stated that she is the patient's guardian and that they plan is still to go to [facility] at [place].   She was unaware of the patient being confused and pulling out IVs.  I encouraged her to call and speak with the bedside nurse for the patient to discuss the patient's most recent disposition and behaviors.  She agreed to call and talk to the bedside nurse.  She said she has tried to call the patient but the patient was asleep  I notified the bedside nurse that Apolonio Schneiders is seeking inforamtion on the patient and that she was unaware of the confusion.  I requested if Apolonio Schneiders has not called her if she would call Apolonio Schneiders to give an update on the patient Expected Discharge Plan: [place] Barriers to Discharge: Continued Medical Work up  Expected Discharge Plan and Services Expected Discharge Plan: [place] arrangements for the past 2 months: [place]                                       Social Determinants of Health (SDOH) Interventions    Readmission Risk Interventions Readmission Risk Prevention Plan 05/12/2019  Transportation Screening Complete  Medication Review [employer]) Referral to Pharmacy  PCP or Specialist appointment within 3-5 days of discharge Complete  HRI or Home Care Consult Complete  SW Recovery Care/Counseling Consult Complete  Palliative Care Screening Not Applicable  Skilled Nursing Facility Complete  Some recent data might be hidden

## 2019-05-14 NOTE — Progress Notes (Signed)
New order for PRN Geodon 10mg  IM , Zyprexa 2.5mg  at HS .

## 2019-05-15 DIAGNOSIS — J189 Pneumonia, unspecified organism: Secondary | ICD-10-CM

## 2019-05-15 DIAGNOSIS — J9621 Acute and chronic respiratory failure with hypoxia: Secondary | ICD-10-CM

## 2019-05-15 DIAGNOSIS — R410 Disorientation, unspecified: Secondary | ICD-10-CM

## 2019-05-15 DIAGNOSIS — F339 Major depressive disorder, recurrent, unspecified: Secondary | ICD-10-CM

## 2019-05-15 LAB — GLUCOSE, CAPILLARY
Glucose-Capillary: 214 mg/dL — ABNORMAL HIGH (ref 70–99)
Glucose-Capillary: 383 mg/dL — ABNORMAL HIGH (ref 70–99)
Glucose-Capillary: 110 mg/dL — ABNORMAL HIGH (ref 70–99)
Glucose-Capillary: 237 mg/dL — ABNORMAL HIGH (ref 70–99)

## 2019-05-15 LAB — CBC
HCT: 25 % — ABNORMAL LOW (ref 36.0–46.0)
Hemoglobin: 8.2 g/dL — ABNORMAL LOW (ref 12.0–15.0)
MCH: 29.6 pg (ref 26.0–34.0)
MCHC: 32.8 g/dL (ref 30.0–36.0)
MCV: 90.3 fL (ref 80.0–100.0)
Platelets: 278 10*3/uL (ref 150–400)
RBC: 2.77 MIL/uL — ABNORMAL LOW (ref 3.87–5.11)
RDW: 14.2 % (ref 11.5–15.5)
WBC: 7 10*3/uL (ref 4.0–10.5)
nRBC: 0 % (ref 0.0–0.2)

## 2019-05-15 LAB — BASIC METABOLIC PANEL
Anion gap: 11 (ref 5–15)
BUN: 58 mg/dL — ABNORMAL HIGH (ref 8–23)
CO2: 22 mmol/L (ref 22–32)
Calcium: 8.6 mg/dL — ABNORMAL LOW (ref 8.9–10.3)
Chloride: 106 mmol/L (ref 98–111)
Creatinine, Ser: 2.91 mg/dL — ABNORMAL HIGH (ref 0.44–1.00)
GFR calc Af Amer: 17 mL/min — ABNORMAL LOW (ref >60)
GFR calc non Af Amer: 15 mL/min — ABNORMAL LOW (ref >60)
Glucose, Bld: 195 mg/dL — ABNORMAL HIGH (ref 70–99)
Potassium: 3.6 mmol/L (ref 3.5–5.1)
Sodium: 139 mmol/L (ref 135–145)

## 2019-05-15 LAB — PROCALCITONIN: Procalcitonin: 7.32 ng/mL

## 2019-05-15 MED ORDER — CITALOPRAM HYDROBROMIDE 20 MG PO TABS
20.0000 mg | ORAL_TABLET | Freq: Every day | ORAL | Status: DC
  Administered 2019-05-15 – 2019-05-16 (×2): 20 mg via ORAL
  Filled 2019-05-15 (×2): qty 1

## 2019-05-15 MED ORDER — INSULIN DETEMIR 100 UNIT/ML ~~LOC~~ SOLN
8.0000 [IU] | Freq: Two times a day (BID) | SUBCUTANEOUS | Status: DC
  Administered 2019-05-15 – 2019-05-16 (×2): 8 [IU] via SUBCUTANEOUS
  Filled 2019-05-15 (×3): qty 0.08

## 2019-05-15 MED ORDER — AZITHROMYCIN 500 MG PO TABS
500.0000 mg | ORAL_TABLET | Freq: Every day | ORAL | Status: DC
  Administered 2019-05-16: 10:00:00 500 mg via ORAL
  Filled 2019-05-15: qty 1

## 2019-05-15 MED ORDER — OLANZAPINE 5 MG PO TBDP: 2.5000 mg | ORAL_TABLET | Freq: Every day | ORAL | 0 refills | Status: AC

## 2019-05-15 MED ORDER — INSULIN ASPART 100 UNIT/ML ~~LOC~~ SOLN
6.0000 [IU] | Freq: Three times a day (TID) | SUBCUTANEOUS | Status: DC
  Administered 2019-05-16: 10:00:00 6 [IU] via SUBCUTANEOUS
  Filled 2019-05-15: qty 1

## 2019-05-15 NOTE — NC FL2 (Signed)
[place] LEVEL OF CARE SCREENING TOOL     IDENTIFICATION  Patient Name: Holly Conner Birthdate: 05/13/1941 Sex: female Admission Date (Current Location): 05/11/2019  [place] and [place] Number:  Engineering geologist and Address:  [facility], [address], [place], [place] [address]      Provider Number: [id number]  Attending Physician Name and Address:  Max Sane, MD  Relative Name and Phone Number:  [name] [facility] [phone]    Current Level of Care: Hospital Recommended Level of Care: [place] Prior Approval Number:    Date Approved/Denied: 05/15/19 PASRR Number: [id number]  Discharge Plan: Home    Current Diagnoses: Patient Active Problem List   Diagnosis Date Noted  . History of cardioembolic cerebrovascular accident (CVA)   . Vascular dementia with behavior disturbance ([place])   . Acute on chronic systolic congestive heart failure ([place])   . Goals of care, counseling/discussion   . Advanced care planning/counseling discussion   . Palliative care by specialist   . Acute respiratory failure ([place]) 05/11/2019  . Acute respiratory failure with hypoxia ([place])   . NSTEMI (non-ST elevated myocardial infarction) ([place]) 05/02/2019  . Memory changes 04/05/2019  . Unresponsive 04/04/2019  . Unresponsive episode 04/03/2019  . Hypoglycemia 04/03/2019  . Elevated troponin 04/03/2019  . Weakness generalized 03/28/2019  . Hypertensive urgency 12/18/2018  . Stroke ([place]) 12/19/2015  . Type 2 diabetes mellitus ([place]) 12/19/2015  . HTN (hypertension) 12/19/2015  . GERD (gastroesophageal reflux disease) 12/19/2015  . HLD (hyperlipidemia) 12/19/2015  . CKD (chronic kidney disease), stage IV ([place]) 12/19/2015    Orientation RESPIRATION BLADDER Height & Weight     Self, Time, Situation, Place  Normal Incontinent Weight: 79.4 kg Height:  5\' 1"  (154.9 cm)  BEHAVIORAL SYMPTOMS/MOOD NEUROLOGICAL BOWEL NUTRITION STATUS       Incontinent    AMBULATORY STATUS COMMUNICATION OF NEEDS Skin   Extensive Assist Verbally Normal                       Personal Care Assistance Level of Assistance    Bathing Assistance: Maximum assistance Feeding assistance: Limited assistance Dressing Assistance: Limited assistance     Functional Limitations Info    Sight Info: Adequate Hearing Info: Adequate Speech Info: Adequate    SPECIAL CARE FACTORS FREQUENCY  PT (By licensed PT), OT (By licensed OT)     PT Frequency: 5 times per week OT Frequency: 5 times per week            Contractures Contractures Info: Not present    Additional Factors Info  Code Status, Allergies Code Status Info: DNR Allergies Info: Iodinated Diagnostic Agents, Penicillins, Escitalopram, Lithium, Erythromycin   Insulin Sliding Scale Info: see discharge summary       Current Medications (05/15/2019):  This is the current hospital active medication list Current Facility-Administered Medications  Medication Dose Route Frequency Provider Last Rate Last Dose  . aspirin EC tablet 81 mg  81 mg Oral Daily Max Sane, MD   81 mg at 05/15/19 0910  . atorvastatin (LIPITOR) tablet 80 mg  80 mg Oral Daily Max Sane, MD   80 mg at 05/15/19 0909  . azithromycin (ZITHROMAX) 500 mg in sodium chloride 0.9 % 250 mL IVPB  500 mg Intravenous Daily Flora Lipps, MD 250 mL/hr at 05/15/19 0928 500 mg at 05/15/19 0928  . bisacodyl (DULCOLAX) EC tablet 10 mg  10 mg Oral Daily PRN Max Sane, MD  10 mg at 05/15/19 0907  . bisacodyl (DULCOLAX) suppository 10 mg  10 mg Rectal Daily PRN Max Sane, MD      . ceFEPIme (MAXIPIME) 2 g in sodium chloride 0.9 % 100 mL IVPB  2 g Intravenous Q24H Hallaji, Sheema M, RPH 200 mL/hr at 05/14/19 1724 2 g at 05/14/19 1724  . Chlorhexidine Gluconate Cloth 2 % PADS 6 each  6 each Topical Daily Tukov-Yual, Magdalene S, NP   6 each at 05/15/19 0912  . darifenacin (ENABLEX) 24 hr tablet 7.5 mg  7.5 mg Oral Daily Manuella Ghazi,  Vipul, MD   7.5 mg at 05/15/19 1045  . dextrose 5 %-0.45 % sodium chloride infusion   Intravenous Continuous Tukov-Yual, Magdalene S, NP      . dextrose 50 % solution 25 mL  25 mL Intravenous PRN Tukov-Yual, Magdalene S, NP      . furosemide (LASIX) injection 20 mg  20 mg Intravenous BID Max Sane, MD   20 mg at 05/15/19 0907  . heparin injection 5,000 Units  5,000 Units Subcutaneous Q8H Tukov-Yual, Magdalene S, NP   5,000 Units at 05/15/19 0527  . insulin aspart (novoLOG) injection 0-5 Units  0-5 Units Subcutaneous QHS Flora Lipps, MD   3 Units at 05/14/19 2210  . insulin aspart (novoLOG) injection 0-9 Units  0-9 Units Subcutaneous TID WC Flora Lipps, MD   9 Units at 05/15/19 1150  . insulin aspart (novoLOG) injection 4 Units  4 Units Subcutaneous TID WC Max Sane, MD   4 Units at 05/15/19 1151  . insulin detemir (LEVEMIR) injection 5 Units  5 Units Subcutaneous BID Vaughan Basta, MD   5 Units at 05/15/19 0914  . loratadine (CLARITIN) tablet 10 mg  10 mg Oral Daily PRN Max Sane, MD      . metoprolol tartrate (LOPRESSOR) tablet 25 mg  25 mg Oral BID Max Sane, MD   25 mg at 05/15/19 0910  . multivitamin with minerals tablet 1 tablet  1 tablet Oral Daily Max Sane, MD   1 tablet at 05/15/19 0910  . OLANZapine zydis (ZYPREXA) disintegrating tablet 2.5 mg  2.5 mg Oral QHS Max Sane, MD   2.5 mg at 05/14/19 2211  . pyridOXINE (VITAMIN B-6) tablet 100 mg  100 mg Oral Daily Max Sane, MD   100 mg at 05/15/19 0911  . senna-docusate (Senokot-S) tablet 2 tablet  2 tablet Oral BID Max Sane, MD   2 tablet at 05/15/19 0908  . tamsulosin (FLOMAX) capsule 0.4 mg  0.4 mg Oral QHS Max Sane, MD   0.4 mg at 05/14/19 2210  . vitamin B-12 (CYANOCOBALAMIN) tablet 100 mcg  100 mcg Oral Daily Max Sane, MD   100 mcg at 05/15/19 0911  . vitamin C (ASCORBIC ACID) tablet 1,000 mg  1,000 mg Oral Daily Max Sane, MD   1,000 mg at 05/15/19 0909  . ziprasidone (GEODON) injection 10 mg  10 mg  Intramuscular Q6H PRN Max Sane, MD   10 mg at 05/15/19 1008     Discharge Medications: Please see discharge summary for a list of discharge medications.  Relevant Imaging Results:  Relevant Lab Results:   Additional Information SS [id number]  Su Hilt, RN

## 2019-05-15 NOTE — TOC Progression Note (Signed)
Transition of Care [facility]) - Progression Note    Patient Details  Name: Anajah Giampietro MRN: [id number] Date of Birth: 1940-12-17  Transition of Care [facility]) CM/SW [place], RN Phone Number: 05/15/2019, 12:11 PM  Clinical Narrative:     Notified the Physician Dr Manuella Ghazi that the patient will need a new covid test to DC to the facility since being admitted for respiratory problems.  I notified the bedside nurse also  Expected Discharge Plan: [place] Barriers to Discharge: Continued Medical Work up  Expected Discharge Plan and Services Expected Discharge Plan: [place] arrangements for the past 2 months: [place] Expected Discharge Date: 05/15/19                                     Social Determinants of Health (SDOH) Interventions    Readmission Risk Interventions Readmission Risk Prevention Plan 05/12/2019  Transportation Screening Complete  Medication Review Press photographer) Referral to Pharmacy  PCP or Specialist appointment within 3-5 days of discharge Complete  HRI or Home Care Consult Complete  SW Recovery Care/Counseling Consult Complete  Palliative Care Screening Not [place] Complete  Some recent data might be hidden

## 2019-05-15 NOTE — Progress Notes (Signed)
PRN Geodon helpful , resting in bed at this time , Abx infusing to RFA , New order for DNR placed purple bracelet to Right wrist. Will continue to monitor

## 2019-05-15 NOTE — Progress Notes (Signed)
New referral for outpatient Palliative to follow at [place] received from Palliative NP Mariana Kaufman, CMRN Deliliah Belenda Cruise aware. Plan is for discharge to [place] care pending repeat negative COVID test. Patient information given to palliative referral. [name] BSN, RN, [place] [phone]

## 2019-05-15 NOTE — Discharge Summary (Addendum)
[place] [facility] [place] NAME: Holly Conner    MR#:  [id number]  DATE OF BIRTH:  Mar 12, 1941  DATE OF ADMISSION:  05/11/2019   ADMITTING PHYSICIAN: Max Sane, MD  DATE OF DISCHARGE: 05/16/2019  PRIMARY CARE PHYSICIAN: Dion Body, MD   ADMISSION DIAGNOSIS:  Acute on chronic systolic congestive heart failure (HCC) [I50.23] Acute on chronic respiratory failure with hypoxia (HCC) [J96.21] Multifocal pneumonia [J18.9] DISCHARGE DIAGNOSIS:  Active Problems:   Acute respiratory failure (HCC)   History of cardioembolic cerebrovascular accident (CVA)   Vascular dementia with behavior disturbance (HCC)   Acute on chronic systolic congestive heart failure ([place])   Goals of care, counseling/discussion   Advanced care planning/counseling discussion   Palliative care by specialist  SECONDARY DIAGNOSIS:   Past Medical History:  Diagnosis Date  . Arthritis   . Depression    controlled  . Diabetes mellitus without complication ([place])    managed well;   [place] GERD (gastroesophageal reflux disease)   . Hepatitis   . HLD (hyperlipidemia)   . Hypertension    somewhat controlled  . 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 Polymyalgia rheumatica ([place])   . Sleep apnea   . Stroke [facility])    HOSPITAL COURSE:  Patient is an 78 year old female with history of diabetes mellitus,chronic kidney disease stage IV and CAD admitted with shortness of breath,cough fever and chills  1.Acuteon chronichypoxic respiratory failure: Initially required BiPAP and stepdown care but now on room air.  2.Sepsis:Present on admission. due to pneumonia, improved now.  3.Acute on chronic diastolic CHF -Well compensated now  4.Pneumonia -treated  5.Diabetes mellitus type 2 -Continue her home regimen and adjust as need  6.Chronic kidney disease stage IV -Stable for now.  7.Anemiaof chronic disease Hemoglobin  8.2  8.   Agitation: Likely due to acute delirium Started on olanzapine 2.5 mg every night, celexa 20 mg once daily per psych and continued at discharge -Consider Geodon 10 mg BID intramuscular for agitation if need at facility DISCHARGE CONDITIONS:  Fair CONSULTS OBTAINED:   DRUG ALLERGIES:   Allergies  Allergen Reactions  . Iodinated Diagnostic Agents Shortness Of Breath    Only with nuclear stress test per pt Only with nuclear stress test per pt   . Penicillins Rash    Rash at injection site. Pt states she can take Amoxicillin & Keflex   . Escitalopram   . Lithium   . Erythromycin Nausea Only and Nausea And Vomiting    Other reaction(s): Other (See Comments) dehydration    DISCHARGE MEDICATIONS:   Allergies as of 05/15/2019      Reactions   Iodinated Diagnostic Agents Shortness Of Breath   Only with nuclear stress test per pt Only with nuclear stress test per pt   Penicillins Rash   Rash at injection site. Pt states she can take Amoxicillin & Keflex   Escitalopram    Lithium    Erythromycin Nausea Only, Nausea And Vomiting   Other reaction(s): Other (See Comments) dehydration      Medication List    STOP taking these medications   atorvastatin 80 MG tablet Commonly known as: LIPITOR   losartan 50 MG tablet Commonly known as: COZAAR     TAKE these medications   aspirin EC 81 MG tablet Take 1 tablet (81 mg total) by mouth daily.   loratadine 10 MG tablet Commonly known as: CLARITIN Take 10 mg by mouth  daily as needed for allergies.   metoprolol tartrate 25 MG tablet Commonly known as: LOPRESSOR Take 1 tablet (25 mg total) by mouth 2 (two) times daily.   multivitamin with minerals Tabs tablet Take 1 tablet by mouth daily.   NovoLOG FlexPen 100 UNIT/ML FlexPen Generic drug: insulin aspart Inject 0-12 Units into the skin See admin instructions. Inject under the skin three times daily with meals according to sliding scale:   200 or below: 0u 201-250: 2u  251-300: 4u 301-350: 6u 351-400: 8u 401-450: 10u 450 or higher: 12u and contact physician   OLANZapine zydis 5 MG disintegrating tablet Commonly known as: ZYPREXA Take 0.5 tablets (2.5 mg total) by mouth at bedtime.   pyridOXINE 100 MG tablet Commonly known as: VITAMIN B-6 Take 100 mg by mouth daily.   solifenacin 10 MG tablet Commonly known as: VESICARE Take 1 tablet (10 mg total) by mouth daily.   tamsulosin 0.4 MG Caps capsule Commonly known as: FLOMAX Take 1 capsule (0.4 mg total) by mouth at bedtime.   Tyler Aas FlexTouch 200 UNIT/ML Sopn Generic drug: Insulin Degludec Inject 10 Units into the skin daily.   vitamin B-12 100 MCG tablet Commonly known as: CYANOCOBALAMIN Take 100 mcg by mouth daily.   vitamin C 1000 MG tablet Take 1,000 mg by mouth daily.      DISCHARGE INSTRUCTIONS:   DIET:  Renal diet DISCHARGE CONDITION:  Fair ACTIVITY:  Activity as tolerated OXYGEN:  Home Oxygen: No.  Oxygen Delivery: room air DISCHARGE LOCATION:  nursing home -palliative care to follow.  Consider transition to hospice when appropriate, avoid readmission to the hospital if at all possible  If you experience worsening of your admission symptoms, develop shortness of breath, life threatening emergency, suicidal or homicidal thoughts you must seek medical attention immediately by calling 911 or calling your MD immediately  if symptoms less severe.  You Must read complete instructions/literature along with all the possible adverse reactions/side effects for all the Medicines you take and that have been prescribed to you. Take any new Medicines after you have completely understood and accpet all the possible adverse reactions/side effects.   Please note  You were cared for by a hospitalist during your hospital stay. If you have any questions about your discharge medications or the care you received while you were in the hospital after you are discharged, you can call the unit  and asked to speak with the hospitalist on call if the hospitalist that took care of you is not available. Once you are discharged, your primary care physician will handle any further medical issues. Please note that NO REFILLS for any discharge medications will be authorized once you are discharged, as it is imperative that you return to your primary care physician (or establish a relationship with a primary care physician if you do not have one) for your aftercare needs so that they can reassess your need for medications and monitor your lab values.    On the day of Discharge:  VITAL SIGNS:  Blood pressure (!) 132/54, pulse 68, temperature 98.1 F (36.7 C), temperature source Oral, resp. rate 20, height 5\' 1"  (1.549 m), weight 79.4 kg, SpO2 93 %. PHYSICAL EXAMINATION:  GENERAL:  78 y.o.-year-old patient lying in the bed with no acute distress.  EYES: Pupils equal, round, reactive to light and accommodation. No scleral icterus. Extraocular muscles intact.  HEENT: Head atraumatic, normocephalic. Oropharynx and nasopharynx clear.  NECK:  Supple, no jugular venous distention. No thyroid enlargement, no tenderness.  LUNGS: Normal breath sounds bilaterally, no wheezing, rales,rhonchi or crepitation. No use of accessory muscles of respiration.  CARDIOVASCULAR: S1, S2 normal. No murmurs, rubs, or gallops.  ABDOMEN: Soft, non-tender, non-distended. Bowel sounds present. No organomegaly or mass.  EXTREMITIES: No pedal edema, cyanosis, or clubbing.  NEUROLOGIC: Cranial nerves II through XII are intact. Muscle strength 5/5 in all extremities. Sensation intact. Gait not checked.  PSYCHIATRIC: The patient is alert and oriented x 3.  SKIN: No obvious rash, lesion, or ulcer.  DATA REVIEW:   CBC Recent Labs  Lab 05/15/19 0434  WBC 7.0  HGB 8.2*  HCT 25.0*  PLT 278    Chemistries  Recent Labs  Lab 05/11/19 1857 05/12/19 0513  05/15/19 0434  NA 135 135   < > 139  K 4.9 4.2   < > 3.6  CL 104  105   < > 106  CO2 19* 19*   < > 22  GLUCOSE 296* 354*   < > 195*  BUN 44* 50*   < > 58*  CREATININE 2.89* 3.14*   < > 2.91*  CALCIUM 8.7* 8.6*   < > 8.6*  MG  --  1.7  --   --   AST 27  --   --   --   ALT 20  --   --   --   ALKPHOS 76  --   --   --   BILITOT 1.1  --   --   --    < > = values in this interval not displayed.     Follow-up Information    [place] Follow up on 05/21/2019.   Specialty: Cardiology Why: at 1:00p. Please enter through the [place] entrance Contact information: [place] [phone]         She remains at high risk for readmission   Management plans discussed with the patient, family and they are in agreement.  CODE STATUS: DNR   TOTAL TIME TAKING CARE OF THIS PATIENT: 45 minutes.    Max Sane M.D on 05/15/2019 at 11:40 AM  Between 7am to 6pm - Pager - [phone]  After 6pm go to [email or web address] - Proofreader  [facility]  Office  [phone]  CC: Primary care physician; Dion Body, MD   Note: This dictation was prepared with Dragon dictation along with smaller phrase technology. Any transcriptional errors that result from this process are unintentional.

## 2019-05-15 NOTE — Progress Notes (Signed)
Pt extremely agitated this shift, trying to pull at IV , given PRN Geodon

## 2019-05-15 NOTE — Consult Note (Signed)
[facility] Consult   Reason for Consult: Confusion, anxiety Referring Physician: Manuella Ghazi Patient Identification: Holly Conner MRN:  [id number] Principal Diagnosis: <principal problem not specified> Diagnosis:  Active Problems:   Acute respiratory failure ([place])   History of cardioembolic cerebrovascular accident (CVA)   Vascular dementia with behavior disturbance (HCC)   Acute on chronic systolic congestive heart failure ([place])   Goals of care, counseling/discussion   Advanced care planning/counseling discussion   Palliative care by specialist   Acute on chronic respiratory failure with hypoxia ([place])   Multifocal pneumonia   Total Time spent with patient: 30 minutes  Subjective:   Holly Conner is a 78 y.o. female patient admitted with recent episode of shortness of breath requiring BiPAP.  HPI: Patient is seen and examined.  Patient is an 78 year old female with a probable past psychiatric history significant for vascular dementia.  The patient was admitted on 9/13 secondary to chronic hypoxic respiratory failure requiring BiPAP.  She reported having a cough and fever prior to admission.  She reportedly has a history of depression.  She is alert and oriented barely to 3 today.  She stated she had seen a psychiatrist in the past, but she does not recall why.  She denied any suicidal or homicidal ideation.  She denied any auditory or visual hallucinations that she was able to recall.  Review of the electronic medical record at least to the last 6 months to year reveals no psychiatric notes.  She does appear to have been treated with citalopram 20 mg p.o. daily by her primary care provider which at least up until May 2020 appeared to be in place.  There are no mentions of worsening depression during the course of those evaluations.  During the course of this hospitalization it does appear she received some as needed Zyprexa for agitation.  Past Psychiatric History: No  psychiatric notes in the chart, but the patient did state that she had seen a psychiatrist in the past.  She is just unable to recall why or when.  Risk to Self:   Risk to Others:   Prior Inpatient Therapy:   Prior Outpatient Therapy:    Past Medical History:  Past Medical History:  Diagnosis Date  . Arthritis   . Depression    controlled  . Diabetes mellitus without complication ([place])    managed well;   Marland Kitchen GERD (gastroesophageal reflux disease)   . Hepatitis   . HLD (hyperlipidemia)   . Hypertension    somewhat controlled  . 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 Polymyalgia rheumatica ([place])   . Sleep apnea   . Stroke [facility])     Past Surgical History:  Procedure Laterality Date  . ABDOMINAL HYSTERECTOMY    . BILATERAL SALPINGOOPHORECTOMY    . ESOPHAGEAL DILATION    . OOPHORECTOMY    . REDUCTION MAMMAPLASTY Bilateral 2003  . REPLACEMENT TOTAL KNEE Right   . TEE WITHOUT CARDIOVERSION N/A 03/31/2019   Procedure: TRANSESOPHAGEAL ECHOCARDIOGRAM (TEE);  Surgeon: Corey Skains, MD;  Location: [facility] ORS;  Service: Cardiovascular;  Laterality: N/A;  . TONSILLECTOMY AND ADENOIDECTOMY     Family History:  Family History  Problem Relation Age of Onset  . CAD Brother   . Kidney cancer Mother   . Breast cancer Cousin    Family Psychiatric  History: Noncontributory Social History:  Social History   Substance and Sexual Activity  Alcohol Use No  . Alcohol/week: 0.0 standard drinks  Social History   Substance and Sexual Activity  Drug Use No    Social History   Socioeconomic History  . Marital status: Single    Spouse name: Not on file  . Number of children: Not on file  . Years of education: Not on file  . Highest education level: Not on file  Occupational History  . Not on file  Social Needs  . Financial resource strain: Not on file  . Food insecurity    Worry: Not on file    Inability: Not on file  . Transportation needs    Medical:  Not on file    Non-medical: Not on file  Tobacco Use  . Smoking status: Never Smoker  . Smokeless tobacco: Never Used  Substance and Sexual Activity  . Alcohol use: No    Alcohol/week: 0.0 standard drinks  . Drug use: No  . Sexual activity: Yes    Birth control/protection: Surgical  Lifestyle  . Physical activity    Days per week: Not on file    Minutes per session: Not on file  . Stress: Not on file  Relationships  . Social Herbalist on phone: Not on file    Gets together: Not on file    Attends religious service: Not on file    Active member of club or organization: Not on file    Attends meetings of clubs or organizations: Not on file    Relationship status: Not on file  Other Topics Concern  . Not on file  Social History Narrative  . Not on file   Additional Social History:    Allergies:   Allergies  Allergen Reactions  . Iodinated Diagnostic Agents Shortness Of Breath    Only with nuclear stress test per pt Only with nuclear stress test per pt   . Penicillins Rash    Rash at injection site. Pt states she can take Amoxicillin & Keflex   . Escitalopram   . Lithium   . Erythromycin Nausea Only and Nausea And Vomiting    Other reaction(s): Other (See Comments) dehydration     Labs:  Results for orders placed or performed during the hospital encounter of 05/11/19 (from the past 48 hour(s))  Glucose, capillary     Status: Abnormal   Collection Time: 05/13/19  4:52 PM  Result Value Ref Range   Glucose-Capillary 254 (H) 70 - 99 mg/dL   Comment 1 Notify RN   Glucose, capillary     Status: Abnormal   Collection Time: 05/13/19  9:06 PM  Result Value Ref Range   Glucose-Capillary 170 (H) 70 - 99 mg/dL  Basic metabolic panel     Status: Abnormal   Collection Time: 05/14/19  3:50 AM  Result Value Ref Range   Sodium 137 135 - 145 mmol/L   Potassium 3.9 3.5 - 5.1 mmol/L   Chloride 105 98 - 111 mmol/L   CO2 22 22 - 32 mmol/L   Glucose, Bld 205 (H) 70  - 99 mg/dL   BUN 64 (H) 8 - 23 mg/dL   Creatinine, Ser 3.03 (H) 0.44 - 1.00 mg/dL   Calcium 8.5 (L) 8.9 - 10.3 mg/dL   GFR calc non Af Amer 14 (L) >60 mL/min   GFR calc Af Amer 16 (L) >60 mL/min   Anion gap 10 5 - 15    Comment: Performed at [facility], [address] [place], [place] [address]  CBC     Status: Abnormal  Collection Time: 05/14/19  3:50 AM  Result Value Ref Range   WBC 10.7 (H) 4.0 - 10.5 K/uL   RBC 2.69 (L) 3.87 - 5.11 MIL/uL   Hemoglobin 7.9 (L) 12.0 - 15.0 g/dL   HCT 24.2 (L) 36.0 - 46.0 %   MCV 90.0 80.0 - 100.0 fL   MCH 29.4 26.0 - 34.0 pg   MCHC 32.6 30.0 - 36.0 g/dL   RDW 14.1 11.5 - 15.5 %   Platelets 285 150 - 400 K/uL   nRBC 0.0 0.0 - 0.2 %    Comment: Performed at [facility], [address] [place], [place] [address]  Procalcitonin     Status: None   Collection Time: 05/14/19  3:50 AM  Result Value Ref Range   Procalcitonin 15.19 ng/mL    Comment:        Interpretation: PCT >= 10 ng/mL: Important systemic inflammatory response, almost exclusively due to severe bacterial sepsis or septic shock. (NOTE)       Sepsis PCT Algorithm           Lower Respiratory Tract                                      Infection PCT Algorithm    ----------------------------     ----------------------------         PCT < 0.25 ng/mL                PCT < 0.10 ng/mL         Strongly encourage             Strongly discourage   discontinuation of antibiotics    initiation of antibiotics    ----------------------------     -----------------------------       PCT 0.25 - 0.50 ng/mL            PCT 0.10 - 0.25 ng/mL               OR       >80% decrease in PCT            Discourage initiation of                                            antibiotics      Encourage discontinuation           of antibiotics    ----------------------------     -----------------------------         PCT >= 0.50 ng/mL              PCT 0.26 - 0.50 ng/mL                AND        <80% decrease in PCT             Encourage initiation of                                             antibiotics       Encourage continuation           of antibiotics    ----------------------------     -----------------------------  PCT >= 0.50 ng/mL                  PCT > 0.50 ng/mL               AND         increase in PCT                  Strongly encourage                                      initiation of antibiotics    Strongly encourage escalation           of antibiotics                                     -----------------------------                                           PCT <= 0.25 ng/mL                                                 OR                                        > 80% decrease in PCT                                     Discontinue / Do not initiate                                             antibiotics Performed at [facility], [address] [place], [place] [address]   Glucose, capillary     Status: Abnormal   Collection Time: 05/14/19  8:49 AM  Result Value Ref Range   Glucose-Capillary 194 (H) 70 - 99 mg/dL  Glucose, capillary     Status: Abnormal   Collection Time: 05/14/19 12:30 PM  Result Value Ref Range   Glucose-Capillary 308 (H) 70 - 99 mg/dL  Vitamin B12     Status: None   Collection Time: 05/14/19  2:26 PM  Result Value Ref Range   Vitamin B-12 659 180 - 914 pg/mL    Comment: (NOTE) This assay is not validated for testing neonatal or myeloproliferative syndrome specimens for Vitamin B12 levels. Performed at [facility], [place] [address] [place], [place] [address]   Folate     Status: None   Collection Time: 05/14/19  2:26 PM  Result Value Ref Range   Folate 8.6 >5.9 ng/mL    Comment: Performed at [facility], [address] [place], [place] [address]  Iron and TIBC     Status: Abnormal   Collection Time: 05/14/19  2:26 PM  Result Value Ref Range   Iron 17 (  L) 28 - 170 ug/dL   TIBC 179  (L) 250 - 450 ug/dL   Saturation Ratios 10 (L) 10.4 - 31.8 %   UIBC 163 ug/dL    Comment: Performed at [facility], [address] [place], [place] [address]  Ferritin     Status: None   Collection Time: 05/14/19  2:26 PM  Result Value Ref Range   Ferritin 160 11 - 307 ng/mL    Comment: Performed at [facility], [address] [place], [place] [address]  Reticulocytes     Status: Abnormal   Collection Time: 05/14/19  2:26 PM  Result Value Ref Range   Retic Ct Pct 2.3 0.4 - 3.1 %   RBC. 2.63 (L) 3.87 - 5.11 MIL/uL   Retic Count, Absolute 61.5 19.0 - 186.0 K/uL   Immature Retic Fract 12.4 2.3 - 15.9 %    Comment: Performed at [facility], [address] [place], [place] [address]  Glucose, capillary     Status: Abnormal   Collection Time: 05/14/19  4:56 PM  Result Value Ref Range   Glucose-Capillary 257 (H) 70 - 99 mg/dL  Glucose, capillary     Status: Abnormal   Collection Time: 05/14/19  9:17 PM  Result Value Ref Range   Glucose-Capillary 253 (H) 70 - 99 mg/dL  Basic metabolic panel     Status: Abnormal   Collection Time: 05/15/19  4:34 AM  Result Value Ref Range   Sodium 139 135 - 145 mmol/L   Potassium 3.6 3.5 - 5.1 mmol/L   Chloride 106 98 - 111 mmol/L   CO2 22 22 - 32 mmol/L   Glucose, Bld 195 (H) 70 - 99 mg/dL   BUN 58 (H) 8 - 23 mg/dL   Creatinine, Ser 2.91 (H) 0.44 - 1.00 mg/dL   Calcium 8.6 (L) 8.9 - 10.3 mg/dL   GFR calc non Af Amer 15 (L) >60 mL/min   GFR calc Af Amer 17 (L) >60 mL/min   Anion gap 11 5 - 15    Comment: Performed at [facility], [address] [place], [place] [address]  Procalcitonin     Status: None   Collection Time: 05/15/19  4:34 AM  Result Value Ref Range   Procalcitonin 7.32 ng/mL    Comment:        Interpretation: PCT > 2 ng/mL: Systemic infection (sepsis) is likely, unless other causes are known. (NOTE)       Sepsis PCT Algorithm           Lower Respiratory Tract                                       Infection PCT Algorithm    ----------------------------     ----------------------------         PCT < 0.25 ng/mL                PCT < 0.10 ng/mL         Strongly encourage             Strongly discourage   discontinuation of antibiotics    initiation of antibiotics    ----------------------------     -----------------------------       PCT 0.25 - 0.50 ng/mL            PCT 0.10 - 0.25 ng/mL  OR       >80% decrease in PCT            Discourage initiation of                                            antibiotics      Encourage discontinuation           of antibiotics    ----------------------------     -----------------------------         PCT >= 0.50 ng/mL              PCT 0.26 - 0.50 ng/mL               AND       <80% decrease in PCT              Encourage initiation of                                             antibiotics       Encourage continuation           of antibiotics    ----------------------------     -----------------------------        PCT >= 0.50 ng/mL                  PCT > 0.50 ng/mL               AND         increase in PCT                  Strongly encourage                                      initiation of antibiotics    Strongly encourage escalation           of antibiotics                                     -----------------------------                                           PCT <= 0.25 ng/mL                                                 OR                                        > 80% decrease in PCT                                     Discontinue / Do not initiate  antibiotics Performed at [facility], [address] [place], [place] [address]   CBC     Status: Abnormal   Collection Time: 05/15/19  4:34 AM  Result Value Ref Range   WBC 7.0 4.0 - 10.5 K/uL   RBC 2.77 (L) 3.87 - 5.11 MIL/uL   Hemoglobin 8.2 (L) 12.0 - 15.0 g/dL   HCT 25.0 (L) 36.0 - 46.0 %   MCV  90.3 80.0 - 100.0 fL   MCH 29.6 26.0 - 34.0 pg   MCHC 32.8 30.0 - 36.0 g/dL   RDW 14.2 11.5 - 15.5 %   Platelets 278 150 - 400 K/uL   nRBC 0.0 0.0 - 0.2 %    Comment: Performed at [facility], [address] [place], [place] [address]  Glucose, capillary     Status: Abnormal   Collection Time: 05/15/19  7:38 AM  Result Value Ref Range   Glucose-Capillary 214 (H) 70 - 99 mg/dL  Glucose, capillary     Status: Abnormal   Collection Time: 05/15/19 11:28 AM  Result Value Ref Range   Glucose-Capillary 383 (H) 70 - 99 mg/dL    Current Facility-Administered Medications  Medication Dose Route Frequency Provider Last Rate Last Dose  . aspirin EC tablet 81 mg  81 mg Oral Daily Max Sane, MD   81 mg at 05/15/19 0910  . atorvastatin (LIPITOR) tablet 80 mg  80 mg Oral Daily Max Sane, MD   80 mg at 05/15/19 0909  . azithromycin (ZITHROMAX) 500 mg in sodium chloride 0.9 % 250 mL IVPB  500 mg Intravenous Daily Flora Lipps, MD 250 mL/hr at 05/15/19 0928 500 mg at 05/15/19 0928  . bisacodyl (DULCOLAX) EC tablet 10 mg  10 mg Oral Daily PRN Max Sane, MD   10 mg at 05/15/19 0907  . bisacodyl (DULCOLAX) suppository 10 mg  10 mg Rectal Daily PRN Max Sane, MD   10 mg at 05/15/19 1235  . ceFEPIme (MAXIPIME) 2 g in sodium chloride 0.9 % 100 mL IVPB  2 g Intravenous Q24H Hallaji, Sheema M, RPH 200 mL/hr at 05/14/19 1724 2 g at 05/14/19 1724  . Chlorhexidine Gluconate Cloth 2 % PADS 6 each  6 each Topical Daily Tukov-Yual, Magdalene S, NP   6 each at 05/15/19 0912  . darifenacin (ENABLEX) 24 hr tablet 7.5 mg  7.5 mg Oral Daily Manuella Ghazi, Vipul, MD   7.5 mg at 05/15/19 1045  . dextrose 5 %-0.45 % sodium chloride infusion   Intravenous Continuous Tukov-Yual, Magdalene S, NP      . dextrose 50 % solution 25 mL  25 mL Intravenous PRN Tukov-Yual, Magdalene S, NP      . furosemide (LASIX) injection 20 mg  20 mg Intravenous BID Max Sane, MD   20 mg at 05/15/19 0907  . heparin injection 5,000 Units   5,000 Units Subcutaneous Q8H Tukov-Yual, Magdalene S, NP   5,000 Units at 05/15/19 0527  . insulin aspart (novoLOG) injection 0-5 Units  0-5 Units Subcutaneous QHS Flora Lipps, MD   3 Units at 05/14/19 2210  . insulin aspart (novoLOG) injection 0-9 Units  0-9 Units Subcutaneous TID WC Flora Lipps, MD   9 Units at 05/15/19 1150  . insulin aspart (novoLOG) injection 6 Units  6 Units Subcutaneous TID WC Shah, Vipul, MD      . insulin detemir (LEVEMIR) injection 8 Units  8 Units Subcutaneous BID Manuella Ghazi, Vipul, MD      . loratadine (CLARITIN) tablet 10 mg  10 mg Oral Daily PRN Max Sane, MD      . metoprolol tartrate (LOPRESSOR) tablet 25 mg  25 mg Oral BID Max Sane, MD   25 mg at 05/15/19 0910  . multivitamin with minerals tablet 1 tablet  1 tablet Oral Daily Max Sane, MD   1 tablet at 05/15/19 0910  . OLANZapine zydis (ZYPREXA) disintegrating tablet 2.5 mg  2.5 mg Oral QHS Max Sane, MD   2.5 mg at 05/14/19 2211  . pyridOXINE (VITAMIN B-6) tablet 100 mg  100 mg Oral Daily Max Sane, MD   100 mg at 05/15/19 0911  . senna-docusate (Senokot-S) tablet 2 tablet  2 tablet Oral BID Max Sane, MD   2 tablet at 05/15/19 0908  . tamsulosin (FLOMAX) capsule 0.4 mg  0.4 mg Oral QHS Max Sane, MD   0.4 mg at 05/14/19 2210  . vitamin B-12 (CYANOCOBALAMIN) tablet 100 mcg  100 mcg Oral Daily Max Sane, MD   100 mcg at 05/15/19 0911  . vitamin C (ASCORBIC ACID) tablet 1,000 mg  1,000 mg Oral Daily Max Sane, MD   1,000 mg at 05/15/19 0909  . ziprasidone (GEODON) injection 10 mg  10 mg Intramuscular Q6H PRN Max Sane, MD   10 mg at 05/15/19 1008    Musculoskeletal: Strength & Muscle Tone: decreased Gait & Station: NA Patient leans: N/A  Psychiatric Specialty Exam: Physical Exam  Nursing note and vitals reviewed. Constitutional: She appears well-developed and well-nourished.  HENT:  Head: Normocephalic and atraumatic.  Respiratory: Effort normal.  Neurological: She is alert.    ROS   Blood pressure (!) 132/54, pulse 68, temperature 98.1 F (36.7 C), temperature source Oral, resp. rate 20, height 5\' 1"  (1.549 m), weight 79.4 kg, SpO2 93 %.Body mass index is 33.08 kg/m.  General Appearance: Casual  Eye Contact:  Fair  Speech:  Normal Rate  Volume:  Decreased  Mood:  Euthymic  Affect:  Congruent  Thought Process:  Goal Directed and Descriptions of Associations: Circumstantial  Orientation:  Other:  Oriented to 3 with some assistance.  Thought Content:  Logical  Suicidal Thoughts:  No  Homicidal Thoughts:  No  Memory:  Immediate;   Poor Recent;   Poor Remote;   Poor  Judgement:  Intact  Insight:  Fair  Psychomotor Activity:  Normal  Concentration:  Concentration: Poor and Attention Span: Poor  Recall:  Poor  Fund of Knowledge:  Fair  Language:  Fair  Akathisia:  Negative  Handed:  Right  AIMS (if indicated):     Assets:  Desire for Improvement Resilience  ADL's:  Impaired  Cognition:  Impaired,  Moderate  Sleep:        Treatment Plan Summary: Daily contact with patient to assess and evaluate symptoms and progress in treatment, Medication management and Plan : Patient is seen and examined.  Patient is an 78 year old female with reported past psychiatric history significant of depression who is seen in consultation.  Patient is an 78 year old female with a reported past psychiatric history significant for depression, and clearly a history of vascular dementia.  She was previously treated with Celexa, and it is unclear whether or not that medication has been stopped, but she had been receiving it at least until May of this year.  I will restart the Celexa 20 mg p.o. daily.  Review of the electronic medical record revealed significant evidence of atrophy and small vessel disease.  The patient stated she thinks she is in the hospital because  she had a stroke and a fall.  Clearly she is demented.  She currently has Zyprexa 2.5 mg p.o. nightly as well as Geodon 10 mg  IM every 12 hours as needed agitation.  Her QTC on admission was 489, so the Zyprexa is fine for now.  I will restart the Celexa.  She can be referred to a dementia specialist in the area after discharge.  Diagnosis: #1 vascular dementia and probable delirium secondary to hypoxia, #2 history of major depression  #1 restart Celexa 20 mg p.o. daily. 2.  Continue Zyprexa 2.5 mg p.o. nightly for insomnia and confusion. 3.  Continue Geodon 10 mg IM every 12 hours as needed agitation.  Disposition: No evidence of imminent risk to self or others at present.   Patient does not meet criteria for psychiatric inpatient admission. Supportive therapy provided about ongoing stressors.  Sharma Covert, MD 05/15/2019 1:39 PM

## 2019-05-15 NOTE — Progress Notes (Signed)
Pt request to walk , able to ambulate short distance in front of nurses station and back to room . Small BM x 1 noted. States " I am not confused". COVID still pending at this time. Will continue to monitor

## 2019-05-15 NOTE — Progress Notes (Signed)
Daily Progress Note   Patient Name: Holly Conner       Date: 05/15/2019 DOB: April 14, 1941  Age: 78 y.o. MRN#: [id number] Attending Physician: Max Sane, MD Primary Care Physician: Dion Body, MD Admit Date: 05/11/2019  Reason for Consultation/Follow-up: Establishing goals of care  Subjective: Continued GOC discussion with patient's legal guardian Thereasa Conner. Holly Conner is a long time personal family friend and has recently been granted guardianship due to Holly Conner's cognitive decline.   Holly Conner is hopeful to continue Holly Conner's life using medical interventions in a way that promotes her quality of life, but also keeps her safe and comfortable.  Electronic MOST form was completed via VYNCA with following selections made:  DNR, Limited medical interventions: transfer to hospital, medications, no BIPAP, give IV Fluids as needed, determine use or limitation of antibiotics as indicated, no feeding tube.   Holly Conner would like for patient to d/c to [facility]- and knows that patient would eventually be best served in possibly [place] or [place]. Patient is unable to bathe herself, clothe herself, or perform any personal care.   Length of Stay: 4  Current Medications: Scheduled Meds:  . aspirin EC  81 mg Oral Daily  . atorvastatin  80 mg Oral Daily  . Chlorhexidine Gluconate Cloth  6 each Topical Daily  . darifenacin  7.5 mg Oral Daily  . furosemide  20 mg Intravenous BID  . heparin injection (subcutaneous)  5,000 Units Subcutaneous Q8H  . insulin aspart  0-5 Units Subcutaneous QHS  . insulin aspart  0-9 Units Subcutaneous TID WC  . insulin aspart  4 Units Subcutaneous TID WC  . insulin detemir  5 Units Subcutaneous BID  . metoprolol tartrate  25 mg Oral BID  .  multivitamin with minerals  1 tablet Oral Daily  . OLANZapine zydis  2.5 mg Oral QHS  . pyridOXINE  100 mg Oral Daily  . senna-docusate  2 tablet Oral BID  . tamsulosin  0.4 mg Oral QHS  . vitamin B-12  100 mcg Oral Daily  . vitamin C  1,000 mg Oral Daily    Continuous Infusions: . azithromycin 500 mg (05/15/19 0928)  . ceFEPime (MAXIPIME) IV 2 g (05/14/19 1724)  . dextrose 5 % and 0.45% NaCl      PRN Meds: bisacodyl, bisacodyl, dextrose, loratadine,  ziprasidone Vital Signs: BP (!) 132/54 (BP Location: Left Arm)   Pulse 68   Temp 98.1 F (36.7 C) (Oral)   Resp 20   Ht 5\' 1"  (1.549 m)   Wt 79.4 kg   SpO2 93%   BMI 33.08 kg/m  SpO2: SpO2: 93 % O2 Device: O2 Device: Room Air O2 Flow Rate: O2 Flow Rate (L/min): 2 L/min  Intake/output summary:   Intake/Output Summary (Last 24 hours) at 05/15/2019 1129 Last data filed at 05/15/2019 0500 Gross per 24 hour  Intake 240 ml  Output 1950 ml  Net -1710 ml   LBM: Last BM Date: 05/10/19 Baseline Weight: Weight: 79.4 kg Most recent weight: Weight: 79.4 kg       Palliative Assessment/Data: PPS: 30%     Patient Active Problem List   Diagnosis Date Noted  . History of cardioembolic cerebrovascular accident (CVA)   . Vascular dementia with behavior disturbance ([place])   . Acute on chronic systolic congestive heart failure ([place])   . Goals of care, counseling/discussion   . Advanced care planning/counseling discussion   . Palliative care by specialist   . Acute respiratory failure ([place]) 05/11/2019  . Acute respiratory failure with hypoxia ([place])   . NSTEMI (non-ST elevated myocardial infarction) ([place]) 05/02/2019  . Memory changes 04/05/2019  . Unresponsive 04/04/2019  . Unresponsive episode 04/03/2019  . Hypoglycemia 04/03/2019  . Elevated troponin 04/03/2019  . Weakness generalized 03/28/2019  . Hypertensive urgency 12/18/2018  . Stroke ([place]) 12/19/2015  . Type 2 diabetes mellitus ([place]) 12/19/2015  . HTN (hypertension)  12/19/2015  . GERD (gastroesophageal reflux disease) 12/19/2015  . HLD (hyperlipidemia) 12/19/2015  . CKD (chronic kidney disease), stage IV ([place]) 12/19/2015    Palliative Care Assessment & Plan   Patient Profile:  78 y.o. female  with past medical history of CVA, CHF, dementia, CKD admitted on 05/11/2019 with HCAP. This is a recent readmission for the same problem. She has had several hospital visits and admissions this year. She is in LTC at [facility]. Palliative medicine consulted for [place].   Assessment/Recommendations/Plan   MOST completed  Plan for d/c to [facility]- pt would benefit from being in a memory care unit  Continue zyprexa 2.5 mg QHS- titrate up as needed  Refer for Palliative to follow at facility for continued [place] and symptom management  Goals of Care and Additional Recommendations:  Limitations on Scope of Treatment: No Artificial Feeding  Code Status:  DNR  Prognosis:   Unable to determine  Discharge Planning:  [place] for rehab with Palliative care service follow-up  Care plan was discussed with patient's legal guardian- Thereasa Conner Thank you for allowing the Palliative Medicine Team to assist in the care of this patient.   Time In: 1000 Time Out: 1100 Total Time 60 mins Prolonged Time Billed yes      Greater than 50%  of this time was spent counseling and coordinating care related to the above assessment and plan.  Mariana Kaufman, AGNP-C Palliative Medicine   Please contact Palliative Medicine Team phone at [phone] for questions and concerns.

## 2019-05-15 NOTE — Progress Notes (Signed)
[facility] [place] NAME: Holly Conner    MR#:  [id number]  DATE OF BIRTH:  May 27, 1941  SUBJECTIVE:  CHIEF COMPLAINT:   Chief Complaint  Patient presents with  . Shortness of Breath   Came with SOB, required Bipap. Recent admisison to hospital for similar reasons. She again had episodes of confusion and pulling out her lines this morning, sitter at bedside.  When I saw her she seems comfortable and alert and she wanted to go home  REVIEW OF SYSTEMS:  CONSTITUTIONAL: No fever, fatigue or weakness.  EYES: No blurred or double vision.  EARS, NOSE, AND THROAT: No tinnitus or ear pain.  RESPIRATORY: No cough,have shortness of breath, no wheezing or hemoptysis.  CARDIOVASCULAR: No chest pain, orthopnea, edema.  GASTROINTESTINAL: No nausea, vomiting, diarrhea or abdominal pain.  GENITOURINARY: No dysuria, hematuria.  ENDOCRINE: No polyuria, nocturia,  HEMATOLOGY: No anemia, easy bruising or bleeding SKIN: No rash or lesion. MUSCULOSKELETAL: No joint pain or arthritis.   NEUROLOGIC: No tingling, numbness, weakness.  PSYCHIATRY: No anxiety or depression.   ROS  DRUG ALLERGIES:   Allergies  Allergen Reactions  . Iodinated Diagnostic Agents Shortness Of Breath    Only with nuclear stress test per pt Only with nuclear stress test per pt   . Penicillins Rash    Rash at injection site. Pt states she can take Amoxicillin & Keflex   . Escitalopram   . Lithium   . Erythromycin Nausea Only and Nausea And Vomiting    Other reaction(s): Other (See Comments) dehydration     VITALS:  Blood pressure (!) 132/54, pulse 68, temperature 98.1 F (36.7 C), temperature source Oral, resp. rate 20, height 5\' 1"  (1.549 m), weight 79.4 kg, SpO2 93 %. PHYSICAL EXAMINATION:  GENERAL:  78 y.o.-year-old patient lying in the bed with no acute distress.  EYES: Pupils equal, round, reactive to light and accommodation. No scleral icterus. Extraocular  muscles intact.  HEENT: Head atraumatic, normocephalic. Oropharynx and nasopharynx clear.  NECK:  Supple, no jugular venous distention. No thyroid enlargement, no tenderness.  LUNGS: Normal breath sounds bilaterally, no wheezing, rales,rhonchi or crepitation. No use of accessory muscles of respiration.  CARDIOVASCULAR: S1, S2 normal. No murmurs, rubs, or gallops.  ABDOMEN: Soft, nontender, nondistended. Bowel sounds present. No organomegaly or mass.  EXTREMITIES: No pedal edema, cyanosis, or clubbing.  NEUROLOGIC: Cranial nerves II through XII are intact. Muscle strength 4/5 in all extremities. Sensation intact. Gait not checked.  PSYCHIATRIC: The patient is alert and oriented x 2-3.  SKIN: No obvious rash, lesion, or ulcer.   Physical Exam LABORATORY PANEL:   CBC Recent Labs  Lab 05/15/19 0434  WBC 7.0  HGB 8.2*  HCT 25.0*  PLT 278   ------------------------------------------------------------------------------------------------------------------  Chemistries  Recent Labs  Lab 05/11/19 1857 05/12/19 0513  05/15/19 0434  NA 135 135   < > 139  K 4.9 4.2   < > 3.6  CL 104 105   < > 106  CO2 19* 19*   < > 22  GLUCOSE 296* 354*   < > 195*  BUN 44* 50*   < > 58*  CREATININE 2.89* 3.14*   < > 2.91*  CALCIUM 8.7* 8.6*   < > 8.6*  MG  --  1.7  --   --   AST 27  --   --   --   ALT 20  --   --   --   [facility]  76  --   --   --   BILITOT 1.1  --   --   --    < > = values in this interval not displayed.   ------------------------------------------------------------------------------------------------------------------  Cardiac Enzymes No results for input(s): TROPONINI in the last 168 hours. ------------------------------------------------------------------------------------------------------------------  RADIOLOGY:  No results found.  ASSESSMENT AND PLAN:   Active Problems:   Acute respiratory failure (HCC)   History of cardioembolic cerebrovascular accident (CVA)    Vascular dementia with behavior disturbance (HCC)   Acute on chronic systolic congestive heart failure (HCC)   Goals of care, counseling/discussion   Advanced care planning/counseling discussion   Palliative care by specialist   Acute on chronic respiratory failure with hypoxia [facility])   Multifocal pneumonia  Patient is an 78 year old female with history of diabetes mellitus,chronic kidney disease stage IV and CAD who presented to the emergency room with shortness of breath, cough fever and chills  1.Acute on chronic hypoxic respiratory failure: Initially required BiPAP and stepdown care but now -On room air.  2.  Sepsis: Present on admission -Due to pneumonia, improved now.  3.  Acute on chronic diastolic CHF -Well compensated at this time  4.  HCAPneumonia -IV antibiotic broad spectrum for HCAP - MRSA pCR negative, stopped vanc. - azithromycin for atypical coverage.  5.Diabetes mellitus type 2 -blood sugar somewhat high, will increase the dose of insulin Levemir to 8 units twice daily and meal coverage as 6 unit 3 times daily for better blood sugar control - sliding scale insulin.   6. Chronic kidney disease stage IV -Nephrology following  7. Anemia of chronic disease Hemoglobin 8.2  8.  Agitation: Likely due to acute delirium   Was planned on discharge but then facility needs new COVID testing which will not be available right away so discharge has been canceled   Physical therapy recommends skilled nursing facility.  Social worker is on board coordinating   All the records are reviewed and case discussed with Care Management/Social Workerr. Management plans discussed with the patient, nursing and they are in agreement.  CODE STATUS: Full.  TOTAL TIME TAKING CARE OF THIS PATIENT: 35 minutes.    POSSIBLE D/C IN 1-2 DAYS, DEPENDING ON CLINICAL CONDITION.   Max Sane M.D on 05/15/2019   Between 7am to 6pm - Pager - [phone]  After 6pm go to  [email or web address] - password [email or web address] [facility]  Office  [phone]  CC: Primary care physician; Dion Body, MD  Note: This dictation was prepared with Dragon dictation along with smaller phrase technology. Any transcriptional errors that result from this process are unintentional.

## 2019-05-16 LAB — CBC
HCT: 27.9 % — ABNORMAL LOW (ref 36.0–46.0)
Hemoglobin: 9 g/dL — ABNORMAL LOW (ref 12.0–15.0)
MCH: 29.1 pg (ref 26.0–34.0)
MCHC: 32.3 g/dL (ref 30.0–36.0)
MCV: 90.3 fL (ref 80.0–100.0)
Platelets: 321 10*3/uL (ref 150–400)
RBC: 3.09 MIL/uL — ABNORMAL LOW (ref 3.87–5.11)
RDW: 14.3 % (ref 11.5–15.5)
WBC: 6.2 10*3/uL (ref 4.0–10.5)
nRBC: 0 % (ref 0.0–0.2)

## 2019-05-16 LAB — CULTURE, BLOOD (ROUTINE X 2)
Culture: NO GROWTH
Culture: NO GROWTH
Special Requests: ADEQUATE
Special Requests: ADEQUATE

## 2019-05-16 LAB — GLUCOSE, CAPILLARY: Glucose-Capillary: 281 mg/dL — ABNORMAL HIGH (ref 70–99)

## 2019-05-16 LAB — BASIC METABOLIC PANEL
Anion gap: 11 (ref 5–15)
BUN: 48 mg/dL — ABNORMAL HIGH (ref 8–23)
CO2: 24 mmol/L (ref 22–32)
Calcium: 9 mg/dL (ref 8.9–10.3)
Chloride: 106 mmol/L (ref 98–111)
Creatinine, Ser: 2.74 mg/dL — ABNORMAL HIGH (ref 0.44–1.00)
GFR calc Af Amer: 18 mL/min — ABNORMAL LOW (ref >60)
GFR calc non Af Amer: 16 mL/min — ABNORMAL LOW (ref >60)
Glucose, Bld: 191 mg/dL — ABNORMAL HIGH (ref 70–99)
Potassium: 3.7 mmol/L (ref 3.5–5.1)
Sodium: 141 mmol/L (ref 135–145)

## 2019-05-16 LAB — SARS CORONAVIRUS 2 (TAT 6-24 HRS): SARS Coronavirus 2: NEGATIVE

## 2019-05-16 MED ORDER — INFLUENZA VAC A&B SA ADJ QUAD 0.5 ML IM PRSY
0.5000 mL | PREFILLED_SYRINGE | INTRAMUSCULAR | Status: AC | PRN
  Administered 2019-05-16: 0.5 mL via INTRAMUSCULAR
  Filled 2019-05-16: qty 0.5

## 2019-05-16 MED ORDER — CITALOPRAM HYDROBROMIDE 20 MG PO TABS: 20.0000 mg | ORAL_TABLET | Freq: Every day | ORAL | 0 refills | Status: AC

## 2019-05-16 NOTE — TOC Transition Note (Signed)
Transition of Care [facility]) - CM/SW Discharge Note   Patient Details  Name: Holly Conner MRN: [id number] Date of Birth: 19-Apr-1941  [facility]) CM/SW Contact:  Su Hilt, RN Phone Number: 05/16/2019, 8:53 AM   Clinical Narrative:    Patient to discharge to [facility] to room [place] her Guardian is aware.  The bedside nurse to call report to [facility] and to call EMS for transport   Final next level of care: Skilled Nursing Facility Barriers to Discharge: Continued Medical Work up   Patient Goals and CMS Choice   CMS Medicare.gov Compare Post Acute Care list provided to:: Patient Choice offered to / list presented to : Patient  Discharge Placement                       Discharge Plan and Services                                     Social Determinants of Health (SDOH) Interventions     Readmission Risk Interventions Readmission Risk Prevention Plan 05/12/2019  Transportation Screening Complete  Medication Review [employer]) Referral to Pharmacy  PCP or Specialist appointment within 3-5 days of discharge Complete  HRI or Home Care Consult Complete  SW Recovery Care/Counseling Consult Complete  Palliative Care Screening Not [place] Complete  Some recent data might be hidden

## 2019-05-16 NOTE — Care Management Important Message (Signed)
Important Message  Patient Details  Name: Holly Conner MRN: [id number] Date of Birth: [birth year]   Medicare Important Message Given:  Yes     Juliann Pulse A Shawnta Zimbelman 05/16/2019, 11:13 AM

## 2019-05-16 NOTE — Discharge Instructions (Signed)
Community-Acquired Pneumonia, Adult °Pneumonia is an infection of the lungs. It causes swelling in the airways of the lungs. Mucus and fluid may also build up inside the airways. °One type of pneumonia can happen while a person is in a hospital. A different type can happen when a person is not in a hospital (community-acquired pneumonia).  °What are the causes? ° °This condition is caused by germs (viruses, bacteria, or fungi). Some types of germs can be passed from one person to another. This can happen when you breathe in droplets from the cough or sneeze of an infected person. °What increases the risk? °You are more likely to develop this condition if you: °· Have a long-term (chronic) disease, such as: °? Chronic obstructive pulmonary disease (COPD). °? Asthma. °? Cystic fibrosis. °? Congestive heart failure. °? Diabetes. °? Kidney disease. °· Have HIV. °· Have sickle cell disease. °· Have had your spleen removed. °· Do not take good care of your teeth and mouth (poor dental hygiene). °· Have a medical condition that increases the risk of breathing in droplets from your own mouth and nose. °· Have a weakened body defense system (immune system). °· Are a smoker. °· Travel to areas where the germs that cause this illness are common. °· Are around certain animals or the places they live. °What are the signs or symptoms? °· A dry cough. °· A wet (productive) cough. °· Fever. °· Sweating. °· Chest pain. This often happens when breathing deeply or coughing. °· Fast breathing or trouble breathing. °· Shortness of breath. °· Shaking chills. °· Feeling tired (fatigue). °· Muscle aches. °How is this treated? °Treatment for this condition depends on many things. Most adults can be treated at home. In some cases, treatment must happen in a hospital. Treatment may include: °· Medicines given by mouth or through an IV tube. °· Being given extra oxygen. °· Respiratory therapy. °In rare cases, treatment for very bad pneumonia  may include: °· Using a machine to help you breathe. °· Having a procedure to remove fluid from around your lungs. °Follow these instructions at home: °Medicines °· Take over-the-counter and prescription medicines only as told by your doctor. °? Only take cough medicine if you are losing sleep. °· If you were prescribed an antibiotic medicine, take it as told by your doctor. Do not stop taking the antibiotic even if you start to feel better. °General instructions ° °· Sleep with your head and neck raised (elevated). You can do this by sleeping in a recliner or by putting a few pillows under your head. °· Rest as needed. Get at least 8 hours of sleep each night. °· Drink enough water to keep your pee (urine) pale yellow. °· Eat a healthy diet that includes plenty of vegetables, fruits, whole grains, low-fat dairy products, and lean protein. °· Do not use any products that contain nicotine or tobacco. These include cigarettes, e-cigarettes, and chewing tobacco. If you need help quitting, ask your doctor. °· Keep all follow-up visits as told by your doctor. This is important. °How is this prevented? °A shot (vaccine) can help prevent pneumonia. Shots are often suggested for: °· People older than 80 years of age. °· People older than 80 years of age who: °? Are having cancer treatment. °? Have long-term (chronic) lung disease. °? Have problems with their body's defense system. °You may also prevent pneumonia if you take these actions: °· Get the flu (influenza) shot every year. °· Go to the dentist as   often as told. °· Wash your hands often. If you cannot use soap and water, use hand sanitizer. °Contact a doctor if: °· You have a fever. °· You lose sleep because your cough medicine does not help. °Get help right away if: °· You are short of breath and it gets worse. °· You have more chest pain. °· Your sickness gets worse. This is very serious if: °? You are an older adult. °? Your body's defense system is weak. °· You  cough up blood. °Summary °· Pneumonia is an infection of the lungs. °· Most adults can be treated at home. Some will need treatment in a hospital. °· Drink enough water to keep your pee pale yellow. °· Get at least 8 hours of sleep each night. °This information is not intended to replace advice given to you by your health care provider. Make sure you discuss any questions you have with your health care provider. °Document Released: 01/31/2008 Document Revised: 12/04/2018 Document Reviewed: 04/11/2018 °Elsevier Patient Education © 2020 [employer] ° °

## 2019-05-16 NOTE — Progress Notes (Signed)
EMS called for transport.

## 2019-05-21 ENCOUNTER — Telehealth: Payer: Self-pay | Admitting: Family

## 2019-05-21 ENCOUNTER — Non-Acute Institutional Stay: Payer: Self-pay | Admitting: Nurse Practitioner

## 2019-05-21 ENCOUNTER — Ambulatory Visit: Payer: Medicare Other | Admitting: Family

## 2019-05-21 VITALS — BP 160/64 | HR 73 | Temp 98.6°F | Resp 18 | Wt 169.2 lb

## 2019-05-21 DIAGNOSIS — Z515 Encounter for palliative care: Secondary | ICD-10-CM

## 2019-05-21 NOTE — Telephone Encounter (Signed)
Patient did not show for her [facility] appointment on 05/21/2019. Will attempt to reschedule.

## 2019-05-21 NOTE — Progress Notes (Signed)
[facility] Consult Note Telephone: [phone]  Fax: [phone]  PATIENT NAME: Holly Conner DOB: May 08, 1941 MRN: [id number]  PRIMARY CARE PROVIDER:   Dion Body, MD  REFERRING PROVIDER:  Dr Hodges/[facility] RESPONSIBLE PARTY:   Legal court appointment Guardian; Arelia Sneddon [phone] Health Care power-of-attorney  I was asked by Dr Nyra Capes to see Holly Conner for Palliative care consult for goals of care  RECOMMENDATIONS and PLAN:  1. ACP: DNR; [name] guardian/HCPOA; Treat what is treatable  2. Generalized weakness  secondary to continue with therapy as able. Encourage energy conservation and rest times.  3. Palliative care encounter Palliative medicine team will continue to support patient, patient's family, and medical team. Visit consisted of counseling and education dealing with the complex and emotionally intense issues of symptom management and palliative care in the setting of serious and potentially life-threatening illness   I spent 95 minutes providing this consultation,  from 3:00pm to 4;35pm. More than 50% of the time in this consultation was spent coordinating communication.   HISTORY OF PRESENT ILLNESS:  Holly Conner is a 78 y.o. year old female with multiple medical problems including Stroke, polymyalgia rheumatica, diabetes, gerd, history of hepatitis , arthritis, non stemi myocardial infarction, hypertension, vascular dementia with behavioral disturbances, sleep apnea, chronic kidney disease , hyperlipidemia, depression,  tonsillectomy and adenoidectomy, right total knee replacement, bilateral reduction mammoplasty, roofer ectomy, esophageal dilatation, abdominal hysterectomy, bilateral salpingoophorectomy. She was hospitalized 4/ 22 / 2020 to 4 / 24 / 2020 for hypertensive emergency being out of her benazepril with elevated troponin. Anemia hemoglobin 8.8 while on Heparin  drip. Elevated troponin the absence of chest pain with positive EKG changes and abnormal Baseline likely due to demand ischemia in the setting of uncontrolled hypertension. Chronic diastolic congestive heart failure with recent Echo EF greater than 55% in grade 3 to 4 diastolic dysfunction. Chronic kidney disease trapman at Baseline hospitalized 7 / 30 / 2020 to 8 / 4 / 2020 for weakness with workout significant for embolic CVA started aspirin with Neurology consult, MRI brain done with no clear source of emboli. Altered mental status could be a combination of hospital delirium and use of anesthetic medications during procedure. Acute on chronic kidney disease stage IV with Baseline creatinine 2.13 with GFR less than 30. Hospitalize 8 / 6 / 2022 8 / 10 / 2024 altered mental status with fall secondary to hypoglycemia with underlying dementia requiring dextrose infusion. Psychiatric consult obtained with Seroquel initiated and Haldol for agitation as needed. Diabetes with hypoglycemia secondary to poor oral intake and not taking her insulin properly with underlying cognitive decline. Recurrent Falls recommended discharge to [facility] for ongoing care as concern for not safe to be at home beautiful risk. Hospitalized 9 / 4 / 2020 to 62 / 7 / 2020 for shortness of breath with work of significant for acute hypoxic respiratory failure in the setting of multifocal pneumonia, bilateral pleural effusions with acute on chronic diastolic congestive heart failure with negative covid-19 sting. She was giving antibiotic therapy. She did require one unit to be transfused. She continued on aspirin, Statin and metoprolol for elevated troponin which ACS was ruled out and likely due to demand ischemia. Diabetes hemoglobin A1c 6.0. Chronic kidney disease stage IV. Recommended outpatient neurology follow up for short term memory loss. Hospitalized 9 / 13 / 2020 to 53 / 18 / 2020 for shortness of breathwork of significant  for acute on chronic  hypoxic respiratory failure requiring BiPAP with step down admission, sepsis due to pneumonia which improve. Acute on chronic diastolic congestive heart failure compensated. Pneumonia was treated. Chronic kidney disease stage 4 stable. Anemia of chronic disease with hemoglobin 8.2. Agitation likely due to acute delirium started on a laza peeing, so except for psychiatric recommendation and considered Geodon for agitation if needed at facility. She is a DNR. She did have palliative care consult during hospitalization. Holly Conner continues to reset it short-term rehab at [facility]. She does transfer was fast. She is able to sit up in a wheelchair. She does require assistance for adl's. She feeds herself and her appetite has been fair. At times she is more confused than other days. At present she is sitting in the wheelchair in her room. She appears chronically ill but comfortable. No visitors present. I visited and observe Holly Conner. We talked about purpose or palliative care visit and she was an agreement. We talked about how she is feeling today. Holly Conner endorses that she is having a very good day. That she's been working with therapy and believe that it is going very well. She denied symptoms of pain or shortness of breath. We talked about her appetite. She shared it was good depending on what was being served. She was concerned that sometimes the food that stinks and is not things that she likes. We talked about past medical history those she did have difficulty with that piece of the discussion with cognitive impairment. We talked about recent observations and she shared that she had been very sick but unable to give details. We talked about role of palliative care and plan of care. At present she does appear to be stable. DNR- [place] form into Epic /Vynca for DNR. Therapeutic listening and emotional support provided. I called Holly Conner, her guardian/Health Care  power of attorney medical and financial. Holly Conner and I talked about purpose of palliative care visit. We talked about past medical history, the progression of chronic disease, multiple hospitalizations. We talked about the last time Holly. Rohman was independent as she does live independently at home with assistance. She has been recently charged with a felony for embezzlement as she was accepting money from a gentleman, placing it in an account, then returning the money to him with a money laundering system. Holly Conner talked at length about her cognitive impairment with poor judgment and difficulty with understanding the process being a scam. Holly Conner talked about her personality prior to retirement where she was always all in [place] exercise or dieting and goes to the extremes in attempts to make that happen. We talked about family dynamics as Holly Conner does have a estranged daughter and goes to the Extremes in attempts to make that happen. We talked about family dynamics as Holly Conner does have a estranged daughter with whom she's declined to have a relationship or any responsibility towards Holly Conner during guardianship with court proceedings. Holly Conner endorses that she does have legal guardian with decision-making for Holly Conner. We talked about disease progression. We talked about short-term rehab and what she has been working on with therapy. We talked about her cognitive limitations. We talked about her increasing skill level. We talked about concern of her residing at home independently as that does not appear to be a safe discharge. We talked about Holly Conner require 24-hour caregivers. Holly Conner in the process of filing Medicaid application but it's taken some time to get  financial information as Holly Conner has had multiple bank accounts across the [place]. Holly Conner endorses that she was originally from [place] where she originally met her over 20 years ago and has moved to [place] to be closer to [employer]. We talked about the Medicaid application, getting that turned in in hopes of completion the next few days. We talked about time frame for Medicaid application can take up to 60 to 90 days for approval and that she does own her own home. We talked about once short-term rehab is completed that she would not be able to transition to long-term care if she was self-pay she would need Medicaid in order to do that. If Medicaid is approved then wishes are to transition to skilled long-term care facility. If Medicaid is tonight, she is unable to pay for a room she possibly would have to be discharged home private caregivers. Holly Conner endorses that she lives a few minutes away but does not live with Holly Conner. We talked about safe discharge home. We talked about home health to continue therapy and option of palliative care services at home in addition to palliative care social worker involvement. We talked about cost of private caregivers and her limited financial resources. Holly Conner endorses  her life is very busy and she would not be able to stay with Holly Conner as a 24-hour caregiver. We talked about continuing to inquire about short-term rehab and when that date does appear to be completed. 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 We talked about her cognitive limitations. We talked about medical goals of care. DNR does remain in place. We talked about role of palliative care and plan of care. Discuss that will follow them one week if needed her sooner should she declined. Holly. Kalman Conner in agreement. Holly. Kalman Conner was going to continue to gather the information so she can complete the Medicaid application of most importance. Therapeutic listening an emotional support provided. Contact information provided. questions answered to satisfaction.  Palliative Care was asked to help address goals of care.   CODE STATUS: DNR  PPS: 40% HOSPICE  ELIGIBILITY/DIAGNOSIS: TBD  PAST MEDICAL HISTORY:  Past Medical History:  Diagnosis Date  . Arthritis   . Depression    controlled  . Diabetes mellitus without complication ([place])    managed well;   Marland Kitchen GERD (gastroesophageal reflux disease)   . Hepatitis   . HLD (hyperlipidemia)   . Hypertension    somewhat controlled  . 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 Polymyalgia rheumatica ([place])   . Sleep apnea   . Stroke [facility])     SOCIAL HX:  Social History   Tobacco Use  . Smoking status: Never Smoker  . Smokeless tobacco: Never Used  Substance Use Topics  . Alcohol use: No    Alcohol/week: 0.0 standard drinks    ALLERGIES:  Allergies  Allergen Reactions  . Iodinated Diagnostic Agents Shortness Of Breath    Only with nuclear stress test per pt Only with nuclear stress test per pt   . Penicillins Rash    Rash at injection site. Pt states she can take Amoxicillin & Keflex   . Escitalopram   . Lithium   . Erythromycin Nausea Only and Nausea And Vomiting    Other reaction(s): Other (See Comments) dehydration      PERTINENT MEDICATIONS:  Outpatient Encounter Medications  as of 05/21/2019  Medication Sig  . Ascorbic Acid (VITAMIN C) 1000 MG tablet Take 1,000 mg by mouth daily.  Marland Kitchen aspirin EC 81 MG tablet Take 1 tablet (81 mg total) by mouth daily.  . citalopram (CELEXA) 20 MG tablet Take 1 tablet (20 mg total) by mouth daily.  . insulin aspart (NOVOLOG FLEXPEN) 100 UNIT/ML FlexPen Inject 0-12 Units into the skin See admin instructions. Inject under the skin three times daily with meals according to sliding scale:   200 or below: 0u 201-250: 2u 251-300: 4u 301-350: 6u 351-400: 8u 401-450: 10u 450 or higher: 12u and contact physician  . Insulin Degludec (TRESIBA FLEXTOUCH) 200 UNIT/ML SOPN Inject 10 Units into the skin daily.  Marland Kitchen loratadine (CLARITIN) 10 MG tablet Take 10 mg by mouth daily as needed for allergies.  . metoprolol tartrate (LOPRESSOR) 25  MG tablet Take 1 tablet (25 mg total) by mouth 2 (two) times daily.  . Multiple Vitamin (MULTIVITAMIN WITH MINERALS) TABS tablet Take 1 tablet by mouth daily.  Marland Kitchen OLANZapine zydis (ZYPREXA) 5 MG disintegrating tablet Take 0.5 tablets (2.5 mg total) by mouth at bedtime.  . pyridOXINE (VITAMIN B-6) 100 MG tablet Take 100 mg by mouth daily.  . solifenacin (VESICARE) 10 MG tablet Take 1 tablet (10 mg total) by mouth daily.  . tamsulosin (FLOMAX) 0.4 MG CAPS capsule Take 1 capsule (0.4 mg total) by mouth at bedtime.  . vitamin B-12 (CYANOCOBALAMIN) 100 MCG tablet Take 100 mcg by mouth daily.    No facility-administered encounter medications on file as of 05/21/2019.     PHYSICAL EXAM:   General: obese, debilitated, pleasant female Cardiovascular: regular rate and rhythm Pulmonary: clear ant fields Extremities: no edema, no joint deformities Neurological: Weakness but otherwise nonfocal  Gwendloyn Forsee Ihor Gully, NP

## 2019-05-22 ENCOUNTER — Other Ambulatory Visit: Payer: Self-pay

## 2019-05-28 ENCOUNTER — Other Ambulatory Visit: Payer: Self-pay

## 2019-05-28 ENCOUNTER — Non-Acute Institutional Stay: Payer: Self-pay | Admitting: Adult Health Nurse Practitioner

## 2019-05-28 DIAGNOSIS — Z515 Encounter for palliative care: Secondary | ICD-10-CM

## 2019-05-28 NOTE — Progress Notes (Signed)
[facility] Consult Note Telephone: [phone]  Fax: [phone]  PATIENT NAME: Holly Conner DOB: 1940-10-03 MRN: [id number]  PRIMARY CARE PROVIDER:   Dion Body, MD  REFERRING PROVIDER: Dr Hodges/[facility]  RESPONSIBLE PARTY: Legal court appointment Guardian; Holly Conner [phone] Health Care power-of-attorney    RECOMMENDATIONS and PLAN:  1.  ACP: DNR; [name] guardian/HCPOA; Treat what is treatable.  Will call and update guardian  2.  Weakness.  Patient working with therapy and states that she is feeling stronger.  Staff does report that she will get up and walk but does use her wheelchair for mobility as well.  Staff reports that she does work well with therapy. No reported falls since last visit. Continue therapy as ordered.  Continue to encourage energy conservation and rest periods.     I spent 25 minutes providing this consultation,  from 12:20 to 12:45. More than 50% of the time in this consultation was spent coordinating communication.   HISTORY OF PRESENT ILLNESS:  Holly Conner is a 78 y.o. year old female with multiple medical problems including Stroke, polymyalgia rheumatica, diabetes, gerd, history of hepatitis , arthritis, non stemi myocardial infarction, hypertension, vascular dementia with behavioral disturbances, sleep apnea, chronic kidney disease , hyperlipidemia, depression. Palliative Care was asked to help address goals of care. Patient has no new concerns and no concerns reported by staff.  Appetite is good and weight is stable at 175.  Patient has no new concerns.  No concerns reported by staff.    CODE STATUS: DNR  PPS: 40% HOSPICE ELIGIBILITY/DIAGNOSIS: TBD  PHYSICAL EXAM:   General: patient sitting on side of bed in NAD Cardiovascular: regular rate and rhythm Pulmonary: clear ant fields Abdomen: soft, nontender, + bowel sounds GU: no suprapubic tenderness  Extremities: no edema, no joint deformities Skin: no rashes Neurological: Weakness but otherwise nonfocal  PAST MEDICAL HISTORY:  Past Medical History:  Diagnosis Date  . Arthritis   . Depression    controlled  . Diabetes mellitus without complication ([place])    managed well;   Marland Kitchen GERD (gastroesophageal reflux disease)   . Hepatitis   . HLD (hyperlipidemia)   . Hypertension    somewhat controlled  . 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 Polymyalgia rheumatica ([place])   . Sleep apnea   . Stroke [facility])     SOCIAL HX:  Social History   Tobacco Use  . Smoking status: Never Smoker  . Smokeless tobacco: Never Used  Substance Use Topics  . Alcohol use: No    Alcohol/week: 0.0 standard drinks    ALLERGIES:  Allergies  Allergen Reactions  . Iodinated Diagnostic Agents Shortness Of Breath    Only with nuclear stress test per pt Only with nuclear stress test per pt   . Penicillins Rash    Rash at injection site. Pt states she can take Amoxicillin & Keflex   . Escitalopram   . Lithium   . Erythromycin Nausea Only and Nausea And Vomiting    Other reaction(s): Other (See Comments) dehydration      PERTINENT MEDICATIONS:  Outpatient Encounter Medications as of 05/28/2019  Medication Sig  . Ascorbic Acid (VITAMIN C) 1000 MG tablet Take 1,000 mg by mouth daily.  Marland Kitchen aspirin EC 81 MG tablet Take 1 tablet (81 mg total) by mouth daily.  . citalopram (CELEXA) 20 MG tablet Take 1 tablet (20 mg total) by mouth daily.  Marland Kitchen  insulin aspart (NOVOLOG FLEXPEN) 100 UNIT/ML FlexPen Inject 0-12 Units into the skin See admin instructions. Inject under the skin three times daily with meals according to sliding scale:   200 or below: 0u 201-250: 2u 251-300: 4u 301-350: 6u 351-400: 8u 401-450: 10u 450 or higher: 12u and contact physician  . Insulin Degludec (TRESIBA FLEXTOUCH) 200 UNIT/ML SOPN Inject 10 Units into the skin daily.  Marland Kitchen loratadine (CLARITIN) 10 MG tablet Take 10  mg by mouth daily as needed for allergies.  . metoprolol tartrate (LOPRESSOR) 25 MG tablet Take 1 tablet (25 mg total) by mouth 2 (two) times daily.  . Multiple Vitamin (MULTIVITAMIN WITH MINERALS) TABS tablet Take 1 tablet by mouth daily.  Marland Kitchen OLANZapine zydis (ZYPREXA) 5 MG disintegrating tablet Take 0.5 tablets (2.5 mg total) by mouth at bedtime.  . pyridOXINE (VITAMIN B-6) 100 MG tablet Take 100 mg by mouth daily.  . solifenacin (VESICARE) 10 MG tablet Take 1 tablet (10 mg total) by mouth daily.  . tamsulosin (FLOMAX) 0.4 MG CAPS capsule Take 1 capsule (0.4 mg total) by mouth at bedtime.  . vitamin B-12 (CYANOCOBALAMIN) 100 MCG tablet Take 100 mcg by mouth daily.    No facility-administered encounter medications on file as of 05/28/2019.       Jayla Mackie Jenetta Downer, NP

## 2019-05-30 ENCOUNTER — Ambulatory Visit: Payer: Medicare Other | Admitting: Family

## 2019-06-05 ENCOUNTER — Ambulatory Visit: Payer: Medicare Other | Admitting: Family

## 2019-06-08 ENCOUNTER — Inpatient Hospital Stay
Admission: EM | Admit: 2019-06-08 | Discharge: 2019-06-08 | DRG: 177 | Disposition: A | Discharge status: Other Acute Inpatient Hospital | Payer: Medicare Other | Source: Skilled Nursing Facility | Attending: Emergency Medicine | Admitting: Emergency Medicine

## 2019-06-08 ENCOUNTER — Emergency Department: Payer: Medicare Other

## 2019-06-08 ENCOUNTER — Other Ambulatory Visit: Payer: Self-pay

## 2019-06-08 DIAGNOSIS — Z96651 Presence of right artificial knee joint: Secondary | ICD-10-CM | POA: Diagnosis present

## 2019-06-08 DIAGNOSIS — J9621 Acute and chronic respiratory failure with hypoxia: Secondary | ICD-10-CM | POA: Diagnosis present

## 2019-06-08 DIAGNOSIS — Z515 Encounter for palliative care: Secondary | ICD-10-CM | POA: Diagnosis not present

## 2019-06-08 DIAGNOSIS — E119 Type 2 diabetes mellitus without complications: Secondary | ICD-10-CM | POA: Diagnosis not present

## 2019-06-08 DIAGNOSIS — N184 Chronic kidney disease, stage 4 (severe): Secondary | ICD-10-CM

## 2019-06-08 DIAGNOSIS — I130 Hypertensive heart and chronic kidney disease with heart failure and stage 1 through stage 4 chronic kidney disease, or unspecified chronic kidney disease: Secondary | ICD-10-CM | POA: Diagnosis present

## 2019-06-08 DIAGNOSIS — D631 Anemia in chronic kidney disease: Secondary | ICD-10-CM | POA: Diagnosis present

## 2019-06-08 DIAGNOSIS — Z880 Allergy status to penicillin: Secondary | ICD-10-CM

## 2019-06-08 DIAGNOSIS — Z8249 Family history of ischemic heart disease and other diseases of the circulatory system: Secondary | ICD-10-CM

## 2019-06-08 DIAGNOSIS — R0902 Hypoxemia: Secondary | ICD-10-CM

## 2019-06-08 DIAGNOSIS — Z66 Do not resuscitate: Secondary | ICD-10-CM | POA: Diagnosis present

## 2019-06-08 DIAGNOSIS — Z9089 Acquired absence of other organs: Secondary | ICD-10-CM | POA: Diagnosis not present

## 2019-06-08 DIAGNOSIS — I10 Essential (primary) hypertension: Secondary | ICD-10-CM | POA: Diagnosis not present

## 2019-06-08 DIAGNOSIS — J1289 Other viral pneumonia: Secondary | ICD-10-CM

## 2019-06-08 DIAGNOSIS — M353 Polymyalgia rheumatica: Secondary | ICD-10-CM | POA: Diagnosis present

## 2019-06-08 DIAGNOSIS — G4733 Obstructive sleep apnea (adult) (pediatric): Secondary | ICD-10-CM | POA: Diagnosis present

## 2019-06-08 DIAGNOSIS — Z90722 Acquired absence of ovaries, bilateral: Secondary | ICD-10-CM | POA: Diagnosis not present

## 2019-06-08 DIAGNOSIS — F0150 Vascular dementia without behavioral disturbance: Secondary | ICD-10-CM | POA: Diagnosis present

## 2019-06-08 DIAGNOSIS — Z91041 Radiographic dye allergy status: Secondary | ICD-10-CM

## 2019-06-08 DIAGNOSIS — I252 Old myocardial infarction: Secondary | ICD-10-CM | POA: Diagnosis not present

## 2019-06-08 DIAGNOSIS — I5022 Chronic systolic (congestive) heart failure: Secondary | ICD-10-CM | POA: Diagnosis present

## 2019-06-08 DIAGNOSIS — Z79899 Other long term (current) drug therapy: Secondary | ICD-10-CM

## 2019-06-08 DIAGNOSIS — U071 COVID-19: Principal | ICD-10-CM

## 2019-06-08 DIAGNOSIS — Z888 Allergy status to other drugs, medicaments and biological substances status: Secondary | ICD-10-CM

## 2019-06-08 DIAGNOSIS — Z9079 Acquired absence of other genital organ(s): Secondary | ICD-10-CM | POA: Diagnosis not present

## 2019-06-08 DIAGNOSIS — Z881 Allergy status to other antibiotic agents status: Secondary | ICD-10-CM

## 2019-06-08 DIAGNOSIS — J1282 Pneumonia due to coronavirus disease 2019: Secondary | ICD-10-CM

## 2019-06-08 DIAGNOSIS — Z7982 Long term (current) use of aspirin: Secondary | ICD-10-CM | POA: Diagnosis not present

## 2019-06-08 DIAGNOSIS — F329 Major depressive disorder, single episode, unspecified: Secondary | ICD-10-CM | POA: Diagnosis present

## 2019-06-08 DIAGNOSIS — Z794 Long term (current) use of insulin: Secondary | ICD-10-CM

## 2019-06-08 DIAGNOSIS — J9601 Acute respiratory failure with hypoxia: Secondary | ICD-10-CM | POA: Diagnosis not present

## 2019-06-08 DIAGNOSIS — R0602 Shortness of breath: Secondary | ICD-10-CM | POA: Diagnosis present

## 2019-06-08 DIAGNOSIS — E1122 Type 2 diabetes mellitus with diabetic chronic kidney disease: Secondary | ICD-10-CM | POA: Diagnosis present

## 2019-06-08 DIAGNOSIS — I69319 Unspecified symptoms and signs involving cognitive functions following cerebral infarction: Secondary | ICD-10-CM | POA: Diagnosis not present

## 2019-06-08 DIAGNOSIS — Z90710 Acquired absence of both cervix and uterus: Secondary | ICD-10-CM

## 2019-06-08 LAB — CBC WITH DIFFERENTIAL/PLATELET
Abs Immature Granulocytes: 0.02 10*3/uL (ref 0.00–0.07)
Basophils Absolute: 0 10*3/uL (ref 0.0–0.1)
Basophils Relative: 1 %
Eosinophils Absolute: 0 10*3/uL (ref 0.0–0.5)
Eosinophils Relative: 0 %
HCT: 25.5 % — ABNORMAL LOW (ref 36.0–46.0)
Hemoglobin: 8.1 g/dL — ABNORMAL LOW (ref 12.0–15.0)
Immature Granulocytes: 0 %
Lymphocytes Relative: 7 %
Lymphs Abs: 0.4 10*3/uL — ABNORMAL LOW (ref 0.7–4.0)
MCH: 28.6 pg (ref 26.0–34.0)
MCHC: 31.8 g/dL (ref 30.0–36.0)
MCV: 90.1 fL (ref 80.0–100.0)
Monocytes Absolute: 0.3 10*3/uL (ref 0.1–1.0)
Monocytes Relative: 5 %
Neutro Abs: 5.2 10*3/uL (ref 1.7–7.7)
Neutrophils Relative %: 87 %
Platelets: 356 10*3/uL (ref 150–400)
RBC: 2.83 MIL/uL — ABNORMAL LOW (ref 3.87–5.11)
RDW: 14.9 % (ref 11.5–15.5)
WBC: 6 10*3/uL (ref 4.0–10.5)
nRBC: 0 % (ref 0.0–0.2)

## 2019-06-08 LAB — COMPREHENSIVE METABOLIC PANEL
ALT: 20 U/L (ref 0–44)
AST: 31 U/L (ref 15–41)
Albumin: 2.5 g/dL — ABNORMAL LOW (ref 3.5–5.0)
Alkaline Phosphatase: 63 U/L (ref 38–126)
Anion gap: 12 (ref 5–15)
BUN: 52 mg/dL — ABNORMAL HIGH (ref 8–23)
CO2: 18 mmol/L — ABNORMAL LOW (ref 22–32)
Calcium: 8.3 mg/dL — ABNORMAL LOW (ref 8.9–10.3)
Chloride: 105 mmol/L (ref 98–111)
Creatinine, Ser: 2.67 mg/dL — ABNORMAL HIGH (ref 0.44–1.00)
GFR calc Af Amer: 19 mL/min — ABNORMAL LOW (ref >60)
GFR calc non Af Amer: 16 mL/min — ABNORMAL LOW (ref >60)
Glucose, Bld: 117 mg/dL — ABNORMAL HIGH (ref 70–99)
Potassium: 4.6 mmol/L (ref 3.5–5.1)
Sodium: 135 mmol/L (ref 135–145)
Total Bilirubin: 0.6 mg/dL (ref 0.3–1.2)
Total Protein: 5.9 g/dL — ABNORMAL LOW (ref 6.5–8.1)

## 2019-06-08 LAB — PROCALCITONIN: Procalcitonin: 6.86 ng/mL

## 2019-06-08 LAB — FIBRIN DERIVATIVES D-DIMER (ARMC ONLY): Fibrin derivatives D-dimer (ARMC): 2146.7 ng/mL (FEU) — ABNORMAL HIGH (ref 0.00–499.00)

## 2019-06-08 LAB — C-REACTIVE PROTEIN: CRP: 8.5 mg/dL — ABNORMAL HIGH (ref <1.0)

## 2019-06-08 LAB — SARS CORONAVIRUS 2 BY RT PCR (HOSPITAL ORDER, PERFORMED IN ~~LOC~~ HOSPITAL LAB): SARS Coronavirus 2: POSITIVE — AB

## 2019-06-08 MED ORDER — SODIUM CHLORIDE 0.9 % IV SOLN
200.0000 mg | Freq: Once | INTRAVENOUS | Status: AC
  Administered 2019-06-08: 200 mg via INTRAVENOUS
  Filled 2019-06-08: qty 40

## 2019-06-08 MED ORDER — DEXAMETHASONE SODIUM PHOSPHATE 10 MG/ML IJ SOLN
8.0000 mg | INTRAMUSCULAR | Status: DC
  Administered 2019-06-08: 8 mg via INTRAVENOUS
  Filled 2019-06-08: qty 1

## 2019-06-08 MED ORDER — COLCHICINE 0.6 MG PO TABS
0.6000 mg | ORAL_TABLET | Freq: Two times a day (BID) | ORAL | Status: DC
  Filled 2019-06-08: qty 1

## 2019-06-08 MED ORDER — VANCOMYCIN HCL 500 MG IV SOLR: 500.0000 mg | INTRAVENOUS | Status: DC

## 2019-06-08 MED ORDER — DARIFENACIN HYDROBROMIDE ER 15 MG PO TB24: 15.0000 mg | ORAL_TABLET | Freq: Every day | ORAL | Status: DC

## 2019-06-08 MED ORDER — INSULIN ASPART 100 UNIT/ML ~~LOC~~ SOLN: 0.0000 [IU] | SUBCUTANEOUS | Status: DC

## 2019-06-08 MED ORDER — TAMSULOSIN HCL 0.4 MG PO CAPS: 0.4000 mg | ORAL_CAPSULE | Freq: Every day | ORAL | Status: DC

## 2019-06-08 MED ORDER — INSULIN GLARGINE 100 UNIT/ML ~~LOC~~ SOLN: 5.0000 [IU] | Freq: Every day | SUBCUTANEOUS | Status: DC

## 2019-06-08 MED ORDER — SODIUM CHLORIDE 0.9 % IV SOLN: 1.0000 g | Freq: Once | INTRAVENOUS | Status: DC

## 2019-06-08 MED ORDER — SODIUM CHLORIDE 0.9 % IV SOLN: 100.0000 mg | INTRAVENOUS | Status: DC

## 2019-06-08 MED ORDER — ADULT MULTIVITAMIN W/MINERALS CH: 1.0000 | ORAL_TABLET | Freq: Every day | ORAL | Status: DC

## 2019-06-08 MED ORDER — VITAMIN C 500 MG PO TABS
500.0000 mg | ORAL_TABLET | Freq: Every day | ORAL | Status: DC
  Administered 2019-06-08: 500 mg via ORAL
  Filled 2019-06-08: qty 1

## 2019-06-08 MED ORDER — LORATADINE 10 MG PO TABS: 10.0000 mg | ORAL_TABLET | Freq: Every day | ORAL | Status: DC | PRN

## 2019-06-08 MED ORDER — POLYETHYLENE GLYCOL 3350 17 G PO PACK: 17.0000 g | PACK | Freq: Every day | ORAL | Status: DC | PRN

## 2019-06-08 MED ORDER — METHYLPREDNISOLONE SODIUM SUCC 125 MG IJ SOLR: 60.0000 mg | Freq: Two times a day (BID) | INTRAMUSCULAR | Status: DC

## 2019-06-08 MED ORDER — ENOXAPARIN SODIUM 40 MG/0.4ML ~~LOC~~ SOLN: 30.0000 mg | SUBCUTANEOUS | Status: DC

## 2019-06-08 MED ORDER — ACETAMINOPHEN 325 MG PO TABS: 650.0000 mg | ORAL_TABLET | Freq: Four times a day (QID) | ORAL | Status: DC | PRN

## 2019-06-08 MED ORDER — DEXAMETHASONE SODIUM PHOSPHATE 10 MG/ML IJ SOLN
10.0000 mg | Freq: Once | INTRAMUSCULAR | Status: AC
  Administered 2019-06-08: 14:00:00 10 mg via INTRAVENOUS
  Filled 2019-06-08: qty 1

## 2019-06-08 MED ORDER — METOPROLOL TARTRATE 25 MG PO TABS: 25.0000 mg | ORAL_TABLET | Freq: Two times a day (BID) | ORAL | Status: DC

## 2019-06-08 MED ORDER — SODIUM CHLORIDE 0.9 % IV SOLN
2.0000 g | INTRAVENOUS | Status: DC
  Administered 2019-06-08: 2 g via INTRAVENOUS
  Filled 2019-06-08: qty 2

## 2019-06-08 MED ORDER — ONDANSETRON HCL 4 MG PO TABS: 4.0000 mg | ORAL_TABLET | Freq: Four times a day (QID) | ORAL | Status: DC | PRN

## 2019-06-08 MED ORDER — VITAMIN B-12 100 MCG PO TABS: 100.0000 ug | ORAL_TABLET | Freq: Every day | ORAL | Status: DC

## 2019-06-08 MED ORDER — SODIUM CHLORIDE 0.9 % IV SOLN: 2.0000 g | Freq: Once | INTRAVENOUS | Status: DC

## 2019-06-08 MED ORDER — VANCOMYCIN HCL 10 G IV SOLR
2000.0000 mg | Freq: Once | INTRAVENOUS | Status: AC
  Administered 2019-06-08: 2000 mg via INTRAVENOUS
  Filled 2019-06-08: qty 2000

## 2019-06-08 MED ORDER — ONDANSETRON HCL 4 MG/2ML IJ SOLN: 4.0000 mg | Freq: Four times a day (QID) | INTRAMUSCULAR | Status: DC | PRN

## 2019-06-08 MED ORDER — ZINC SULFATE 220 (50 ZN) MG PO CAPS
220.0000 mg | ORAL_CAPSULE | Freq: Every day | ORAL | Status: DC
  Administered 2019-06-08: 220 mg via ORAL
  Filled 2019-06-08: qty 1

## 2019-06-08 MED ORDER — ASPIRIN EC 81 MG PO TBEC: 81.0000 mg | DELAYED_RELEASE_TABLET | Freq: Every day | ORAL | Status: DC

## 2019-06-08 MED ORDER — VITAMIN B-6 50 MG PO TABS: 100.0000 mg | ORAL_TABLET | Freq: Every day | ORAL | Status: DC

## 2019-06-08 NOTE — ED Notes (Signed)
Called Phil from [facility] to Initiate transfer to [place]

## 2019-06-08 NOTE — ED Notes (Signed)
Pt has left ED with [facility]

## 2019-06-08 NOTE — ED Provider Notes (Signed)
Notified by Dr. Brett Albino that apparently there is not staffing available in [place] to admit this patient.  Discussed with [facility] who notes that [facility] will have availability at 7 PM, have contacted them to discuss transfer   Lavonia Drafts, MD 06/08/19 1700

## 2019-06-08 NOTE — ED Triage Notes (Signed)
Patient from [facility] c./o increased sob. Patient recently dx positive for Covis. Arrives to ed via ems on NRB sating 100%. Patient P/W/D.

## 2019-06-08 NOTE — Consult Note (Signed)
PHARMACY -  BRIEF ANTIBIOTIC NOTE   Pharmacy has received consult(s) for PNA from an ED provider.  The patient's profile has been reviewed for ht/wt/allergies/indication/available labs.    One time order(s) placed for cefepime and vancomycin   Further antibiotics/pharmacy consults should be ordered by admitting physician if indicated.                       Thank you, Oswald Hillock 06/08/2019  3:13 PM

## 2019-06-08 NOTE — ED Notes (Signed)
[facility] in room reports pt does not have IV. This RN places new IV and finds previous IV in bathroom on floor.

## 2019-06-08 NOTE — ED Provider Notes (Signed)
[facility] Emergency Department Provider Note  ____________________________________________   First MD Initiated Contact with Patient 06/08/19 1304     (approximate)  I have reviewed the triage vital signs and the nursing notes.   HISTORY  Chief Complaint Shortness of Breath    HPI Holly Conner is a 78 y.o. female with past medical history as below here with shortness of breath.  The patient was recently diagnosed with coronavirus.  She was positive on 10/6.  Since then, she has had reportedly increased cough and shortness of breath.  She has  become more hypoxic with increased work of breathing over the course of the day today.  Per report, they called EMS to evaluate, and she was found to be satting in the 72s.  She was placed on a nonrebreather and brought here.  On my assessment, she states she feels "horrible."  Remainder of history limited due to history of dementia.       Past Medical History:  Diagnosis Date   Arthritis    Depression    controlled   Diabetes mellitus without complication ([place])    managed well;    GERD (gastroesophageal reflux disease)    Hepatitis    HLD (hyperlipidemia)    Hypertension    somewhat controlled   Kidney failure    4th stage; but bloodwork is stable;    Polymyalgia rheumatica ([place])    Sleep apnea    Stroke [facility])     Patient Active Problem List   Diagnosis Date Noted   COVID-19 06/08/2019   Acute on chronic respiratory failure with hypoxia (HCC)    Multifocal pneumonia    History of cardioembolic cerebrovascular accident (CVA)    Vascular dementia with behavior disturbance (HCC)    Acute on chronic systolic congestive heart failure ([place])    Goals of care, counseling/discussion    Advanced care planning/counseling discussion    Palliative care by specialist    Acute respiratory failure ([place]) 05/11/2019   Acute respiratory failure with hypoxia ([place])    NSTEMI (non-ST  elevated myocardial infarction) ([place]) 05/02/2019   Memory changes 04/05/2019   Unresponsive 04/04/2019   Unresponsive episode 04/03/2019   Hypoglycemia 04/03/2019   Elevated troponin 04/03/2019   Weakness generalized 03/28/2019   Hypertensive urgency 12/18/2018   Stroke ([place]) 12/19/2015   Type 2 diabetes mellitus ([place]) 12/19/2015   HTN (hypertension) 12/19/2015   GERD (gastroesophageal reflux disease) 12/19/2015   HLD (hyperlipidemia) 12/19/2015   CKD (chronic kidney disease), stage IV ([place]) 12/19/2015    Past Surgical History:  Procedure Laterality Date   ABDOMINAL HYSTERECTOMY     BILATERAL SALPINGOOPHORECTOMY     ESOPHAGEAL DILATION     OOPHORECTOMY     REDUCTION MAMMAPLASTY Bilateral 2003   REPLACEMENT TOTAL KNEE Right    TEE WITHOUT CARDIOVERSION N/A 03/31/2019   Procedure: TRANSESOPHAGEAL ECHOCARDIOGRAM (TEE);  Surgeon: Corey Skains, MD;  Location: [facility] ORS;  Service: Cardiovascular;  Laterality: N/A;   TONSILLECTOMY AND ADENOIDECTOMY      Prior to Admission medications   Medication Sig Start Date End Date Taking? Authorizing Provider  Ascorbic Acid (VITAMIN C) 1000 MG tablet Take 1,000 mg by mouth daily.   Yes [provider]  aspirin EC 81 MG tablet Take 1 tablet (81 mg total) by mouth daily. 04/01/19 03/31/20 Yes Vaughan Basta, MD  insulin aspart (NOVOLOG FLEXPEN) 100 UNIT/ML FlexPen Inject 0-12 Units into the skin See admin instructions. Inject under the skin three times daily with meals  according to sliding scale:   200 or below: 0u 201-250: 2u 251-300: 4u 301-350: 6u 351-400: 8u 401-450: 10u 450 or higher: 12u and contact physician   Yes [provider]  Insulin Degludec (TRESIBA FLEXTOUCH) 200 UNIT/ML SOPN Inject 10 Units into the skin daily. Patient taking differently: Inject 10 Units into the skin at bedtime.  05/05/19  Yes Mody, Ulice Bold, MD  loratadine (CLARITIN) 10 MG tablet Take 10 mg by mouth daily as needed for  allergies.   Yes [provider]  metoprolol tartrate (LOPRESSOR) 25 MG tablet Take 1 tablet (25 mg total) by mouth 2 (two) times daily. 05/05/19  Yes Bettey Costa, MD  Multiple Vitamin (MULTIVITAMIN WITH MINERALS) TABS tablet Take 1 tablet by mouth daily. 04/01/19  Yes Vaughan Basta, MD  pyridOXINE (VITAMIN B-6) 100 MG tablet Take 100 mg by mouth daily.   Yes [provider]  solifenacin (VESICARE) 10 MG tablet Take 1 tablet (10 mg total) by mouth daily. 10/11/18  Yes Stoioff, Ronda Fairly, MD  tamsulosin (FLOMAX) 0.4 MG CAPS capsule Take 1 capsule (0.4 mg total) by mouth at bedtime. 04/01/19  Yes Vaughan Basta, MD  vitamin B-12 (CYANOCOBALAMIN) 100 MCG tablet Take 100 mcg by mouth daily.    Yes [provider]  citalopram (CELEXA) 20 MG tablet Take 1 tablet (20 mg total) by mouth daily. Patient not taking: Reported on 06/08/2019 05/16/19   Max Sane, MD  OLANZapine zydis (ZYPREXA) 5 MG disintegrating tablet Take 0.5 tablets (2.5 mg total) by mouth at bedtime. Patient not taking: Reported on 06/08/2019 05/15/19   Max Sane, MD    Allergies Iodinated diagnostic agents, Penicillins, Escitalopram, Lithium, and Erythromycin  Family History  Problem Relation Age of Onset   CAD Brother    Kidney cancer Mother    Breast cancer Cousin     Social History Social History   Tobacco Use   Smoking status: Never Smoker   Smokeless tobacco: Never Used  Substance Use Topics   Alcohol use: No    Alcohol/week: 0.0 standard drinks   Drug use: No    Review of Systems  Review of Systems  Unable to perform ROS: Dementia  Constitutional: Positive for fatigue.  Respiratory: Positive for cough and shortness of breath.      ____________________________________________  PHYSICAL EXAM:      VITAL SIGNS: ED Triage Vitals  Enc Vitals Group     BP 06/08/19 1300 (!) 121/40     Pulse Rate 06/08/19 1300 62     Resp 06/08/19 1300 (!) 33     Temp 06/08/19 1300  98.6 F (37 C)     Temp Source 06/08/19 1300 Oral     SpO2 06/08/19 1259 100 %     Weight 06/08/19 1301 173 lb (78.5 kg)     Height 06/08/19 1301 5\' 1"  (1.549 m)     Head Circumference --      Peak Flow --      Pain Score 06/08/19 1300 0     Pain Loc --      Pain Edu? --      Excl. in [place]? --      Physical Exam Vitals signs and nursing note reviewed.  Constitutional:      General: She is not in acute distress.    Appearance: She is well-developed.  HENT:     Head: Normocephalic and atraumatic.  Eyes:     Conjunctiva/sclera: Conjunctivae normal.  Neck:     Musculoskeletal: Neck supple.  Cardiovascular:     Rate and Rhythm: Regular rhythm. Tachycardia present.     Heart sounds: Normal heart sounds. No murmur. No friction rub.  Pulmonary:     Effort: Pulmonary effort is normal. Tachypnea present. No respiratory distress.     Breath sounds: Examination of the right-upper field reveals rales. Examination of the left-upper field reveals rales. Examination of the right-middle field reveals rales. Examination of the left-middle field reveals rales. Examination of the right-lower field reveals rales. Examination of the left-lower field reveals rales. Decreased breath sounds, rhonchi and rales present.  Abdominal:     General: There is no distension.     Palpations: Abdomen is soft.     Tenderness: There is no abdominal tenderness.  Skin:    General: Skin is warm.     Capillary Refill: Capillary refill takes less than 2 seconds.  Neurological:     Mental Status: She is alert and oriented to person, place, and time.     Motor: No abnormal muscle tone.       ____________________________________________   LABS (all labs ordered are listed, but only abnormal results are displayed)  Labs Reviewed  CBC WITH DIFFERENTIAL/PLATELET - Abnormal; Notable for the following components:      Result Value   RBC 2.83 (*)    Hemoglobin 8.1 (*)    HCT 25.5 (*)    Lymphs Abs 0.4 (*)    All  other components within normal limits  COMPREHENSIVE METABOLIC PANEL - Abnormal; Notable for the following components:   CO2 18 (*)    Glucose, Bld 117 (*)    BUN 52 (*)    Creatinine, Ser 2.67 (*)    Calcium 8.3 (*)    Total Protein 5.9 (*)    Albumin 2.5 (*)    GFR calc non Af Amer 16 (*)    GFR calc Af Amer 19 (*)    All other components within normal limits  FIBRIN DERIVATIVES D-DIMER ([facility] ONLY) - Abnormal; Notable for the following components:   Fibrin derivatives D-dimer [facility]) 2,146.70 (*)    All other components within normal limits  CULTURE, BLOOD (ROUTINE X 2)  CULTURE, BLOOD (ROUTINE X 2)  MRSA PCR SCREENING  PROCALCITONIN  C-REACTIVE PROTEIN    ____________________________________________  EKG: Sinus bradycardia, ventricular rate 58.  QRS 140, QTc 444.  No acute ST or T-segment elevations or depressions. ________________________________________  RADIOLOGY All imaging, including plain films, CT scans, and ultrasounds, independently reviewed by me, and interpretations confirmed via formal radiology reads.  ED MD interpretation:   Chest x-ray: Multifocal pneumonia  Official radiology report(s): Dg Chest Portable 1 View  Result Date: 06/08/2019 CLINICAL DATA:  Worsening shortness of breath in a COVID-19 positive patient. EXAM: PORTABLE CHEST 1 VIEW COMPARISON:  Single-view of the chest 05/11/2019 and 05/04/2019. FINDINGS: The patient has extensive bilateral airspace disease which is worse on left. Aeration in the upper lung zones has mildly improved since the most recent exam. There is cardiomegaly. No pneumothorax or pleural fluid. Atherosclerosis. IMPRESSION: Extensive bilateral airspace disease is worse on the left. Aeration in the upper lung zones is somewhat improved since the most recent exam. Cardiomegaly. Atherosclerosis. Electronically Signed   By: Inge Rise M.D.   On: 06/08/2019 14:01     ____________________________________________  PROCEDURES   Procedure(s) performed (including Critical Care):  .Critical Care Performed by: Duffy Bruce, MD Authorized by: Duffy Bruce, MD   Critical care provider statement:    Critical care time (minutes):  35  Critical care time was exclusive of:  Separately billable procedures and treating other patients and teaching time   Critical care was necessary to treat or prevent imminent or life-threatening deterioration of the following conditions:  Circulatory failure, cardiac failure and sepsis   Critical care was time spent personally by me on the following activities:  Development of treatment plan with patient or surrogate, discussions with consultants, evaluation of patient's response to treatment, examination of patient, obtaining history from patient or surrogate, ordering and performing treatments and interventions, ordering and review of laboratory studies, ordering and review of radiographic studies, pulse oximetry, re-evaluation of patient's condition and review of old charts   I assumed direction of critical care for this patient from another provider in my specialty: no      ____________________________________________  INITIAL IMPRESSION / MDM / [place] / ED COURSE  As part of my medical decision making, I reviewed the following data within the [place] was evaluated in Emergency Department on 06/08/2019 for the symptoms described in the history of present illness. She was evaluated in the context of the global COVID-19 pandemic, which necessitated consideration that the patient might be at risk for infection with the SARS-CoV-2 virus that causes COVID-19. Institutional protocols and algorithms that pertain to the evaluation of patients at risk for COVID-19 are in a state of rapid change based on information released by regulatory bodies including the CDC and  federal and state organizations. These policies and algorithms were followed during the patient's care in the ED.  Some ED evaluations and interventions may be delayed as a result of limited staffing during the pandemic.*      Medical Decision Making: 78 year old female here with acute on chronic hypoxic respiratory failure, likely secondary to COVID-19.  Positive test obtained at her facility.She is hypoxic here, on nonrebreather.  Work of breathing improved.  [facility] full, will admit.  Procalcitonin pending.  Procalcitonin positive.  Broad-spectrum antibiotics started.  Admit to ICU.  ____________________________________________  FINAL CLINICAL IMPRESSION(S) / ED DIAGNOSES  Final diagnoses:  Pneumonia due to COVID-19 virus  Hypoxia     MEDICATIONS GIVEN DURING THIS VISIT:  Medications  vancomycin (VANCOCIN) 2,000 mg in sodium chloride 0.9 % 500 mL IVPB (2,000 mg Intravenous New Bag/Given 06/08/19 1536)  ceFEPIme (MAXIPIME) 2 g in sodium chloride 0.9 % 100 mL IVPB (has no administration in time range)  dexamethasone (DECADRON) injection 10 mg (10 mg Intravenous Given 06/08/19 1344)     ED Discharge Orders    None       Note:  This document was prepared using Dragon voice recognition software and may include unintentional dictation errors.   Duffy Bruce, MD 06/08/19 1556

## 2019-06-08 NOTE — Progress Notes (Signed)
Patient was positive for COVID-19 on 10/6 at [facility]. Patient needs to be admitted for acute hypoxic respiratory failure. [facility] is full. I tried to admit this patient to our stepdown unit, but I was informed that there are no ICU beds available. I then tried to admit her to telemetry, but was informed that they don't have the staffing to take her on the telemetry floor. I have asked the ED physician to transfer to [facility] if possible.  Hyman Bible, MD

## 2019-06-08 NOTE — H&P (Signed)
[place] [facility] [place] NAME: Holly Conner    MR#:  [id number]  DATE OF BIRTH:  [birth year]  DATE OF ADMISSION:  06/08/2019  PRIMARY CARE PHYSICIAN: Dion Body, MD   REQUESTING/REFERRING PHYSICIAN: Duffy Bruce, MD  CHIEF COMPLAINT:   Chief Complaint  Patient presents with   Shortness of Breath    HISTORY OF PRESENT ILLNESS:  Holly Conner  is a 78 y.o. female with a known history of hypertension, hyperlipidemia, type 2 diabetes, history of stroke, depression, OSA, polymyalgia rheumatica who presented to the ED from [place] with shortness of breath.  Patient was recently diagnosed with COVID on 10/6.  She states that she was initially feeling okay, but then developed shortness of breath a couple of days ago.  She endorses dry cough.  No fevers or chills.  No chest pain.  No abdominal pain, nausea, vomiting, diarrhea.  In the ED, oxygen saturations were 60-70s on room air, so she was placed on 8 L nonrebreather.  Respiratory rate was in the 30s.  Labs were significant for creatinine 2.67, hemoglobin 8.1, procalcitonin 6.86.  Chest x-ray with stents of bilateral airspace disease worse on the left.  She was given vancomycin, cefepime, and Decadron.  Hospitalists were called for admission.  PAST MEDICAL HISTORY:   Past Medical History:  Diagnosis Date   Arthritis    Depression    controlled   Diabetes mellitus without complication ([place])    managed well;    GERD (gastroesophageal reflux disease)    Hepatitis    HLD (hyperlipidemia)    Hypertension    somewhat controlled   Kidney failure    4th stage; but bloodwork is stable;    Polymyalgia rheumatica (HCC)    Sleep apnea    Stroke [facility])     PAST SURGICAL HISTORY:   Past Surgical History:  Procedure Laterality Date   ABDOMINAL HYSTERECTOMY     BILATERAL SALPINGOOPHORECTOMY     ESOPHAGEAL DILATION     OOPHORECTOMY     REDUCTION  MAMMAPLASTY Bilateral 2003   REPLACEMENT TOTAL KNEE Right    TEE WITHOUT CARDIOVERSION N/A 03/31/2019   Procedure: TRANSESOPHAGEAL ECHOCARDIOGRAM (TEE);  Surgeon: Corey Skains, MD;  Location: [facility] ORS;  Service: Cardiovascular;  Laterality: N/A;   TONSILLECTOMY AND ADENOIDECTOMY      SOCIAL HISTORY:   Social History   Tobacco Use   Smoking status: Never Smoker   Smokeless tobacco: Never Used  Substance Use Topics   Alcohol use: No    Alcohol/week: 0.0 standard drinks    FAMILY HISTORY:   Family History  Problem Relation Age of Onset   CAD Brother    Kidney cancer Mother    Breast cancer Cousin     DRUG ALLERGIES:   Allergies  Allergen Reactions   Iodinated Diagnostic Agents Shortness Of Breath    Only with nuclear stress test per pt Only with nuclear stress test per pt    Penicillins Rash    Rash at injection site. Pt states she can take Amoxicillin & Keflex    Escitalopram    Lithium    Erythromycin Nausea Only and Nausea And Vomiting    Other reaction(s): Other (See Comments) dehydration     REVIEW OF SYSTEMS:   Review of Systems  Constitutional: Negative for chills and fever.  HENT: Negative for congestion and sore throat.   Eyes: Negative for blurred vision and double vision.  Respiratory: Positive for cough and shortness  of breath. Negative for sputum production.   Cardiovascular: Negative for chest pain and palpitations.  Gastrointestinal: Negative for abdominal pain, nausea and vomiting.  Genitourinary: Negative for dysuria and urgency.  Musculoskeletal: Positive for myalgias. Negative for neck pain.  Neurological: Negative for dizziness and headaches.  Psychiatric/Behavioral: Negative for depression. The patient is not nervous/anxious.     MEDICATIONS AT HOME:   Prior to Admission medications   Medication Sig Start Date End Date Taking? Authorizing Provider  Ascorbic Acid (VITAMIN C) 1000 MG tablet Take 1,000 mg by mouth  daily.    [provider]  aspirin EC 81 MG tablet Take 1 tablet (81 mg total) by mouth daily. 04/01/19 03/31/20  Vaughan Basta, MD  citalopram (CELEXA) 20 MG tablet Take 1 tablet (20 mg total) by mouth daily. 05/16/19   Max Sane, MD  insulin aspart (NOVOLOG FLEXPEN) 100 UNIT/ML FlexPen Inject 0-12 Units into the skin See admin instructions. Inject under the skin three times daily with meals according to sliding scale:   200 or below: 0u 201-250: 2u 251-300: 4u 301-350: 6u 351-400: 8u 401-450: 10u 450 or higher: 12u and contact physician    [provider]  Insulin Degludec (TRESIBA FLEXTOUCH) 200 UNIT/ML SOPN Inject 10 Units into the skin daily. 05/05/19   Bettey Costa, MD  loratadine (CLARITIN) 10 MG tablet Take 10 mg by mouth daily as needed for allergies.    [provider]  metoprolol tartrate (LOPRESSOR) 25 MG tablet Take 1 tablet (25 mg total) by mouth 2 (two) times daily. 05/05/19   Bettey Costa, MD  Multiple Vitamin (MULTIVITAMIN WITH MINERALS) TABS tablet Take 1 tablet by mouth daily. 04/01/19   Vaughan Basta, MD  OLANZapine zydis (ZYPREXA) 5 MG disintegrating tablet Take 0.5 tablets (2.5 mg total) by mouth at bedtime. 05/15/19   Max Sane, MD  pyridOXINE (VITAMIN B-6) 100 MG tablet Take 100 mg by mouth daily.    [provider]  solifenacin (VESICARE) 10 MG tablet Take 1 tablet (10 mg total) by mouth daily. 10/11/18   Stoioff, Ronda Fairly, MD  tamsulosin (FLOMAX) 0.4 MG CAPS capsule Take 1 capsule (0.4 mg total) by mouth at bedtime. 04/01/19   Vaughan Basta, MD  vitamin B-12 (CYANOCOBALAMIN) 100 MCG tablet Take 100 mcg by mouth daily.     [provider]      VITAL SIGNS:  Blood pressure (!) 136/58, pulse 62, temperature 98.6 F (37 C), temperature source Oral, resp. rate (!) 27, height 5\' 1"  (1.549 m), weight 78.5 kg, SpO2 100 %.  PHYSICAL EXAMINATION:  Physical Exam  GENERAL:  78 y.o.-year-old patient lying in the bed  with no acute distress.  EYES: Pupils equal, round, reactive to light and accommodation. No scleral icterus. Extraocular muscles intact.  HEENT: Head atraumatic, normocephalic. Oropharynx and nasopharynx clear.  NECK:  Supple, no jugular venous distention. No thyroid enlargement, no tenderness.  LUNGS: + Diminished breath sounds throughout all lung fields.  Nonrebreather in place.  Mildly increased work of breathing. CARDIOVASCULAR: RRR, S1, S2 normal. No murmurs, rubs, or gallops.  ABDOMEN: Soft, nontender, nondistended. Bowel sounds present. No organomegaly or mass.  EXTREMITIES: No pedal edema, cyanosis, or clubbing.  NEUROLOGIC: Cranial nerves II through XII are intact. + Global weakness.  Sensation intact. Gait not checked.  PSYCHIATRIC: The patient is alert and oriented x 3.  SKIN: No obvious rash, lesion, or ulcer.   LABORATORY PANEL:   CBC Recent Labs  Lab 06/08/19 1307  WBC 6.0  HGB 8.1*  HCT 25.5*  PLT 356   ------------------------------------------------------------------------------------------------------------------  Chemistries  Recent Labs  Lab 06/08/19 1307  NA 135  K 4.6  CL 105  CO2 18*  GLUCOSE 117*  BUN 52*  CREATININE 2.67*  CALCIUM 8.3*  AST 31  ALT 20  ALKPHOS 63  BILITOT 0.6   ------------------------------------------------------------------------------------------------------------------  Cardiac Enzymes No results for input(s): TROPONINI in the last 168 hours. ------------------------------------------------------------------------------------------------------------------  RADIOLOGY:  Dg Chest Portable 1 View  Result Date: 06/08/2019 CLINICAL DATA:  Worsening shortness of breath in a COVID-19 positive patient. EXAM: PORTABLE CHEST 1 VIEW COMPARISON:  Single-view of the chest 05/11/2019 and 05/04/2019. FINDINGS: The patient has extensive bilateral airspace disease which is worse on left. Aeration in the upper lung zones has mildly  improved since the most recent exam. There is cardiomegaly. No pneumothorax or pleural fluid. Atherosclerosis. IMPRESSION: Extensive bilateral airspace disease is worse on the left. Aeration in the upper lung zones is somewhat improved since the most recent exam. Cardiomegaly. Atherosclerosis. Electronically Signed   By: Inge Rise M.D.   On: 06/08/2019 14:01      IMPRESSION AND PLAN:   Acute hypoxic respiratory failure secondary to COVID-19 infection- diagnosed at [facility] on 10/6. Currently on 8 L nonrebreather. [facility] is currently full and there are no ICU beds here at [facility]. -Discussed with pulmonology- they will see her -Start solumedrol 60mg  IV bid -Continue empiric antibiotics for now -Patient is DNR but agreeable to BiPAP if needed -Blood cultures pending  Hypertension- normotensive in the ED -Continue home metoprolol  Type 2 diabetes- blood sugar normal in the ED -Lantus and SSI  CKD IV- creatinine at baseline. -Monitor -Avoid nephrotoxic agents  Anemia of chronic disease- hemoglobin low but at baseline. No active bleeding. -Monitor  All the records are reviewed and case discussed with ED provider. Management plans discussed with the patient, family and they are in agreement.  CODE STATUS: DNR but agreeable to BiPAP  TOTAL TIME TAKING CARE OF THIS PATIENT: 45 minutes.    Berna Spare Ladean Steinmeyer M.D on 06/08/2019 at 3:16 PM  Between 7am to 6pm - Pager - [phone]  After 6pm go to [email or web address] - Proofreader  [employer] [facility] [place] [facility]  Office  [phone]  CC: Primary care physician; Dion Body, MD   Note: This dictation was prepared with Dragon dictation along with smaller phrase technology. Any transcriptional errors that result from this process are unintentional.

## 2019-06-08 NOTE — Progress Notes (Signed)
Family Meeting Note  Advance Directive:yes  Today a meeting took place with the Patient.  Patient is able to participate.  The following clinical team members were present during this meeting:MD  The following were discussed:Patient's diagnosis: COVID-19 infection, Patient's progosis: Unable to determine and Goals for treatment: DNR but agreeable to BiPAP.  Additional follow-up to be provided: prn  Time spent during discussion:20 minutes  Evette Doffing, MD

## 2019-06-08 NOTE — ED Notes (Signed)
Patient again out of bed without assist pulled iv hep lock out. Patient incontinent of bladder. Peri care given linen changed patient back in bed and instructed to use call light for assist or needs.

## 2019-06-08 NOTE — Progress Notes (Signed)
Patient with progressive resp distress CXR b/l Infiltrates +hypoxia  Patient is DNR/DNI  Recommend  1. Steroids 2.remdisivir 3.zinc 4.vit c  Supposedly Patient to be transferred to [facility]

## 2019-06-08 NOTE — ED Provider Notes (Signed)
Spoke with [facility] team, unfortunately they will not admit the patient without a test results denoting the positive COVID.  We called [place] [facility], unfortunately there are tests are done by the health department so we do not have documentation of this positive coronavirus test hence we will have to repeat the test here.   Lavonia Drafts, MD 06/08/19 [id number]

## 2019-06-08 NOTE — ED Notes (Signed)
Patient out of bed on her own, walked to bathroom to have bowel movement, patient very tychipnic and c./o of increased shortnes of breath. Patient instructed to use call lihght and not clin=mb out of bed without assist.

## 2019-06-08 NOTE — Progress Notes (Addendum)
Pharmacy Antibiotic Note  Holly Conner is a 78 y.o. female admitted on 06/08/2019 with pneumonia.  Pharmacy has been consulted for Vancomycin, Cefepime dosing.  Plan: Cefepime 2 gm IV Q24H to start on 10/11 @ ~ 1600.  Vancomycin 2 gm IV X 1 given in ED on 10/11 @ 1536. Vancomycin 500 mg IV Q48H ordered to start 10/13 @ 0400. Vd = 39.3 L  Ke = 0.015 hr-1 T1/2 = 46.4 hrs AUC = 426 Vanc trough = 12.3   Height: 5\' 1"  (154.9 cm) Weight: 173 lb (78.5 kg) IBW/kg (Calculated) : 47.8  Temp (24hrs), Avg:98.6 F (37 C), Min:98.6 F (37 C), Max:98.6 F (37 C)  Recent Labs  Lab 06/08/19 1307  WBC 6.0  CREATININE 2.67*    Estimated Creatinine Clearance: 15.9 mL/min (A) (by C-G formula based on SCr of 2.67 mg/dL (H)).    Allergies  Allergen Reactions  . Iodinated Diagnostic Agents Shortness Of Breath    Only with nuclear stress test per pt Only with nuclear stress test per pt   . Penicillins Rash    Rash at injection site. Pt states she can take Amoxicillin & Keflex   . Escitalopram   . Lithium   . Erythromycin Nausea Only and Nausea And Vomiting    Other reaction(s): Other (See Comments) dehydration     Antimicrobials this admission:   >>    >>   Dose adjustments this admission:   Microbiology results:  BCx:   UCx:    Sputum:    MRSA PCR:   Thank you for allowing pharmacy to be a part of this patient's care.  Tarius Stangelo D 06/08/2019 4:18 PM

## 2019-06-08 NOTE — ED Notes (Signed)
Daughter called and updated on status.

## 2019-06-09 ENCOUNTER — Inpatient Hospital Stay (HOSPITAL_COMMUNITY)
Admission: AD | Admit: 2019-06-09 | Discharge: 2019-06-29 | DRG: 177 | Disposition: E | Payer: Medicare Other | Source: Other Acute Inpatient Hospital | Attending: Internal Medicine | Admitting: Internal Medicine

## 2019-06-09 ENCOUNTER — Inpatient Hospital Stay (HOSPITAL_COMMUNITY): Payer: Medicare Other

## 2019-06-09 ENCOUNTER — Encounter (HOSPITAL_COMMUNITY): Payer: Self-pay | Admitting: Family Medicine

## 2019-06-09 DIAGNOSIS — T380X5A Adverse effect of glucocorticoids and synthetic analogues, initial encounter: Secondary | ICD-10-CM | POA: Diagnosis present

## 2019-06-09 DIAGNOSIS — I2510 Atherosclerotic heart disease of native coronary artery without angina pectoris: Secondary | ICD-10-CM | POA: Diagnosis present

## 2019-06-09 DIAGNOSIS — E119 Type 2 diabetes mellitus without complications: Secondary | ICD-10-CM

## 2019-06-09 DIAGNOSIS — J1282 Pneumonia due to coronavirus disease 2019: Secondary | ICD-10-CM | POA: Diagnosis present

## 2019-06-09 DIAGNOSIS — U071 COVID-19: Secondary | ICD-10-CM | POA: Diagnosis present

## 2019-06-09 DIAGNOSIS — D631 Anemia in chronic kidney disease: Secondary | ICD-10-CM | POA: Diagnosis present

## 2019-06-09 DIAGNOSIS — J1289 Other viral pneumonia: Secondary | ICD-10-CM | POA: Diagnosis present

## 2019-06-09 DIAGNOSIS — I447 Left bundle-branch block, unspecified: Secondary | ICD-10-CM | POA: Diagnosis present

## 2019-06-09 DIAGNOSIS — Z8673 Personal history of transient ischemic attack (TIA), and cerebral infarction without residual deficits: Secondary | ICD-10-CM

## 2019-06-09 DIAGNOSIS — N179 Acute kidney failure, unspecified: Secondary | ICD-10-CM | POA: Diagnosis present

## 2019-06-09 DIAGNOSIS — Z515 Encounter for palliative care: Secondary | ICD-10-CM | POA: Diagnosis not present

## 2019-06-09 DIAGNOSIS — E1165 Type 2 diabetes mellitus with hyperglycemia: Secondary | ICD-10-CM | POA: Diagnosis present

## 2019-06-09 DIAGNOSIS — Z8249 Family history of ischemic heart disease and other diseases of the circulatory system: Secondary | ICD-10-CM

## 2019-06-09 DIAGNOSIS — E875 Hyperkalemia: Secondary | ICD-10-CM | POA: Diagnosis present

## 2019-06-09 DIAGNOSIS — Z794 Long term (current) use of insulin: Secondary | ICD-10-CM | POA: Diagnosis not present

## 2019-06-09 DIAGNOSIS — J9601 Acute respiratory failure with hypoxia: Secondary | ICD-10-CM | POA: Diagnosis present

## 2019-06-09 DIAGNOSIS — F0151 Vascular dementia with behavioral disturbance: Secondary | ICD-10-CM | POA: Diagnosis not present

## 2019-06-09 DIAGNOSIS — I129 Hypertensive chronic kidney disease with stage 1 through stage 4 chronic kidney disease, or unspecified chronic kidney disease: Secondary | ICD-10-CM | POA: Diagnosis present

## 2019-06-09 DIAGNOSIS — Z96651 Presence of right artificial knee joint: Secondary | ICD-10-CM | POA: Diagnosis present

## 2019-06-09 DIAGNOSIS — I10 Essential (primary) hypertension: Secondary | ICD-10-CM | POA: Diagnosis not present

## 2019-06-09 DIAGNOSIS — E1122 Type 2 diabetes mellitus with diabetic chronic kidney disease: Secondary | ICD-10-CM | POA: Diagnosis present

## 2019-06-09 DIAGNOSIS — Z6834 Body mass index (BMI) 34.0-34.9, adult: Secondary | ICD-10-CM | POA: Diagnosis not present

## 2019-06-09 DIAGNOSIS — F329 Major depressive disorder, single episode, unspecified: Secondary | ICD-10-CM | POA: Diagnosis present

## 2019-06-09 DIAGNOSIS — Z7982 Long term (current) use of aspirin: Secondary | ICD-10-CM

## 2019-06-09 DIAGNOSIS — Z8051 Family history of malignant neoplasm of kidney: Secondary | ICD-10-CM

## 2019-06-09 DIAGNOSIS — N184 Chronic kidney disease, stage 4 (severe): Secondary | ICD-10-CM | POA: Diagnosis present

## 2019-06-09 DIAGNOSIS — Z66 Do not resuscitate: Secondary | ICD-10-CM | POA: Diagnosis present

## 2019-06-09 DIAGNOSIS — F01518 Vascular dementia, unspecified severity, with other behavioral disturbance: Secondary | ICD-10-CM | POA: Diagnosis present

## 2019-06-09 DIAGNOSIS — R0600 Dyspnea, unspecified: Secondary | ICD-10-CM

## 2019-06-09 LAB — ABO/RH
ABO/RH(D): A POS
ABO/RH(D): A POS

## 2019-06-09 LAB — GLUCOSE, CAPILLARY
Glucose-Capillary: 368 mg/dL — ABNORMAL HIGH (ref 70–99)
Glucose-Capillary: 320 mg/dL — ABNORMAL HIGH (ref 70–99)
Glucose-Capillary: 364 mg/dL — ABNORMAL HIGH (ref 70–99)
Glucose-Capillary: 333 mg/dL — ABNORMAL HIGH (ref 70–99)

## 2019-06-09 MED ORDER — METRONIDAZOLE IN NACL 5-0.79 MG/ML-% IV SOLN
500.0000 mg | Freq: Three times a day (TID) | INTRAVENOUS | Status: DC
  Administered 2019-06-10 – 2019-06-12 (×7): 500 mg via INTRAVENOUS
  Filled 2019-06-09 (×9): qty 100

## 2019-06-09 MED ORDER — HYDROCODONE-ACETAMINOPHEN 5-325 MG PO TABS: 1.0000 | ORAL_TABLET | ORAL | Status: DC | PRN

## 2019-06-09 MED ORDER — TAMSULOSIN HCL 0.4 MG PO CAPS
0.4000 mg | ORAL_CAPSULE | Freq: Every day | ORAL | Status: DC
  Administered 2019-06-09: 0.4 mg via ORAL
  Filled 2019-06-09: qty 1

## 2019-06-09 MED ORDER — INSULIN GLARGINE 100 UNIT/ML ~~LOC~~ SOLN
7.0000 [IU] | Freq: Every day | SUBCUTANEOUS | Status: DC
  Administered 2019-06-09: 02:00:00 7 [IU] via SUBCUTANEOUS
  Filled 2019-06-09 (×2): qty 0.07

## 2019-06-09 MED ORDER — LORAZEPAM 2 MG/ML IJ SOLN
0.5000 mg | INTRAMUSCULAR | Status: DC | PRN
  Administered 2019-06-09: 16:00:00 1 mg via INTRAVENOUS
  Administered 2019-06-09: 12:00:00 0.5 mg via INTRAVENOUS
  Administered 2019-06-09 – 2019-06-10 (×2): 1 mg via INTRAVENOUS
  Filled 2019-06-09 (×4): qty 1

## 2019-06-09 MED ORDER — SODIUM CHLORIDE 0.9% FLUSH
3.0000 mL | Freq: Two times a day (BID) | INTRAVENOUS | Status: DC
  Administered 2019-06-09 – 2019-06-11 (×6): 3 mL via INTRAVENOUS

## 2019-06-09 MED ORDER — SODIUM CHLORIDE 0.9% FLUSH: 3.0000 mL | INTRAVENOUS | Status: DC | PRN

## 2019-06-09 MED ORDER — ALBUTEROL SULFATE HFA 108 (90 BASE) MCG/ACT IN AERS
2.0000 | INHALATION_SPRAY | RESPIRATORY_TRACT | Status: DC | PRN
  Administered 2019-06-09 (×4): 2 via RESPIRATORY_TRACT
  Filled 2019-06-09: qty 6.7

## 2019-06-09 MED ORDER — HEPARIN SODIUM (PORCINE) 5000 UNIT/ML IJ SOLN
5000.0000 [IU] | Freq: Three times a day (TID) | INTRAMUSCULAR | Status: DC
  Administered 2019-06-09 – 2019-06-12 (×10): 5000 [IU] via SUBCUTANEOUS
  Filled 2019-06-09 (×10): qty 1

## 2019-06-09 MED ORDER — MAGNESIUM SULFATE 2 GM/50ML IV SOLN
2.0000 g | Freq: Once | INTRAVENOUS | Status: AC
  Administered 2019-06-09: 2 g via INTRAVENOUS
  Filled 2019-06-09: qty 50

## 2019-06-09 MED ORDER — HALOPERIDOL LACTATE 5 MG/ML IJ SOLN
1.0000 mg | Freq: Four times a day (QID) | INTRAMUSCULAR | Status: DC | PRN
  Administered 2019-06-09: 1 mg via INTRAVENOUS
  Filled 2019-06-09: qty 1

## 2019-06-09 MED ORDER — SODIUM CHLORIDE 0.9 % IV SOLN
250.0000 mL | INTRAVENOUS | Status: DC | PRN
  Administered 2019-06-09: 02:00:00 250 mL via INTRAVENOUS

## 2019-06-09 MED ORDER — SODIUM CHLORIDE 0.9 % IV SOLN
2.0000 g | INTRAVENOUS | Status: DC
  Administered 2019-06-09 – 2019-06-12 (×4): 2 g via INTRAVENOUS
  Filled 2019-06-09 (×4): qty 20

## 2019-06-09 MED ORDER — INSULIN ASPART 100 UNIT/ML ~~LOC~~ SOLN
0.0000 [IU] | Freq: Every day | SUBCUTANEOUS | Status: DC
  Administered 2019-06-09: 22:00:00 3 [IU] via SUBCUTANEOUS

## 2019-06-09 MED ORDER — SODIUM CHLORIDE 0.9 % IV SOLN
100.0000 mg | INTRAVENOUS | Status: DC
  Administered 2019-06-09 – 2019-06-11 (×3): 100 mg via INTRAVENOUS
  Filled 2019-06-09 (×4): qty 20

## 2019-06-09 MED ORDER — SODIUM CHLORIDE 0.9 % IV SOLN
500.0000 mg | Freq: Every day | INTRAVENOUS | Status: DC
  Administered 2019-06-10: 500 mg via INTRAVENOUS
  Filled 2019-06-09: qty 500

## 2019-06-09 MED ORDER — INSULIN ASPART 100 UNIT/ML ~~LOC~~ SOLN
0.0000 [IU] | Freq: Three times a day (TID) | SUBCUTANEOUS | Status: DC
  Administered 2019-06-09: 09:00:00 7 [IU] via SUBCUTANEOUS
  Administered 2019-06-09: 9 [IU] via SUBCUTANEOUS

## 2019-06-09 MED ORDER — METOPROLOL TARTRATE 25 MG PO TABS
25.0000 mg | ORAL_TABLET | Freq: Two times a day (BID) | ORAL | Status: DC
  Administered 2019-06-09: 25 mg via ORAL
  Filled 2019-06-09: qty 1

## 2019-06-09 MED ORDER — INSULIN GLARGINE 100 UNIT/ML ~~LOC~~ SOLN
14.0000 [IU] | Freq: Every day | SUBCUTANEOUS | Status: DC
  Administered 2019-06-09 – 2019-06-11 (×2): 14 [IU] via SUBCUTANEOUS
  Filled 2019-06-09 (×3): qty 0.14

## 2019-06-09 MED ORDER — DEXAMETHASONE SODIUM PHOSPHATE 10 MG/ML IJ SOLN
6.0000 mg | INTRAMUSCULAR | Status: DC
  Administered 2019-06-09 – 2019-06-11 (×3): 6 mg via INTRAVENOUS
  Filled 2019-06-09 (×3): qty 1

## 2019-06-09 MED ORDER — ACETAMINOPHEN 325 MG PO TABS: 650.0000 mg | ORAL_TABLET | Freq: Four times a day (QID) | ORAL | Status: DC | PRN

## 2019-06-09 MED ORDER — INSULIN ASPART 100 UNIT/ML ~~LOC~~ SOLN
0.0000 [IU] | Freq: Every day | SUBCUTANEOUS | Status: DC
  Administered 2019-06-09: 02:00:00 5 [IU] via SUBCUTANEOUS

## 2019-06-09 MED ORDER — MORPHINE SULFATE (PF) 2 MG/ML IV SOLN
2.0000 mg | INTRAVENOUS | Status: DC | PRN
  Administered 2019-06-09 – 2019-06-10 (×3): 2 mg via INTRAVENOUS
  Filled 2019-06-09 (×4): qty 1

## 2019-06-09 MED ORDER — INSULIN ASPART 100 UNIT/ML ~~LOC~~ SOLN
0.0000 [IU] | Freq: Three times a day (TID) | SUBCUTANEOUS | Status: DC
  Administered 2019-06-09: 17:00:00 15 [IU] via SUBCUTANEOUS
  Administered 2019-06-10: 7 [IU] via SUBCUTANEOUS
  Administered 2019-06-10: 19:00:00 4 [IU] via SUBCUTANEOUS
  Administered 2019-06-10: 7 [IU] via SUBCUTANEOUS
  Administered 2019-06-11 (×2): 4 [IU] via SUBCUTANEOUS
  Administered 2019-06-11: 14:00:00 7 [IU] via SUBCUTANEOUS

## 2019-06-09 MED ORDER — ASPIRIN EC 81 MG PO TBEC
81.0000 mg | DELAYED_RELEASE_TABLET | Freq: Every day | ORAL | Status: DC
  Administered 2019-06-09: 81 mg via ORAL
  Filled 2019-06-09: qty 1

## 2019-06-09 MED ORDER — SODIUM CHLORIDE 0.9 % IV SOLN
500.0000 mg | INTRAVENOUS | Status: AC
  Administered 2019-06-09: 500 mg via INTRAVENOUS
  Filled 2019-06-09: qty 500

## 2019-06-09 MED ORDER — DARIFENACIN HYDROBROMIDE ER 15 MG PO TB24
15.0000 mg | ORAL_TABLET | Freq: Every day | ORAL | Status: DC
  Administered 2019-06-09: 12:00:00 15 mg via ORAL
  Filled 2019-06-09 (×3): qty 1

## 2019-06-09 MED ORDER — SODIUM CHLORIDE 0.9% FLUSH
3.0000 mL | Freq: Two times a day (BID) | INTRAVENOUS | Status: DC
  Administered 2019-06-09: 3 mL via INTRAVENOUS

## 2019-06-09 MED ORDER — HALOPERIDOL LACTATE 5 MG/ML IJ SOLN
3.0000 mg | Freq: Four times a day (QID) | INTRAMUSCULAR | Status: DC | PRN
  Administered 2019-06-12: 3 mg via INTRAVENOUS
  Filled 2019-06-09: qty 1

## 2019-06-09 MED ORDER — GLYCOPYRROLATE 0.2 MG/ML IJ SOLN
0.1000 mg | Freq: Once | INTRAMUSCULAR | Status: AC
  Administered 2019-06-09: 0.1 mg via INTRAVENOUS
  Filled 2019-06-09: qty 1

## 2019-06-09 MED ORDER — ONDANSETRON HCL 4 MG/2ML IJ SOLN: 4.0000 mg | Freq: Four times a day (QID) | INTRAMUSCULAR | Status: DC | PRN

## 2019-06-09 MED ORDER — ONDANSETRON HCL 4 MG PO TABS: 4.0000 mg | ORAL_TABLET | Freq: Four times a day (QID) | ORAL | Status: DC | PRN

## 2019-06-09 NOTE — Progress Notes (Signed)
Pharmacy Antibiotic Note  Holly Conner is a 78 y.o. female admitted on 06/24/2019 with COVID 19.  Pharmacy has been consulted for Remdesivir dosing.  CXR shows extensive bilateral airspace disease is worse on the left Pt requiring supplemental oxygen (15L NRB) ALT 20  Plan: Remdesivir 200mg  IV given at [facility] then 100mg  IV daily x 4 days Will f/u ALT and pt's clinical condition    Temp (24hrs), Avg:98.4 F (36.9 C), Min:97.8 F (36.6 C), Max:98.7 F (37.1 C)  Recent Labs  Lab 06/08/19 1307  WBC 6.0  CREATININE 2.67*    Estimated Creatinine Clearance: 15.9 mL/min (A) (by C-G formula based on SCr of 2.67 mg/dL (H)).    Allergies  Allergen Reactions  . Iodinated Diagnostic Agents Shortness Of Breath    Only with nuclear stress test per pt Only with nuclear stress test per pt   . Penicillins Rash    Rash at injection site. Pt states she can take Amoxicillin & Keflex   . Escitalopram   . Lithium   . Erythromycin Nausea Only and Nausea And Vomiting    Other reaction(s): Other (See Comments) dehydration     Antimicrobials this admission: 10/11 Cefepime/Vanc x 1 10/12 Rocephin/Azith >> 10/16 10/11 Remdesivir  >> 10/15  Microbiology results: 10/11 BCx [facility]):   Sputum:   10/11 MRSA PCR [facility]):   Thank you for allowing pharmacy to be a part of this patient's care.  Sherlon Handing, PharmD, BCPS CGV Clinical pharmacist phone [phone] 06/11/2019 12:54 AM

## 2019-06-09 NOTE — Progress Notes (Signed)
   Vital Signs MEWS/VS Documentation      05/30/2019 1600 06/01/2019 1621 06/18/2019 1900 06/17/2019 1915   MEWS Score:  2  1  1  2    MEWS Score Color:  yellow  Green  Green  Yellow   Resp:  30  (!) 22  -  (!) 30   Pulse:  88  93  86  88   BP:  136/55  (!) 119/104  -  (!) 136/55   Temp:  97.4  (!) 97.2 F (36.2 C)  -  (!) 97.4 F (36.3 C)   O2 Device:  Nasal Cannula  Nasal Cannula  HFNC  Nasal Cannula;HFNC   O2 Flow Rate (L/min):  5 L/min  -  2 L/min  5 L/min     Tachypneic, restless, increased work of breathing and B crackles upon assessment. Sitter at bedside, B mitts in place.  DR. Olevia Bowens and charge RN informed, will increase vitals frequency per protocol.     Melvyn Neth 06/04/2019,8:20 PM

## 2019-06-09 NOTE — H&P (Addendum)
History and Physical    Holly Conner [id number] DOB: [birth year] DOA: 06/08/2019  PCP: Dion Body, MD   Patient coming from: SNF   Chief Complaint: Respiratory distress, hypoxia   HPI: Holly Conner is a 78 y.o. female with medical history significant for history of CVA, coronary artery disease, dementia, chronic kidney disease stage IV, insulin-dependent diabetes mellitus, and hypertension, now presenting to the emergency department from her SNF where she was noted to be dyspneic and hypoxic.  There were a cluster of residents with COVID-19 at the patient's SNF, she was tested by the health department on 06/03/2019 and found to be positive, noted by SNF personnel to have increased work of breathing and hypoxia on 06/08/2019, reportedly saturating in the 30s, and EMS was called for transport to the hospital.  Patient denies any abdominal pain, nausea, vomiting, diarrhea, or chest pain.  She also denies fevers or chills.  [facility] ED Course: Upon arrival to the ED, patient is found to be afebrile, saturating 100% on nonrebreather, tachypneic up into the 30s, and with stable blood pressure.  EKG features a sinus rhythm with LBBB.  Chest x-ray is notable for extensive bilateral airspace disease.  Chemistry panel features a creatinine of 2.67, similar to priors.  CBC notable for a stable normocytic anemia.  CRP is elevated to 8.5, pro calcitonin 6.86, and fibrin derivatives 2147.  Blood cultures were collected in the emergency department and the patient was treated with vancomycin, cefepime, Decadron, remdesivir, and colchicine.  Review of Systems:  All other systems reviewed and apart from HPI, are negative.  Past Medical History:  Diagnosis Date   Arthritis    Depression    controlled   Diabetes mellitus without complication ([place])    managed well;    GERD (gastroesophageal reflux disease)    Hepatitis    HLD (hyperlipidemia)    Hypertension     somewhat controlled   Kidney failure    4th stage; but bloodwork is stable;    Polymyalgia rheumatica (HCC)    Sleep apnea    Stroke [facility])     Past Surgical History:  Procedure Laterality Date   ABDOMINAL HYSTERECTOMY     BILATERAL SALPINGOOPHORECTOMY     ESOPHAGEAL DILATION     OOPHORECTOMY     REDUCTION MAMMAPLASTY Bilateral 2003   REPLACEMENT TOTAL KNEE Right    TEE WITHOUT CARDIOVERSION N/A 03/31/2019   Procedure: TRANSESOPHAGEAL ECHOCARDIOGRAM (TEE);  Surgeon: Corey Skains, MD;  Location: [facility] ORS;  Service: Cardiovascular;  Laterality: N/A;   TONSILLECTOMY AND ADENOIDECTOMY       reports that she has never smoked. She has never used smokeless tobacco. She reports that she does not drink alcohol or use drugs.  Allergies  Allergen Reactions   Iodinated Diagnostic Agents Shortness Of Breath    Only with nuclear stress test per pt Only with nuclear stress test per pt    Penicillins Rash    Rash at injection site. Pt states she can take Amoxicillin & Keflex    Escitalopram    Lithium    Erythromycin Nausea Only and Nausea And Vomiting    Other reaction(s): Other (See Comments) dehydration     Family History  Problem Relation Age of Onset   CAD Brother    Kidney cancer Mother    Breast cancer Cousin      Prior to Admission medications   Medication Sig Start Date End Date Taking? Authorizing Provider  Ascorbic Acid (VITAMIN C) 1000 MG  tablet Take 1,000 mg by mouth daily.    [provider]  aspirin EC 81 MG tablet Take 1 tablet (81 mg total) by mouth daily. 04/01/19 03/31/20  Vaughan Basta, MD  citalopram (CELEXA) 20 MG tablet Take 1 tablet (20 mg total) by mouth daily. Patient not taking: Reported on 06/08/2019 05/16/19   Max Sane, MD  insulin aspart (NOVOLOG FLEXPEN) 100 UNIT/ML FlexPen Inject 0-12 Units into the skin See admin instructions. Inject under the skin three times daily with meals according to sliding scale:     200 or below: 0u 201-250: 2u 251-300: 4u 301-350: 6u 351-400: 8u 401-450: 10u 450 or higher: 12u and contact physician    [provider]  Insulin Degludec (TRESIBA FLEXTOUCH) 200 UNIT/ML SOPN Inject 10 Units into the skin daily. Patient taking differently: Inject 10 Units into the skin at bedtime.  05/05/19   Bettey Costa, MD  loratadine (CLARITIN) 10 MG tablet Take 10 mg by mouth daily as needed for allergies.    [provider]  metoprolol tartrate (LOPRESSOR) 25 MG tablet Take 1 tablet (25 mg total) by mouth 2 (two) times daily. 05/05/19   Bettey Costa, MD  Multiple Vitamin (MULTIVITAMIN WITH MINERALS) TABS tablet Take 1 tablet by mouth daily. 04/01/19   Vaughan Basta, MD  OLANZapine zydis (ZYPREXA) 5 MG disintegrating tablet Take 0.5 tablets (2.5 mg total) by mouth at bedtime. Patient not taking: Reported on 06/08/2019 05/15/19   Max Sane, MD  pyridOXINE (VITAMIN B-6) 100 MG tablet Take 100 mg by mouth daily.    [provider]  solifenacin (VESICARE) 10 MG tablet Take 1 tablet (10 mg total) by mouth daily. 10/11/18   Stoioff, Ronda Fairly, MD  tamsulosin (FLOMAX) 0.4 MG CAPS capsule Take 1 capsule (0.4 mg total) by mouth at bedtime. 04/01/19   Vaughan Basta, MD  vitamin B-12 (CYANOCOBALAMIN) 100 MCG tablet Take 100 mcg by mouth daily.     [provider]    Physical Exam: There were no vitals filed for this visit.   Constitutional: NAD, restless   Eyes: PERTLA, lids and conjunctivae normal ENMT: Mucous membranes are moist. Posterior pharynx clear of any exudate or lesions.   Neck: normal, supple, no masses, no thyromegaly Respiratory: Rhonchi bilaterally. Mild tachypnea. No pallor or cyanosis.   Cardiovascular: S1 & S2 heard, regular rate and rhythm. No extremity edema.  Abdomen: No distension, no tenderness, soft. Bowel sounds active.  Musculoskeletal: no clubbing / cyanosis. No joint deformity upper and lower extremities.    Skin: no  significant rashes, lesions, ulcers. Warm, dry, well-perfused. Neurologic: No facial asymmetry. Sensation intact. Moving all extremities.  Psychiatric: Restless, oriented to person and place only. Cooperative.    Labs on Admission: I have personally reviewed following labs and imaging studies  CBC: Recent Labs  Lab 06/08/19 1307  WBC 6.0  NEUTROABS 5.2  HGB 8.1*  HCT 25.5*  MCV 90.1  PLT [id number]   Basic Metabolic Panel: Recent Labs  Lab 06/08/19 1307  NA 135  K 4.6  CL 105  CO2 18*  GLUCOSE 117*  BUN 52*  CREATININE 2.67*  CALCIUM 8.3*   GFR: Estimated Creatinine Clearance: 15.9 mL/min (A) (by C-G formula based on SCr of 2.67 mg/dL (H)). Liver Function Tests: Recent Labs  Lab 06/08/19 1307  AST 31  ALT 20  ALKPHOS 63  BILITOT 0.6  PROT 5.9*  ALBUMIN 2.5*   No results for input(s): LIPASE, AMYLASE in the last 168 hours. No results for  input(s): AMMONIA in the last 168 hours. Coagulation Profile: No results for input(s): INR, PROTIME in the last 168 hours. Cardiac Enzymes: No results for input(s): CKTOTAL, CKMB, CKMBINDEX, TROPONINI in the last 168 hours. BNP (last 3 results) No results for input(s): PROBNP in the last 8760 hours. HbA1C: No results for input(s): HGBA1C in the last 72 hours. CBG: No results for input(s): GLUCAP in the last 168 hours. Lipid Profile: No results for input(s): CHOL, HDL, LDLCALC, TRIG, CHOLHDL, LDLDIRECT in the last 72 hours. Thyroid Function Tests: No results for input(s): TSH, T4TOTAL, FREET4, T3FREE, THYROIDAB in the last 72 hours. Anemia Panel: No results for input(s): VITAMINB12, FOLATE, FERRITIN, TIBC, IRON, RETICCTPCT in the last 72 hours. Urine analysis:    Component Value Date/Time   COLORURINE YELLOW (A) 04/03/2019 2047   APPEARANCEUR HAZY (A) 04/03/2019 2047   APPEARANCEUR Cloudy (A) 07/16/2018 1532   LABSPEC 1.017 04/03/2019 2047   LABSPEC 1.009 11/10/2012 0304   PHURINE 5.0 04/03/2019 2047   GLUCOSEU 150 (A)  04/03/2019 2047   GLUCOSEU Negative 11/10/2012 0304   HGBUR SMALL (A) 04/03/2019 2047   BILIRUBINUR NEGATIVE 04/03/2019 2047   BILIRUBINUR Negative 07/16/2018 1532   BILIRUBINUR Negative 11/10/2012 0304   KETONESUR 5 (A) 04/03/2019 2047   PROTEINUR 100 (A) 04/03/2019 2047   NITRITE NEGATIVE 04/03/2019 2047   LEUKOCYTESUR NEGATIVE 04/03/2019 2047   LEUKOCYTESUR Negative 11/10/2012 0304   Sepsis Labs: @LABRCNTIP (procalcitonin:4,lacticidven:4) ) Recent Results (from the past 240 hour(s))  SARS Coronavirus 2 by RT PCR (hospital order, performed in [place] hospital lab) Nasopharyngeal Nasopharyngeal Swab     Status: Abnormal   Collection Time: 06/08/19  6:15 PM   Specimen: Nasopharyngeal Swab  Result Value Ref Range Status   SARS Coronavirus 2 POSITIVE (A) NEGATIVE Final    Comment: RESULT CALLED TO, READ BACK BY AND VERIFIED WITH: JEANNETE PEREZ @2044  06/08/19 MJU (NOTE) If result is NEGATIVE SARS-CoV-2 target nucleic acids are NOT DETECTED. The SARS-CoV-2 RNA is generally detectable in upper and lower  respiratory specimens during the acute phase of infection. The lowest  concentration of SARS-CoV-2 viral copies this assay can detect is 250  copies / mL. A negative result does not preclude SARS-CoV-2 infection  and should not be used as the sole basis for treatment or other  patient management decisions.  A negative result may occur with  improper specimen collection / handling, submission of specimen other  than nasopharyngeal swab, presence of viral mutation(s) within the  areas targeted by this assay, and inadequate number of viral copies  (<250 copies / mL). A negative result must be combined with clinical  observations, patient history, and epidemiological information. If result is POSITIVE SARS-CoV-2 target nucleic acids are DETECTED. The  SARS-CoV-2 RNA is generally detectable in upper and lower  respiratory specimens during the acute phase of infection.  Positive    results are indicative of active infection with SARS-CoV-2.  Clinical  correlation with patient history and other diagnostic information is  necessary to determine patient infection status.  Positive results do  not rule out bacterial infection or co-infection with other viruses. If result is PRESUMPTIVE POSTIVE SARS-CoV-2 nucleic acids MAY BE PRESENT.   A presumptive positive result was obtained on the submitted specimen  and confirmed on repeat testing.  While 2019 novel coronavirus  (SARS-CoV-2) nucleic acids may be present in the submitted sample  additional confirmatory testing may be necessary for epidemiological  and / or clinical management purposes  to differentiate between  SARS-CoV-2 and other Sarbecovirus currently known to infect humans.  If clinically indicated additional testing with an alternate test  methodology [phone]) is a dvised. The SARS-CoV-2 RNA is generally  detectable in upper and lower respiratory specimens during the acute  phase of infection. The expected result is Negative. Fact Sheet for Patients:  StrictlyIdeas.no Fact Sheet for Healthcare Providers: BankingDealers.co.za This test is not yet approved or cleared by the [place] FDA and has been authorized for detection and/or diagnosis of SARS-CoV-2 by FDA under an Emergency Use Authorization (EUA).  This EUA will remain in effect (meaning this test can be used) for the duration of the COVID-19 declaration under Section 564(b)(1) of the Act, 21 U.S.C. section 360bbb-3(b)(1), unless the authorization is terminated or revoked sooner. Performed at [facility], [address] [place],  [address]      Radiological Exams on Admission: Dg Chest Portable 1 View  Result Date: 06/08/2019 CLINICAL DATA:  Worsening shortness of breath in a COVID-19 positive patient. EXAM: PORTABLE CHEST 1 VIEW COMPARISON:  Single-view of the chest  05/11/2019 and 05/04/2019. FINDINGS: The patient has extensive bilateral airspace disease which is worse on left. Aeration in the upper lung zones has mildly improved since the most recent exam. There is cardiomegaly. No pneumothorax or pleural fluid. Atherosclerosis. IMPRESSION: Extensive bilateral airspace disease is worse on the left. Aeration in the upper lung zones is somewhat improved since the most recent exam. Cardiomegaly. Atherosclerosis. Electronically Signed   By: Inge Rise M.D.   On: 06/08/2019 14:01    EKG: Independently reviewed. Sinus rhythm, LBBB, similar to priors.   Assessment/Plan   1. COVID-19 infection with pneumonia; acute hypoxic respiratory failure  - Diagnosed with COVID-19 on 10/6 at her SNF where there is an active outbreak, now presents to ED with SOB and saturations in 70's with EMS  - She remains positive for COVID-19, has multifocal PNA on CXR, and hypoxia  - Procalcitonin was 6.86 in ED  - Blood cultures were collected in ED and she was treated with vancomycin, cefepime, Decadron, and remdesivir  - Continue steroids, remdesivir, antibiotics, and supplemental O2, continue supportive care    2. CKD stage IV  - SCr is 2.67 in ED, similar to priors  - Renally-dose medications, monitor   3. Insulin-dependent DM  - A1c was 6.0% in September 2020  - Continue long-acting and correctional insulins    4. CAD  - No anginal complaints  - Continue ASA and beta-blocker    5. Hypertension  - BP at goal, continue metoprolol    6. Anemia  - Hgb is 8.1, similar to priors, likely related to CKD  - Monitor    PPE: CAPR, gown, gloves  DVT prophylaxis: sq heparin  Code Status: Partial   Family Communication: Discussed with patient  Consults called: None  Admission status: Inpatient     Vianne Bulls, MD [facility] Pager [phone]  If 7PM-7AM, please contact night-coverage [email or web address] Password [email or web address]  06/14/2019, 12:46 AM

## 2019-06-09 NOTE — TOC Initial Note (Signed)
Transition of Care [facility]) - Initial/Assessment Note    Patient Details  Name: Holly Conner MRN: [id number] Date of Birth: [birth year]  Transition of Care [facility]) CM/SW Contact:    Weston Anna, LCSW Phone Number: 06/07/2019, 1:43 PM  Clinical Narrative:                  CSW spoke with patients legal guardian, [name], who confirmed patient would be returning to [facility] once ready to discharge. CSW spoke with facility and they are able to accept patient once stable. CSW will continue to follow   Expected Discharge Plan: [place] Barriers to Discharge: Continued Medical Work up   Patient Goals and CMS Choice Patient states their goals for this hospitalization and ongoing recovery are:: [place] [email or web address] Compare Post Acute Care list provided to:: Other (Comment Required)(patient longterm care at facility)    Expected Discharge Plan and Services Expected Discharge Plan: [place]                                              Prior Living Arrangements/Services   Lives with:: Facility Resident   Do you feel safe going back to the place where you live?: Yes               Activities of Daily Living Home Assistive Devices/Equipment: Other (Comment)(pt. from nursing facility) ADL Screening (condition at time of admission) Patient's cognitive ability adequate to safely complete daily activities?: No Is the patient deaf or have difficulty hearing?: No Does the patient have difficulty seeing, even when wearing glasses/contacts?: No Does the patient have difficulty concentrating, remembering, or making decisions?: Yes Patient able to express need for assistance with ADLs?: Yes Does the patient have difficulty dressing or bathing?: Yes Independently performs ADLs?: No Communication: Independent Dressing (OT): Needs assistance Is this a change from baseline?: Pre-admission  baseline Grooming: Needs assistance Is this a change from baseline?: Pre-admission baseline Feeding: Needs assistance Is this a change from baseline?: Pre-admission baseline Bathing: Needs assistance Is this a change from baseline?: Pre-admission baseline Toileting: Needs assistance Is this a change from baseline?: Pre-admission baseline In/Out Bed: Needs assistance Is this a change from baseline?: Pre-admission baseline Walks in Home: Needs assistance Is this a change from baseline?: Pre-admission baseline Does the patient have difficulty walking or climbing stairs?: No Weakness of Legs: None Weakness of Arms/Hands: None  Permission Sought/Granted                  Emotional Assessment           Psych Involvement: No (comment)  Admission diagnosis:  COVID 19 Patient Active Problem List   Diagnosis Date Noted  . Pneumonia due to COVID-19 virus 06/08/2019  . Acute on chronic respiratory failure with hypoxia ([place])   . Multifocal pneumonia   . History of cardioembolic cerebrovascular accident (CVA)   . 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 Acute on chronic systolic congestive heart failure ([place])   . Goals of care, counseling/discussion   . Advanced care planning/counseling discussion   . Palliative care by specialist   . Acute respiratory failure ([place]) 05/11/2019  . Acute respiratory failure with hypoxia ([place])   . NSTEMI (non-ST elevated myocardial infarction) ([place]) 05/02/2019  . Memory changes 04/05/2019  . Unresponsive 04/04/2019  . Unresponsive  episode 04/03/2019  . Hypoglycemia 04/03/2019  . Elevated troponin 04/03/2019  . Weakness generalized 03/28/2019  . Hypertensive urgency 12/18/2018  . Stroke ([place]) 12/19/2015  . Insulin-requiring or dependent type II diabetes mellitus ([place]) 12/19/2015  . HTN (hypertension) 12/19/2015  . GERD (gastroesophageal reflux disease) 12/19/2015  . HLD (hyperlipidemia) 12/19/2015  . CKD (chronic kidney disease),  stage IV ([place]) 12/19/2015   PCP:  Dion Body, MD Pharmacy:   [facility], [place] to [address] [place] [address] Phone: [phone] Fax: [place] [address] ([address]), [place] - [place]) [place] [address] Phone: [phone] Fax: [phone]     Social Determinants of Health ([place]) Interventions    Readmission Risk Interventions Readmission Risk Prevention Plan 05/12/2019  Transportation Screening Complete  Medication Review (RN Care Manager) Referral to Pharmacy  PCP or Specialist appointment within 3-5 days of discharge Complete  HRI or Home Care Consult Complete  SW Recovery Care/Counseling Consult Complete  Palliative Care Screening Not [place] Complete  Some recent data might be hidden

## 2019-06-09 NOTE — Progress Notes (Signed)
   Vital Signs MEWS/VS Documentation      06/08/2019 1915 06/01/2019 2100 06/08/2019 2140 05/29/2019 2153   MEWS Score:  2  2  3  3    MEWS Score Color:  Yellow  Yellow  Yellow  Yellow   Resp:  (!) 30   36  (!) 36  -   Pulse:  88  86  85  88   BP:  (!) 136/55  131/68   131/68  -   Temp:  (!) 97.4 F (36.3 C) 97.7  97.7 F (36.5 C)  -   O2 Device:  Nasal Cannula;HFNC  HFNC  HFNC  HFNC   O2 Flow Rate (L/min):  5 L/min  7 L/min  10 L/min  15 L/min       Pt more restless, O2 bumped up to 15L to maintain sats 90-91% currently, appears very hypoxic. Dr. Olevia Bowens informed. For ABG and possible Bipap and ICU transfer. Charge RN informed    Melvyn Neth 06/08/2019,9:54 PM

## 2019-06-09 NOTE — Progress Notes (Addendum)
Holly Conner  [id number] DOB: Apr 22, 1941 DOA: 05/31/2019 PCP: Dion Body, MD    Brief Narrative:  78 year old with a history of CVA, CAD, dementia, CKD stage IV, DM 2, and hypertension who was sent to the ED from her SNF with dyspnea and hypoxia.  There were clusters of residents from her SNF who are known to have tested positive for Covid.  She was found to be + 10/6.  Because of her respiratory symptoms she was sent to the ED 10/11.  Upon initial assessment by EMS she was found to be saturating in the 70s on room air.  In the ED she was tachypneic into the 30s.  Chest x-ray noted extensive bilateral airspace disease.  Significant Events: 10/6 SARS-CoV-2 test positive 10/11 admit to the [facility] via [facility] ED  COVID-19 specific Treatment: Decadron 10/11 > Remdesivir 10/11 >  Subjective: Since arrival at the [facility] the patient has been quite agitated and confused.  She is not able to be redirected verbally.  When I going to see her she is laying up against a bed rail in the bed.  She tells me she is short of breath.  She tells me she is in [place].  She is not oriented to time place or situation.  She does not appear to be in extremis.  There is no evidence of uncontrolled pain.  Assessment & Plan:  Covid pneumonia -acute hypoxic respiratory failure Also being treated with empiric antibiotics given elevated procalcitonin - continue Decadron and remdesivir  Recent Labs  Lab 06/08/19 1307  CRP 8.5*  ALT 20  PROCALCITON 6.86   CKD stage IV Baseline creatinine approximately 2.6  Recent Labs  Lab 06/08/19 1307  CREATININE 2.67*    DM 2 A1c 6.0 September 2020 - utilize SSI and follow trends - CBG currently poorly controlled   History of CAD Continue aspirin and beta-blocker  HTN Blood pressure currently controlled  Chronic anemia of CKD Baseline hemoglobin approximately 8 -monitor  DVT prophylaxis: SQ heparin  Code Status: DNR - NO CODE BLUE (but BIPAP  permissible) Family Communication:  Disposition Plan: med/surg bed   Consultants:  none  Antimicrobials:  Azithromycin 10/11 > Ceftriaxone 10/11 > Cefepime 10/11  Vancomycin 10/11   Objective: Blood pressure (!) 140/92, pulse 79, temperature 98.5 F (36.9 C), temperature source Oral, resp. rate (!) 23, height 5\' 1"  (1.549 m), weight 82 kg, SpO2 96 %.  Intake/Output Summary (Last 24 hours) at 06/01/2019 1016 Last data filed at 06/02/2019 0600 Gross per 24 hour  Intake 692.59 ml  Output -  Net 692.59 ml   Filed Weights   06/11/2019 0244  Weight: 82 kg    Examination: General: No acute respiratory distress apparent  Lungs: fine diffuse crackles - no wheezing  Cardiovascular: Regular rate and rhythm without murmur  Abdomen: Nontender, nondistended, soft, bowel sounds positive, no rebound, no ascites, no appreciable mass Extremities: trace B LE edema   CBC: Recent Labs  Lab 06/08/19 1307  WBC 6.0  NEUTROABS 5.2  HGB 8.1*  HCT 25.5*  MCV 90.1  PLT [id number]   Basic Metabolic Panel: Recent Labs  Lab 06/08/19 1307  NA 135  K 4.6  CL 105  CO2 18*  GLUCOSE 117*  BUN 52*  CREATININE 2.67*  CALCIUM 8.3*   GFR: Estimated Creatinine Clearance: 16.3 mL/min (A) (by C-G formula based on SCr of 2.67 mg/dL (H)).  Liver Function Tests: Recent Labs  Lab 06/08/19 1307  AST 31  ALT 20  ALKPHOS  75  BILITOT 0.6  PROT 5.9*  ALBUMIN 2.5*    HbA1C: Hgb A1c MFr Bld  Date/Time Value Ref Range Status  05/03/2019 04:49 AM 6.0 (H) 4.8 - 5.6 % Final    Comment:    (NOTE) Pre diabetes:          5.7%-6.4% Diabetes:              >6.4% Glycemic control for   <7.0% adults with diabetes   03/28/2019 06:38 AM 6.9 (H) 4.8 - 5.6 % Final    Comment:    (NOTE) Pre diabetes:          5.7%-6.4% Diabetes:              >6.4% Glycemic control for   <7.0% adults with diabetes     CBG: Recent Labs  Lab 06/15/2019 0117 06/27/2019 0752  GLUCAP 368* 320*    Recent Results (from  the past 240 hour(s))  Blood culture (routine x 2)     Status: None (Preliminary result)   Collection Time: 06/08/19  1:38 PM   Specimen: BLOOD  Result Value Ref Range Status   Specimen Description BLOOD L H  Final   Special Requests   Final    BOTTLES DRAWN AEROBIC AND ANAEROBIC Blood Culture adequate volume   Culture   Final    NO GROWTH < 24 HOURS Performed at [facility], [address], [place], [place] [address]    Report Status PENDING  Incomplete  Blood culture (routine x 2)     Status: None (Preliminary result)   Collection Time: 06/08/19  1:39 PM   Specimen: BLOOD  Result Value Ref Range Status   Specimen Description BLOOD R H  Final   Special Requests   Final    BOTTLES DRAWN AEROBIC AND ANAEROBIC Blood Culture results may not be optimal due to an inadequate volume of blood received in culture bottles   Culture   Final    NO GROWTH < 24 HOURS Performed at [facility], [address] [place], [place] [address]    Report Status PENDING  Incomplete  SARS Coronavirus 2 by RT PCR (hospital order, performed in [place] hospital lab) Nasopharyngeal Nasopharyngeal Swab     Status: Abnormal   Collection Time: 06/08/19  6:15 PM   Specimen: Nasopharyngeal Swab  Result Value Ref Range Status   SARS Coronavirus 2 POSITIVE (A) NEGATIVE Final    Comment: RESULT CALLED TO, READ BACK BY AND VERIFIED WITH: JEANNETE PEREZ @2044  06/08/19 MJU (NOTE) If result is NEGATIVE SARS-CoV-2 target nucleic acids are NOT DETECTED. The SARS-CoV-2 RNA is generally detectable in upper and lower  respiratory specimens during the acute phase of infection. The lowest  concentration of SARS-CoV-2 viral copies this assay can detect is 250  copies / mL. A negative result does not preclude SARS-CoV-2 infection  and should not be used as the sole basis for treatment or other  patient management decisions.  A negative result may occur with  improper specimen collection / handling,  submission of specimen other  than nasopharyngeal swab, presence of viral mutation(s) within the  areas targeted by this assay, and inadequate number of viral copies  (<250 copies / mL). A negative result must be combined with clinical  observations, patient history, and epidemiological information. If result is POSITIVE SARS-CoV-2 target nucleic acids are DETECTED. The  SARS-CoV-2 RNA is generally detectable in upper and lower  respiratory specimens during the acute phase of infection.  Positive  results are indicative of active infection with SARS-CoV-2.  Clinical  correlation with patient history and other diagnostic information is  necessary to determine patient infection status.  Positive results do  not rule out bacterial infection or co-infection with other viruses. If result is PRESUMPTIVE POSTIVE SARS-CoV-2 nucleic acids MAY BE PRESENT.   A presumptive positive result was obtained on the submitted specimen  and confirmed on repeat testing.  While 2019 novel coronavirus  (SARS-CoV-2) nucleic acids may be present in the submitted sample  additional confirmatory testing may be necessary for epidemiological  and / or clinical management purposes  to differentiate between  SARS-CoV-2 and other Sarbecovirus currently known to infect humans.  If clinically indicated additional testing with an alternate test  methodology [phone]) is a dvised. The SARS-CoV-2 RNA is generally  detectable in upper and lower respiratory specimens during the acute  phase of infection. The expected result is Negative. Fact Sheet for Patients:  StrictlyIdeas.no Fact Sheet for Healthcare Providers: BankingDealers.co.za This test is not yet approved or cleared by the [place] FDA and has been authorized for detection and/or diagnosis of SARS-CoV-2 by FDA under an Emergency Use Authorization (EUA).  This EUA will remain in effect (meaning this test can be  used) for the duration of the COVID-19 declaration under Section 564(b)(1) of the Act, 21 U.S.C. section 360bbb-3(b)(1), unless the authorization is terminated or revoked sooner. Performed at [facility], [address] [place], [place] [address]      Scheduled Meds: . aspirin EC  81 mg Oral Daily  . darifenacin  15 mg Oral Daily  . dexamethasone (DECADRON) injection  6 mg Intravenous Q24H  . heparin  5,000 Units Subcutaneous Q8H  . insulin aspart  0-5 Units Subcutaneous QHS  . insulin aspart  0-9 Units Subcutaneous TID WC  . insulin glargine  7 Units Subcutaneous QHS  . metoprolol tartrate  25 mg Oral BID  . sodium chloride flush  3 mL Intravenous Q12H  . sodium chloride flush  3 mL Intravenous Q12H  . tamsulosin  0.4 mg Oral QPC supper   Continuous Infusions: . sodium chloride 250 mL (06/17/2019 0136)  . azithromycin    . cefTRIAXone (ROCEPHIN)  IV 2 g (06/28/2019 0541)  . remdesivir 100 mg in NS 250 mL       LOS: 0 days   Cherene Altes, MD [facility] Office  [phone] Pager - Text Page per Amion  If 7PM-7AM, please contact night-coverage per Amion 06/02/2019, 10:16 AM

## 2019-06-09 NOTE — Plan of Care (Signed)
  Problem: Respiratory: Goal: Will maintain a patent airway Outcome: Progressing Goal: Complications related to the disease process, condition or treatment will be avoided or minimized Outcome: Progressing   

## 2019-06-09 NOTE — Progress Notes (Signed)
Had been requested by physician to speak with niece about an end of life visit. Called niece [name] [phone]. Explained process of end of life visit and there could be 2 visitors, there was one visit, as well process of ppe for visitors. She stated wouldn't visit if her aunt did not benefit or was unaware of her presence.She also stated she had some concerns as there are immunocompromised people in her family. She asked if I knew if her aunt was alert. I told her I would speak with her RN and call her back if okay. She said it was fine.I spoke with staff about patient, said patient was currently asleep. Called Ms. Rose back explained was asleep, but had been disoriented earlier. Niece wanted staff to ask if she wanted to see her when the patient woke up. Explained we would call her if her Aunt wanted her to visit or if her status declined. Reassured her would not be an issue to arrange for a visit. Offered Ms. Rose words of support and encouragement. She said she would keep her phone close by.

## 2019-06-09 NOTE — Progress Notes (Signed)
RT Note:  Called to obtain ABG on patient.  Upon arrival patient on 15L HFNC. Audible need for NTS and deep oral suction noted.  Small amount obtained. Added NRB mask for supplemental oxygen.  Patient resltess, pulling at NRB mask.  ABG results given to MD. Contact to be made with family for further care.

## 2019-06-09 NOTE — Progress Notes (Signed)
Night shift coverage note.  The patient has had progressively worse dyspnea with hypoxia associated with decreased mentation, restlessness, respiratory distress. She has been vigorously suctioned and is currently on HFNC and NRB mask. I ordered BiPAP, per patient's wishes, but given her decreased level of consciousness and increased secretions, this is contraindicated due to the risk of aspiration pneumonia.   I spoke to Riccardo Dubin, the patient's sister, at 22:25 to inform her about the patient's clinical status. She asked me to call her daughter (Patient's niece [name]) who is making decisions for her. At 2246, I spoke to Ms. Rose for about 20 minutes explaining the patient's clinical condition, measures taken, medications given, poor prognosis even with ETI/MV and the inability to use BiPAP without risking complications. I also asked our [facility] for the shift to contact her to schedule a bedside visit.  PORTABLE CHEST 1 VIEW  COMPARISON:  Chest radiograph 06/08/2019  FINDINGS: Multifocal airspace opacities involving the right upper lobe, right lower lobe and left mid to lower lung field. The right upper lobe airspace opacity appears new since the prior radiograph. No pneumothorax. No large pleural effusion. Stable cardiomegaly. No acute osseous pathology.  IMPRESSION: 1. Multi focal airspace opacities. 2. Right upper lobe airspace opacity appears new since the prior radiograph. Follow-up recommended.  COVID-19 infection with pneumonia; acute hypoxic respiratory failure  DNI/DNR BiPAP ventilation contraindicated. Continue suctioning as needed. Continue HF [place] and NRB mask O2. Glycopyrrolate trial for secretions.  Morphine and haloperidol for restlessness. Metronidazole IV added for possible aspiration.  Tennis Must, MD

## 2019-06-10 LAB — MAGNESIUM: Magnesium: 2.6 mg/dL — ABNORMAL HIGH (ref 1.7–2.4)

## 2019-06-10 LAB — COMPREHENSIVE METABOLIC PANEL
ALT: 20 U/L (ref 0–44)
AST: 60 U/L — ABNORMAL HIGH (ref 15–41)
Albumin: 2.5 g/dL — ABNORMAL LOW (ref 3.5–5.0)
Alkaline Phosphatase: 68 U/L (ref 38–126)
Anion gap: 16 — ABNORMAL HIGH (ref 5–15)
BUN: 74 mg/dL — ABNORMAL HIGH (ref 8–23)
CO2: 12 mmol/L — ABNORMAL LOW (ref 22–32)
Calcium: 8.5 mg/dL — ABNORMAL LOW (ref 8.9–10.3)
Chloride: 108 mmol/L (ref 98–111)
Creatinine, Ser: 3.34 mg/dL — ABNORMAL HIGH (ref 0.44–1.00)
GFR calc Af Amer: 14 mL/min — ABNORMAL LOW (ref >60)
GFR calc non Af Amer: 12 mL/min — ABNORMAL LOW (ref >60)
Glucose, Bld: 270 mg/dL — ABNORMAL HIGH (ref 70–99)
Potassium: 5.8 mmol/L — ABNORMAL HIGH (ref 3.5–5.1)
Sodium: 136 mmol/L (ref 135–145)
Total Bilirubin: 0.4 mg/dL (ref 0.3–1.2)
Total Protein: 6.2 g/dL — ABNORMAL LOW (ref 6.5–8.1)

## 2019-06-10 LAB — POCT I-STAT 7, (LYTES, BLD GAS, ICA,H+H)
Acid-base deficit: 12 mmol/L — ABNORMAL HIGH (ref 0.0–2.0)
Bicarbonate: 13.6 mmol/L — ABNORMAL LOW (ref 20.0–28.0)
Calcium, Ion: 1.26 mmol/L (ref 1.15–1.40)
HCT: 26 % — ABNORMAL LOW (ref 36.0–46.0)
Hemoglobin: 8.8 g/dL — ABNORMAL LOW (ref 12.0–15.0)
O2 Saturation: 75 %
Patient temperature: 98.7
Potassium: 5 mmol/L (ref 3.5–5.1)
Sodium: 136 mmol/L (ref 135–145)
TCO2: 15 mmol/L — ABNORMAL LOW (ref 22–32)
pCO2 arterial: 30.7 mmHg — ABNORMAL LOW (ref 32.0–48.0)
pH, Arterial: 7.255 — ABNORMAL LOW (ref 7.350–7.450)
pO2, Arterial: 46 mmHg — ABNORMAL LOW (ref 83.0–108.0)

## 2019-06-10 LAB — CBC WITH DIFFERENTIAL/PLATELET
Abs Immature Granulocytes: 0.06 10*3/uL (ref 0.00–0.07)
Basophils Absolute: 0 10*3/uL (ref 0.0–0.1)
Basophils Relative: 0 %
Eosinophils Absolute: 0 10*3/uL (ref 0.0–0.5)
Eosinophils Relative: 0 %
HCT: 27.8 % — ABNORMAL LOW (ref 36.0–46.0)
Hemoglobin: 8.4 g/dL — ABNORMAL LOW (ref 12.0–15.0)
Immature Granulocytes: 1 %
Lymphocytes Relative: 5 %
Lymphs Abs: 0.6 10*3/uL — ABNORMAL LOW (ref 0.7–4.0)
MCH: 28.8 pg (ref 26.0–34.0)
MCHC: 30.2 g/dL (ref 30.0–36.0)
MCV: 95.2 fL (ref 80.0–100.0)
Monocytes Absolute: 0.3 10*3/uL (ref 0.1–1.0)
Monocytes Relative: 3 %
Neutro Abs: 11.6 10*3/uL — ABNORMAL HIGH (ref 1.7–7.7)
Neutrophils Relative %: 91 %
Platelets: 369 10*3/uL (ref 150–400)
RBC: 2.92 MIL/uL — ABNORMAL LOW (ref 3.87–5.11)
RDW: 15.4 % (ref 11.5–15.5)
WBC: 12.7 10*3/uL — ABNORMAL HIGH (ref 4.0–10.5)
nRBC: 0.2 % (ref 0.0–0.2)

## 2019-06-10 LAB — FERRITIN: Ferritin: 266 ng/mL (ref 11–307)

## 2019-06-10 LAB — GLUCOSE, CAPILLARY
Glucose-Capillary: 298 mg/dL — ABNORMAL HIGH (ref 70–99)
Glucose-Capillary: 233 mg/dL — ABNORMAL HIGH (ref 70–99)
Glucose-Capillary: 215 mg/dL — ABNORMAL HIGH (ref 70–99)
Glucose-Capillary: 154 mg/dL — ABNORMAL HIGH (ref 70–99)
Glucose-Capillary: 101 mg/dL — ABNORMAL HIGH (ref 70–99)

## 2019-06-10 LAB — D-DIMER, QUANTITATIVE: D-Dimer, Quant: 2.91 ug/mL-FEU — ABNORMAL HIGH (ref 0.00–0.50)

## 2019-06-10 LAB — C-REACTIVE PROTEIN: CRP: 7.7 mg/dL — ABNORMAL HIGH (ref <1.0)

## 2019-06-10 MED ORDER — ACETAMINOPHEN 650 MG RE SUPP: 325.0000 mg | RECTAL | Status: DC | PRN

## 2019-06-10 MED ORDER — LORAZEPAM 2 MG/ML IJ SOLN: 2.0000 mg | INTRAMUSCULAR | Status: DC | PRN

## 2019-06-10 MED ORDER — MORPHINE SULFATE (PF) 2 MG/ML IV SOLN
2.0000 mg | INTRAVENOUS | Status: DC | PRN
  Administered 2019-06-10 – 2019-06-12 (×2): 2 mg via INTRAVENOUS
  Filled 2019-06-10 (×2): qty 1

## 2019-06-10 MED ORDER — MORPHINE 100MG IN NS 100ML (1MG/ML) PREMIX INFUSION
1.0000 mg/h | INTRAVENOUS | Status: DC
  Administered 2019-06-10: 10:00:00 1 mg/h via INTRAVENOUS
  Filled 2019-06-10: qty 100

## 2019-06-10 MED ORDER — GLYCOPYRROLATE 0.2 MG/ML IJ SOLN: 0.1000 mg | INTRAMUSCULAR | Status: DC | PRN

## 2019-06-10 NOTE — Plan of Care (Signed)
Patient unresponsive, friend [name] updated/educated on COVID and verbalizes understanding over phone

## 2019-06-10 NOTE — Progress Notes (Signed)
   Vital Signs MEWS/VS Documentation      06/16/2019 2153 06/21/2019 2216  06/11/2019 2354   MEWS Score:  3  3   3    MEWS Score Color:  Yellow  Yellow   Yellow   Resp:  -  (!) 38   (!) 31   Pulse:  88  91   81   BP:  -  -   (!) 161/66   Temp:  -  -   97.7 F (36.5 C)   O2 Device:  HFNC  HFNC;Non-rebreather Mask   HFNC;Non-rebreather Mask   O2 Flow Rate (L/min):  15 L/min  15 L/min   15 L/min      Restlessness has improved slightly, continued on telemetry monitoring. 2 hourly VS will be continued for now.      Lavaun Greenfield 06/10/2019,12:45 AM

## 2019-06-10 NOTE — Plan of Care (Signed)
Pt slept has rapidly deteriorated through the night. Current;y on 15L O2 via HFNC and NRB mask. Was very restless and disoriented last night, currently lethargic and opens eyes to voice only. Breathing very labored, tachypnic, using all accessory muscles. Spoke with on call MD and charge RN throughout the night re: pt's deterioration. Per MD, we will keep pt on current code status but since pt is partial code (BIPap only) and is not suitable for BiPap, she is not a candidate for ICU transfer.  But we will continue q4 vitals for now and telemetry monitoring. However, to ensure pt's comfort, prn meds prescribed and given signs of air hunger, pain and restlessness. Foley catheter also inserted for comfort. Family (niece) updated by [facility] and MD re: pt's condition and plan of care. Skin assessed, dressings dry and intact. IV SL. Total care rendered, Regular oral and nasopharyngeal suctioning done for secretions. 1:1 sitter at bedside. Will continue to monitor.   Problem: Education: Goal: Knowledge of risk factors and measures for prevention of condition will improve 06/10/2019 0405 by Melvyn Neth, RN Outcome: Not Progressing 06/10/2019 0352 by Melvyn Neth, RN Outcome: Progressing   Problem: Coping: Goal: Psychosocial and spiritual needs will be supported 06/10/2019 0405 by Melvyn Neth, RN Outcome: Not Progressing 06/10/2019 0352 by Melvyn Neth, RN Outcome: Progressing   Problem: Respiratory: Goal: Will maintain a patent airway 06/10/2019 0405 by Melvyn Neth, RN Outcome: Not Progressing 06/10/2019 0352 by Melvyn Neth, RN Outcome: Progressing Goal: Complications related to the disease process, condition or treatment will be avoided or minimized 06/10/2019 0405 by Melvyn Neth, RN Outcome: Not Progressing 06/10/2019 0352 by Melvyn Neth, RN Outcome: Progressing

## 2019-06-10 NOTE — Progress Notes (Signed)
Inpatient Diabetes Program Recommendations  AACE/ADA: New Consensus Statement on Inpatient Glycemic Control   Target Ranges:  Prepandial:   less than 140 mg/dL      Peak postprandial:   less than 180 mg/dL (1-2 hours)      Critically ill patients:  140 - 180 mg/dL   Results for Holly Conner, Holly Conner (MRN [id number]) as of 06/10/2019 13:42  Ref. Range 06/02/2019 07:52 06/02/2019 11:55 06/11/2019 16:24 06/08/2019 21:31 06/10/2019 07:47 06/10/2019 11:42  Glucose-Capillary Latest Ref Range: 70 - 99 mg/dL 320 (H) 364 (H) 333 (H) 298 (H) 233 (H) 215 (H)   Review of Glycemic Control  Diabetes history: DM2 Outpatient Diabetes medications: Tresiba 10 units QHS, Novolog 0-12 units TID with meals Current orders for Inpatient glycemic control: Lantus 14 units QHS, Novolog 0-20 units TID with meals, Novolog 0-5 units QHS; Decadron 6 mg Q24H  Inpatient Diabetes Program Recommendations:   Insulin-Basal: If steroids are continued as ordered, please consider increasing Lantus to 17 units QHS.  Insulin-Meal Coverage: If steroids are continued as ordered, please consider ordering Novolog 5 units TID with meals for meal coverage if patient eats at least 50% of meals.  Thanks, Barnie Alderman, RN, MSN, CDE Diabetes Coordinator Inpatient Diabetes Program [phone] (Team Pager from 8am to 5pm)

## 2019-06-10 NOTE — Plan of Care (Addendum)
Patient resting, attempted to D/C NRB and keep on HFNC but patient desated to 80% and NRB placed back on patient. Morphine drip decreased to 49ml/hr. Will cont. To monitor. RR 12-14/minute at present. Tele cont. SR with HR in 60. Dr. Olevia Bowens notified of BS 101, patient unresponsive and Lantus order. Will hold dose tonight.  MEWS score red, Dr. Olevia Bowens notified, called back and patient status, vital signs reviewed with him and order to stop Morphine drip. D/C'ed drip per order will cont. To monitor 65 cc Morphine wasted down sink, witnessed by Patrecia Pour.

## 2019-06-10 NOTE — Progress Notes (Signed)
   06/10/19 1600  Family/Significant Other Communication  Family/Significant Other Update Called;Updated   Daughter

## 2019-06-10 NOTE — Progress Notes (Addendum)
Holly Conner  [id number] DOB: 03/03/1941 DOA: 06/10/2019 PCP: Dion Body, MD    Brief Narrative:  78 year old with a history of CVA, CAD, dementia, CKD stage IV, DM 2, and hypertension who was sent to the ED from her SNF with dyspnea and hypoxia.  There were clusters of residents from her SNF who are known to have tested positive for Covid.  She was found to be + 10/6.  Because of her respiratory symptoms she was sent to the ED 10/11.  Upon initial assessment by EMS she was found to be saturating in the 70s on room air.  In the ED she was tachypneic into the 30s.  Chest x-ray noted extensive bilateral airspace disease.  Significant Events: 10/6 SARS-CoV-2 test positive 10/11 admit to the [facility] via [facility] ED  COVID-19 specific Treatment: Decadron 10/11 > Remdesivir 10/11 >  Subjective: Oxygen requirement dramatically increased overnight.  The patient is not an active state of decline and looks very uncomfortable at time of my exam.  She cannot provide a reliable history.  She is delirious and agitated.  She is clearly dyspneic with marked increased work of breathing.  Multiple conversations were had with her family last night regarding her serious declining condition and probable impending death.  Assessment & Plan:  Covid pneumonia -acute hypoxic respiratory failure Continue empiric antibiotic for now given elevated procalcitonin -continue Decadron and remdesivir for now, but if patient does not show evidence of improvement later today it would be most appropriate to transition to a full comfort only approach -we will go ahead and administer low-dose morphine drip for now given her refractory severe dyspnea/obvious air hunger   Recent Labs  Lab 06/08/19 1307 06/10/19 0115  DDIMER  --  2.91*  FERRITIN  --  266  CRP 8.5* 7.7*  ALT 20 20  PROCALCITON 6.86  --    CKD stage IV Baseline creatinine approximately 2.6 -creatinine worsening today consistent with her overall  decline  Recent Labs  Lab 06/08/19 1307 06/10/19 0115  CREATININE 2.67* 3.34*    DM 2 A1c 6.0 September 2020 - utilize SSI and follow trends - CBG poorly controlled with concomitant steroid use -adjust treatment and follow  History of CAD Does not appear to be an active issue at this time -unable to take oral meds presently  HTN Blood pressure currently controlled  Chronic anemia of CKD Baseline hemoglobin approximately 8 -steady  Goals of Care I have clarified the patient's CODE STATUS with a changed to DNR -her previous CODE STATUS was listed as limited code allowing only BiPAP -with her Covid diagnosis as well as her probable aspiration BiPAP is not appropriate and likely harmful therefore for clarity sake she is a NO CODE BLUE -as noted above if there is no evidence of clinical improvement or even stabilization early afternoon we would need to transition to comfort focused care only -for now, given that are active medical treatment is not contributing to her discomfort, we will persist  DVT prophylaxis: SQ heparin  Code Status: DNR - NO CODE BLUE  Family Communication: spoke w/ POA [name] [phone] -explained the patient was now much more comfortable with combination of ongoing conservative medical care and a morphine drip -explained the patient's overall rapid medical decline -we agreed to continue conservative medical care for now and reassess tomorrow -if she has not shown signs of improvement or at least stabilization by 10/14 we will then consider transitioning to full comfort care only (meaning stopping  steroids and remdesivir) Disposition Plan: med/surg bed   Consultants:  none  Antimicrobials:  Azithromycin 10/11 > Ceftriaxone 10/11 > Cefepime 10/11  Vancomycin 10/11   Objective: Blood pressure (!) 113/45, pulse 68, temperature 97.8 F (36.6 C), temperature source Axillary, resp. rate (!) 28, height 5\' 1"  (1.549 m), weight 82 kg, SpO2 93 %.  Intake/Output  Summary (Last 24 hours) at 06/10/2019 1157 Last data filed at 06/10/2019 1116 Gross per 24 hour  Intake 1224.47 ml  Output 352 ml  Net 872.47 ml   Filed Weights   06/15/2019 0244  Weight: 82 kg    Examination: General: Clear air hunger/dyspnea with delirium and agitation Lungs: Diffuse coarse crackles with no wheezing Cardiovascular: Regular rate and rhythm Abdomen: Soft, not distended, bowel sounds hypoactive Extremities: trace B LE edema without significant change  CBC: Recent Labs  Lab 06/08/19 1307 06/10/19 0650  WBC 6.0 12.7*  NEUTROABS 5.2 11.6*  HGB 8.1* 8.4*  HCT 25.5* 27.8*  MCV 90.1 95.2  PLT 356 0000000   Basic Metabolic Panel: Recent Labs  Lab 06/08/19 1307 06/10/19 0115  NA 135 136  K 4.6 5.8*  CL 105 108  CO2 18* 12*  GLUCOSE 117* 270*  BUN 52* 74*  CREATININE 2.67* 3.34*  CALCIUM 8.3* 8.5*  MG  --  2.6*   GFR: Estimated Creatinine Clearance: 13 mL/min (A) (by C-G formula based on SCr of 3.34 mg/dL (H)).  Liver Function Tests: Recent Labs  Lab 06/08/19 1307 06/10/19 0115  AST 31 60*  ALT 20 20  ALKPHOS 63 68  BILITOT 0.6 0.4  PROT 5.9* 6.2*  ALBUMIN 2.5* 2.5*    HbA1C: Hgb A1c MFr Bld  Date/Time Value Ref Range Status  05/03/2019 04:49 AM 6.0 (H) 4.8 - 5.6 % Final    Comment:    (NOTE) Pre diabetes:          5.7%-6.4% Diabetes:              >6.4% Glycemic control for   <7.0% adults with diabetes   03/28/2019 06:38 AM 6.9 (H) 4.8 - 5.6 % Final    Comment:    (NOTE) Pre diabetes:          5.7%-6.4% Diabetes:              >6.4% Glycemic control for   <7.0% adults with diabetes     CBG: Recent Labs  Lab 06/01/2019 1155 06/24/2019 1624 06/21/2019 2131 06/10/19 0747 06/10/19 1142  GLUCAP 364* 333* 298* 233* 215*    Recent Results (from the past 240 hour(s))  Blood culture (routine x 2)     Status: None (Preliminary result)   Collection Time: 06/08/19  1:38 PM   Specimen: BLOOD  Result Value Ref Range Status   Specimen  Description BLOOD L H  Final   Special Requests   Final    BOTTLES DRAWN AEROBIC AND ANAEROBIC Blood Culture adequate volume   Culture   Final    NO GROWTH 2 DAYS Performed at [facility], [address] [place], [place] [address]    Report Status PENDING  Incomplete  Blood culture (routine x 2)     Status: None (Preliminary result)   Collection Time: 06/08/19  1:39 PM   Specimen: BLOOD  Result Value Ref Range Status   Specimen Description BLOOD R H  Final   Special Requests   Final    BOTTLES DRAWN AEROBIC AND ANAEROBIC Blood Culture results may not be optimal due  to an inadequate volume of blood received in culture bottles   Culture   Final    NO GROWTH 2 DAYS Performed at [facility], [address] [place], [place] [address]    Report Status PENDING  Incomplete  SARS Coronavirus 2 by RT PCR (hospital order, performed in [facility] hospital lab) Nasopharyngeal Nasopharyngeal Swab     Status: Abnormal   Collection Time: 06/08/19  6:15 PM   Specimen: Nasopharyngeal Swab  Result Value Ref Range Status   SARS Coronavirus 2 POSITIVE (A) NEGATIVE Final    Comment: RESULT CALLED TO, READ BACK BY AND VERIFIED WITH: JEANNETE PEREZ @2044  06/08/19 MJU (NOTE) If result is NEGATIVE SARS-CoV-2 target nucleic acids are NOT DETECTED. The SARS-CoV-2 RNA is generally detectable in upper and lower  respiratory specimens during the acute phase of infection. The lowest  concentration of SARS-CoV-2 viral copies this assay can detect is 250  copies / mL. A negative result does not preclude SARS-CoV-2 infection  and should not be used as the sole basis for treatment or other  patient management decisions.  A negative result may occur with  improper specimen collection / handling, submission of specimen other  than nasopharyngeal swab, presence of viral mutation(s) within the  areas targeted by this assay, and inadequate number of viral copies  (<250 copies / mL). A negative  result must be combined with clinical  observations, patient history, and epidemiological information. If result is POSITIVE SARS-CoV-2 target nucleic acids are DETECTED. The  SARS-CoV-2 RNA is generally detectable in upper and lower  respiratory specimens during the acute phase of infection.  Positive  results are indicative of active infection with SARS-CoV-2.  Clinical  correlation with patient history and other diagnostic information is  necessary to determine patient infection status.  Positive results do  not rule out bacterial infection or co-infection with other viruses. If result is PRESUMPTIVE POSTIVE SARS-CoV-2 nucleic acids MAY BE PRESENT.   A presumptive positive result was obtained on the submitted specimen  and confirmed on repeat testing.  While 2019 novel coronavirus  (SARS-CoV-2) nucleic acids may be present in the submitted sample  additional confirmatory testing may be necessary for epidemiological  and / or clinical management purposes  to differentiate between  SARS-CoV-2 and other Sarbecovirus currently known to infect humans.  If clinically indicated additional testing with an alternate test  methodology [phone]) is a dvised. The SARS-CoV-2 RNA is generally  detectable in upper and lower respiratory specimens during the acute  phase of infection. The expected result is Negative. Fact Sheet for Patients:  StrictlyIdeas.no Fact Sheet for Healthcare Providers: BankingDealers.co.za This test is not yet approved or cleared by the [place] FDA and has been authorized for detection and/or diagnosis of SARS-CoV-2 by FDA under an Emergency Use Authorization (EUA).  This EUA will remain in effect (meaning this test can be used) for the duration of the COVID-19 declaration under Section 564(b)(1) of the Act, 21 U.S.C. section 360bbb-3(b)(1), unless the authorization is terminated or revoked sooner. Performed at  [facility], [address] [place], [place] [address]      Scheduled Meds: . dexamethasone (DECADRON) injection  6 mg Intravenous Q24H  . heparin  5,000 Units Subcutaneous Q8H  . insulin aspart  0-20 Units Subcutaneous TID WC  . insulin aspart  0-5 Units Subcutaneous QHS  . insulin glargine  14 Units Subcutaneous QHS  . sodium chloride flush  3 mL Intravenous Q12H   Continuous Infusions: .  cefTRIAXone (ROCEPHIN)  IV Stopped (06/10/19 0533)  . metronidazole 500 mg (06/10/19 0936)  . morphine 1 mg/hr (06/10/19 1008)  . remdesivir 100 mg in NS 250 mL Stopped (06/06/2019 1708)     LOS: 1 day   Cherene Altes, MD [facility] Office  [phone] Pager - Text Page per Amion  If 7PM-7AM, please contact night-coverage per Amion 06/10/2019, 11:57 AM

## 2019-06-11 LAB — GLUCOSE, CAPILLARY
Glucose-Capillary: 193 mg/dL — ABNORMAL HIGH (ref 70–99)
Glucose-Capillary: 209 mg/dL — ABNORMAL HIGH (ref 70–99)
Glucose-Capillary: 172 mg/dL — ABNORMAL HIGH (ref 70–99)
Glucose-Capillary: 143 mg/dL — ABNORMAL HIGH (ref 70–99)

## 2019-06-11 MED ORDER — ORAL CARE MOUTH RINSE
15.0000 mL | Freq: Two times a day (BID) | OROMUCOSAL | Status: DC
  Administered 2019-06-11 (×2): 15 mL via OROMUCOSAL

## 2019-06-11 MED ORDER — CHLORHEXIDINE GLUCONATE 0.12 % MT SOLN
15.0000 mL | Freq: Two times a day (BID) | OROMUCOSAL | Status: DC
  Administered 2019-06-11 (×2): 15 mL via OROMUCOSAL
  Filled 2019-06-11 (×2): qty 15

## 2019-06-11 NOTE — Progress Notes (Signed)
PROGRESS NOTE  Holly Conner [id number] DOB: [birth year] DOA: 06/15/2019  PCP: Dion Body, MD  Brief History/Interval Summary: 78 year old with a history of CVA, CAD, dementia, CKD stage IV, DM 2, and hypertension who was sent to the ED from her SNF with dyspnea and hypoxia.  There were clusters of residents from her SNF who are known to have tested positive for Covid.  She was found to be + 10/6.  Because of her respiratory symptoms she was sent to the ED 10/11.  Upon initial assessment by EMS she was found to be saturating in the 70s on room air.  In the ED she was tachypneic into the 30s.  Chest x-ray noted extensive bilateral airspace disease.  Reason for Visit: Acute respiratory failure with hypoxia.  Pneumonia due to COVID-19.  Consultants: None  Procedures: None  Antibiotics: Anti-infectives (From admission, onward)   Start     Dose/Rate Route Frequency Ordered Stop   06/10/19 0000  metroNIDAZOLE (FLAGYL) IVPB 500 mg     500 mg 100 mL/hr over 60 Minutes Intravenous Every 8 hours 06/25/2019 2326     06/11/2019 1600  remdesivir 100 mg in sodium chloride 0.9 % 250 mL IVPB     100 mg 500 mL/hr over 30 Minutes Intravenous Every 24 hours 06/05/2019 0050 06/13/19 1559   05/29/2019 0600  cefTRIAXone (ROCEPHIN) 2 g in sodium chloride 0.9 % 100 mL IVPB     2 g 200 mL/hr over 30 Minutes Intravenous Every 24 hours 06/25/2019 0046 06/14/19 0559   06/17/2019 0130  azithromycin (ZITHROMAX) 500 mg in sodium chloride 0.9 % 250 mL IVPB     500 mg 250 mL/hr over 60 Minutes Intravenous NOW 06/11/2019 0122 06/25/2019 1151   06/04/2019 0100  azithromycin (ZITHROMAX) 500 mg in sodium chloride 0.9 % 250 mL IVPB  Status:  Discontinued     500 mg 250 mL/hr over 60 Minutes Intravenous Daily 06/24/2019 0046 06/10/19 0939      Subjective/Interval History: Patient not very responsive this morning.  Noted to be on a nonrebreather.  Overnight she was taken off of her morphine infusion due to excessive  sedation.    Assessment/Plan:  Acute Hypoxic Resp. Failure/Pneumonia due to COVID-19  COVID-19 Labs  Recent Labs    06/08/19 1307 06/10/19 0115  DDIMER  --  2.91*  FERRITIN  --  266  CRP 8.5* 7.7*    Lab Results  Component Value Date   SARSCOV2NAA POSITIVE (A) 06/08/2019   [place] NEGATIVE 05/15/2019   [place] NEGATIVE 05/11/2019   [place] NEGATIVE 05/02/2019     Fever: She has been afebrile Oxygen requirements: Currently on a nonrebreather at 100% FiO2.  Saturating in the mid 90s. Antibacterials: Ceftriaxone and metronidazole Remdesivir: Day 4 today Steroids: Dexamethasone 6 mg daily Diuretics: Not on a scheduled basis Actemra: None given yet Vitamin C and Zinc: Not given as patient is unable to take orally at this time DVT Prophylaxis: Subcutaneous heparin  Patient's respiratory status is quite poor.  She is requiring nonrebreather saturating in the early 90s.  She is not really mentating well.  Prognosis seems to be poor at this time.  Previous rounding physician has had discussions with patient's power of attorney.  Discussed with power of attorney again today.  They want to give her an additional 24 hours on current treatment.  Considering the patient has been here just about 2 days so this seems to be reasonable.  However they are aware that the prognosis appears to be  poor.  Inflammatory markers are noted to be elevated.  It appears that no blood work was done this morning.  Potassium was noted to be elevated yesterday.  Since prognosis is poor we will hold off on any blood draws at this time.  Chronic kidney disease stage IV with hyperkalemia Baseline creatinine around 2.6.  Worsening creatinine noted yesterday.  Prognosis is poor due to her acute respiratory disease from COVID-19.  We will not be aggressive.  Will not do any blood draws at this time.  Monitor urine output.  Diabetes mellitus type 2, uncontrolled with hyperglycemia HbA1c was 6.0 in  September.  Elevated CBGs most likely due to steroids.  Continue Lantus.  Continue SSI.  Normocytic anemia Most likely due to chronic kidney disease.  Hemoglobin is stable.  No evidence of overt bleeding.  History of coronary artery disease Does not appear to be an active issue currently.  Essential hypertension Monitor blood pressure closely.  History of dementia Currently unresponsive.  The only psychotropic agents that she was on at home were Celexa and Zyprexa.  Goals of care Discussed with patient's power of attorney.  Plan is to continue current care for another 24 hours.  If she does not improve or if she declines then there would be no escalation of care.   Morbid obesity Estimated body mass index is 34.16 kg/m as calculated from the following:   Height as of this encounter: 5\' 1"  (1.549 m).   Weight as of this encounter: 82 kg.   DVT Prophylaxis: Subcutaneous heparin Code Status: DNR Family Communication: Discussed with power of attorney Disposition Plan: Management as outlined above.  Disposition not clear yet.   Medications:  Scheduled: . chlorhexidine  15 mL Mouth Rinse BID  . dexamethasone (DECADRON) injection  6 mg Intravenous Q24H  . heparin  5,000 Units Subcutaneous Q8H  . insulin aspart  0-20 Units Subcutaneous TID WC  . insulin aspart  0-5 Units Subcutaneous QHS  . insulin glargine  14 Units Subcutaneous QHS  . mouth rinse  15 mL Mouth Rinse q12n4p  . sodium chloride flush  3 mL Intravenous Q12H   Continuous: . cefTRIAXone (ROCEPHIN)  IV 2 g (06/11/19 0514)  . metronidazole 500 mg (06/11/19 0913)  . remdesivir 100 mg in NS 250 mL 100 mg (06/10/19 1838)   [id number], albuterol, glycopyrrolate, haloperidol lactate, LORazepam, morphine injection, [DISCONTINUED] ondansetron **OR** ondansetron (ZOFRAN) IV   Objective:  Vital Signs  Vitals:   06/11/19 0100 06/11/19 0400 06/11/19 0500 06/11/19 0800  BP: (!) 91/41 (!) 122/58 (!) 93/41 (!) 119/49   Pulse:  72 66 68  Resp:  15 18 20   Temp: 98.4 F (36.9 C) 98 F (36.7 C) 97.7 F (36.5 C) 97.9 F (36.6 C)  TempSrc: Axillary Axillary Axillary Axillary  SpO2:  98%  98%  Weight:      Height:        Intake/Output Summary (Last 24 hours) at 06/11/2019 1046 Last data filed at 06/11/2019 0514 Gross per 24 hour  Intake 578.94 ml  Output 400 ml  Net 178.94 ml   Filed Weights   06/04/2019 0244  Weight: 82 kg    General appearance: Poorly responsive.  Does not open her eyes despite painful stimuli. Resp: Noted to be tachypneic.  Use of accessory muscles.  Coarse breath sounds bilaterally with crackles at the bases.  No wheezing or rhonchi.   Cardio: S1-S2 is normal regular.  No S3-S4.  No rubs murmurs or bruit GI: Abdomen  is soft.  Nontender nondistended.  Bowel sounds are present normal.  No masses organomegaly Extremities: No edema.   Neurologic: Unresponsive.  No obvious focal deficits noted.   Lab Results:  Data Reviewed: I have personally reviewed following labs and imaging studies  CBC: Recent Labs  Lab 06/08/19 1307 06/01/2019 2202 06/10/19 0650  WBC 6.0  --  12.7*  NEUTROABS 5.2  --  11.6*  HGB 8.1* 8.8* 8.4*  HCT 25.5* 26.0* 27.8*  MCV 90.1  --  95.2  PLT 356  --  0000000    Basic Metabolic Panel: Recent Labs  Lab 06/08/19 1307 06/14/2019 2202 06/10/19 0115  NA 135 136 136  K 4.6 5.0 5.8*  CL 105  --  108  CO2 18*  --  12*  GLUCOSE 117*  --  270*  BUN 52*  --  74*  CREATININE 2.67*  --  3.34*  CALCIUM 8.3*  --  8.5*  MG  --   --  2.6*    GFR: Estimated Creatinine Clearance: 13 mL/min (A) (by C-G formula based on SCr of 3.34 mg/dL (H)).  Liver Function Tests: Recent Labs  Lab 06/08/19 1307 06/10/19 0115  AST 31 60*  ALT 20 20  ALKPHOS 63 68  BILITOT 0.6 0.4  PROT 5.9* 6.2*  ALBUMIN 2.5* 2.5*     CBG: Recent Labs  Lab 06/10/19 0747 06/10/19 1142 06/10/19 1626 06/10/19 2019 06/11/19 0816  GLUCAP 233* 215* 154* 101* 193*      Anemia Panel: Recent Labs    06/10/19 0115  FERRITIN 266    Recent Results (from the past 240 hour(s))  Blood culture (routine x 2)     Status: None (Preliminary result)   Collection Time: 06/08/19  1:38 PM   Specimen: BLOOD  Result Value Ref Range Status   Specimen Description BLOOD L H  Final   Special Requests   Final    BOTTLES DRAWN AEROBIC AND ANAEROBIC Blood Culture adequate volume   Culture   Final    NO GROWTH 3 DAYS Performed at [facility], [address], [place], [place] [address]    Report Status PENDING  Incomplete  Blood culture (routine x 2)     Status: None (Preliminary result)   Collection Time: 06/08/19  1:39 PM   Specimen: BLOOD  Result Value Ref Range Status   Specimen Description BLOOD R H  Final   Special Requests   Final    BOTTLES DRAWN AEROBIC AND ANAEROBIC Blood Culture results may not be optimal due to an inadequate volume of blood received in culture bottles   Culture   Final    NO GROWTH 3 DAYS Performed at [facility], [address], [place], [place] [address]    Report Status PENDING  Incomplete  SARS Coronavirus 2 by RT PCR (hospital order, performed in [facility] hospital lab) Nasopharyngeal Nasopharyngeal Swab     Status: Abnormal   Collection Time: 06/08/19  6:15 PM   Specimen: Nasopharyngeal Swab  Result Value Ref Range Status   SARS Coronavirus 2 POSITIVE (A) NEGATIVE Final    Comment: RESULT CALLED TO, READ BACK BY AND VERIFIED WITH: JEANNETE PEREZ @2044  06/08/19 MJU (NOTE) If result is NEGATIVE SARS-CoV-2 target nucleic acids are NOT DETECTED. The SARS-CoV-2 RNA is generally detectable in upper and lower  respiratory specimens during the acute phase of infection. The lowest  concentration of SARS-CoV-2 viral copies this assay can detect is 250  copies / mL. A  negative result does not preclude SARS-CoV-2 infection  and should not be used as the sole basis for treatment or other  patient management  decisions.  A negative result may occur with  improper specimen collection / handling, submission of specimen other  than nasopharyngeal swab, presence of viral mutation(s) within the  areas targeted by this assay, and inadequate number of viral copies  (<250 copies / mL). A negative result must be combined with clinical  observations, patient history, and epidemiological information. If result is POSITIVE SARS-CoV-2 target nucleic acids are DETECTED. The  SARS-CoV-2 RNA is generally detectable in upper and lower  respiratory specimens during the acute phase of infection.  Positive  results are indicative of active infection with SARS-CoV-2.  Clinical  correlation with patient history and other diagnostic information is  necessary to determine patient infection status.  Positive results do  not rule out bacterial infection or co-infection with other viruses. If result is PRESUMPTIVE POSTIVE SARS-CoV-2 nucleic acids MAY BE PRESENT.   A presumptive positive result was obtained on the submitted specimen  and confirmed on repeat testing.  While 2019 novel coronavirus  (SARS-CoV-2) nucleic acids may be present in the submitted sample  additional confirmatory testing may be necessary for epidemiological  and / or clinical management purposes  to differentiate between  SARS-CoV-2 and other Sarbecovirus currently known to infect humans.  If clinically indicated additional testing with an alternate test  methodology [phone]) is a dvised. The SARS-CoV-2 RNA is generally  detectable in upper and lower respiratory specimens during the acute  phase of infection. The expected result is Negative. Fact Sheet for Patients:  StrictlyIdeas.no Fact Sheet for Healthcare Providers: BankingDealers.co.za This test is not yet approved or cleared by the [place] FDA and has been authorized for detection and/or diagnosis of SARS-CoV-2 by FDA under an  Emergency Use Authorization (EUA).  This EUA will remain in effect (meaning this test can be used) for the duration of the COVID-19 declaration under Section 564(b)(1) of the Act, 21 U.S.C. section 360bbb-3(b)(1), unless the authorization is terminated or revoked sooner. Performed at [facility], [address] [place], [place] [address]       Radiology Studies: Dg Chest [place] 1 View  Result Date: 06/03/2019 CLINICAL DATA:  78 year old female with shortness of breath. COVID-19 positive. EXAM: PORTABLE CHEST 1 VIEW COMPARISON:  Chest radiograph 06/08/2019 FINDINGS: Multifocal airspace opacities involving the right upper lobe, right lower lobe and left mid to lower lung field. The right upper lobe airspace opacity appears new since the prior radiograph. No pneumothorax. No large pleural effusion. Stable cardiomegaly. No acute osseous pathology. IMPRESSION: 1. Multi focal airspace opacities. 2. Right upper lobe airspace opacity appears new since the prior radiograph. Follow-up recommended. Electronically Signed   By: Anner Crete M.D.   On: 06/13/2019 23:19       LOS: 2 days   [facility] Pager on [email or web address]  06/11/2019, 10:46 AM

## 2019-06-11 NOTE — Progress Notes (Signed)
Report received from night shift around 7:30 am- pt is a DNR but with trial bi-pap, currently on 15L HFNC and non-rebreather. Pt is not arousable currently and morphine drip is being held due to excessive lethargy. Pt is have very shallow breathing and appears very ill. MD notified to clarify orders and plan of care. After speaking with attending (and MD speaking with family), the plan is to continue current care for additional 24 hours and possibly progress to comfort after that time. Pt remains on HFNC and non-rebreather. Will continue to monitor resp and neuro statusm and update MD with any changes.

## 2019-06-11 NOTE — TOC Initial Note (Signed)
Transition of Care [facility]) - Initial/Assessment Note    Patient Details  Name: Holly Conner MRN: [id number] Date of Birth: [birth year]  Transition of Care [facility]) CM/SW Contact:    Ninfa Meeker, RN Phone Number: 06/11/2019, 10:28 AM  Clinical Narrative:    78 year old with a history of CVA, CAD, dementia, CKD stage IV, DM 2, and hypertension who was sent to the ED from her SNF with dyspnea and hypoxia.  several residents  from her SNF who are known to have tested positive for Covid. Patient's prognosis is poor and MD has discussed comfort care with  Patient's POA. Plan for now is to continue conservative medical treatment. CM/SW. Will continue to monitor. May she be blessed to recover.          Expected Discharge Plan: [place] Barriers to Discharge: Continued Medical Work up   Patient Goals and CMS Choice Patient states their goals for this hospitalization and ongoing recovery are:: getting back home to [facility].gov Compare Post Acute Care list provided to:: Other (Comment Required)(patient longterm care at facility)    Expected Discharge Plan and Services Expected Discharge Plan: [place]                                              Prior Living Arrangements/Services   Lives with:: Facility Resident   Do you feel safe going back to the place where you live?: Yes               Activities of Daily Living Home Assistive Devices/Equipment: Other (Comment)(pt. from nursing facility) ADL Screening (condition at time of admission) Patient's cognitive ability adequate to safely complete daily activities?: No Is the patient deaf or have difficulty hearing?: No Does the patient have difficulty seeing, even when wearing glasses/contacts?: No Does the patient have difficulty concentrating, remembering, or making decisions?: Yes Patient able to express need for assistance with ADLs?: Yes Does the patient have  difficulty dressing or bathing?: Yes Independently performs ADLs?: No Communication: Independent Dressing (OT): Needs assistance Is this a change from baseline?: Pre-admission baseline Grooming: Needs assistance Is this a change from baseline?: Pre-admission baseline Feeding: Needs assistance Is this a change from baseline?: Pre-admission baseline Bathing: Needs assistance Is this a change from baseline?: Pre-admission baseline Toileting: Needs assistance Is this a change from baseline?: Pre-admission baseline In/Out Bed: Needs assistance Is this a change from baseline?: Pre-admission baseline Walks in Home: Needs assistance Is this a change from baseline?: Pre-admission baseline Does the patient have difficulty walking or climbing stairs?: No Weakness of Legs: None Weakness of Arms/Hands: None  Permission Sought/Granted                  Emotional Assessment           Psych Involvement: No (comment)  Admission diagnosis:  COVID 19 Patient Active Problem List   Diagnosis Date Noted  . Pneumonia due to COVID-19 virus 06/08/2019  . Acute on chronic respiratory failure with hypoxia ([place])   . Multifocal pneumonia   . History of cardioembolic cerebrovascular accident (CVA)   . Vascular dementia with behavior disturbance ([place])   . Acute on chronic systolic congestive heart failure ([place])   . Goals of care, counseling/discussion   . Advanced care planning/counseling discussion   . Palliative care by specialist   . Acute respiratory failure ([place])  05/11/2019  . Acute respiratory failure with hypoxia ([place])   . NSTEMI (non-ST elevated myocardial infarction) ([place]) 05/02/2019  . Memory changes 04/05/2019  . Unresponsive 04/04/2019  . Unresponsive episode 04/03/2019  . Hypoglycemia 04/03/2019  . Elevated troponin 04/03/2019  . Weakness generalized 03/28/2019  . Hypertensive urgency 12/18/2018  . Stroke ([place]) 12/19/2015  . Insulin-requiring or dependent type II diabetes  mellitus ([place]) 12/19/2015  . HTN (hypertension) 12/19/2015  . GERD (gastroesophageal reflux disease) 12/19/2015  . HLD (hyperlipidemia) 12/19/2015  . CKD (chronic kidney disease), stage IV ([place]) 12/19/2015   PCP:  Dion Body, MD Pharmacy:   [facility], [place] to [address] [place] [address] Phone: [phone] Fax: [place] [address] [place] - [place]) [place] [address] Phone: [phone] Fax: [phone]     Social Determinants of Health ([place]) Interventions    Readmission Risk Interventions Readmission Risk Prevention Plan 05/12/2019  Transportation Screening Complete  Medication Review (RN Care Manager) Referral to Pharmacy  PCP or Specialist appointment within 3-5 days of discharge Complete  HRI or Home Care Consult Complete  SW Recovery Care/Counseling Consult Complete  Palliative Care Screening Not [place] Complete  Some recent data might be hidden

## 2019-06-12 DIAGNOSIS — Z515 Encounter for palliative care: Secondary | ICD-10-CM

## 2019-06-12 LAB — GLUCOSE, CAPILLARY
Glucose-Capillary: 162 mg/dL — ABNORMAL HIGH (ref 70–99)
Glucose-Capillary: 271 mg/dL — ABNORMAL HIGH (ref 70–99)

## 2019-06-12 MED ORDER — ONDANSETRON 4 MG PO TBDP: 4.0000 mg | ORAL_TABLET | Freq: Four times a day (QID) | ORAL | Status: DC | PRN

## 2019-06-12 MED ORDER — BIOTENE DRY MOUTH MT LIQD: 15.0000 mL | OROMUCOSAL | Status: DC | PRN

## 2019-06-12 MED ORDER — LORAZEPAM 2 MG/ML PO CONC
1.0000 mg | ORAL | Status: DC | PRN
  Filled 2019-06-12: qty 0.5

## 2019-06-12 MED ORDER — GLYCOPYRROLATE 0.2 MG/ML IJ SOLN: 0.2000 mg | INTRAMUSCULAR | Status: DC | PRN

## 2019-06-12 MED ORDER — HALOPERIDOL LACTATE 5 MG/ML IJ SOLN: 0.5000 mg | INTRAMUSCULAR | Status: DC | PRN

## 2019-06-12 MED ORDER — LORAZEPAM 0.5 MG PO TABS: 1.0000 mg | ORAL_TABLET | ORAL | Status: DC | PRN

## 2019-06-12 MED ORDER — MORPHINE BOLUS VIA INFUSION
2.0000 mg | INTRAVENOUS | Status: DC | PRN
  Filled 2019-06-12: qty 2

## 2019-06-12 MED ORDER — MORPHINE 100MG IN NS 100ML (1MG/ML) PREMIX INFUSION
1.0000 mg/h | INTRAVENOUS | Status: DC
  Administered 2019-06-12: 1 mg/h via INTRAVENOUS
  Filled 2019-06-12: qty 100

## 2019-06-12 MED ORDER — LORAZEPAM 2 MG/ML IJ SOLN: 1.0000 mg | INTRAMUSCULAR | Status: DC | PRN

## 2019-06-12 MED ORDER — ONDANSETRON HCL 4 MG/2ML IJ SOLN: 4.0000 mg | Freq: Four times a day (QID) | INTRAMUSCULAR | Status: DC | PRN

## 2019-06-12 MED ORDER — POLYVINYL ALCOHOL 1.4 % OP SOLN
1.0000 [drp] | Freq: Four times a day (QID) | OPHTHALMIC | Status: DC | PRN
  Filled 2019-06-12: qty 15

## 2019-06-12 MED ORDER — HALOPERIDOL 0.5 MG PO TABS
0.5000 mg | ORAL_TABLET | ORAL | Status: DC | PRN
  Filled 2019-06-12: qty 1

## 2019-06-12 MED ORDER — HALOPERIDOL LACTATE 2 MG/ML PO CONC
0.5000 mg | ORAL | Status: DC | PRN
  Filled 2019-06-12: qty 0.3

## 2019-06-12 MED ORDER — GLYCOPYRROLATE 1 MG PO TABS
1.0000 mg | ORAL_TABLET | ORAL | Status: DC | PRN
  Filled 2019-06-12: qty 1

## 2019-06-13 LAB — CULTURE, BLOOD (ROUTINE X 2)
Culture: NO GROWTH
Culture: NO GROWTH
Special Requests: ADEQUATE

## 2019-06-18 ENCOUNTER — Ambulatory Visit: Payer: Medicare Other | Admitting: Family

## 2019-06-29 NOTE — Progress Notes (Signed)
Spoke with pt roommate Wende Crease, unable to get in touch with Apolonio Schneiders she is at [employer]. Wende Crease is going to contact rachel and let her know about [facility] unable to take body due to COVID 19. Other options provided such as [place], [facility] and they are going to look into [place]. They are to call patient placement by 4pm on 06/13/19 with this decision.

## 2019-06-29 NOTE — Progress Notes (Addendum)
PROGRESS NOTE  Holly Conner [id number] DOB: 1940-10-31 DOA: 06/10/2019  PCP: Dion Body, MD  Brief History/Interval Summary: 78 year old with a history of CVA, CAD, dementia, CKD stage IV, DM 2, and hypertension who was sent to the ED from her SNF with dyspnea and hypoxia.  There were clusters of residents from her SNF who are known to have tested positive for Covid.  She was found to be + 10/6.  Because of her respiratory symptoms she was sent to the ED 10/11.  Upon initial assessment by EMS she was found to be saturating in the 70s on room air.  In the ED she was tachypneic into the 30s.  Chest x-ray noted extensive bilateral airspace disease.  Reason for Visit: Acute respiratory failure with hypoxia.  Pneumonia due to COVID-19.  Consultants: None  Procedures: None  Antibiotics: Anti-infectives (From admission, onward)   Start     Dose/Rate Route Frequency Ordered Stop   06/10/19 0000  metroNIDAZOLE (FLAGYL) IVPB 500 mg  Status:  Discontinued     500 mg 100 mL/hr over 60 Minutes Intravenous Every 8 hours 06/22/2019 2326 2019/06/19 1005   06/03/2019 1600  remdesivir 100 mg in sodium chloride 0.9 % 250 mL IVPB  Status:  Discontinued     100 mg 500 mL/hr over 30 Minutes Intravenous Every 24 hours 06/22/2019 0050 06/19/2019 1005   06/03/2019 0600  cefTRIAXone (ROCEPHIN) 2 g in sodium chloride 0.9 % 100 mL IVPB  Status:  Discontinued     2 g 200 mL/hr over 30 Minutes Intravenous Every 24 hours 06/17/2019 0046 2019/06/19 1005   06/16/2019 0130  azithromycin (ZITHROMAX) 500 mg in sodium chloride 0.9 % 250 mL IVPB     500 mg 250 mL/hr over 60 Minutes Intravenous NOW 06/21/2019 0122 06/21/2019 1151   06/26/2019 0100  azithromycin (ZITHROMAX) 500 mg in sodium chloride 0.9 % 250 mL IVPB  Status:  Discontinued     500 mg 250 mL/hr over 60 Minutes Intravenous Daily 05/29/2019 0046 06/10/19 0939      Subjective/Interval History: Patient with eyes open but appears to be in a lot of discomfort and  distress.  Does not respond to my queries.  Noted to be on a nonrebreather mask.     Assessment/Plan:  Acute Hypoxic Resp. Failure/Pneumonia due to COVID-19 Patient was started on steroids and Remdesivir.  Due to elevated procalcitonin level and concern for aspiration she was also started on ceftriaxone and metronidazole.  Despite treatment for the last 3 days patient has not improved.  She appears to be in discomfort and distress this morning.  She appears to be in pain.  Yesterday discussion was held with patient's power of attorney.  They wanted to wait 24 hours.  Since patient has not progressed discussions were held with Ms. Rose again this morning.  She agrees that patient will likely not survive this illness.  She agrees with transition to comfort care.  End-of-life order set has been initiated including a morphine infusion.  Goals of care As discussed above patient's prognosis is poor.  She will likely not survive this illness.  Anticipate an in-hospital death.  Transitioned to comfort care as discussed above.    Acute on Chronic kidney disease stage IV with hyperkalemia Baseline creatinine around 2.6.  Renal function had worsened here.  Prognosis is poor.  Now transition to comfort care.    Diabetes mellitus type 2, uncontrolled with hyperglycemia HbA1c was 6.0 in September.  Elevated CBGs most likely due to steroids.  Stopped CBGs and insulin administration.  Comfort care.  Normocytic anemia Most likely due to chronic kidney disease.    History of coronary artery disease Essential hypertension History of dementia  Morbid obesity Estimated body mass index is 34.16 kg/m as calculated from the following:   Height as of this encounter: 5\' 1"  (1.549 m).   Weight as of this encounter: 82 kg.   DVT Prophylaxis: None due to comfort measures Code Status: DNR Family Communication: Discussed with power of attorney Disposition Plan: Anticipate in-hospital death   Medications:   Scheduled: . chlorhexidine  15 mL Mouth Rinse BID  . mouth rinse  15 mL Mouth Rinse q12n4p  . sodium chloride flush  3 mL Intravenous Q12H   Continuous: . morphine     [id number], albuterol, antiseptic oral rinse, glycopyrrolate **OR** glycopyrrolate **OR** glycopyrrolate, haloperidol **OR** haloperidol **OR** haloperidol lactate, LORazepam **OR** LORazepam **OR** LORazepam, morphine, ondansetron **OR** ondansetron (ZOFRAN) IV, polyvinyl alcohol   Objective:  Vital Signs  Vitals:   06/11/19 2039 06/11/19 2042 06-29-19 0100 2019-06-29 0610  BP: (!) 135/51  (!) 127/51 123/72  Pulse: 79 74 89 89  Resp: (!) 31 (!) 27 (!) 27 (!) 26  Temp: 99.3 F (37.4 C)  98 F (36.7 C) 98.9 F (37.2 C)  TempSrc: Axillary  Axillary Axillary  SpO2: 94% 95% 94% 95%  Weight:      Height:        Intake/Output Summary (Last 24 hours) at 06/29/19 1006 Last data filed at Jun 29, 2019 0600 Gross per 24 hour  Intake 300 ml  Output 450 ml  Net -150 ml   Filed Weights   06/15/2019 0244  Weight: 82 kg   General appearance: Poorly responsive.  Appears to be in distress. Resp: Tachypneic.  Use of accessory muscles noted.  Crackles bilaterally.  Poor air entry.   Cardio: S1-S2 is normal regular.  No S3-S4.  No rubs murmurs or bruit GI: Abdomen is soft.  Nontender nondistended.  Bowel sounds are present normal.  No masses organomegaly    Lab Results:  Data Reviewed: I have personally reviewed following labs and imaging studies  CBC: Recent Labs  Lab 06/08/19 1307 05/31/2019 2202 06/10/19 0650  WBC 6.0  --  12.7*  NEUTROABS 5.2  --  11.6*  HGB 8.1* 8.8* 8.4*  HCT 25.5* 26.0* 27.8*  MCV 90.1  --  95.2  PLT 356  --  0000000    Basic Metabolic Panel: Recent Labs  Lab 06/08/19 1307 06/08/2019 2202 06/10/19 0115  NA 135 136 136  K 4.6 5.0 5.8*  CL 105  --  108  CO2 18*  --  12*  GLUCOSE 117*  --  270*  BUN 52*  --  74*  CREATININE 2.67*  --  3.34*  CALCIUM 8.3*  --  8.5*  MG  --   --   2.6*    GFR: Estimated Creatinine Clearance: 13 mL/min (A) (by C-G formula based on SCr of 3.34 mg/dL (H)).  Liver Function Tests: Recent Labs  Lab 06/08/19 1307 06/10/19 0115  AST 31 60*  ALT 20 20  ALKPHOS 63 68  BILITOT 0.6 0.4  PROT 5.9* 6.2*  ALBUMIN 2.5* 2.5*     CBG: Recent Labs  Lab 06/11/19 0816 06/11/19 1203 06/11/19 1613 06/11/19 1935 06/29/2019 0757  GLUCAP 193* 209* 172* 143* 271*     Anemia Panel: Recent Labs    06/10/19 0115  FERRITIN 266    Recent Results (from the past 240  hour(s))  Blood culture (routine x 2)     Status: None (Preliminary result)   Collection Time: 06/08/19  1:38 PM   Specimen: BLOOD  Result Value Ref Range Status   Specimen Description BLOOD L H  Final   Special Requests   Final    BOTTLES DRAWN AEROBIC AND ANAEROBIC Blood Culture adequate volume   Culture   Final    NO GROWTH 4 DAYS Performed at [facility], [address] [place], [place] [address]    Report Status PENDING  Incomplete  Blood culture (routine x 2)     Status: None (Preliminary result)   Collection Time: 06/08/19  1:39 PM   Specimen: BLOOD  Result Value Ref Range Status   Specimen Description BLOOD R H  Final   Special Requests   Final    BOTTLES DRAWN AEROBIC AND ANAEROBIC Blood Culture results may not be optimal due to an inadequate volume of blood received in culture bottles   Culture   Final    NO GROWTH 4 DAYS Performed at [facility], [address] [place], [place] [address]    Report Status PENDING  Incomplete  SARS Coronavirus 2 by RT PCR (hospital order, performed in [place] hospital lab) Nasopharyngeal Nasopharyngeal Swab     Status: Abnormal   Collection Time: 06/08/19  6:15 PM   Specimen: Nasopharyngeal Swab  Result Value Ref Range Status   SARS Coronavirus 2 POSITIVE (A) NEGATIVE Final    Comment: RESULT CALLED TO, READ BACK BY AND VERIFIED WITH: JEANNETE PEREZ @2044  06/08/19 MJU (NOTE) If result is  NEGATIVE SARS-CoV-2 target nucleic acids are NOT DETECTED. The SARS-CoV-2 RNA is generally detectable in upper and lower  respiratory specimens during the acute phase of infection. The lowest  concentration of SARS-CoV-2 viral copies this assay can detect is 250  copies / mL. A negative result does not preclude SARS-CoV-2 infection  and should not be used as the sole basis for treatment or other  patient management decisions.  A negative result may occur with  improper specimen collection / handling, submission of specimen other  than nasopharyngeal swab, presence of viral mutation(s) within the  areas targeted by this assay, and inadequate number of viral copies  (<250 copies / mL). A negative result must be combined with clinical  observations, patient history, and epidemiological information. If result is POSITIVE SARS-CoV-2 target nucleic acids are DETECTED. The  SARS-CoV-2 RNA is generally detectable in upper and lower  respiratory specimens during the acute phase of infection.  Positive  results are indicative of active infection with SARS-CoV-2.  Clinical  correlation with patient history and other diagnostic information is  necessary to determine patient infection status.  Positive results do  not rule out bacterial infection or co-infection with other viruses. If result is PRESUMPTIVE POSTIVE SARS-CoV-2 nucleic acids MAY BE PRESENT.   A presumptive positive result was obtained on the submitted specimen  and confirmed on repeat testing.  While 2019 novel coronavirus  (SARS-CoV-2) nucleic acids may be present in the submitted sample  additional confirmatory testing may be necessary for epidemiological  and / or clinical management purposes  to differentiate between  SARS-CoV-2 and other Sarbecovirus currently known to infect humans.  If clinically indicated additional testing with an alternate test  methodology [phone]) is a dvised. The SARS-CoV-2 RNA is generally  detectable  in upper and lower respiratory specimens during the acute  phase of infection. The expected result is Negative. Fact Sheet  for Patients:  StrictlyIdeas.no Fact Sheet for Healthcare Providers: BankingDealers.co.za This test is not yet approved or cleared by the [place] FDA and has been authorized for detection and/or diagnosis of SARS-CoV-2 by FDA under an Emergency Use Authorization (EUA).  This EUA will remain in effect (meaning this test can be used) for the duration of the COVID-19 declaration under Section 564(b)(1) of the Act, 21 U.S.C. section 360bbb-3(b)(1), unless the authorization is terminated or revoked sooner. Performed at [facility], [address], [place],  [address]       Radiology Studies: No results found.     LOS: 3 days   Holly Conner [employer] on [email or web address]  09-Jul-2019, 10:06 AM

## 2019-06-29 NOTE — Death Summary Note (Addendum)
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 There were clusters of residents from her SNF who are known to have tested positive for Covid. She was found to be + 10/6. Because of her respiratory symptoms she was sent to the ED 10/11. Upon initial assessment by EMS she was found to be saturating in the 70s on room air. In the ED she was tachypneic into the 30s. Chest x-ray noted extensive bilateral airspace disease.  Hospital Course  Acute Hypoxic Resp. Failure/Pneumonia due to COVID-19 Patient was hospitalized.  She was started on steroids and Remdesivir.  Due to elevated procalcitonin level and concern for aspiration she was also started on ceftriaxone and metronidazole.  Despite treatment for the last 3 days patient has not improved.  She is requiring oxygen by a nonrebreather mask.  She was noted to be in a lot of  discomfort.  She was poorly responsive for the most part.  Discussions were held with patient's power of attorney.  Due to lack of improvement and poor baseline prognosis and multiple comorbidities it was felt that transition to comfort care would be the ideal thing to do for this patient.  Patient was transitioned to comfort care.  She was started on a morphine infusion.  She subsequently expired on 06/13/19 at 2:20 PM.  Acute on chronic kidney disease stage IV with hyperkalemia Baseline creatinine around 2.6.  Renal function had worsened here.     Diabetes mellitus type 2, uncontrolled with hyperglycemia HbA1c was 6.0 in September.  Elevated CBGs most likely due to steroids.    Normocytic anemia Most likely due to chronic kidney disease.    History of coronary artery disease Essential hypertension History of dementia  Morbid obesity Estimated body mass index is 34.16 kg/m as calculated from the following:   Height as of this encounter: 5\' 1"  (1.549 m).   Weight as of this encounter: 82 kg.       Pertinent Labs and Studies  Significant Diagnostic Studies Dg Chest Port 1 View  Result Date: 10-Jun-2019 CLINICAL DATA:  78 year old female with shortness of breath. COVID-19 positive. EXAM: PORTABLE CHEST 1 VIEW COMPARISON:  Chest radiograph 06/08/2019 FINDINGS: Multifocal airspace opacities involving the right upper lobe, right lower lobe and left mid to lower lung field. The right upper lobe airspace opacity appears new since the prior radiograph. No pneumothorax. No large pleural effusion. Stable cardiomegaly. No acute osseous pathology. IMPRESSION: 1. Multi focal airspace opacities. 2. Right upper lobe airspace opacity appears new since the prior  radiograph. Follow-up recommended. Electronically Signed   By: Anner Crete M.D.   On: 06/01/2019 23:19   Dg Chest Portable 1 View  Result Date: 06/08/2019 CLINICAL DATA:  Worsening shortness of breath in a COVID-19 positive  patient. EXAM: PORTABLE CHEST 1 VIEW COMPARISON:  Single-view of the chest 05/11/2019 and 05/04/2019. FINDINGS: The patient has extensive bilateral airspace disease which is worse on left. Aeration in the upper lung zones has mildly improved since the most recent exam. There is cardiomegaly. No pneumothorax or pleural fluid. Atherosclerosis. IMPRESSION: Extensive bilateral airspace disease is worse on the left. Aeration in the upper lung zones is somewhat improved since the most recent exam. Cardiomegaly. Atherosclerosis. Electronically Signed   By: Inge Rise M.D.   On: 06/08/2019 14:01    Microbiology Recent Results (from the past 240 hour(s))  Blood culture (routine x 2)     Status: None (Preliminary result)   Collection Time: 06/08/19  1:38 PM   Specimen: BLOOD  Result Value Ref Range Status   Specimen Description BLOOD L H  Final   Special Requests   Final    BOTTLES DRAWN AEROBIC AND ANAEROBIC Blood Culture adequate volume   Culture   Final    NO GROWTH 4 DAYS Performed at [facility], [address] [place], [place] [address]    Report Status PENDING  Incomplete  Blood culture (routine x 2)     Status: None (Preliminary result)   Collection Time: 06/08/19  1:39 PM   Specimen: BLOOD  Result Value Ref Range Status   Specimen Description BLOOD R H  Final   Special Requests   Final    BOTTLES DRAWN AEROBIC AND ANAEROBIC Blood Culture results may not be optimal due to an inadequate volume of blood received in culture bottles   Culture   Final    NO GROWTH 4 DAYS Performed at [facility], [address] [place], [place] [address]    Report Status PENDING  Incomplete  SARS Coronavirus 2 by RT PCR (hospital order, performed in [facility] hospital lab) Nasopharyngeal Nasopharyngeal Swab     Status: Abnormal   Collection Time: 06/08/19  6:15 PM   Specimen: Nasopharyngeal Swab  Result Value Ref Range Status   SARS Coronavirus 2 POSITIVE (A) NEGATIVE Final     Comment: RESULT CALLED TO, READ BACK BY AND VERIFIED WITH: JEANNETE PEREZ @2044  06/08/19 MJU (NOTE) If result is NEGATIVE SARS-CoV-2 target nucleic acids are NOT DETECTED. The SARS-CoV-2 RNA is generally detectable in upper and lower  respiratory specimens during the acute phase of infection. The lowest  concentration of SARS-CoV-2 viral copies this assay can detect is 250  copies / mL. A negative result does not preclude SARS-CoV-2 infection  and should not be used as the sole basis for treatment or other  patient management decisions.  A negative result may occur with  improper specimen collection / handling, submission of specimen other  than nasopharyngeal swab, presence of viral mutation(s) within the  areas targeted by this assay, and inadequate number of viral copies  (<250 copies / mL). A negative result must be combined with clinical  observations, patient history, and epidemiological information. If result is POSITIVE SARS-CoV-2 target nucleic acids are DETECTED. The  SARS-CoV-2 RNA is generally detectable in upper and lower  respiratory specimens during the acute phase of infection.  Positive  results are indicative of active infection with SARS-CoV-2.  Clinical  correlation with patient history and other diagnostic information is  necessary to determine patient infection status.  Positive results do  not rule out bacterial infection or co-infection with other viruses. If result is PRESUMPTIVE POSTIVE SARS-CoV-2 nucleic acids MAY BE PRESENT.   A presumptive positive result was obtained on the submitted specimen  and confirmed on repeat testing.  While 2019 novel coronavirus  (SARS-CoV-2) nucleic acids may be present in the submitted sample  additional confirmatory testing may be necessary for epidemiological  and / or clinical management purposes  to differentiate between  SARS-CoV-2 and other Sarbecovirus currently known to infect humans.  If clinically indicated  additional testing with an alternate test  methodology [phone]) is a dvised. The SARS-CoV-2 RNA is generally  detectable in upper and lower respiratory specimens during the acute  phase of infection. The expected result is Negative. Fact Sheet for Patients:  StrictlyIdeas.no Fact Sheet for Healthcare Providers: BankingDealers.co.za This test is not yet approved or cleared by the [place] FDA and has been authorized for detection and/or diagnosis of SARS-CoV-2 by FDA under an Emergency Use Authorization (EUA).  This EUA will remain in effect (meaning this test can be used) for the duration of the COVID-19 declaration under Section 564(b)(1) of the Act, 21 U.S.C. section 360bbb-3(b)(1), unless the authorization is terminated or revoked sooner. Performed at [facility], [address] [place], [place] [address]     Lab  COVID-19 Labs  Recent Labs    06/10/19 0115  DDIMER 2.91*  FERRITIN 266  CRP 7.7*    Lab Results  Component Value Date   SARSCOV2NAA POSITIVE (A) 06/08/2019   [place] NEGATIVE 05/15/2019   [place] NEGATIVE 05/11/2019   [place] NEGATIVE 05/02/2019     Basic Metabolic Panel: Recent Labs  Lab 06/08/19 1307 06/05/2019 2202 06/10/19 0115  NA 135 136 136  K 4.6 5.0 5.8*  CL 105  --  108  CO2 18*  --  12*  GLUCOSE 117*  --  270*  BUN 52*  --  74*  CREATININE 2.67*  --  3.34*  CALCIUM 8.3*  --  8.5*  MG  --   --  2.6*   Liver Function Tests: Recent Labs  Lab 06/08/19 1307 06/10/19 0115  AST 31 60*  ALT 20 20  ALKPHOS 63 68  BILITOT 0.6 0.4  PROT 5.9* 6.2*  ALBUMIN 2.5* 2.5*   CBC: Recent Labs  Lab 06/08/19 1307 06/25/2019 2202 06/10/19 0650  WBC 6.0  --  12.7*  NEUTROABS 5.2  --  11.6*  HGB 8.1* 8.8* 8.4*  HCT 25.5* 26.0* 27.8*  MCV 90.1  --  95.2  PLT 356  --  369   Sepsis Labs: Recent Labs  Lab 06/08/19 1307 06/10/19 0650  PROCALCITON 6.86  --   WBC  6.0 12.7*     Cloria Ciresi 06-25-2019, 2:25 PM

## 2019-06-29 NOTE — Progress Notes (Signed)
Pt time of death 1420.  Notified HCPOA/guardian, [name], @ [phone].  When asked about funeral home was told pt had pre-registered with [employer] [facility] to be donated at time of death.  Ms. Kalman Shan provided the number - [phone] and said she would notify the rest of the family.   Call made to [employer] ~1650, reception confirmed "pre-registration".  Stated being COVID positive disqualified pt from the program. S/w Jenny Reichmann, charge RN and was told family had to make arrangements and advise Patient Placement at [phone] within 24hrs.  Suggestions were to try and contact [place] or [facility]; additionally could try [employer], frequently used by hospice for [employer].  Multiple attempts to reach [facility] but line remains busy.  Will continue to try.

## 2019-06-29 NOTE — Progress Notes (Signed)
Pt. MEWS score is 4, will continue to monitor, MD notified. Pt  Medicated with 2mg  of Morphine to add in breathing as the pt. Using accessory muscles and continues to remove non rebreather causing O2 to drop.

## 2019-06-29 DEATH — deceased

## 2020-07-20 IMAGING — CR PORTABLE CHEST - 1 VIEW
1 series · 2 of 2 positions shown · non-contrast
Comparison: 03/27/2019

CLINICAL DATA: Fall.  Shortness of breath.

EXAM:
PORTABLE CHEST 1 VIEW

[Series 1: dg chest port 1 view · 0.14mm/px · 2 of 2 slices shown]
[im 1/2]
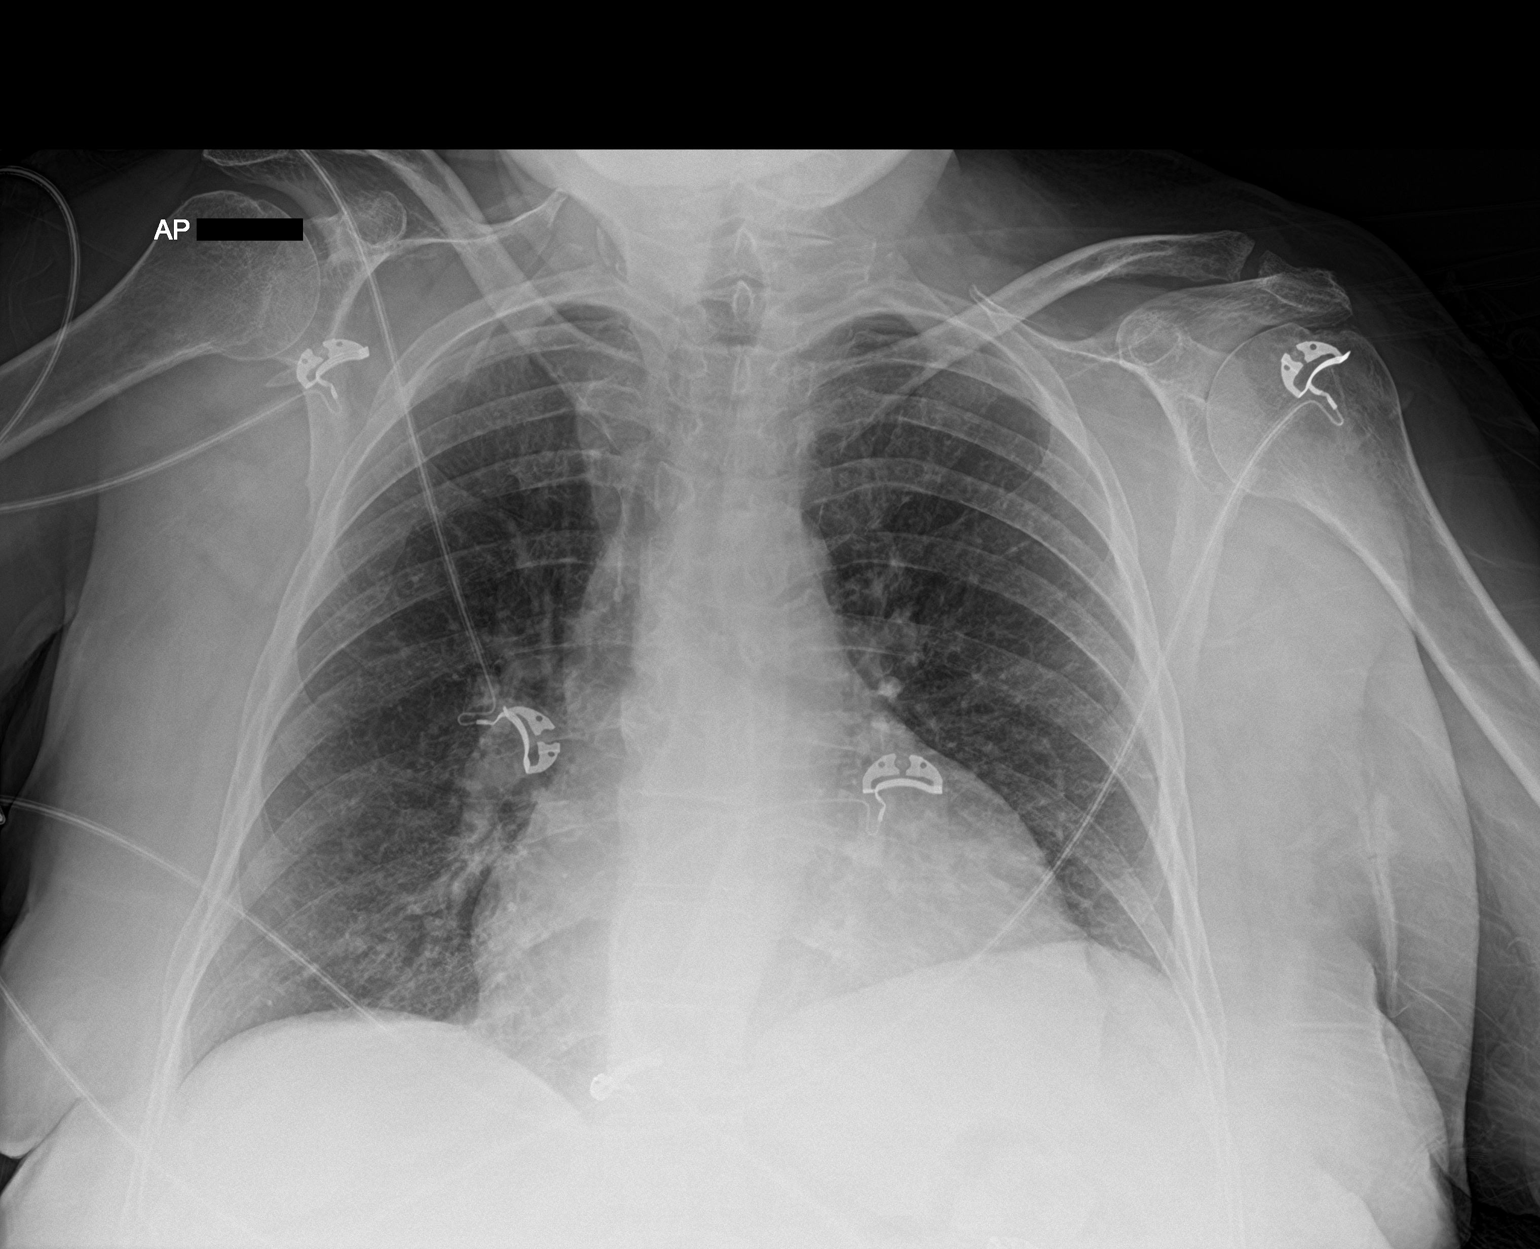
[im 2/2]
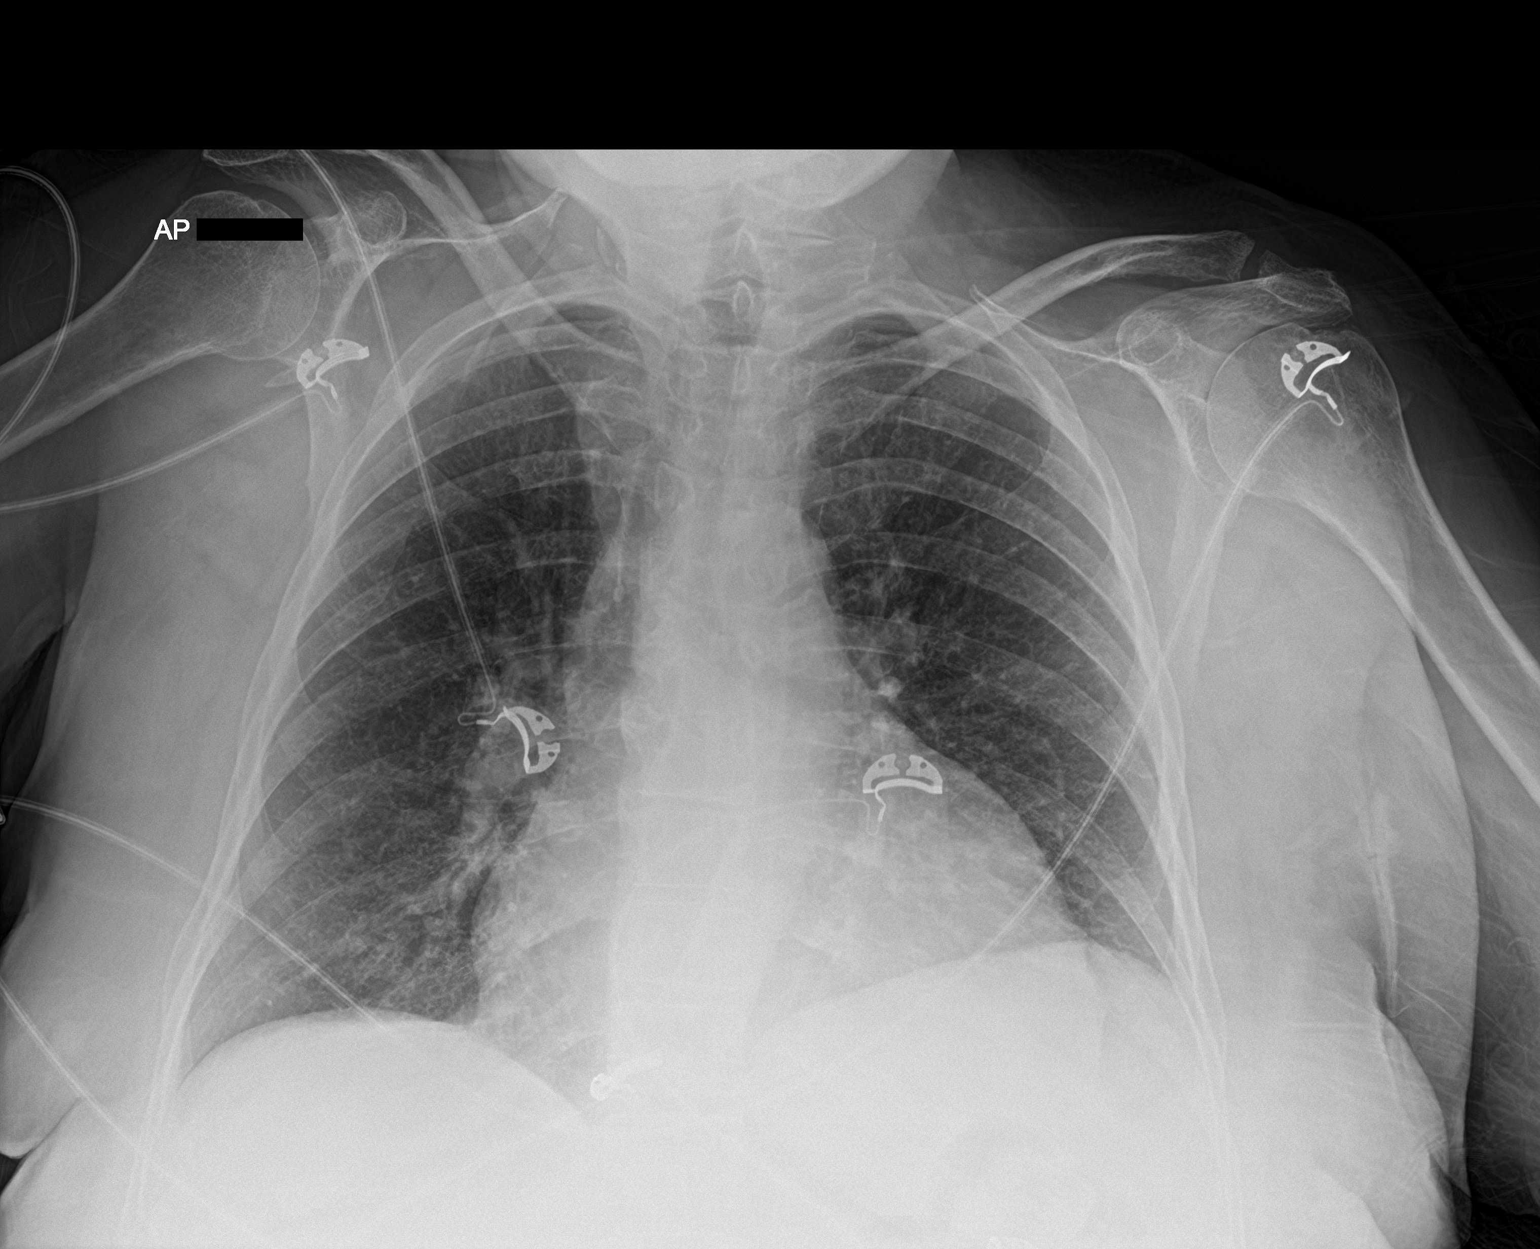

[2 of 2 positions shown; findings below may reference images not displayed]

FINDINGS: Mild cardiomegaly. Lungs clear. No effusions. No acute bony
abnormality.
IMPRESSION: Mild cardiomegaly.  No active disease.

## 2020-08-18 IMAGING — CT CT CHEST W/O CM
2 of 3 series · 15 of 36 positions shown, 18 images · non-contrast
Comparison: 12/21/2012

CLINICAL DATA: Chest pain

EXAM:
CT CHEST WITHOUT CONTRAST
TECHNIQUE: Multidetector CT imaging of the chest was performed following the
standard protocol without IV contrast.

[Series 2: thorax · axial · 0.60mm/px · z∈[-1015,-775]mm · 12 of 142 slices shown, 15 images]
[im 11/142  mediastinal]
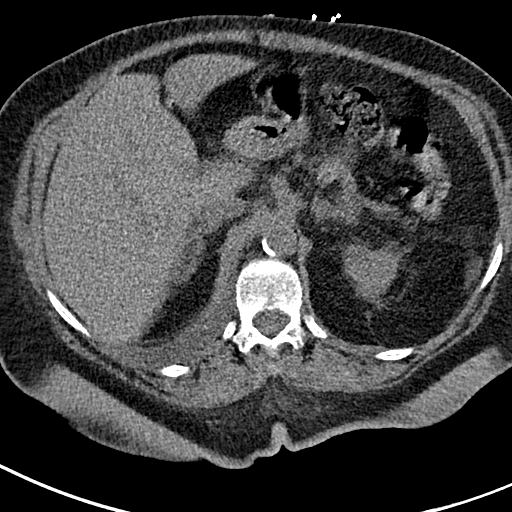
[im 11/142  lung]
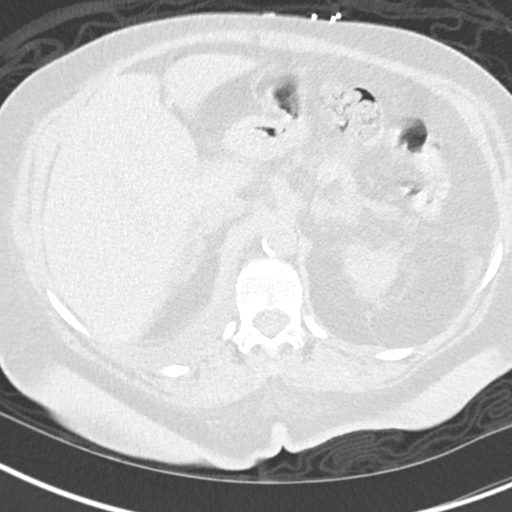
[im 21/142  lung]
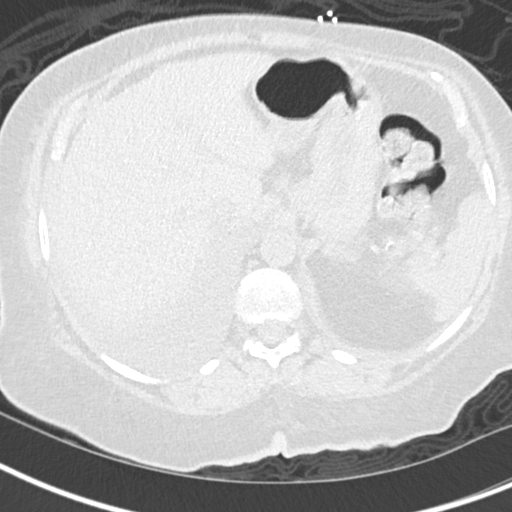
[im 32/142  lung]
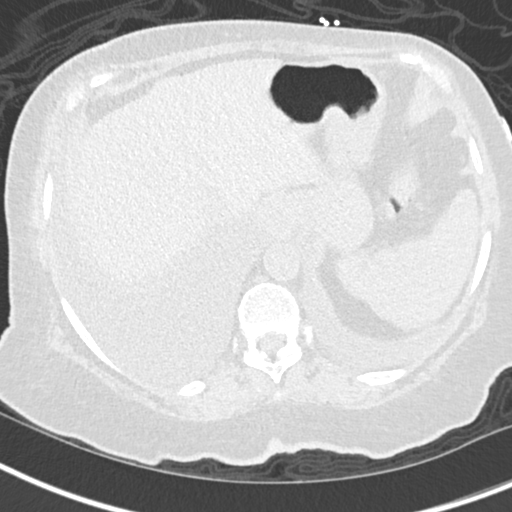
[im 42/142  lung]
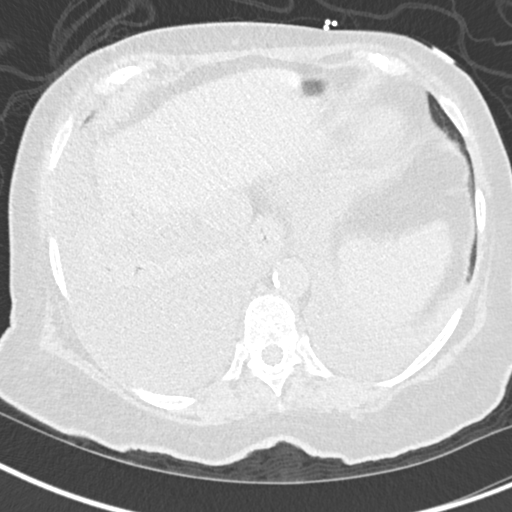
[im 53/142  mediastinal]
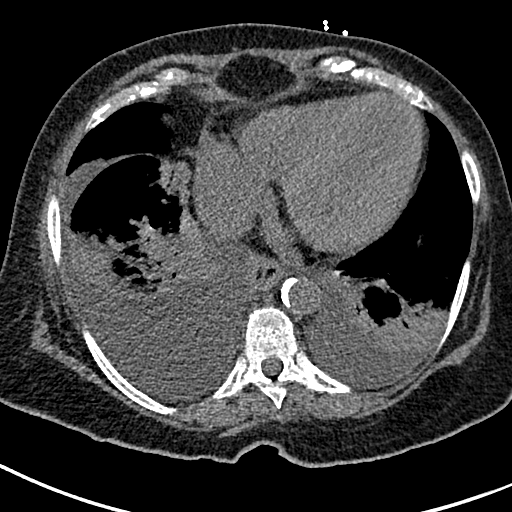
[im 53/142  lung]
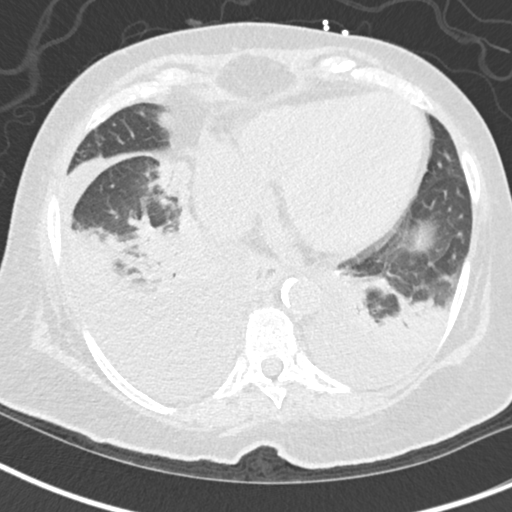
[im 63/142  lung]
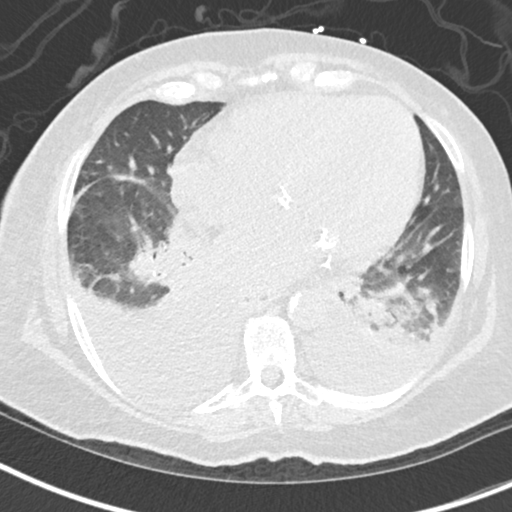
[im 79/142  lung]
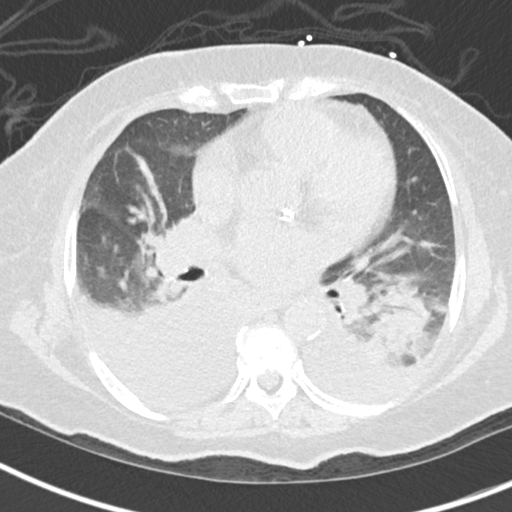
[im 89/142  lung]
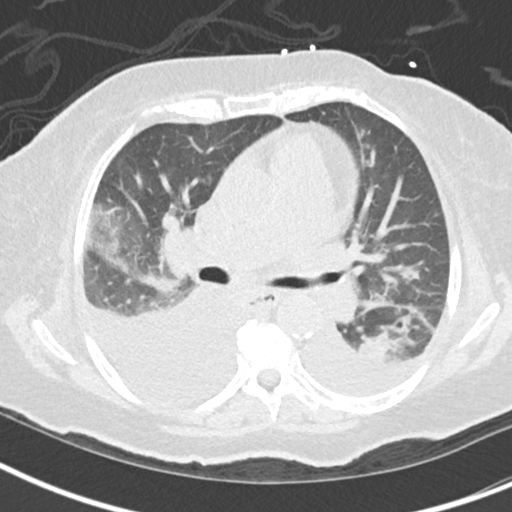
[im 100/142  mediastinal]
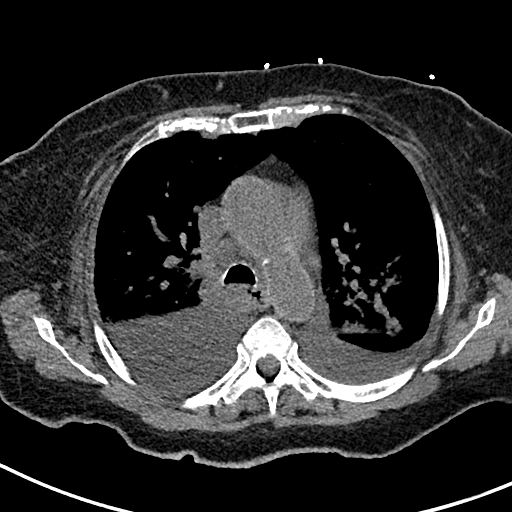
[im 100/142  lung]
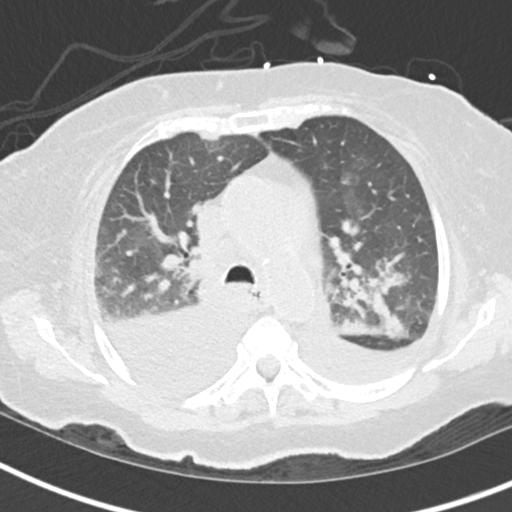
[im 110/142  lung]
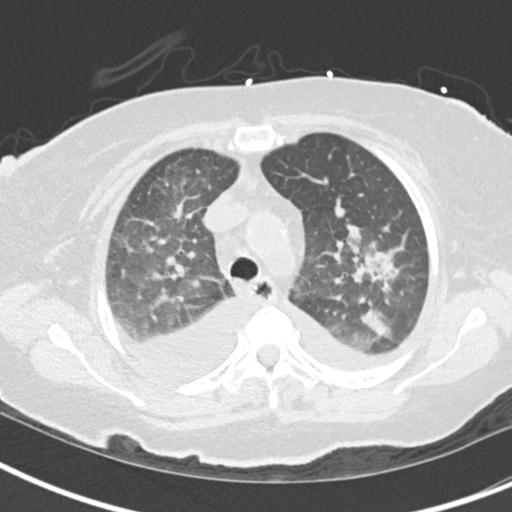
[im 121/142  lung]
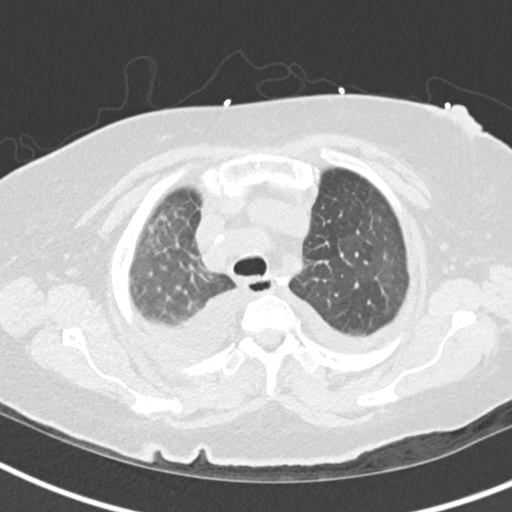
[im 131/142  lung]
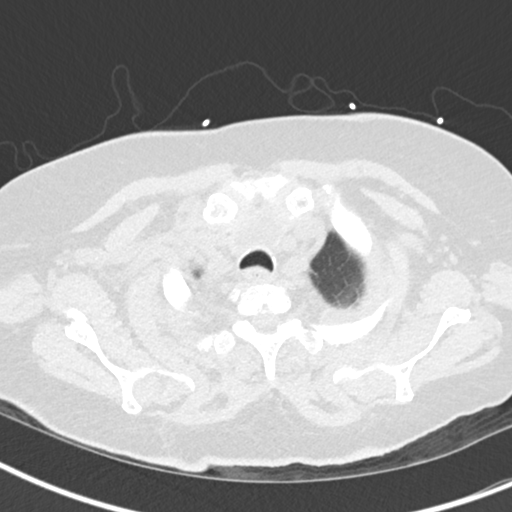

[Series 5: coronal · coronal · 0.60mm/px · 3 of 127 slices shown]
[im 26/127  lung]
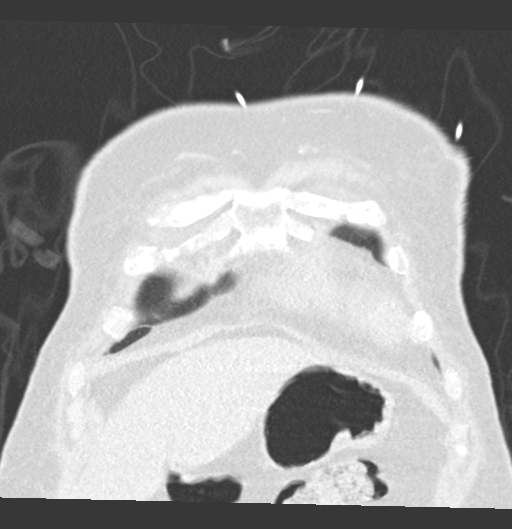
[im 51/127  lung]
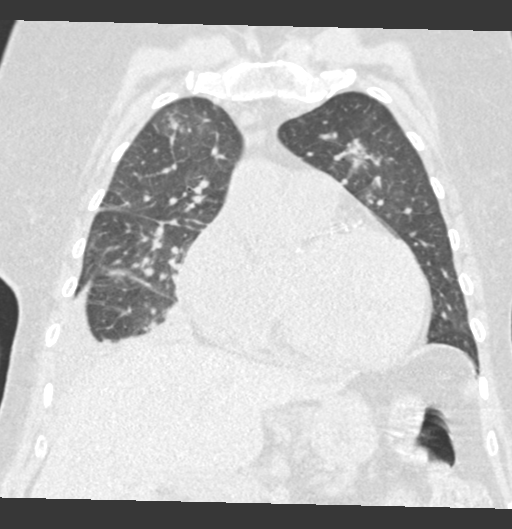
[im 76/127  lung]
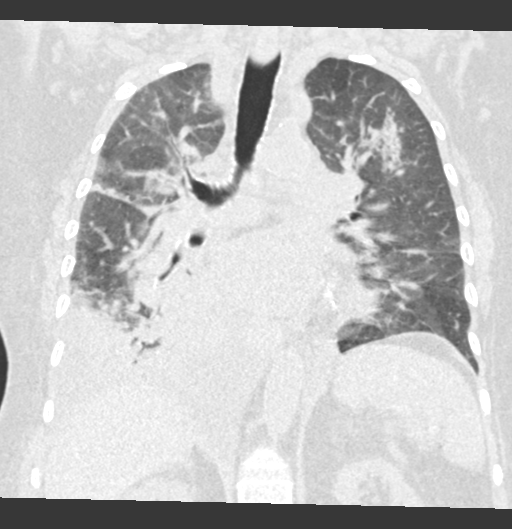

[15 of 36 positions shown; findings below may reference images not displayed]

FINDINGS: Cardiovascular: Heart is enlarged. Diffuse coronary artery and
aortic calcifications. No evidence of aortic aneurysm.

Mediastinum/Nodes: Borderline scattered mediastinal lymph nodes. Low
right paratracheal lymph node has a short axis diameter of 11 mm.
Other similarly sized or smaller scattered mediastinal lymph nodes.
No visible hilar adenopathy. No axial adenopathy. Isthmic thyroid
nodule measures 2.4 by 2.0 cm. This has enlarged slightly since 0799
study when this measured 2.0 x 1.6 cm.

Lungs/Pleura: They are are large bilateral pleural effusions.
Extensive airspace disease in both lungs, most confluent in the
lower lobes but also noted in both upper lobes compatible with
multifocal pneumonia. Background of interstitial thickening and
ground-glass opacities could reflect underlying edema.

Upper Abdomen: Imaging into the upper abdomen shows no acute
findings.

Musculoskeletal: Chest wall soft tissues are unremarkable. No acute
bony abnormality.
IMPRESSION: Large bilateral pleural effusions.

Cardiomegaly.  Diffuse coronary artery disease.

Bilateral airspace opacities, most confluent in the lower lobes but
also patchy opacities in the upper lobes most compatible with
multifocal pneumonia.

There may be a component of pulmonary edema in the background.

Borderline sized mediastinal lymph nodes, likely reactive.

Aortic Atherosclerosis ([id number].T).

## 2021-06-17 ENCOUNTER — Other Ambulatory Visit (HOSPITAL_BASED_OUTPATIENT_CLINIC_OR_DEPARTMENT_OTHER): Payer: Self-pay
